# Patient Record
Sex: Male | Born: 1955
Health system: Southern US, Community
[De-identification: ages and names within clinical notes are randomized; demographics above are authoritative.]

## PROBLEM LIST (undated history)

## (undated) DIAGNOSIS — I2129 ST elevation (STEMI) myocardial infarction involving other sites: Secondary | ICD-10-CM

## (undated) DIAGNOSIS — I6529 Occlusion and stenosis of unspecified carotid artery: Secondary | ICD-10-CM

## (undated) DIAGNOSIS — I255 Ischemic cardiomyopathy: Secondary | ICD-10-CM

## (undated) DIAGNOSIS — E785 Hyperlipidemia, unspecified: Secondary | ICD-10-CM

## (undated) DIAGNOSIS — I2510 Atherosclerotic heart disease of native coronary artery without angina pectoris: Secondary | ICD-10-CM

## (undated) DIAGNOSIS — E119 Type 2 diabetes mellitus without complications: Secondary | ICD-10-CM

## (undated) DIAGNOSIS — I10 Essential (primary) hypertension: Secondary | ICD-10-CM

## (undated) DIAGNOSIS — I5042 Chronic combined systolic (congestive) and diastolic (congestive) heart failure: Secondary | ICD-10-CM

## (undated) HISTORY — DX: Ischemic cardiomyopathy: I25.5

## (undated) HISTORY — DX: Chronic combined systolic (congestive) and diastolic (congestive) heart failure: I50.42

## (undated) HISTORY — DX: Hyperlipidemia, unspecified: E78.5

## (undated) HISTORY — DX: Occlusion and stenosis of unspecified carotid artery: I65.29

## (undated) HISTORY — DX: Atherosclerotic heart disease of native coronary artery without angina pectoris: I25.10

---

## 2014-03-23 ENCOUNTER — Encounter (HOSPITAL_COMMUNITY)
Admission: EM | Disposition: A | Discharge status: Home / Self Care | Payer: Self-pay | Source: Ambulatory Visit | Attending: Cardiovascular Disease

## 2014-03-23 ENCOUNTER — Emergency Department: Payer: Self-pay | Admitting: Emergency Medicine

## 2014-03-23 ENCOUNTER — Encounter (HOSPITAL_COMMUNITY): Payer: Self-pay | Admitting: Cardiovascular Disease

## 2014-03-23 ENCOUNTER — Inpatient Hospital Stay (HOSPITAL_COMMUNITY)
Admission: EM | Admit: 2014-03-23 | Discharge: 2014-04-01 | DRG: 233 | Disposition: A | Discharge status: Home / Self Care | Payer: No Typology Code available for payment source | Source: Ambulatory Visit | Attending: Thoracic Surgery (Cardiothoracic Vascular Surgery) | Admitting: Thoracic Surgery (Cardiothoracic Vascular Surgery)

## 2014-03-23 DIAGNOSIS — D62 Acute posthemorrhagic anemia: Secondary | ICD-10-CM | POA: Diagnosis not present

## 2014-03-23 DIAGNOSIS — I5041 Acute combined systolic (congestive) and diastolic (congestive) heart failure: Secondary | ICD-10-CM | POA: Diagnosis present

## 2014-03-23 DIAGNOSIS — R0902 Hypoxemia: Secondary | ICD-10-CM | POA: Diagnosis present

## 2014-03-23 DIAGNOSIS — E119 Type 2 diabetes mellitus without complications: Secondary | ICD-10-CM

## 2014-03-23 DIAGNOSIS — Z794 Long term (current) use of insulin: Secondary | ICD-10-CM

## 2014-03-23 DIAGNOSIS — R29898 Other symptoms and signs involving the musculoskeletal system: Secondary | ICD-10-CM

## 2014-03-23 DIAGNOSIS — I2510 Atherosclerotic heart disease of native coronary artery without angina pectoris: Secondary | ICD-10-CM

## 2014-03-23 DIAGNOSIS — I2129 ST elevation (STEMI) myocardial infarction involving other sites: Secondary | ICD-10-CM | POA: Diagnosis present

## 2014-03-23 DIAGNOSIS — E1165 Type 2 diabetes mellitus with hyperglycemia: Secondary | ICD-10-CM | POA: Diagnosis present

## 2014-03-23 DIAGNOSIS — E871 Hypo-osmolality and hyponatremia: Secondary | ICD-10-CM | POA: Diagnosis present

## 2014-03-23 DIAGNOSIS — R05 Cough: Secondary | ICD-10-CM

## 2014-03-23 DIAGNOSIS — J9811 Atelectasis: Secondary | ICD-10-CM | POA: Diagnosis not present

## 2014-03-23 DIAGNOSIS — R0789 Other chest pain: Secondary | ICD-10-CM | POA: Diagnosis present

## 2014-03-23 DIAGNOSIS — I25110 Atherosclerotic heart disease of native coronary artery with unstable angina pectoris: Secondary | ICD-10-CM

## 2014-03-23 DIAGNOSIS — I237 Postinfarction angina: Secondary | ICD-10-CM | POA: Diagnosis not present

## 2014-03-23 DIAGNOSIS — I25118 Atherosclerotic heart disease of native coronary artery with other forms of angina pectoris: Secondary | ICD-10-CM | POA: Diagnosis present

## 2014-03-23 DIAGNOSIS — R059 Cough, unspecified: Secondary | ICD-10-CM

## 2014-03-23 DIAGNOSIS — Z951 Presence of aortocoronary bypass graft: Secondary | ICD-10-CM

## 2014-03-23 DIAGNOSIS — I309 Acute pericarditis, unspecified: Secondary | ICD-10-CM | POA: Diagnosis present

## 2014-03-23 DIAGNOSIS — I219 Acute myocardial infarction, unspecified: Secondary | ICD-10-CM

## 2014-03-23 DIAGNOSIS — R072 Precordial pain: Secondary | ICD-10-CM

## 2014-03-23 DIAGNOSIS — I252 Old myocardial infarction: Secondary | ICD-10-CM | POA: Diagnosis present

## 2014-03-23 HISTORY — DX: ST elevation (STEMI) myocardial infarction involving other sites: I21.29

## 2014-03-23 HISTORY — DX: Type 2 diabetes mellitus without complications: E11.9

## 2014-03-23 HISTORY — PX: LEFT HEART CATHETERIZATION WITH CORONARY ANGIOGRAM: SHX5451

## 2014-03-23 LAB — TROPONIN I
Troponin I: 6.78 ng/mL (ref <0.031)
Troponin I: 9.05 ng/mL (ref <0.031)
Troponin I: 8.68 ng/mL (ref <0.031)
Troponin-I: 11 ng/mL — ABNORMAL HIGH

## 2014-03-23 LAB — CBC
HCT: 44.1 % (ref 40.0–52.0)
HCT: 38 % — ABNORMAL LOW (ref 39.0–52.0)
HGB: 14.7 g/dL (ref 13.0–18.0)
Hemoglobin: 13.6 g/dL (ref 13.0–17.0)
MCH: 29.1 pg (ref 26.0–34.0)
MCH: 30 pg (ref 26.0–34.0)
MCHC: 33.3 g/dL (ref 32.0–36.0)
MCHC: 35.8 g/dL (ref 30.0–36.0)
MCV: 88 fL (ref 80–100)
MCV: 83.7 fL (ref 78.0–100.0)
Platelet: 207 10*3/uL (ref 150–440)
Platelets: 207 10*3/uL (ref 150–400)
RBC: 5.04 10*6/uL (ref 4.40–5.90)
RBC: 4.54 MIL/uL (ref 4.22–5.81)
RDW: 13.5 % (ref 11.5–14.5)
RDW: 12.8 % (ref 11.5–15.5)
WBC: 12.3 10*3/uL — AB (ref 3.8–10.6)
WBC: 11.7 10*3/uL — AB (ref 4.0–10.5)

## 2014-03-23 LAB — BASIC METABOLIC PANEL
Anion Gap: 11 (ref 7–16)
Anion gap: 13 (ref 5–15)
BUN: 14 mg/dL (ref 7–18)
BUN: 12 mg/dL (ref 6–23)
CALCIUM: 8.3 mg/dL — AB (ref 8.5–10.1)
CHLORIDE: 103 mmol/L (ref 96–112)
CO2: 18 mmol/L — AB (ref 19–32)
CREATININE: 0.89 mg/dL (ref 0.60–1.30)
CREATININE: 0.98 mg/dL (ref 0.50–1.35)
Calcium: 7.7 mg/dL — ABNORMAL LOW (ref 8.4–10.5)
Chloride: 102 mmol/L (ref 98–107)
Co2: 24 mmol/L (ref 21–32)
GFR calc Af Amer: >90 mL/min (ref >90)
GFR, EST NON AFRICAN AMERICAN: 89 mL/min — AB (ref >90)
GLUCOSE: 228 mg/dL — AB (ref 65–99)
Glucose, Bld: 237 mg/dL — ABNORMAL HIGH (ref 70–99)
Osmolality: 281 (ref 275–301)
POTASSIUM: 4 mmol/L (ref 3.5–5.1)
Potassium: 3.4 mmol/L — ABNORMAL LOW (ref 3.5–5.1)
Sodium: 137 mmol/L (ref 136–145)
Sodium: 134 mmol/L — ABNORMAL LOW (ref 135–145)

## 2014-03-23 LAB — DIFFERENTIAL
BASOS ABS: 0 10*3/uL (ref 0.0–0.1)
Basophils Relative: 0 % (ref 0–1)
Eosinophils Absolute: 0 10*3/uL (ref 0.0–0.7)
Eosinophils Relative: 0 % (ref 0–5)
Lymphocytes Relative: 10 % — ABNORMAL LOW (ref 12–46)
Lymphs Abs: 1.2 10*3/uL (ref 0.7–4.0)
Monocytes Absolute: 0.9 10*3/uL (ref 0.1–1.0)
Monocytes Relative: 8 % (ref 3–12)
Neutro Abs: 9.6 10*3/uL — ABNORMAL HIGH (ref 1.7–7.7)
Neutrophils Relative %: 82 % — ABNORMAL HIGH (ref 43–77)

## 2014-03-23 LAB — GLUCOSE, CAPILLARY
Glucose-Capillary: 228 mg/dL — ABNORMAL HIGH (ref 70–99)
Glucose-Capillary: 233 mg/dL — ABNORMAL HIGH (ref 70–99)
Glucose-Capillary: 266 mg/dL — ABNORMAL HIGH (ref 70–99)
Glucose-Capillary: 275 mg/dL — ABNORMAL HIGH (ref 70–99)

## 2014-03-23 LAB — BRAIN NATRIURETIC PEPTIDE: B NATRIURETIC PEPTIDE 5: 556 pg/mL — AB (ref 0.0–100.0)

## 2014-03-23 LAB — PRO B NATRIURETIC PEPTIDE: B-TYPE NATIURETIC PEPTID: 3418 pg/mL — AB (ref 0–125)

## 2014-03-23 LAB — MRSA PCR SCREENING: MRSA by PCR: NEGATIVE

## 2014-03-23 LAB — APTT
ACTIVATED PTT: 31.7 s (ref 23.6–35.9)
aPTT: 101 seconds — ABNORMAL HIGH (ref 24–37)

## 2014-03-23 LAB — TSH: TSH: 0.591 u[IU]/mL (ref 0.350–4.500)

## 2014-03-23 LAB — PROTIME-INR
INR: 1
INR: 1.19 (ref 0.00–1.49)
Prothrombin Time: 13.1 secs
Prothrombin Time: 15.2 seconds (ref 11.6–15.2)

## 2014-03-23 LAB — CK-MB: CK-MB: 15.5 ng/mL — ABNORMAL HIGH (ref 0.5–3.6)

## 2014-03-23 LAB — D-DIMER(ARMC): D-DIMER: 541 ng/mL

## 2014-03-23 LAB — HEPARIN LEVEL (UNFRACTIONATED): Heparin Unfractionated: <0.1 IU/mL — ABNORMAL LOW (ref 0.30–0.70)

## 2014-03-23 SURGERY — LEFT HEART CATHETERIZATION WITH CORONARY ANGIOGRAM
Anesthesia: LOCAL

## 2014-03-23 MED ORDER — SODIUM CHLORIDE 0.9 % IJ SOLN: 3.0000 mL | INTRAMUSCULAR | Status: DC | PRN

## 2014-03-23 MED ORDER — SODIUM CHLORIDE 0.9 % IV SOLN
INTRAVENOUS | Status: AC
Start: 2014-03-23 — End: 2014-03-23
  Administered 2014-03-23: 09:00:00 via INTRAVENOUS

## 2014-03-23 MED ORDER — METOPROLOL TARTRATE 12.5 MG HALF TABLET
12.5000 mg | ORAL_TABLET | Freq: Two times a day (BID) | ORAL | Status: DC
  Administered 2014-03-23 (×3): 12.5 mg via ORAL
  Filled 2014-03-23 (×4): qty 1

## 2014-03-23 MED ORDER — ONDANSETRON HCL 4 MG/2ML IJ SOLN: 4.0000 mg | Freq: Four times a day (QID) | INTRAMUSCULAR | Status: DC | PRN

## 2014-03-23 MED ORDER — INSULIN ASPART 100 UNIT/ML ~~LOC~~ SOLN
0.0000 [IU] | Freq: Three times a day (TID) | SUBCUTANEOUS | Status: DC
Start: 2014-03-23 — End: 2014-03-26
  Administered 2014-03-23 (×2): 5 [IU] via SUBCUTANEOUS
  Administered 2014-03-24: 8 [IU] via SUBCUTANEOUS
  Administered 2014-03-24: 11 [IU] via SUBCUTANEOUS
  Administered 2014-03-24: 15 [IU] via SUBCUTANEOUS
  Administered 2014-03-25: 8 [IU] via SUBCUTANEOUS
  Administered 2014-03-25 (×2): 5 [IU] via SUBCUTANEOUS
  Administered 2014-03-26: 8 [IU] via SUBCUTANEOUS

## 2014-03-23 MED ORDER — ALPRAZOLAM 0.5 MG PO TABS
1.0000 mg | ORAL_TABLET | Freq: Once | ORAL | Status: AC
  Administered 2014-03-24: 1 mg via ORAL
  Filled 2014-03-23: qty 2

## 2014-03-23 MED ORDER — FENTANYL CITRATE 0.05 MG/ML IJ SOLN
INTRAMUSCULAR | Status: AC
  Filled 2014-03-23: qty 2

## 2014-03-23 MED ORDER — VERAPAMIL HCL 2.5 MG/ML IV SOLN
INTRAVENOUS | Status: AC
  Filled 2014-03-23: qty 2

## 2014-03-23 MED ORDER — HEPARIN SODIUM (PORCINE) 1000 UNIT/ML IJ SOLN
INTRAMUSCULAR | Status: AC
  Filled 2014-03-23: qty 1

## 2014-03-23 MED ORDER — OXYCODONE-ACETAMINOPHEN 5-325 MG PO TABS
1.0000 | ORAL_TABLET | ORAL | Status: DC | PRN
  Administered 2014-03-23 – 2014-03-27 (×2): 2 via ORAL
  Filled 2014-03-23: qty 2

## 2014-03-23 MED ORDER — MORPHINE SULFATE 2 MG/ML IJ SOLN
2.0000 mg | INTRAMUSCULAR | Status: DC | PRN
  Administered 2014-03-23 – 2014-03-24 (×2): 2 mg via INTRAVENOUS
  Filled 2014-03-23 (×4): qty 1

## 2014-03-23 MED ORDER — ALPRAZOLAM 0.5 MG PO TABS
1.0000 mg | ORAL_TABLET | Freq: Three times a day (TID) | ORAL | Status: DC | PRN
  Administered 2014-03-23 – 2014-03-26 (×4): 1 mg via ORAL
  Filled 2014-03-23 (×4): qty 2

## 2014-03-23 MED ORDER — NITROGLYCERIN IN D5W 200-5 MCG/ML-% IV SOLN
0.0000 ug/min | INTRAVENOUS | Status: DC
  Administered 2014-03-23: 10 ug/min via INTRAVENOUS
  Administered 2014-03-24: 45 ug/min via INTRAVENOUS
  Administered 2014-03-25: 30 ug/min via INTRAVENOUS
  Filled 2014-03-23 (×4): qty 250

## 2014-03-23 MED ORDER — HEPARIN (PORCINE) IN NACL 100-0.45 UNIT/ML-% IJ SOLN: 1000.0000 [IU]/h | INTRAMUSCULAR | Status: DC

## 2014-03-23 MED ORDER — MIDAZOLAM HCL 2 MG/2ML IJ SOLN
INTRAMUSCULAR | Status: AC
  Filled 2014-03-23: qty 2

## 2014-03-23 MED ORDER — HEPARIN (PORCINE) IN NACL 100-0.45 UNIT/ML-% IJ SOLN
1900.0000 [IU]/h | INTRAMUSCULAR | Status: DC
  Administered 2014-03-23: 1300 [IU]/h via INTRAVENOUS
  Administered 2014-03-24: 1900 [IU]/h via INTRAVENOUS
  Filled 2014-03-23 (×4): qty 250

## 2014-03-23 MED ORDER — ASPIRIN EC 81 MG PO TBEC
81.0000 mg | DELAYED_RELEASE_TABLET | Freq: Every day | ORAL | Status: DC
  Administered 2014-03-24 – 2014-03-27 (×4): 81 mg via ORAL
  Filled 2014-03-23 (×5): qty 1

## 2014-03-23 MED ORDER — HEPARIN BOLUS VIA INFUSION
4000.0000 [IU] | Freq: Once | INTRAVENOUS | Status: DC
  Filled 2014-03-23: qty 4000

## 2014-03-23 MED ORDER — HEPARIN BOLUS VIA INFUSION
4000.0000 [IU] | Freq: Once | INTRAVENOUS | Status: AC
  Administered 2014-03-23: 4000 [IU] via INTRAVENOUS
  Filled 2014-03-23: qty 4000

## 2014-03-23 MED ORDER — SODIUM CHLORIDE 0.9 % IV SOLN: 250.0000 mL | INTRAVENOUS | Status: DC | PRN

## 2014-03-23 MED ORDER — NITROGLYCERIN 1 MG/10 ML FOR IR/CATH LAB
INTRA_ARTERIAL | Status: AC
  Filled 2014-03-23: qty 10

## 2014-03-23 MED ORDER — NITROGLYCERIN 0.4 MG SL SUBL
0.4000 mg | SUBLINGUAL_TABLET | SUBLINGUAL | Status: DC | PRN
  Administered 2014-03-23 (×2): 0.4 mg via SUBLINGUAL
  Filled 2014-03-23: qty 1

## 2014-03-23 MED ORDER — HEPARIN (PORCINE) IN NACL 2-0.9 UNIT/ML-% IJ SOLN
INTRAMUSCULAR | Status: AC
  Filled 2014-03-23: qty 1500

## 2014-03-23 MED ORDER — INSULIN DETEMIR 100 UNIT/ML ~~LOC~~ SOLN
10.0000 [IU] | Freq: Every day | SUBCUTANEOUS | Status: DC
  Administered 2014-03-23 – 2014-03-24 (×2): 10 [IU] via SUBCUTANEOUS
  Filled 2014-03-23 (×3): qty 0.1

## 2014-03-23 MED ORDER — LIDOCAINE HCL (PF) 1 % IJ SOLN
INTRAMUSCULAR | Status: AC
  Filled 2014-03-23: qty 30

## 2014-03-23 MED ORDER — HEPARIN (PORCINE) IN NACL 100-0.45 UNIT/ML-% IJ SOLN
1000.0000 [IU]/h | INTRAMUSCULAR | Status: DC
  Administered 2014-03-23: 1000 [IU]/h via INTRAVENOUS
  Filled 2014-03-23: qty 250

## 2014-03-23 MED ORDER — ACETAMINOPHEN 325 MG PO TABS: 650.0000 mg | ORAL_TABLET | ORAL | Status: DC | PRN

## 2014-03-23 MED ORDER — SODIUM CHLORIDE 0.9 % IJ SOLN
3.0000 mL | Freq: Two times a day (BID) | INTRAMUSCULAR | Status: DC
Start: 2014-03-23 — End: 2014-03-24
  Administered 2014-03-23: 3 mL via INTRAVENOUS

## 2014-03-23 MED ORDER — ATORVASTATIN CALCIUM 80 MG PO TABS
80.0000 mg | ORAL_TABLET | Freq: Every day | ORAL | Status: DC
  Administered 2014-03-23 – 2014-03-31 (×8): 80 mg via ORAL
  Filled 2014-03-23 (×11): qty 1

## 2014-03-23 NOTE — Progress Notes (Signed)
*  PRELIMINARY RESULTS* Echocardiogram 2D Echocardiogram has been performed.  Jeryl Columbia 03/23/2014, 10:34 AM

## 2014-03-23 NOTE — Progress Notes (Signed)
CRITICAL VALUE ALERT  Critical value received:  Trop 6.78  Date of notification:  03/23/14  Time of notification:  1000  Critical value read back:Yes.    Nurse who received alert:  Carlton Adam  MD notified (1st page):  Mcclean, dalton  Time of first page: 1000  MD notified (2nd page):  Time of second page:  Responding MD:  Jearld Pies, dalton  Time MD responded:  1000

## 2014-03-23 NOTE — H&P (Signed)
History and Physical  Patient ID: Schaefer Osburn MRN: [id number], SOB: 1955/07/06 59 y.o. Date of Encounter: 03/23/2014, 8:47 AM  Primary Physician: No primary care provider on file. Primary Cardiologist: new  Chief Complaint: Chest pain  HPI: 59 y.o. male w/ PMHx significant for long-standing diabetes who presented to [facility] on 03/23/2014 with complaints of substernal chest pain. The patient has had intermittent symptoms 1 week. He describes a burning and sharp discomfort in his chest. He did not seek medical attention until this morning when he presented to [facility] emergency department. Initially, he had ST segment depression concerning for inferior ischemia. At that time soon after his presentation around 0 4:30 AM he had mild chest discomfort. His chest pain worsened and a repeat EKG was done at oh 6:40 AM demonstrating 1-2 mm of lateral ST segment elevation. A code STEMI was called. The patient is transferred here directly for cardiac catheterization and possible primary PCI.  Other than diabetes, the patient has no medical problems. He denies any history of stroke or TIA. He's had no previous surgery. He has not been hospitalized.  Past Medical History  Diagnosis Date  . Acute MI, lateral wall, initial episode of care 03/23/2014  . Type 2 diabetes mellitus 03/23/2014     Surgical History: No past surgical history on file.   Home Meds: Prior to Admission medications   Medication Sig Start Date End Date Taking? Authorizing Provider  insulin NPH Human (HUMULIN N,NOVOLIN N) 100 UNIT/ML injection Inject 20 Units into the skin 2 (two) times daily.   Yes Historical Provider, MD  insulin regular (NOVOLIN R,HUMULIN R) 100 units/mL injection Inject 5 Units into the skin 2 (two) times daily.   Yes Historical Provider, MD  Multiple Vitamin (MULTIVITAMIN) tablet Take 1 tablet by mouth daily.   Yes Historical Provider, MD    Allergies: No Known  Allergies  History   Social History  . Marital Status: Single    Spouse Name: N/A    Number of Children: N/A  . Years of Education: N/A   Occupational History  . Not on file.   Social History Main Topics  . Smoking status: Not on file  . Smokeless tobacco: Not on file  . Alcohol Use: Not on file  . Drug Use: Not on file  . Sexual Activity: Not on file   Other Topics Concern  . Not on file   Social History Narrative   The patient is married. He has grown children. He is a lifelong nonsmoker. He works in Production designer, theatre/television/film.     Family History  Problem Relation Age of Onset  . Healthy Father     No history of CAD  . Healthy Mother     No history of CAD   The patient denies any family history of coronary artery disease  Review of Systems: General: negative for fever, night sweats or weight changes. Positive for chills. ENT: negative for rhinorrhea or epistaxis Cardiovascular: see HPI Dermatological: negative for rash Respiratory: negative for wheezing, positive for cough GI: negative for vomiting, diarrhea, bright red blood per rectum, melena, or hematemesis. Positive for nausea. GU: no hematuria, urgency, or frequency Neurologic: negative for visual changes, syncope, headache, or dizziness Heme: no easy bruising or bleeding Endo: negative for excessive thirst, thyroid disorder, or flushing Musculoskeletal: negative for joint pain or swelling, negative for myalgias All other systems reviewed and are otherwise negative except as noted above.  Physical Exam: Blood pressure  143/80, heart rate 85, respiratory rate 16, oxygen saturation 94% General: Well developed, well nourished, alert and oriented, in no acute distress. HEENT: Normocephalic, atraumatic, sclera anicteric Neck: Supple. Carotids 2+ without bruits. JVP normal Lungs: Clear bilaterally to auscultation without wheezes, rales, or rhonchi. Breathing is unlabored. Heart: RRR with normal S1 and S2. No murmurs, rubs, or  gallops appreciated. Abdomen: Soft, non-tender, non-distended with normoactive bowel sounds. No hepatomegaly. No rebound/guarding. No obvious abdominal masses. Back: No CVA tenderness Msk:  Strength and tone appear normal for age. Extremities: No clubbing, cyanosis, or edema.  Distal pedal pulses are 2+ and equal bilaterally. Neuro: CNII-XII intact, moves all extremities spontaneously. Psych:  Responds to questions appropriately with a normal affect. Skin: warm and dry without rash   Labs:  No results found for: WBC, HGB, HCT, MCV, PLT No results for input(s): NA, K, CL, CO2, BUN, CREATININE, CALCIUM, PROT, BILITOT, ALKPHOS, ALT, AST, GLUCOSE in the last 168 hours.  Invalid input(s): LABALBU No results for input(s): CKTOTAL, CKMB, TROPONINI in the last 72 hours. No results found for: CHOL, HDL, LDLCALC, TRIG No results found for: DDIMER  Radiology/Studies:  No results found.   EKG: Normal sinus rhythm with lateral injury pattern and mild diffuse ST depression  CARDIAC STUDIES: pending  ASSESSMENT AND PLAN:  59 year old man with long-standing diabetes presenting with acute coronary syndrome, EKG suggestive of acute lateral infarction. He has had stuttering symptoms for 1 week and now describes pleuritic pain as well as burning substernal pain. Evolving EKG changes raise suspicion for STEMI. Also possible that he is having postinfarction angina or even post MI per arthritis. Emergency cardiac catheterization and possible PCI are clearly indicated to better define his acute process. Emergency informed consent was obtained. The patient is brought directly to the cardiac catheterization lab for cath and possible PCI. Further plans and disposition pending those findings. He has been treated with aspirin and IV heparin. He otherwise appears hemodynamically stable at present. Routine post MI medical therapy will be initiated.  Signed, Tonny Bollman MD 03/23/2014, 8:47 AM

## 2014-03-23 NOTE — Progress Notes (Signed)
Chaplain responded to Code STEMI.  Patient had not yet arrived. Chaplain was on a different call when patient and family member arrived.  Per ED registration, family was taken to Cath lab waiting area. Chaplain checked there, checked with doctor. No family was found.  Please call if needed.    Debby Bud [place], [place] [phone]

## 2014-03-23 NOTE — Progress Notes (Deleted)
CARDIAC REHAB PHASE I   PRE:Rate/Rhythm: 81 SR  BP:Supine: Sitting: 159/60Standing:  SaO2: 96 RA  MODE: Ambulation: 250 ft  POST:Rate/Rhythm: 96 SR  BP:Supine: Sitting: 195/63 R; 87/57 L verified x 2 (auto cuff)Standing:  SaO2: 97 SR  Pt ambulated 250 feet x 1 assist. Offered walker to use pt declined - reminded her of her sister who passed away with CVA. Pt noted to wobble back and forth sideway with walk but remained in control. Pt back to chair. Bp rechecked high post ambulation walk rechecked in left arm - pt stated it is always better in the left arm. bp 87/57 auto cuff verified x 2 check. Primary RN made aware of discrepancy. Pt denies any complaints at this time, call bell in place.[phone] Luana Tatro RN, BSN           

## 2014-03-23 NOTE — Progress Notes (Signed)
ANTICOAGULATION CONSULT NOTE - Follow-Up Consult  Pharmacy Consult for Heparin Indication: chest pain/ACS  No Known Allergies  Patient Measurements: Height: 5\' 8"  (172.7 cm) Weight: 201 lb 1 oz (91.2 kg) IBW/kg (Calculated) : 68.4 Heparin Dosing Weight: 87.2 kg  Vital Signs: Temp: 99.3 F (37.4 C) (02/06 2000) Temp Source: Oral (02/06 2000) BP: 129/63 mmHg (02/06 2200) Pulse Rate: 87 (02/06 2200)  Labs:  Recent Labs  03/23/14 0903 03/23/14 1600 03/23/14 2110  HGB 13.6  --   --   HCT 38.0*  --   --   PLT 207  --   --   APTT 101*  --   --   LABPROT 15.2  --   --   INR 1.19  --   --   HEPARINUNFRC  --   --  <0.10*  CREATININE 0.98  --   --   TROPONINI 6.78* 9.05* 8.68*    Estimated Creatinine Clearance: 90.1 mL/min (by C-G formula based on Cr of 0.98).   Medical History: Past Medical History  Diagnosis Date  . Acute MI, lateral wall, initial episode of care 03/23/2014  . Type 2 diabetes mellitus 03/23/2014    Assessment: 59yo male arrived via EMS as code STEMI, taken immediately to cath lab for possible PCI.  Pt reported intermittent substernal chest pain x1 week.  Pharmacy consulted to dose heparin for ACS/chest pain.  CBC and PLT WNL, no bleeding noted  Initial heparin level subtherapeutic on IV heparin 1000 units/hr.    Goal of Therapy:  Heparin level 0.3-0.7 units/ml Monitor platelets by anticoagulation protocol: Yes   Plan:  Increase heparin to 1300 units/hr. Recheck heparin level with AM labs. Monitor daily HL, CBC, s/sx bleeding  Tad Moore, BCPS  Clinical Pharmacist Pager [phone]  03/23/2014 10:23 PM

## 2014-03-23 NOTE — Progress Notes (Addendum)
ANTICOAGULATION CONSULT NOTE - Initial Consult  Pharmacy Consult for Heparin Indication: chest pain/ACS  No Known Allergies  Patient Measurements: Height: 5\' 8"  (172.7 cm) Weight: 201 lb 1 oz (91.2 kg) IBW/kg (Calculated) : 68.4 Heparin Dosing Weight: 87.2 kg  Vital Signs: Pulse Rate: 83 (02/06 0751)  Labs:  Recent Labs  03/23/14 0903  HGB 13.6  HCT 38.0*  PLT 207    CrCl cannot be calculated (Patient has no serum creatinine result on file.).   Medical History: Past Medical History  Diagnosis Date  . Acute MI, lateral wall, initial episode of care 03/23/2014  . Type 2 diabetes mellitus 03/23/2014    Assessment: 59yo male arrived via EMS as code STEMI, taken immediately to cath lab for possible PCI.  Pt reported intermittent substernal chest pain x1 week.  Pharmacy consulted to dose heparin for ACS/chest pain.  CBC and PLT WNL, no bleeding noted  Goal of Therapy:  Heparin level 0.3-0.7 units/ml Monitor platelets by anticoagulation protocol: Yes   Plan:  Heparin 4000 unit bolus IV - 2hr after TR band removed Heparin gtt @ 1000 units/hr IV Check 6hr heparin level Monitor daily HL, CBC, s/sx bleeding  Waynette Buttery, PharmD Clinical Pharmacy Resident Pager: [phone] 03/23/2014 10:04 AM

## 2014-03-23 NOTE — Consult Note (Signed)
Reason for Consult:3 vessel CAD s/p MI Referring Physician: Dr. Gerarda Fraction Danny [name] is an 59 y.o. male.  HPI: chief complaint is chest pain.  Mr. Danny [name] is a 59 yo man with type II diabetes who presented this morning with a cc/o chest pain. He denies hypertension, hyperlipidemia, is a nonsmoker and has no family history of CAD. He has no prior history of heart trouble. He started having substernal chest pressure and burning pain about a week ago. The pain waxed and waned, but was progressively getting worse with each episode. Finally this morning it was so bad he presented to [facility] ED. He had ST depression concerning for inferior ischemia. While there his pain worsened and a repeat ECG showed lateral ST elevation. He was transferred to the [facility] cath lab as a code STEMI. Dr. Burt Knack did a cath which showed 3 vessel CAD. The diagonal was the likely culprit vessel. There was no indication for angioplasty. He is currently pain free. The LV gram did show anterolateral hypokinesis.   Past Medical History  Diagnosis Date  . Acute MI, lateral wall, initial episode of care 03/23/2014  . Type 2 diabetes mellitus 03/23/2014    No past surgical history on file.  Family History  Problem Relation Age of Onset  . Healthy Father     No history of CAD  . Healthy Mother     No history of CAD    Social History:  reports that he has never smoked. He does not have any smokeless tobacco history on file. His alcohol and drug histories are not on file.  Allergies: No Known Allergies  Medications:  Scheduled: . [START ON 03/24/2014] aspirin EC  81 mg Oral Daily  . atorvastatin  80 mg Oral q1800  . insulin aspart  0-15 Units Subcutaneous TID WC  . insulin detemir  10 Units Subcutaneous QHS  . metoprolol tartrate  12.5 mg Oral BID  . sodium chloride  3 mL Intravenous Q12H    Results for orders placed or performed during the hospital encounter of 03/23/14 (from the past 48 hour(s))  Brain  natriuretic peptide     Status: Abnormal   Collection Time: 03/23/14  9:00 AM  Result Value Ref Range   B Natriuretic Peptide 556.0 (H) 0.0 - 100.0 pg/mL  Troponin I-(serum)     Status: Abnormal   Collection Time: 03/23/14  9:03 AM  Result Value Ref Range   Troponin I 6.78 (HH) <0.031 ng/mL    Comment:        POSSIBLE MYOCARDIAL ISCHEMIA. SERIAL TESTING RECOMMENDED. REPEATED TO VERIFY CRITICAL RESULT CALLED TO, READ BACK BY AND VERIFIED WITH: [facility] RN AT [id number] 03/23/14 BY [place]   CBC     Status: Abnormal   Collection Time: 03/23/14  9:03 AM  Result Value Ref Range   WBC 11.7 (H) 4.0 - 10.5 K/uL   RBC 4.54 4.22 - 5.81 MIL/uL   Hemoglobin 13.6 13.0 - 17.0 g/dL   HCT 38.0 (L) 39.0 - 52.0 %   MCV 83.7 78.0 - 100.0 fL   MCH 30.0 26.0 - 34.0 pg   MCHC 35.8 30.0 - 36.0 g/dL   RDW 12.8 11.5 - 15.5 %   Platelets 207 150 - 400 K/uL  Differential     Status: Abnormal   Collection Time: 03/23/14  9:03 AM  Result Value Ref Range   Neutrophils Relative % 82 (H) 43 - 77 %   Neutro Abs 9.6 (H) 1.7 - 7.7  K/uL   Lymphocytes Relative 10 (L) 12 - 46 %   Lymphs Abs 1.2 0.7 - 4.0 K/uL   Monocytes Relative 8 3 - 12 %   Monocytes Absolute 0.9 0.1 - 1.0 K/uL   Eosinophils Relative 0 0 - 5 %   Eosinophils Absolute 0.0 0.0 - 0.7 K/uL   Basophils Relative 0 0 - 1 %   Basophils Absolute 0.0 0.0 - 0.1 K/uL  Protime-INR     Status: None   Collection Time: 03/23/14  9:03 AM  Result Value Ref Range   Prothrombin Time 15.2 11.6 - 15.2 seconds   INR 1.19 0.00 - 1.49  APTT     Status: Abnormal   Collection Time: 03/23/14  9:03 AM  Result Value Ref Range   aPTT 101 (H) 24 - 37 seconds    Comment:        IF BASELINE aPTT IS ELEVATED, SUGGEST PATIENT RISK ASSESSMENT BE USED TO DETERMINE APPROPRIATE ANTICOAGULANT THERAPY.   Basic metabolic panel     Status: Abnormal   Collection Time: 03/23/14  9:03 AM  Result Value Ref Range   Sodium 134 (L) 135 - 145 mmol/L   Potassium 3.4 (L) 3.5 -  5.1 mmol/L   Chloride 103 96 - 112 mmol/L   CO2 18 (L) 19 - 32 mmol/L   Glucose, Bld 237 (H) 70 - 99 mg/dL   BUN 12 6 - 23 mg/dL   Creatinine, Ser 0.98 0.50 - 1.35 mg/dL   Calcium 7.7 (L) 8.4 - 10.5 mg/dL   GFR calc non Af Amer 89 (L) >90 mL/min   GFR calc Af Amer >90 >90 mL/min    Comment: (NOTE) The eGFR has been calculated using the CKD EPI equation. This calculation has not been validated in all clinical situations. eGFR's persistently <90 mL/min signify possible Chronic Kidney Disease.    Anion gap 13 5 - 15  TSH     Status: None   Collection Time: 03/23/14  9:03 AM  Result Value Ref Range   TSH 0.591 0.350 - 4.500 uIU/mL  MRSA PCR Screening     Status: None   Collection Time: 03/23/14 10:43 AM  Result Value Ref Range   MRSA by PCR NEGATIVE NEGATIVE    Comment:        The GeneXpert MRSA Assay (FDA approved for NASAL specimens only), is one component of a comprehensive MRSA colonization surveillance program. It is not intended to diagnose MRSA infection nor to guide or monitor treatment for MRSA infections.   Glucose, capillary     Status: Abnormal   Collection Time: 03/23/14 11:52 AM  Result Value Ref Range   Glucose-Capillary 228 (H) 70 - 99 mg/dL   Comment 1 Capillary Sample     No results found.  Review of Systems  Constitutional: Positive for chills. Negative for fever.  Respiratory: Positive for cough.   Cardiovascular: Positive for chest pain.  Gastrointestinal: Positive for nausea.  All other systems reviewed and are negative.  Blood pressure 161/72, pulse 91, temperature 99.3 F (37.4 C), temperature source Oral, resp. rate 22, height _0  (1.727 m), weight 201 lb 1 oz (91.2 kg), SpO2 96 %. Physical Exam  Vitals reviewed. Constitutional: He is oriented to person, place, and time. He appears well-developed and well-nourished. No distress.  HENT:  Head: Normocephalic and atraumatic.  Eyes: EOM are normal. Pupils are equal, round, and reactive to  light.  Neck: Neck supple. No thyromegaly present.  No carotid bruits  Cardiovascular: Normal rate, regular rhythm, normal heart sounds and intact distal pulses.   No murmur heard. Normal Allen's test left radial  Respiratory: Effort normal and breath sounds normal. He has no wheezes.  GI: Soft. Bowel sounds are normal. There is no tenderness.  Musculoskeletal: He exhibits no edema.  Lymphadenopathy:    He has no cervical adenopathy.  Neurological: He is alert and oriented to person, place, and time.  Skin: Skin is warm and dry.    CARDIAC CATHETERIZATION Procedural Findings:  Hemodynamics:  AO 136/73  LV 137/26  Coronary angiography:  Coronary dominance: right  Left mainstem: The left mainstem is patent. The vessel is diffusely calcified with minor irregularity but no significant stenoses are noted.  Left anterior descending (LAD): The LAD is diffusely diseased. The proximal vessel at the ostium has 50% hypodense stenosis. There is an eccentric plaque in the mid vessel just after the origin of the first diagonal with 75-80% stenosis. Further down in the mid and distal LAD there is diffuse coronary obstruction without high-grade stenosis. The first diagonal trifurcates into 3 small subbranch is. The vessel is diffusely diseased. The ostium has 90% stenosis. In some angles this appears hypodense and I suspect this is the patient's culprit vessel.  Left circumflex (LCx): The entire circumflex distribution is diffusely diseased. The proximal circumflex has 90% stenosis. The first obtuse marginal is very small in caliber and it is diffusely diseased with 80% stenosis. The second obtuse marginal is medium in caliber and also has diffuse disease with 80% stenosis.  Right coronary artery (RCA): Large, dominant vessel. The proximal, mid, and distal RCA have minor irregularity but no significant stenosis. The PDA branch is diffusely diseased with 80% stenosis in the mid and distal vessel. The PLA  branches are patent.  Left ventriculography: The anterolateral wall is akinetic. The LV apex contracts normally. The basal anterior wall contracts normally. The inferior wall contracts normally. The estimated LVEF is 40-45%. There is no significant mitral regurgitation appreciated.  Estimated Blood Loss: minimal  Final Conclusions:  1. Severe multivessel coronary artery disease with severe stenosis of the proximal circumflex, mid LAD, proximal first diagonal branch, and right PDA  2. Acute lateral wall infarction with anterolateral akinesis, suspected diagonal territory infarct with late clinical presentation  3. Moderate segmental LV systolic dysfunction with estimated LVEF 40-45%   I personally reviewed the cath films in the cath lab with Dr. Burt Knack and was involved in the decision making process.  Assessment/Plan: 59 yo man with diabetes who presents after an out of hospital MI with unstable postinfarct angina. He has 3 vessel CAD and impaired LV function with an EF of 40-45%.  CABG is indicated for survival benefit and relief of symptoms.  I discussed the general nature of the procedure, the need for general anesthesia, and the incisions to be used. We discussed possibly using the left radial artery. I discussed the expected hospital stay, overall recovery and short and long term outcomes. I reviewed the indications, risks, benefits and alternatives. He understands the risks include but are not limited to death, stroke, MI, DVT/PE, bleeding, possible need for transfusion, infections, arrhythmias, and other organ system dysfunction including respiratory, renal, or GI complications. He accepts the risks and agrees to proceed.  Will plan to proceed next week. Currently appears OR availability limited to later in week.   Kamali Sakata C 03/23/2014, 3:46 PM

## 2014-03-23 NOTE — Progress Notes (Signed)
Consult received.  Pt in bed finishing his lunch.  Oriented pt to cardiac rehab phase I role during his hospitalization.  Pt remains on BR with BRP. Will begin ambulation with pt when appropriate.  Reviewed with pt heart attack booklet.  In anticipation of heart surgery pt also given heart surgery booklet. Reviewed with pt standing without use of arms, deep breathing and increased activity as tolerated and when appropriate.  Pt offered to watch pt video on patient education network. Pt declined at present.  Phone number and video given to pt to view when his wife returns to the hospital.  General questions answered.  Will continue to follow and progress activity as able. Alanson Aly, BSN

## 2014-03-23 NOTE — CV Procedure (Signed)
Cardiac Catheterization Procedure Note  Name: Danny [name] MRN: [id number] DOB: [birth year]  Procedure: Left Heart Cath, Selective Coronary Angiography, LV angiography  Indication: Lateral STEMI. 59 year old long-standing insulin-requiring diabetic presents with one-week of stuttering chest pain. Symptoms worsened this morning. He had evolving changes with concerns for acute lateral infarction and was transferred emergently as a code STEMI from [facility] directed to the cardiac catheterization lab.   Procedural Details: The right wrist was prepped, draped, and anesthetized with 1% lidocaine. Using the modified Seldinger technique, a 5/6 French Slender sheath was introduced into the right radial artery. 3 mg of verapamil was administered through the sheath, weight-based unfractionated heparin was administered intravenously. Standard Judkins catheters were used for selective coronary angiography and left ventriculography. Catheter exchanges were performed over an exchange length guidewire. The procedure was difficult because of marked subclavian tortuosity. There were no immediate procedural complications. A TR band was used for radial hemostasis at the completion of the procedure.  The patient was transferred to the post catheterization recovery area for further monitoring.  Procedural Findings: Hemodynamics: AO 136/73 LV 137/26  Coronary angiography: Coronary dominance: right  Left mainstem: The left mainstem is patent. The vessel is diffusely calcified with minor irregularity but no significant stenoses are noted.  Left anterior descending (LAD): The LAD is diffusely diseased. The proximal vessel at the ostium has 50% hypodense stenosis. There is an eccentric plaque in the mid vessel just after the origin of the first diagonal with 75-80% stenosis. Further down in the mid and distal LAD there is diffuse coronary obstruction without high-grade stenosis. The  first diagonal trifurcates into 3 small subbranch is. The vessel is diffusely diseased. The ostium has 90% stenosis. In some angles this appears hypodense and I suspect this is the patient's culprit vessel.  Left circumflex (LCx): The entire circumflex distribution is diffusely diseased. The proximal circumflex has 90% stenosis. The first obtuse marginal is very small in caliber and it is diffusely diseased with 80% stenosis. The second obtuse marginal is medium in caliber and also has diffuse disease with 80% stenosis.  Right coronary artery (RCA): Large, dominant vessel. The proximal, mid, and distal RCA have minor irregularity but no significant stenosis. The PDA branch is diffusely diseased with 80% stenosis in the mid and distal vessel. The PLA branches are patent.  Left ventriculography: The anterolateral wall is akinetic. The LV apex contracts normally. The basal anterior wall contracts normally. The inferior wall contracts normally. The estimated LVEF is 40-45%. There is no significant mitral regurgitation appreciated.  Estimated Blood Loss: minimal  Final Conclusions:   1. Severe multivessel coronary artery disease with severe stenosis of the proximal circumflex, mid LAD, proximal first diagonal branch, and right PDA 2. Acute lateral wall infarction with anterolateral akinesis, suspected diagonal territory infarct with late clinical presentation 3. Moderate segmental LV systolic dysfunction with estimated LVEF 40-45%  Recommendations: The patient has long-standing insulin-requiring diabetes and diffuse three-vessel coronary artery disease. I think he is late into the course of his myocardial infarction with significant troponin elevation, pleuritic chest pain, and a dense wall motion abnormality on ventriculography. At the completion of the procedure, he is chest pain-free. I do not see any targets that are technically amenable to PCI. I think he would be better treated with surgical  revascularization once he recovers from his myocardial infarction. Dr. Dorris Fetch was consulted and he came to the Cath Lab and reviewed the patient's films. He will do a formal consultation to  follow. The patient's targets are not optimal, but he will likely be a candidate for CABG. Will resume IV heparin after his sheath is out. Will check a stat 2-D echocardiogram to rule out paracardial effusion.  Tonny Bollman MD, [facility] 03/23/2014, 8:53 AM

## 2014-03-24 ENCOUNTER — Inpatient Hospital Stay (HOSPITAL_COMMUNITY): Payer: No Typology Code available for payment source

## 2014-03-24 DIAGNOSIS — I2129 ST elevation (STEMI) myocardial infarction involving other sites: Principal | ICD-10-CM

## 2014-03-24 DIAGNOSIS — I5031 Acute diastolic (congestive) heart failure: Secondary | ICD-10-CM

## 2014-03-24 LAB — LIPID PANEL
Cholesterol: 144 mg/dL (ref 0–200)
HDL: 47 mg/dL (ref >39)
LDL Cholesterol: 81 mg/dL (ref 0–99)
Total CHOL/HDL Ratio: 3.1 RATIO
Triglycerides: 81 mg/dL (ref <150)
VLDL: 16 mg/dL (ref 0–40)

## 2014-03-24 LAB — GLUCOSE, CAPILLARY
GLUCOSE-CAPILLARY: 393 mg/dL — AB (ref 70–99)
GLUCOSE-CAPILLARY: 289 mg/dL — AB (ref 70–99)
Glucose-Capillary: 320 mg/dL — ABNORMAL HIGH (ref 70–99)
Glucose-Capillary: 277 mg/dL — ABNORMAL HIGH (ref 70–99)

## 2014-03-24 LAB — HEPARIN LEVEL (UNFRACTIONATED)
Heparin Unfractionated: <0.1 IU/mL — ABNORMAL LOW (ref 0.30–0.70)
Heparin Unfractionated: 0.19 IU/mL — ABNORMAL LOW (ref 0.30–0.70)
Heparin Unfractionated: 0.29 IU/mL — ABNORMAL LOW (ref 0.30–0.70)

## 2014-03-24 LAB — BASIC METABOLIC PANEL
Anion gap: 14 (ref 5–15)
BUN: 16 mg/dL (ref 6–23)
CALCIUM: 8.1 mg/dL — AB (ref 8.4–10.5)
CO2: 20 mmol/L (ref 19–32)
CREATININE: 1.26 mg/dL (ref 0.50–1.35)
Chloride: 99 mmol/L (ref 96–112)
GFR calc non Af Amer: 61 mL/min — ABNORMAL LOW (ref >90)
GFR, EST AFRICAN AMERICAN: 71 mL/min — AB (ref >90)
Glucose, Bld: 314 mg/dL — ABNORMAL HIGH (ref 70–99)
Potassium: 3.7 mmol/L (ref 3.5–5.1)
SODIUM: 133 mmol/L — AB (ref 135–145)

## 2014-03-24 LAB — CBC
HCT: 38.4 % — ABNORMAL LOW (ref 39.0–52.0)
HEMOGLOBIN: 13.4 g/dL (ref 13.0–17.0)
MCH: 29.8 pg (ref 26.0–34.0)
MCHC: 34.9 g/dL (ref 30.0–36.0)
MCV: 85.3 fL (ref 78.0–100.0)
PLATELETS: 219 10*3/uL (ref 150–400)
RBC: 4.5 MIL/uL (ref 4.22–5.81)
RDW: 13 % (ref 11.5–15.5)
WBC: 12.3 10*3/uL — ABNORMAL HIGH (ref 4.0–10.5)

## 2014-03-24 MED ORDER — POTASSIUM CHLORIDE CRYS ER 20 MEQ PO TBCR
40.0000 meq | EXTENDED_RELEASE_TABLET | Freq: Once | ORAL | Status: AC
  Administered 2014-03-24: 40 meq via ORAL
  Filled 2014-03-24: qty 2

## 2014-03-24 MED ORDER — FUROSEMIDE 10 MG/ML IJ SOLN
40.0000 mg | Freq: Two times a day (BID) | INTRAMUSCULAR | Status: DC
  Administered 2014-03-24 – 2014-03-25 (×3): 40 mg via INTRAVENOUS
  Filled 2014-03-24 (×5): qty 4

## 2014-03-24 MED ORDER — HEPARIN (PORCINE) IN NACL 100-0.45 UNIT/ML-% IJ SOLN
2000.0000 [IU]/h | INTRAMUSCULAR | Status: DC
  Filled 2014-03-24 (×3): qty 250

## 2014-03-24 MED ORDER — LISINOPRIL 2.5 MG PO TABS
2.5000 mg | ORAL_TABLET | Freq: Every day | ORAL | Status: DC
  Administered 2014-03-24 – 2014-03-25 (×2): 2.5 mg via ORAL
  Filled 2014-03-24 (×2): qty 1

## 2014-03-24 MED ORDER — INSULIN ASPART 100 UNIT/ML ~~LOC~~ SOLN
4.0000 [IU] | Freq: Three times a day (TID) | SUBCUTANEOUS | Status: DC
  Administered 2014-03-24 – 2014-03-25 (×3): 4 [IU] via SUBCUTANEOUS

## 2014-03-24 MED ORDER — ALUM & MAG HYDROXIDE-SIMETH 200-200-20 MG/5ML PO SUSP
30.0000 mL | ORAL | Status: DC | PRN
  Administered 2014-03-24: 30 mL via ORAL
  Filled 2014-03-24: qty 30

## 2014-03-24 MED ORDER — CARVEDILOL 6.25 MG PO TABS
6.2500 mg | ORAL_TABLET | Freq: Two times a day (BID) | ORAL | Status: DC
  Administered 2014-03-24 – 2014-03-27 (×8): 6.25 mg via ORAL
  Filled 2014-03-24 (×11): qty 1

## 2014-03-24 NOTE — Progress Notes (Signed)
EKG CRITICAL VALUE     12 lead EKG performed.  Critical value noteGeorgia Hodgin, RN notified.   Jabori Henegar L, CCT 03/24/2014 7:42 AM

## 2014-03-24 NOTE — Progress Notes (Signed)
ANTICOAGULATION CONSULT NOTE - Follow-Up Consult  Pharmacy Consult for Heparin Indication: chest pain/ACS  No Known Allergies  Patient Measurements: Height: 5\' 8"  (172.7 cm) Weight: 205 lb 0.4 oz (93 kg) IBW/kg (Calculated) : 68.4 Heparin Dosing Weight: 87.2 kg  Vital Signs: Temp: 100.9 F (38.3 C) (02/07 1940) Temp Source: Oral (02/07 1940) BP: 122/70 mmHg (02/07 2000) Pulse Rate: 97 (02/07 2000)  Labs:  Recent Labs  03/23/14 0903 03/23/14 1600  03/23/14 2110 03/24/14 0314 03/24/14 1233 03/24/14 2010  HGB 13.6  --   --   --  13.4  --   --   HCT 38.0*  --   --   --  38.4*  --   --   PLT 207  --   --   --  219  --   --   APTT 101*  --   --   --   --   --   --   LABPROT 15.2  --   --   --   --   --   --   INR 1.19  --   --   --   --   --   --   HEPARINUNFRC  --   --   < > <0.10* <0.10* 0.19* 0.29*  CREATININE 0.98  --   --   --  1.26  --   --   TROPONINI 6.78* 9.05*  --  8.68*  --   --   --   < > = values in this interval not displayed.  Estimated Creatinine Clearance: 70.7 mL/min (by C-G formula based on Cr of 1.26).   Medical History: Past Medical History  Diagnosis Date  . Acute MI, lateral wall, initial episode of care 03/23/2014  . Type 2 diabetes mellitus 03/23/2014    Assessment: 59yo male arrived via EMS as code STEMI, taken immediately to cath lab for possible PCI.  Pt reported intermittent substernal chest pain x1 week.  Pharmacy consulted to dose heparin for ACS/chest pain.  CBC and PLT WNL, no bleeding noted  Heparin level remains slightly subtherapeutic on IV heparin 1900 units/hr.   Goal of Therapy:  Heparin level 0.3-0.7 units/ml Monitor platelets by anticoagulation protocol: Yes   Plan:  Increase heparin to 2000 units/hr Recheck heparin level with AM labs. Monitor daily HL, CBC, s/sx bleeding  Tad Moore, BCPS  Clinical Pharmacist Pager [phone]  03/24/2014 9:11 PM

## 2014-03-24 NOTE — Progress Notes (Signed)
Patient ID: Danny [name], male   DOB: [birth year], 59 y.o.   MRN: [id number]   SUBJECTIVE: No chest pain but does not feel good this morning.  Dyspneic with walking to bathroom and oxygen saturation is 89% on 4 L [place]. Remains on NTG gtt.   Scheduled Meds: . aspirin EC  81 mg Oral Daily  . atorvastatin  80 mg Oral q1800  . carvedilol  6.25 mg Oral BID WC  . furosemide  40 mg Intravenous BID  . insulin aspart  0-15 Units Subcutaneous TID WC  . insulin detemir  10 Units Subcutaneous QHS  . lisinopril  2.5 mg Oral Daily  . potassium chloride  40 mEq Oral Once   Continuous Infusions: . heparin 1,600 Units/hr (03/24/14 0539)  . nitroGLYCERIN 45 mcg/min (03/24/14 0100)   PRN Meds:.acetaminophen, ALPRAZolam, alum & mag hydroxide-simeth, morphine injection, nitroGLYCERIN, ondansetron (ZOFRAN) IV, oxyCODONE-acetaminophen    Filed Vitals:   03/24/14 0630 03/24/14 0700 03/24/14 0800 03/24/14 0900  BP: 118/63 127/62 128/70 137/83  Pulse: 95 96 89 103  Temp:   99 F (37.2 C)   TempSrc:   Oral   Resp:    18  Height:      Weight:      SpO2: 95% 94% 96% 94%    Intake/Output Summary (Last 24 hours) at 03/24/14 0921 Last data filed at 03/24/14 0900  Gross per 24 hour  Intake   1927 ml  Output   1680 ml  Net    247 ml    LABS: Basic Metabolic Panel:  Recent Labs  71/16/57 0903 03/24/14 0314  NA 134* 133*  K 3.4* 3.7  CL 103 99  CO2 18* 20  GLUCOSE 237* 314*  BUN 12 16  CREATININE 0.98 1.26  CALCIUM 7.7* 8.1*   Liver Function Tests: No results for input(s): AST, ALT, ALKPHOS, BILITOT, PROT, ALBUMIN in the last 72 hours. No results for input(s): LIPASE, AMYLASE in the last 72 hours. CBC:  Recent Labs  03/23/14 0903 03/24/14 0314  WBC 11.7* 12.3*  NEUTROABS 9.6*  --   HGB 13.6 13.4  HCT 38.0* 38.4*  MCV 83.7 85.3  PLT 207 219   Cardiac Enzymes:  Recent Labs  03/23/14 0903 03/23/14 1600 03/23/14 2110  TROPONINI 6.78* 9.05* 8.68*   BNP: Invalid  input(s): POCBNP D-Dimer: No results for input(s): DDIMER in the last 72 hours. Hemoglobin A1C: No results for input(s): HGBA1C in the last 72 hours. Fasting Lipid Panel:  Recent Labs  03/24/14 0314  CHOL 144  HDL 47  LDLCALC 81  TRIG 81  CHOLHDL 3.1   Thyroid Function Tests:  Recent Labs  03/23/14 0903  TSH 0.591   Anemia Panel: No results for input(s): VITAMINB12, FOLATE, FERRITIN, TIBC, IRON, RETICCTPCT in the last 72 hours.  RADIOLOGY: Portable Chest X-ray 1 View  03/24/2014   CLINICAL DATA:  Acute MI, status post left heart catheterization  EXAM: PORTABLE CHEST - 1 VIEW  COMPARISON:  03/23/2014  FINDINGS: Mild bibasilar atelectasis. No focal consolidation. Pulmonary vascular congestion without frank interstitial edema. No pleural effusion or pneumothorax.  The heart is top-normal in size.  IMPRESSION: Mild bibasilar atelectasis.   Electronically Signed   By: Charline Bills M.D.   On: 03/24/2014 07:17    PHYSICAL EXAM General: NAD Neck: JVP 12 cm, no thyromegaly or thyroid nodule.  Lungs: Crackles at bases bilaterally CV: Nondisplaced PMI.  Heart regular S1/S2, no S3/S4, no murmur.  No peripheral edema.   Abdomen:  Soft, nontender, no hepatosplenomegaly, no distention.  Neurologic: Alert and oriented x 3.  Psych: Normal affect. Extremities: No clubbing or cyanosis.   TELEMETRY: Reviewed telemetry pt in NSR 90s-100s  ASSESSMENT AND PLAN: 59 yo with history of diabetes (nonsmoker) presented with lateral STEMI.  1. CAD: Lateral STEMI on ECG but had had stuttering CP for > 1 week, likely subacute at presentation. Cath yesterday showed severe 3VD, large diagonal was likely culprit.  Not good targets for intervention.  Seen by Dr Dorris Fetch, plan for CABG.  Yesterday, had pleuritic chest pain concerning for post-infarct pericarditis, but this has resolved.  - Continue ASA 81, heparin gtt, statin.  - Change metoprolol to Coreg and will add low dose lisinopril 2.5 mg  daily.  2. Acute primarily diastolic CHF: EF 16% by echo, 10-96% by LV-gram.  He is volume overloaded on exam with hypoxia (89% on 4 L [place]).  He denies any past smoking.   - Start Lasix 40 mg IV bid, 1st dose now.  3. Diabetes: Will consult diabetes coordinator.   Marca Ancona 03/24/2014 9:26 AM

## 2014-03-24 NOTE — Progress Notes (Signed)
ANTICOAGULATION CONSULT NOTE - Follow Up Consult  Pharmacy Consult for Heparin  Indication: chest pain/ACS, s/p cath awaiting CABG  No Known Allergies  Patient Measurements: Height: 5\' 8"  (172.7 cm) Weight: 205 lb 0.4 oz (93 kg) IBW/kg (Calculated) : 68.4  Vital Signs: Temp: 99.6 F (37.6 C) (02/07 0400) Temp Source: Oral (02/07 0400) BP: 127/67 mmHg (02/07 0230) Pulse Rate: 93 (02/07 0230)  Labs:  Recent Labs  03/23/14 0903 03/23/14 1600 03/23/14 2110 03/24/14 0314  HGB 13.6  --   --  13.4  HCT 38.0*  --   --  38.4*  PLT 207  --   --  219  APTT 101*  --   --   --   LABPROT 15.2  --   --   --   INR 1.19  --   --   --   HEPARINUNFRC  --   --  <0.10* <0.10*  CREATININE 0.98  --   --   --   TROPONINI 6.78* 9.05* 8.68*  --     Estimated Creatinine Clearance: 90.9 mL/min (by C-G formula based on Cr of 0.98).  Assessment: Heparin level remains sub-therapeutic, no issues per RN, no bolus given s/p cath  Goal of Therapy:  Heparin level 0.3-0.7 units/ml Monitor platelets by anticoagulation protocol: Yes   Plan:  -Increase heparin drip to 1600 units/hr -1300 HL -Daily CBC/HL -Monitor for bleeding  Abran Duke 03/24/2014,5:40 AM

## 2014-03-24 NOTE — Progress Notes (Signed)
1 Day Post-Op Procedure(s) (LRB): LEFT HEART CATHETERIZATION WITH CORONARY ANGIOGRAM (N/A) Subjective: Denies chest pain  Objective: Vital signs in last 24 hours: Temp:  [99 F (37.2 C)-100 F (37.8 C)] 99.4 F (37.4 C) (02/07 1200) Pulse Rate:  [87-103] 95 (02/07 1200) Cardiac Rhythm:  [-] Normal sinus rhythm (02/07 1200) Resp:  [18-26] 18 (02/07 1200) BP: (98-167)/(52-91) 129/74 mmHg (02/07 1200) SpO2:  [88 %-98 %] 95 % (02/07 1200) Weight:  [205 lb 0.4 oz (93 kg)] 205 lb 0.4 oz (93 kg) (02/07 0500)  Hemodynamic parameters for last 24 hours:    Intake/Output from previous day: 02/06 0701 - 02/07 0700 In: 1943 [P.O.:1210; I.V.:733] Out: 1680 [Urine:1680] Intake/Output this shift: Total I/O In: 547.5 [P.O.:400; I.V.:147.5] Out: -   General appearance: alert, cooperative and no distress Neurologic: intact Heart: regular rate and rhythm  Lab Results:  Recent Labs  03/23/14 0903 03/24/14 0314  WBC 11.7* 12.3*  HGB 13.6 13.4  HCT 38.0* 38.4*  PLT 207 219   BMET:  Recent Labs  03/23/14 0903 03/24/14 0314  NA 134* 133*  K 3.4* 3.7  CL 103 99  CO2 18* 20  GLUCOSE 237* 314*  BUN 12 16  CREATININE 0.98 1.26  CALCIUM 7.7* 8.1*    PT/INR:  Recent Labs  03/23/14 0903  LABPROT 15.2  INR 1.19   ABG No results found for: PHART, HCO3, TCO2, ACIDBASEDEF, O2SAT CBG (last 3)   Recent Labs  03/23/14 2323 03/24/14 0807 03/24/14 1154  GLUCAP 275* 320* 393*    Assessment/Plan: S/P Procedure(s) (LRB): LEFT HEART CATHETERIZATION WITH CORONARY ANGIOGRAM (N/A) s/p MI, CAD- for CABG later this week  His blood sugars are poorly controlled- last one was 390 I added meal coverage, but he may need to go on insulin drip if they don't correct soon   LOS: 1 day    Kella Splinter C 03/24/2014

## 2014-03-24 NOTE — Progress Notes (Signed)
ANTICOAGULATION CONSULT NOTE - Follow-Up Consult  Pharmacy Consult for Heparin Indication: chest pain/ACS  No Known Allergies  Patient Measurements: Height: 5\' 8"  (172.7 cm) Weight: 205 lb 0.4 oz (93 kg) IBW/kg (Calculated) : 68.4 Heparin Dosing Weight: 87.2 kg  Vital Signs: Temp: 99.4 F (37.4 C) (02/07 1200) Temp Source: Oral (02/07 1200) BP: 132/67 mmHg (02/07 1300) Pulse Rate: 100 (02/07 1300)  Labs:  Recent Labs  03/23/14 0903 03/23/14 1600 03/23/14 2110 03/24/14 0314 03/24/14 1233  HGB 13.6  --   --  13.4  --   HCT 38.0*  --   --  38.4*  --   PLT 207  --   --  219  --   APTT 101*  --   --   --   --   LABPROT 15.2  --   --   --   --   INR 1.19  --   --   --   --   HEPARINUNFRC  --   --  <0.10* <0.10* 0.19*  CREATININE 0.98  --   --  1.26  --   TROPONINI 6.78* 9.05* 8.68*  --   --     Estimated Creatinine Clearance: 70.7 mL/min (by C-G formula based on Cr of 1.26).   Medical History: Past Medical History  Diagnosis Date  . Acute MI, lateral wall, initial episode of care 03/23/2014  . Type 2 diabetes mellitus 03/23/2014    Assessment: 59yo male arrived via EMS as code STEMI, taken immediately to cath lab for possible PCI.  Pt reported intermittent substernal chest pain x1 week.  Pharmacy consulted to dose heparin for ACS/chest pain.  CBC and PLT WNL, no bleeding noted  Heparin level remains subtherapeutic on IV heparin 1600 units/hr No bolus given s/p cath  Goal of Therapy:  Heparin level 0.3-0.7 units/ml Monitor platelets by anticoagulation protocol: Yes   Plan:  Increase heparin to 1900 units/hr Recheck 6hr heparin level at 2030 Monitor daily HL, CBC, s/sx bleeding  Waynette Buttery, PharmD Clinical Pharmacy Resident Pager: [phone] 03/24/2014 2:30 PM

## 2014-03-24 NOTE — Progress Notes (Signed)
At 2300- patient reported feeling restless. Patient was grabbing at chest and c/o of heartburn and "Im restless in my chest". 2305- one sublingual NTG tab placed under pt's tongue. Dr. Zachery Conch on unit , he was made aware of pt's condition. Orders received to start a NTG infusion. NTG infusion started. 12 lead EKG obtained. No changes see on EKG, Dr. Zachery Conch also viewed 12-lead EKG. Morphine 2 mg IV given for pain. Pt already on 2 LNC. Second sublingual NTG tab placed under pt's tongue at 2310 since the chest pain was unresolved. NTG infusion started  At 10 mcg. After second NTG tab given and infusion started pt reported that his chest felt better.  At 0045 patient was found on side of bed with bed exit alarm going off. Belching noted. Pt reports that chest pain and ingestion has come back , rates 8 out of 10. Increase the infusion of NTG, Mylanta given. Morphine 2 mg IV given for pain. At 0100- pt reports " I feel better, just wish I could sleep". Moderate restless present .Encouragement given to patient regarding current condition.

## 2014-03-25 ENCOUNTER — Other Ambulatory Visit: Payer: Self-pay | Admitting: *Deleted

## 2014-03-25 DIAGNOSIS — I2510 Atherosclerotic heart disease of native coronary artery without angina pectoris: Secondary | ICD-10-CM

## 2014-03-25 LAB — BASIC METABOLIC PANEL
Anion gap: 6 (ref 5–15)
BUN: 23 mg/dL (ref 6–23)
CO2: 23 mmol/L (ref 19–32)
Calcium: 7.7 mg/dL — ABNORMAL LOW (ref 8.4–10.5)
Chloride: 99 mmol/L (ref 96–112)
Creatinine, Ser: 1.31 mg/dL (ref 0.50–1.35)
GFR calc non Af Amer: 58 mL/min — ABNORMAL LOW (ref >90)
GFR, EST AFRICAN AMERICAN: 68 mL/min — AB (ref >90)
Glucose, Bld: 263 mg/dL — ABNORMAL HIGH (ref 70–99)
Potassium: 3.8 mmol/L (ref 3.5–5.1)
Sodium: 128 mmol/L — ABNORMAL LOW (ref 135–145)

## 2014-03-25 LAB — POCT ACTIVATED CLOTTING TIME: ACTIVATED CLOTTING TIME: 140 s

## 2014-03-25 LAB — HEMOGLOBIN A1C
Hgb A1c MFr Bld: 12.8 % — ABNORMAL HIGH (ref 4.8–5.6)
Mean Plasma Glucose: 321 mg/dL

## 2014-03-25 LAB — GLUCOSE, CAPILLARY: Glucose-Capillary: 233 mg/dL — ABNORMAL HIGH (ref 70–99)

## 2014-03-25 LAB — CBC
HCT: 34.2 % — ABNORMAL LOW (ref 39.0–52.0)
Hemoglobin: 11.8 g/dL — ABNORMAL LOW (ref 13.0–17.0)
MCH: 29.5 pg (ref 26.0–34.0)
MCHC: 34.5 g/dL (ref 30.0–36.0)
MCV: 85.5 fL (ref 78.0–100.0)
Platelets: 177 10*3/uL (ref 150–400)
RBC: 4 MIL/uL — ABNORMAL LOW (ref 4.22–5.81)
RDW: 12.7 % (ref 11.5–15.5)
WBC: 9.2 10*3/uL (ref 4.0–10.5)

## 2014-03-25 LAB — HEPARIN LEVEL (UNFRACTIONATED): Heparin Unfractionated: 0.3 IU/mL (ref 0.30–0.70)

## 2014-03-25 MED ORDER — INSULIN DETEMIR 100 UNIT/ML ~~LOC~~ SOLN
12.0000 [IU] | Freq: Every day | SUBCUTANEOUS | Status: DC
Start: 2014-03-25 — End: 2014-03-25
  Filled 2014-03-25: qty 0.12

## 2014-03-25 MED ORDER — INSULIN DETEMIR 100 UNIT/ML ~~LOC~~ SOLN
20.0000 [IU] | Freq: Every day | SUBCUTANEOUS | Status: DC
  Administered 2014-03-25: 20 [IU] via SUBCUTANEOUS
  Filled 2014-03-25: qty 0.2

## 2014-03-25 MED ORDER — LIVING WELL WITH DIABETES BOOK
Freq: Once | Status: AC
  Administered 2014-03-25: 12:00:00
  Filled 2014-03-25: qty 1

## 2014-03-25 MED ORDER — HEPARIN (PORCINE) IN NACL 100-0.45 UNIT/ML-% IJ SOLN
2100.0000 [IU]/h | INTRAMUSCULAR | Status: DC
  Administered 2014-03-25 – 2014-03-27 (×5): 2100 [IU]/h via INTRAVENOUS
  Filled 2014-03-25 (×10): qty 250

## 2014-03-25 MED ORDER — INSULIN ASPART 100 UNIT/ML ~~LOC~~ SOLN
7.0000 [IU] | Freq: Three times a day (TID) | SUBCUTANEOUS | Status: DC
  Administered 2014-03-25 – 2014-03-26 (×5): 7 [IU] via SUBCUTANEOUS

## 2014-03-25 MED ORDER — INSULIN ASPART 100 UNIT/ML ~~LOC~~ SOLN: 5.0000 [IU] | Freq: Three times a day (TID) | SUBCUTANEOUS | Status: DC

## 2014-03-25 MED ORDER — INSULIN DETEMIR 100 UNIT/ML ~~LOC~~ SOLN
14.0000 [IU] | Freq: Every day | SUBCUTANEOUS | Status: DC
  Filled 2014-03-25: qty 0.14

## 2014-03-25 NOTE — Progress Notes (Signed)
CARDIAC REHAB PHASE I   PRE:  Rate/Rhythm: 94 SR  BP:  Supine: 118/65  Sitting:   Standing:    SaO2: 97% 2L  MODE:  Ambulation: 190 ft   POST:  Rate/Rhythm: 103 ST  BP:  Supine:   Sitting: 118/54  Standing:    SaO2: 96%2L 1325-1410 Pt stated he was very weak and wanted to stay in bed. Discussed with pt that to get stronger he needed to get OOB. Pt walked 190 ft on 2L with gait belt use, rolling walker and asst x 2. Pt took very small steps and had some weakness in left leg that would kind of buckle at times. Pt's mother stated that he has had some weakness in hand and chest burning for a month. Would like for PT to evaluate pt too as he is very weak. To recliner after walk with family in room. Put pre op video on for pt and family to view. Pt has call light and instructed not to get up by himself.  He kept saying he was very weak and had not been up.   Luetta Nutting, RN BSN  03/25/2014 2:05 PM

## 2014-03-25 NOTE — Progress Notes (Signed)
Inpatient Diabetes Program Recommendations  AACE/ADA: New Consensus Statement on Inpatient Glycemic Control (2013)  Target Ranges:  Prepandial:   less than 140 mg/dL      Peak postprandial:   less than 180 mg/dL (1-2 hours)      Critically ill patients:  140 - 180 mg/dL   Results for TRIPP, BELTRAM (MRN [id number]) as of 03/25/2014 13:01  Ref. Range 03/24/2014 08:07 03/24/2014 11:54 03/24/2014 17:11 03/24/2014 21:54 03/25/2014 08:04  Glucose-Capillary Latest Range: 70-99 mg/dL 638 (H) 937 (H) 342 (H) 277 (H) 233 (H)    Diabetes history: DM2 Outpatient Diabetes medications: Humalog 25 units BID, Novolin R 10 units BID Current orders for Inpatient glycemic control: Levemir 14 units QHS, Novolog 0-15 units (moderate scale) TID, Novolog 7 units TID with meals for meal coverage   Inpatient Diabetes Program Recommendations Insulin - Basal: Noted patient's basal insulin was increased this am 03/25/14. Patient's blood glucose is still elevated in the 200-300's. Please consider increasing patient's basal insulin to Levemir 20 units QHS.  HgbA1C: 12.8%  on 03/23/14  Consult Note: Spoke with patient about diabetes and home regimen for diabetes control. Patient reports that his PCP is Dr. Charissa Bash from [facility]. Pt mentions that he did see a diabetes specialist at the clinic (Dr. Hyacinth Meeker). He reports that his blood glucose would go hight, they would increase the insulin and he would experience lows and would have to back down on the insulin doses. He was frustrated at how many times the insulin adjustment was made and decided not to go back to the diabetes specialst and instead tried to manage it himself. Insurance had a factor as well.  Inquired about knowledge about A1C and patient reports that he does know what an A1C is. Discussed A1C results (12.8% on 03/23/14) and explained what an A1C is, basic pathophysiology of DM Type 2, basic home care, importance of checking CBGs and maintaining good CBG control to  prevent long-term and short-term complications. Patient reports that he does check his blood glucose regularly and had been seeing numbers in the 200-300's at home (Which is consistent with A1c percentage). Discussed impact of nutrition, exercise, stress, sickness, and medications on diabetes control.  Patient states that he would eat cereal at breakfast, peanut butter crackers for lunch and a sandwich for dinner. Patient's RN discussed with me about how the patient and his wife were eating high carbohydrate and high sodium snacks when he was first admitted. RN educated both the patient and the wife at that time. Wife absent at this visit.  Today we discussed carbohydrates, carbohydrate goals per day and meal, along with portion sizes. Spoke with patient about keeping a record of his blood glucose levels to take to his PCP. I discussed the importance of glucose control on wound healing and outcomes postop.  Living Well with Diabetes book ordered, RD consult ordered, in addition to patient videos for education. Patient verbalized understanding of information discussed and he states that he has no further questions at this time related to diabetes.   Thanks, Christena Deem RN, MSN, [facility] Inpatient Diabetes Coordinator Team Pager [phone]

## 2014-03-25 NOTE — Progress Notes (Addendum)
2 Days Post-Op Procedure(s) (LRB): LEFT HEART CATHETERIZATION WITH CORONARY ANGIOGRAM (N/A) Subjective: Denies CP Visiting with family  Objective: Vital signs in last 24 hours: Temp:  [98.8 F (37.1 C)-100.9 F (38.3 C)] 99.1 F (37.3 C) (02/08 1600) Pulse Rate:  [85-113] 91 (02/08 1708) Cardiac Rhythm:  [-] Normal sinus rhythm (02/08 1600) Resp:  [17-26] 24 (02/08 1700) BP: (93-136)/(43-109) 126/86 mmHg (02/08 1708) SpO2:  [89 %-98 %] 92 % (02/08 1700) Weight:  [206 lb 5.6 oz (93.6 kg)] 206 lb 5.6 oz (93.6 kg) (02/08 0400)  Hemodynamic parameters for last 24 hours:    Intake/Output from previous day: 02/07 0701 - 02/08 0700 In: 2446.2 [P.O.:1715; I.V.:731.2] Out: 1950 [Urine:1950] Intake/Output this shift: Total I/O In: 1417.6 [P.O.:1120; I.V.:297.6] Out: 950 [Urine:950]  General appearance: alert and no distress Neurologic: intact Heart: regular rate and rhythm  Lab Results:  Recent Labs  03/24/14 0314 03/25/14 0320  WBC 12.3* 9.2  HGB 13.4 11.8*  HCT 38.4* 34.2*  PLT 219 177   BMET:  Recent Labs  03/24/14 0314 03/25/14 0320  NA 133* 128*  K 3.7 3.8  CL 99 99  CO2 20 23  GLUCOSE 314* 263*  BUN 16 23  CREATININE 1.26 1.31  CALCIUM 8.1* 7.7*    PT/INR:  Recent Labs  03/23/14 0903  LABPROT 15.2  INR 1.19   ABG No results found for: PHART, HCO3, TCO2, ACIDBASEDEF, O2SAT CBG (last 3)   Recent Labs  03/24/14 1711 03/24/14 2154 03/25/14 0804  GLUCAP 289* 277* 233*    Assessment/Plan: S/P Procedure(s) (LRB): LEFT HEART CATHETERIZATION WITH CORONARY ANGIOGRAM (N/A) FOR CABG ON THURS 2/11  His CBG remain elevated but are improving. Need to be better controlled before he goes to OR Hgb A1c of 12.8 indicates poorly controlled for quite some time  LOS: 2 days    HENDRICKSON,STEVEN C 03/25/2014

## 2014-03-25 NOTE — Progress Notes (Signed)
ANTICOAGULATION CONSULT NOTE - Follow-Up Consult  Pharmacy Consult for Heparin Indication: chest pain/ACS  No Known Allergies  Patient Measurements: Height: 5\' 8"  (172.7 cm) Weight: 206 lb 5.6 oz (93.6 kg) IBW/kg (Calculated) : 68.4 Heparin Dosing Weight: 87.2 kg  Vital Signs: Temp: 98.8 F (37.1 C) (02/08 0400) Temp Source: Oral (02/08 0400) BP: 131/72 mmHg (02/08 0800) Pulse Rate: 87 (02/08 0800)  Labs:  Recent Labs  03/23/14 0903 03/23/14 1600  03/23/14 2110 03/24/14 0314 03/24/14 1233 03/24/14 2010 03/25/14 0320  HGB 13.6  --   --   --  13.4  --   --  11.8*  HCT 38.0*  --   --   --  38.4*  --   --  34.2*  PLT 207  --   --   --  219  --   --  177  APTT 101*  --   --   --   --   --   --   --   LABPROT 15.2  --   --   --   --   --   --   --   INR 1.19  --   --   --   --   --   --   --   HEPARINUNFRC  --   --   < > <0.10* <0.10* 0.19* 0.29* 0.30  CREATININE 0.98  --   --   --  1.26  --   --  1.31  TROPONINI 6.78* 9.05*  --  8.68*  --   --   --   --   < > = values in this interval not displayed.  Estimated Creatinine Clearance: 68.2 mL/min (by C-G formula based on Cr of 1.31).   Medical History: Past Medical History  Diagnosis Date  . Acute MI, lateral wall, initial episode of care 03/23/2014  . Type 2 diabetes mellitus 03/23/2014    Assessment: 59yo male arrived via EMS as code STEMI now s/p cath with severe multivessel coronary artery disease with plans for CABG later this week. Heparin is at 2000units/hr and at the low end of goal (HL= 0.3).  Goal of Therapy:  Heparin level 0.3-0.7 units/ml Monitor platelets by anticoagulation protocol: Yes   Plan:  -Increase heparin to 2100 units/hr -Heparin level and CBC in am  Harland German, Pharm D 03/25/2014 8:26 AM

## 2014-03-25 NOTE — Progress Notes (Signed)
EKG CRITICAL VALUE     12 lead EKG performed.  Critical value noted.  Isabella Bowens, RN notified.   Deitra Mayo, CCT 03/25/2014 6:58 AM

## 2014-03-25 NOTE — Progress Notes (Signed)
Progress Note  Subjective:    No chest pain this AM. Says he feels sleepy and weak. No other complaints.   Objective:   Temp:  [98.8 F (37.1 C)-100.9 F (38.3 C)] 98.8 F (37.1 C) (02/08 0400) Pulse Rate:  [86-113] 87 (02/08 0800) Resp:  [18-30] 19 (02/08 0600) BP: (93-137)/(43-109) 131/72 mmHg (02/08 0800) SpO2:  [89 %-98 %] 95 % (02/08 0600) Weight:  [206 lb 5.6 oz (93.6 kg)] 206 lb 5.6 oz (93.6 kg) (02/08 0400) Last BM Date:  (03/22/14)  Filed Weights   03/23/14 0900 03/24/14 0500 03/25/14 0400  Weight: 201 lb 1 oz (91.2 kg) 205 lb 0.4 oz (93 kg) 206 lb 5.6 oz (93.6 kg)    Intake/Output Summary (Last 24 hours) at 03/25/14 0825 Last data filed at 03/25/14 0600  Gross per 24 hour  Intake 2147.68 ml  Output   1950 ml  Net 197.68 ml    Telemetry: NSR  Physical Exam: General: White male, alert, cooperative, NAD. HEENT: PERRL, EOMI. Moist mucus membranes Neck: Full range of motion without pain, supple, no lymphadenopathy or carotid bruits. JVP 8-10 cm Lungs: Air entry equal bilaterally, mils basilar crackles.  Heart: RRR, 3/6 holosystolic murmur. No S3/S4 or rub heard.  Abdomen: Soft, non-tender, non-distended, BS + Extremities: No cyanosis, clubbing, or edema.  Neurologic: Alert & oriented x3, cranial nerves II-XII intact, strength grossly intact, sensation intact to light touch   Lab Results:  Basic Metabolic Panel:  Recent Labs Lab 03/23/14 0903 03/24/14 0314 03/25/14 0320  NA 134* 133* 128*  K 3.4* 3.7 3.8  CL 103 99 99  CO2 18* 20 23  GLUCOSE 237* 314* 263*  BUN CREATININE 0.98 1.26 1.31  CALCIUM 7.7* 8.1* 7.7*    CBC:  Recent Labs Lab 03/23/14 0903 03/24/14 0314 03/25/14 0320  WBC 11.7* 12.3* 9.2  HGB 13.6 13.4 11.8*  HCT 38.0* 38.4* 34.2*  MCV 83.7 85.3 85.5  PLT 207 219 177    Cardiac Enzymes:  Recent Labs Lab 03/23/14 0903 03/23/14 1600 03/23/14 2110  TROPONINI 6.78* 9.05* 8.68*    BNP: No results for  input(s): PROBNP in the last 8760 hours.  Coagulation:  Recent Labs Lab 03/23/14 0903  INR 1.19    Radiology: Portable Chest X-ray 1 View  03/24/2014   CLINICAL DATA:  Acute MI, status post left heart catheterization  EXAM: PORTABLE CHEST - 1 VIEW  COMPARISON:  03/23/2014  FINDINGS: Mild bibasilar atelectasis. No focal consolidation. Pulmonary vascular congestion without frank interstitial edema. No pleural effusion or pneumothorax.  The heart is top-normal in size.  IMPRESSION: Mild bibasilar atelectasis.   Electronically Signed   By: Charline Bills M.D.   On: 03/24/2014 07:17     ECG: NSR w/ ST elevation, most significant in lateral leads.    Medications:   Scheduled Medications: . aspirin EC  81 mg Oral Daily  . atorvastatin  80 mg Oral q1800  . carvedilol  6.25 mg Oral BID WC  . furosemide  40 mg Intravenous BID  . insulin aspart  0-15 Units Subcutaneous TID WC  . insulin aspart  4 Units Subcutaneous TID WC  . insulin detemir  10 Units Subcutaneous QHS  . lisinopril  2.5 mg Oral Daily     Infusions: . heparin 2,000 Units/hr (03/24/14 2127)  . nitroGLYCERIN 30 mcg/min (03/24/14 2305)     PRN Medications:  acetaminophen, ALPRAZolam, alum & mag hydroxide-simeth, morphine injection, nitroGLYCERIN, ondansetron (ZOFRAN) IV, oxyCODONE-acetaminophen  Assessment and Plan:  59 y/o M w/ PMHx of DM type II, admitted 03/23/14 w/ lateral STEMI.   Lateral STEMI: Recent chest pain for about 1 week. Cardiac catheterization from 03/23/14 showed severe multivessel coronary artery disease with severe stenosis of the proximal circumflex, mid LAD, proximal first diagonal branch, and right PDA. Acute lateral wall infarction with anterolateral akinesis, suspected diagonal territory infarct with late clinical presentation also significant for moderate segmental LV systolic dysfunction with estimated LVEF 40-45%. No targets were identified that were amenable to PCI. Seen by Dr. Dorris Fetch on  03/23/14, plan for CABG later this week.  -Continue Heparin gtt -ASA -Continue Coreg 6.25 mg bid + Lisinopril 2.5 mg daily.  -Lipitor 80 mg qhs -CABG per CVTS  DM type II: Most recent CBG's as follows:   Recent Labs Lab 03/24/14 0807 03/24/14 1154 03/24/14 1711 03/24/14 2154 03/25/14 0804  GLUCAP 320* 393* 289* 277* 233*  -ISS-M. Increase meal time Novolog to 5 units tid -Increase Levemir to 12 units    Lauris Chroman, MD PGY-2 Internal Medicine Pager: [phone]  Patient seen, examined. Available data reviewed. Agree with findings, assessment, and plan as outlined by Lauris Chroman, MD. Exam reveals alert, oriented male in NAD. Heart is RRR with a loud friction rub. Lungs CTA, JVP mildly elevated. No peripheral edema.   He is now comfortable. Suspect post-MI pericarditis based on exam and EKG findings (diffuse ST elevation and PR depression). No specific Rx as he is chest pain-free. Would stop ACE as creatinine increasing and want to avoid further risk of acute kidney injury with CABG. Continue heparin and NTG. Received IV lasix this am. Will stop and repeat BMET tomorrow am. Echo reviewed and shows fairly well-preserved LVEF. Otherwise meds reviewed and he is on appropriate post-MI medical therapy. Consider addition of plavix POST-CABG.  Tonny Bollman, M.D. 03/25/2014 10:18 AM

## 2014-03-25 NOTE — Care Management Note (Signed)
    Page 1 of 1   03/25/2014     10:23:49 AM CARE MANAGEMENT NOTE 03/25/2014  Patient:  Danny [name], Danny [name]   Account Number:  [id number]  Date Initiated:  03/25/2014  Documentation initiated by:  Junius Creamer  Subjective/Objective Assessment:   adm w mi     Action/Plan:   lives w fam   Anticipated DC Date:     Anticipated DC Plan:        DC Planning Services  CM consult  [facility]      Choice offered to / List presented to:             Status of service:   Medicare Important Message given?   (If response is "NO", the following Medicare IM given date fields will be blank) Date Medicare IM given:   Medicare IM given by:   Date Additional Medicare IM given:   Additional Medicare IM given by:    Discharge Disposition:    Per UR Regulation:  Reviewed for med. necessity/level of care/duration of stay  If discussed at Long Length of Stay Meetings, dates discussed:    Comments:  2/8 1022 debbie Farran Amsden rn,bsn left pt inform on  [facility]. no ins listed.

## 2014-03-26 ENCOUNTER — Inpatient Hospital Stay (HOSPITAL_COMMUNITY): Payer: No Typology Code available for payment source

## 2014-03-26 ENCOUNTER — Encounter (HOSPITAL_COMMUNITY): Payer: Self-pay

## 2014-03-26 DIAGNOSIS — Z01810 Encounter for preprocedural cardiovascular examination: Secondary | ICD-10-CM

## 2014-03-26 LAB — PULMONARY FUNCTION TEST
FEF 25-75 Post: 0.42 L/sec
FEF 25-75 Pre: 0.83 L/sec
FEF2575-%CHANGE-POST: -49 %
FEF2575-%PRED-POST: 14 %
FEF2575-%PRED-PRE: 28 %
FEV1-%Change-Post: -11 %
FEV1-%PRED-POST: 29 %
FEV1-%Pred-Pre: 33 %
FEV1-POST: 1.01 L
FEV1-Pre: 1.14 L
FEV1FVC-%CHANGE-POST: 2 %
FEV1FVC-%Pred-Pre: 97 %
FEV6-%CHANGE-POST: -15 %
FEV6-%Pred-Post: 30 %
FEV6-%Pred-Pre: 35 %
FEV6-PRE: 1.54 L
FEV6-Post: 1.29 L
FEV6FVC-%Change-Post: -2 %
FEV6FVC-%Pred-Post: 101 %
FEV6FVC-%Pred-Pre: 104 %
FVC-%Change-Post: -14 %
FVC-%PRED-POST: 29 %
FVC-%Pred-Pre: 34 %
FVC-Post: 1.32 L
FVC-Pre: 1.54 L
POST FEV1/FVC RATIO: 76 %
PRE FEV1/FVC RATIO: 74 %
Post FEV6/FVC ratio: 98 %
Pre FEV6/FVC Ratio: 100 %

## 2014-03-26 LAB — GLUCOSE, CAPILLARY
GLUCOSE-CAPILLARY: 256 mg/dL — AB (ref 70–99)
GLUCOSE-CAPILLARY: 265 mg/dL — AB (ref 70–99)
GLUCOSE-CAPILLARY: 250 mg/dL — AB (ref 70–99)
Glucose-Capillary: 216 mg/dL — ABNORMAL HIGH (ref 70–99)
Glucose-Capillary: 254 mg/dL — ABNORMAL HIGH (ref 70–99)
Glucose-Capillary: 236 mg/dL — ABNORMAL HIGH (ref 70–99)

## 2014-03-26 LAB — HEPARIN LEVEL (UNFRACTIONATED): Heparin Unfractionated: 0.34 IU/mL (ref 0.30–0.70)

## 2014-03-26 LAB — HEMOGLOBIN A1C
Hgb A1c MFr Bld: 12.5 % — ABNORMAL HIGH (ref 4.8–5.6)
Mean Plasma Glucose: 312 mg/dL

## 2014-03-26 MED ORDER — INSULIN ASPART 100 UNIT/ML ~~LOC~~ SOLN
0.0000 [IU] | Freq: Three times a day (TID) | SUBCUTANEOUS | Status: DC
  Administered 2014-03-26: 11 [IU] via SUBCUTANEOUS
  Administered 2014-03-26 – 2014-03-27 (×2): 7 [IU] via SUBCUTANEOUS
  Administered 2014-03-27: 11 [IU] via SUBCUTANEOUS

## 2014-03-26 MED ORDER — INSULIN ASPART 100 UNIT/ML ~~LOC~~ SOLN
0.0000 [IU] | Freq: Every day | SUBCUTANEOUS | Status: DC
Start: 2014-03-26 — End: 2014-03-27
  Administered 2014-03-26: 2 [IU] via SUBCUTANEOUS

## 2014-03-26 MED ORDER — ALBUTEROL SULFATE (2.5 MG/3ML) 0.083% IN NEBU
2.5000 mg | INHALATION_SOLUTION | Freq: Once | RESPIRATORY_TRACT | Status: AC
  Administered 2014-03-26: 2.5 mg via RESPIRATORY_TRACT

## 2014-03-26 MED ORDER — INSULIN DETEMIR 100 UNIT/ML ~~LOC~~ SOLN
24.0000 [IU] | Freq: Every day | SUBCUTANEOUS | Status: DC
  Administered 2014-03-26: 24 [IU] via SUBCUTANEOUS
  Filled 2014-03-26: qty 0.24

## 2014-03-26 NOTE — Progress Notes (Addendum)
*  Preliminary Results*   Pre-op Cardiac Surgery  Carotid Findings:   Findings suggest 1-39% internal carotid artery stenosis bilaterally. Vertebral arteries are patent with antegrade flow.  03/26/2014 1:27 PM Michelle Simonetti, RVT, RDCS, RDMS   Limb dopplers pending Upper Extremity Right Left  Brachial Pressures 139 triphasic 143 triphasic  Radial Waveforms triphasic triphasic  Ulnar Waveforms triphasic triphasic  Palmar Arch (Allen's Test) * **   Findings:  *Right Doppler waveforms obliterate with radial and remain normal with ulnar compressions.  **Left:  Doppler waveforms remain normal with radial and ulnar compressions.    Lower  Extremity Right Left  Dorsalis Pedis    Anterior Tibial 152 triphasic 167 triphasic  Posterior Tibial 157 triphasic 161 triphasic  Ankle/Brachial Indices >1.0 >1.0    Findings:  ABI is within normal limits bilaterally.

## 2014-03-26 NOTE — Plan of Care (Addendum)
Problem: Food- and Nutrition-Related Knowledge Deficit (NB-1.1) Goal: Nutrition education Formal process to instruct or train a patient/client in a skill or to impart knowledge to help patients/clients voluntarily manage or modify food choices and eating behavior to maintain or improve health. Outcome: Completed/Met Date Met:  03/26/14  RD consulted for nutrition education regarding diabetes.   Per pt he was dx with DM in 1995 and attended classes, etc. He has not had any education since that time. He stuck to a bland diet for 2 years but then got tired of it and started to eat like his old habits again. He eats cereal and a sausage biscuit for breakfast, lunch is a sandwich and fruit, and dinner is mac and cheese, pork chop. Pt is willing to attend outpatient classes, referral entered.      Lab Results  Component Value Date    HGBA1C 12.5* 03/25/2014    RD provided "Carbohydrate Counting for People with Diabetes" handout from the Academy of [employer]etics. Discussed different food groups and their effects on blood sugar, emphasizing carbohydrate-containing foods. Provided list of carbohydrates and recommended serving sizes of common foods.  Discussed importance of controlled and consistent carbohydrate intake throughout the day. Provided examples of ways to balance meals/snacks and encouraged intake of high-fiber, whole grain complex carbohydrates. Teach back method used.  Expect fair compliance.  Body mass index is 31.48 kg/(m^2). Pt meets criteria for obesity class I based on current BMI.  Current diet order is Heart Healthy, CHO Modified, patient is consuming approximately 100% of meals at this time. Labs and medications reviewed. No further nutrition interventions warranted at this time. RD contact information provided. If additional nutrition issues arise, please re-consult RD.  [place], [place], [place] Pager [phone] After Hours Pager

## 2014-03-26 NOTE — Progress Notes (Signed)
CARDIAC REHAB PHASE I   PRE:  Rate/Rhythm: 90 SR  BP:  Supine: 139/79  Sitting:   Standing:    SaO2: 96% 2L then RA  MODE:  Ambulation: 225 ft   POST:  Rate/Rhythm: 92 SR  BP:  Supine:   Sitting: 133/80  Standing:    SaO2: 89-90%RA 1215-1243 Pt assisted to bathroom and then walked to 2H19. Pt is weak and needed assistance with cleaning. We walked with gait belt use, rolling walker and asst x 2 as left leg still buckling at times. Pt stated that this was not happening prior to admission and that it is as if he cannot lock knee. Has PT consult. Pt stated that when he had his "spells" prior to admission, he would have SOB, mild CP and his left hand would become cold. Seems more deconditioned than he should be at his age and since he stated he was walking well before admission.   Luetta Nutting, RN BSN  03/26/2014 12:39 PM

## 2014-03-26 NOTE — Progress Notes (Addendum)
Progress Note  Subjective:    No complaints this AM. Still w/ fatigue. No chest pain or SOB.   Objective:   Temp:  [98.1 F (36.7 C)-100 F (37.8 C)] 98.8 F (37.1 C) (02/09 0400) Pulse Rate:  [86-95] 94 (02/09 0403) Resp:  [14-27] 25 (02/09 0400) BP: (112-136)/(57-89) 123/89 mmHg (02/09 0403) SpO2:  [89 %-98 %] 96 % (02/09 0400) Weight:  [207 lb 0.2 oz (93.9 kg)] 207 lb 0.2 oz (93.9 kg) (02/09 0413) Last BM Date:  (03/22/14)  Filed Weights   03/24/14 0500 03/25/14 0400 03/26/14 0413  Weight: 205 lb 0.4 oz (93 kg) 206 lb 5.6 oz (93.6 kg) 207 lb 0.2 oz (93.9 kg)    Intake/Output Summary (Last 24 hours) at 03/26/14 0803 Last data filed at 03/26/14 0645  Gross per 24 hour  Intake 2658.63 ml  Output   3075 ml  Net -416.37 ml    Telemetry: NSR  Physical Exam: General: White male, alert, cooperative, NAD. Resting Comfortably.  HEENT: PERRL, EOMI. Moist mucus membranes Neck: Full range of motion without pain, supple, no lymphadenopathy or carotid bruits. JVP 8-10 cm Lungs: Air entry equal bilaterally, mils basilar crackles.  Heart: RRR, no gallop. Friction rub present on exam.  Abdomen: Soft, non-tender, non-distended, BS + Extremities: No cyanosis, clubbing, or edema.  Neurologic: Alert & oriented x3, cranial nerves II-XII intact, strength grossly intact, sensation intact to light touch   Lab Results:  Basic Metabolic Panel:  Recent Labs Lab 03/23/14 0903 03/24/14 0314 03/25/14 0320  NA 134* 133* 128*  K 3.4* 3.7 3.8  CL 103 99 99  CO2 18* 20 23  GLUCOSE 237* 314* 263*  BUN CREATININE 0.98 1.26 1.31  CALCIUM 7.7* 8.1* 7.7*    CBC:  Recent Labs Lab 03/23/14 0903 03/24/14 0314 03/25/14 0320  WBC 11.7* 12.3* 9.2  HGB 13.6 13.4 11.8*  HCT 38.0* 38.4* 34.2*  MCV 83.7 85.3 85.5  PLT 207 219 177    Cardiac Enzymes:  Recent Labs Lab 03/23/14 0903 03/23/14 1600 03/23/14 2110  TROPONINI 6.78* 9.05* 8.68*     Coagulation:  Recent  Labs Lab 03/23/14 0903  INR 1.19     ECG: NSR w/ ST elevation, most significant in lateral leads.    Medications:   Scheduled Medications: . aspirin EC  81 mg Oral Daily  . atorvastatin  80 mg Oral q1800  . carvedilol  6.25 mg Oral BID WC  . insulin aspart  0-15 Units Subcutaneous TID WC  . insulin aspart  7 Units Subcutaneous TID WC  . insulin detemir  20 Units Subcutaneous QHS    Infusions: . heparin 2,100 Units/hr (03/25/14 2328)  . nitroGLYCERIN 30 mcg/min (03/25/14 2000)    PRN Medications: acetaminophen, ALPRAZolam, alum & mag hydroxide-simeth, morphine injection, nitroGLYCERIN, ondansetron (ZOFRAN) IV, oxyCODONE-acetaminophen   Assessment and Plan:  59 y/o M w/ PMHx of DM type II, admitted 03/23/14 w/ lateral STEMI.   Lateral STEMI: Cardiac catheterization from 03/23/14 showed severe multivessel coronary artery disease with severe stenosis of the proximal circumflex, mid LAD, proximal first diagonal branch, and right PDA. Acute lateral wall infarction with anterolateral akinesis, suspected diagonal territory infarct with late clinical presentation also significant for moderate segmental LV systolic dysfunction with estimated LVEF 40-45%. No targets were identified that were amenable to PCI. Seen by Dr. Dorris Fetch, plan for CABG Thursday.  -Continue Heparin gtt -ASA -Continue Coreg 6.25 mg bid + Lisinopril 2.5 mg daily.  -Lipitor 80  mg qhs -CABG per CVTS  DM type II: Still w/ CBG's in the 230's this AM.  -Increase to ISS-R, add HS coverage. Continue Novolog 7 units tid -Increase Levemir to 24 units  Mild Systolic CHF: ECHO w/ EF of 50%. Stopped IV Lasix yesterday, still w/ 3L urine output. Cr 1.31 this AM, only mildly elevated from 1.26 yesterday.  -Start Lasix 40 mg po daily.  -Coreg as above    Lauris Chroman, MD PGY-2 Internal Medicine Pager: [phone]  Patient seen, examined. Available data reviewed. Agree with findings, assessment, and plan as outlined by  Lauris Chroman, MD. the patient has no further chest pain. Exam reveals clear lung fields. The heart is regular with a typical friction rub present. There is no peripheral edema. His gross neurologic exam appears nonfocal.  Reported leg weakness and gait unsteadiness by cardiac rehabilitation. The patient feels like he has having some balance problems. Considering his multiple cardiac risk factors and recent MI, I think we should do a CT of the brain to rule out an acute stroke or embolic event. Otherwise continue current medical therapies. Basal and medial coverage insulin has been increased again to achieve better glycemic control.  Tonny Bollman, M.D. 03/26/2014 2:00 PM

## 2014-03-26 NOTE — Progress Notes (Signed)
ANTICOAGULATION CONSULT NOTE - Follow-Up Consult  Pharmacy Consult for Heparin Indication: chest pain/ACS  No Known Allergies  Patient Measurements: Height: 5\' 8"  (172.7 cm) Weight: 207 lb 0.2 oz (93.9 kg) IBW/kg (Calculated) : 68.4 Heparin Dosing Weight: 87.2 kg  Vital Signs: Temp: 99 F (37.2 C) (02/09 0800) Temp Source: Oral (02/09 0800) BP: 130/76 mmHg (02/09 0800) Pulse Rate: 86 (02/09 0800)  Labs:  Recent Labs  03/23/14 1600  03/23/14 2110 03/24/14 0314  03/24/14 2010 03/25/14 0320 03/26/14 0325  HGB  --   --   --  13.4  --   --  11.8*  --   HCT  --   --   --  38.4*  --   --  34.2*  --   PLT  --   --   --  219  --   --  177  --   HEPARINUNFRC  --   < > <0.10* <0.10*  < > 0.29* 0.30 0.34  CREATININE  --   --   --  1.26  --   --  1.31  --   TROPONINI 9.05*  --  8.68*  --   --   --   --   --   < > = values in this interval not displayed.  Estimated Creatinine Clearance: 68.3 mL/min (by C-G formula based on Cr of 1.31).   Medical History: Past Medical History  Diagnosis Date  . Acute MI, lateral wall, initial episode of care 03/23/2014  . Type 2 diabetes mellitus 03/23/2014    Assessment: 59yo male arrived via EMS as code STEMI now s/p cath with severe multivessel coronary artery disease with plans for CABG later this week. Heparin is at 2100units/hr and at goal (HL= 0.34).  Goal of Therapy:  Heparin level 0.3-0.7 units/ml Monitor platelets by anticoagulation protocol: Yes   Plan:  -No heparin changes needed -Heparin level and CBC in am  Harland German, Pharm D 03/26/2014 10:55 AM

## 2014-03-26 NOTE — Progress Notes (Signed)
Patient called out stating he needed to have a bowel movement. After entering room seconds later, patient was found to have already had a bowel movement in bed. Attempted to stand patient up at the side of the bed and to the bathroom with one person assist. Patient exhibited a very unsteady gait and had poor safety awareness. Patient also became increasingly short of breath and stated that he felt very weak.  Assisted patient to recliner and activated chair alarm. Reminded patient not to get up by himself. Call light within reach.

## 2014-03-27 ENCOUNTER — Other Ambulatory Visit: Payer: Self-pay | Admitting: *Deleted

## 2014-03-27 ENCOUNTER — Inpatient Hospital Stay (HOSPITAL_COMMUNITY): Payer: No Typology Code available for payment source

## 2014-03-27 DIAGNOSIS — Z01810 Encounter for preprocedural cardiovascular examination: Secondary | ICD-10-CM

## 2014-03-27 DIAGNOSIS — I2510 Atherosclerotic heart disease of native coronary artery without angina pectoris: Secondary | ICD-10-CM

## 2014-03-27 LAB — BLOOD GAS, ARTERIAL
ACID-BASE DEFICIT: 1.8 mmol/L (ref 0.0–2.0)
BICARBONATE: 21.8 meq/L (ref 20.0–24.0)
Drawn by: 33100
O2 Content: 2 L/min
O2 SAT: 95.1 %
Patient temperature: 99.8
TCO2: 22.7 mmol/L (ref 0–100)
pCO2 arterial: 33.4 mmHg — ABNORMAL LOW (ref 35.0–45.0)
pH, Arterial: 7.433 (ref 7.350–7.450)
pO2, Arterial: 72.1 mmHg — ABNORMAL LOW (ref 80.0–100.0)

## 2014-03-27 LAB — URINALYSIS, ROUTINE W REFLEX MICROSCOPIC
Bilirubin Urine: NEGATIVE
Glucose, UA: >1000 mg/dL — AB
KETONES UR: 15 mg/dL — AB
Leukocytes, UA: NEGATIVE
Nitrite: NEGATIVE
PH: 6 (ref 5.0–8.0)
Protein, ur: 30 mg/dL — AB
SPECIFIC GRAVITY, URINE: 1.028 (ref 1.005–1.030)
Urobilinogen, UA: 1 mg/dL (ref 0.0–1.0)

## 2014-03-27 LAB — GLUCOSE, CAPILLARY
GLUCOSE-CAPILLARY: 261 mg/dL — AB (ref 70–99)
GLUCOSE-CAPILLARY: 225 mg/dL — AB (ref 70–99)
GLUCOSE-CAPILLARY: 150 mg/dL — AB (ref 70–99)
Glucose-Capillary: 245 mg/dL — ABNORMAL HIGH (ref 70–99)
Glucose-Capillary: 314 mg/dL — ABNORMAL HIGH (ref 70–99)
Glucose-Capillary: 271 mg/dL — ABNORMAL HIGH (ref 70–99)
Glucose-Capillary: 318 mg/dL — ABNORMAL HIGH (ref 70–99)
Glucose-Capillary: 281 mg/dL — ABNORMAL HIGH (ref 70–99)
Glucose-Capillary: 247 mg/dL — ABNORMAL HIGH (ref 70–99)
Glucose-Capillary: 217 mg/dL — ABNORMAL HIGH (ref 70–99)
Glucose-Capillary: 192 mg/dL — ABNORMAL HIGH (ref 70–99)
Glucose-Capillary: 123 mg/dL — ABNORMAL HIGH (ref 70–99)
Glucose-Capillary: 103 mg/dL — ABNORMAL HIGH (ref 70–99)

## 2014-03-27 LAB — BASIC METABOLIC PANEL
Anion gap: 9 (ref 5–15)
BUN: 17 mg/dL (ref 6–23)
CALCIUM: 7.6 mg/dL — AB (ref 8.4–10.5)
CO2: 21 mmol/L (ref 19–32)
CREATININE: 0.96 mg/dL (ref 0.50–1.35)
Chloride: 96 mmol/L (ref 96–112)
GFR calc Af Amer: >90 mL/min (ref >90)
GFR calc non Af Amer: 90 mL/min — ABNORMAL LOW (ref >90)
Glucose, Bld: 267 mg/dL — ABNORMAL HIGH (ref 70–99)
Potassium: 4.2 mmol/L (ref 3.5–5.1)
Sodium: 126 mmol/L — ABNORMAL LOW (ref 135–145)

## 2014-03-27 LAB — URINE MICROSCOPIC-ADD ON

## 2014-03-27 LAB — TYPE AND SCREEN
ABO/RH(D): B POS
ANTIBODY SCREEN: NEGATIVE

## 2014-03-27 LAB — SURGICAL PCR SCREEN
MRSA, PCR: NEGATIVE
Staphylococcus aureus: NEGATIVE

## 2014-03-27 LAB — ABO/RH: ABO/RH(D): B POS

## 2014-03-27 LAB — SODIUM, URINE, RANDOM: Sodium, Ur: <10 mmol/L

## 2014-03-27 LAB — OSMOLALITY, URINE: Osmolality, Ur: 728 mOsm/kg (ref 390–1090)

## 2014-03-27 LAB — HEPARIN LEVEL (UNFRACTIONATED): Heparin Unfractionated: 0.34 IU/mL (ref 0.30–0.70)

## 2014-03-27 LAB — OSMOLALITY: Osmolality: 278 mOsm/kg (ref 275–300)

## 2014-03-27 MED ORDER — DEXTROSE 5 % IV SOLN
30.0000 ug/min | INTRAVENOUS | Status: AC
  Administered 2014-03-28: 20 ug/min via INTRAVENOUS
  Filled 2014-03-27: qty 2

## 2014-03-27 MED ORDER — POTASSIUM CHLORIDE 2 MEQ/ML IV SOLN
80.0000 meq | INTRAVENOUS | Status: DC
  Filled 2014-03-27: qty 40

## 2014-03-27 MED ORDER — CHLORHEXIDINE GLUCONATE CLOTH 2 % EX PADS
6.0000 | MEDICATED_PAD | Freq: Once | CUTANEOUS | Status: AC
  Administered 2014-03-28: 6 via TOPICAL

## 2014-03-27 MED ORDER — ALBUTEROL SULFATE (2.5 MG/3ML) 0.083% IN NEBU: 2.5000 mg | INHALATION_SOLUTION | RESPIRATORY_TRACT | Status: DC | PRN

## 2014-03-27 MED ORDER — OXYCODONE-ACETAMINOPHEN 5-325 MG PO TABS
ORAL_TABLET | ORAL | Status: AC
  Filled 2014-03-27: qty 2

## 2014-03-27 MED ORDER — SODIUM CHLORIDE 0.9 % IV SOLN
INTRAVENOUS | Status: AC
  Administered 2014-03-28: 70 mL/h via INTRAVENOUS
  Filled 2014-03-27: qty 40

## 2014-03-27 MED ORDER — BISACODYL 5 MG PO TBEC
5.0000 mg | DELAYED_RELEASE_TABLET | Freq: Once | ORAL | Status: AC
  Administered 2014-03-27: 5 mg via ORAL
  Filled 2014-03-27: qty 1

## 2014-03-27 MED ORDER — DIAZEPAM 5 MG PO TABS
5.0000 mg | ORAL_TABLET | Freq: Once | ORAL | Status: AC
  Administered 2014-03-28: 5 mg via ORAL
  Filled 2014-03-27: qty 1

## 2014-03-27 MED ORDER — EPINEPHRINE HCL 1 MG/ML IJ SOLN
0.0000 ug/min | INTRAMUSCULAR | Status: DC
  Filled 2014-03-27: qty 4

## 2014-03-27 MED ORDER — INSULIN ASPART 100 UNIT/ML ~~LOC~~ SOLN
9.0000 [IU] | Freq: Three times a day (TID) | SUBCUTANEOUS | Status: DC
  Administered 2014-03-27 (×2): 9 [IU] via SUBCUTANEOUS

## 2014-03-27 MED ORDER — INSULIN DETEMIR 100 UNIT/ML ~~LOC~~ SOLN
28.0000 [IU] | Freq: Every day | SUBCUTANEOUS | Status: DC
Start: 2014-03-27 — End: 2014-03-27
  Filled 2014-03-27: qty 0.28

## 2014-03-27 MED ORDER — SODIUM CHLORIDE 0.9 % IV SOLN
INTRAVENOUS | Status: DC
  Administered 2014-03-27: 2.6 [IU]/h via INTRAVENOUS
  Filled 2014-03-27: qty 2.5

## 2014-03-27 MED ORDER — HEPARIN (PORCINE) IN NACL 100-0.45 UNIT/ML-% IJ SOLN
INTRAMUSCULAR | Status: AC
  Filled 2014-03-27: qty 250

## 2014-03-27 MED ORDER — PAPAVERINE HCL 30 MG/ML IJ SOLN
INTRAMUSCULAR | Status: AC
  Administered 2014-03-28: 500 mL
  Filled 2014-03-27: qty 2.5

## 2014-03-27 MED ORDER — ALBUTEROL SULFATE HFA 108 (90 BASE) MCG/ACT IN AERS: 1.0000 | INHALATION_SPRAY | RESPIRATORY_TRACT | Status: DC | PRN

## 2014-03-27 MED ORDER — SODIUM CHLORIDE 0.9 % IV SOLN
INTRAVENOUS | Status: DC
  Filled 2014-03-27: qty 30

## 2014-03-27 MED ORDER — DEXTROSE 50 % IV SOLN: 25.0000 mL | INTRAVENOUS | Status: DC | PRN

## 2014-03-27 MED ORDER — SODIUM CHLORIDE 0.9 % IV SOLN
INTRAVENOUS | Status: DC
  Administered 2014-03-27: 15:00:00 via INTRAVENOUS

## 2014-03-27 MED ORDER — MAGNESIUM SULFATE 50 % IJ SOLN
40.0000 meq | INTRAMUSCULAR | Status: DC
  Filled 2014-03-27: qty 10

## 2014-03-27 MED ORDER — VANCOMYCIN HCL 10 G IV SOLR
1250.0000 mg | INTRAVENOUS | Status: AC
  Administered 2014-03-28: 1250 mg via INTRAVENOUS
  Filled 2014-03-27 (×2): qty 1250

## 2014-03-27 MED ORDER — DEXTROSE-NACL 5-0.45 % IV SOLN
INTRAVENOUS | Status: DC
  Administered 2014-03-27: 21:00:00 via INTRAVENOUS

## 2014-03-27 MED ORDER — DEXTROSE 5 % IV SOLN
750.0000 mg | INTRAVENOUS | Status: DC
  Filled 2014-03-27: qty 750

## 2014-03-27 MED ORDER — DEXTROSE 5 % IV SOLN
1.5000 g | INTRAVENOUS | Status: AC
  Administered 2014-03-28: 750 g via INTRAVENOUS
  Administered 2014-03-28: 1.5 g via INTRAVENOUS
  Filled 2014-03-27: qty 1.5

## 2014-03-27 MED ORDER — SODIUM CHLORIDE 0.9 % IV SOLN
INTRAVENOUS | Status: DC
  Filled 2014-03-27: qty 2.5

## 2014-03-27 MED ORDER — CHLORHEXIDINE GLUCONATE CLOTH 2 % EX PADS
6.0000 | MEDICATED_PAD | Freq: Once | CUTANEOUS | Status: AC
  Administered 2014-03-27: 6 via TOPICAL

## 2014-03-27 MED ORDER — NITROGLYCERIN IN D5W 200-5 MCG/ML-% IV SOLN
2.0000 ug/min | INTRAVENOUS | Status: AC
  Administered 2014-03-28: 5 ug/min via INTRAVENOUS
  Filled 2014-03-27: qty 250

## 2014-03-27 MED ORDER — DOPAMINE-DEXTROSE 3.2-5 MG/ML-% IV SOLN
0.0000 ug/kg/min | INTRAVENOUS | Status: DC
  Filled 2014-03-27: qty 250

## 2014-03-27 MED ORDER — METOPROLOL TARTRATE 12.5 MG HALF TABLET
12.5000 mg | ORAL_TABLET | Freq: Once | ORAL | Status: AC
  Administered 2014-03-28: 12.5 mg via ORAL
  Filled 2014-03-27: qty 1

## 2014-03-27 MED ORDER — DEXMEDETOMIDINE HCL IN NACL 400 MCG/100ML IV SOLN
0.1000 ug/kg/h | INTRAVENOUS | Status: AC
  Administered 2014-03-28: 0.2 ug/kg/h via INTRAVENOUS
  Filled 2014-03-27: qty 100

## 2014-03-27 MED ORDER — TEMAZEPAM 15 MG PO CAPS
15.0000 mg | ORAL_CAPSULE | Freq: Once | ORAL | Status: AC | PRN
  Administered 2014-03-27: 15 mg via ORAL
  Filled 2014-03-27: qty 1

## 2014-03-27 MED ORDER — INSULIN DETEMIR 100 UNIT/ML ~~LOC~~ SOLN
28.0000 [IU] | Freq: Two times a day (BID) | SUBCUTANEOUS | Status: DC
  Administered 2014-03-27: 28 [IU] via SUBCUTANEOUS
  Filled 2014-03-27 (×2): qty 0.28

## 2014-03-27 NOTE — Evaluation (Signed)
Physical Therapy Evaluation Patient Details Name: Malick Netz MRN: [id number] DOB: 1955-09-07 Today's Date: 03/27/2014   History of Present Illness  Pt adm with chest pain and found to have STEMI. Pt to have CABG on 03/28/14. PMH - DM  Clinical Impression  Pt presents to PT with deconditioning and decr mobility and is scheduled for CABG tomorrow. Will need to re-eval pt after surgery and will need re-order.    Follow Up Recommendations Other (comment) (to be assessed after surgery)    Equipment Recommendations  Other (comment) (to be assessed after surgery)    Recommendations for Other Services       Precautions / Restrictions Precautions Precautions: Fall Restrictions Weight Bearing Restrictions: No      Mobility  Bed Mobility               General bed mobility comments: Pt in chair.  Transfers Overall transfer level: Needs assistance Equipment used: Rolling walker (2 wheeled) Transfers: Sit to/from Stand Sit to Stand: Min assist         General transfer comment: Assist for balance and support.  Ambulation/Gait Ambulation/Gait assistance: Min guard Ambulation Distance (Feet): 140 Feet Assistive device: Rolling walker (2 wheeled) Gait Pattern/deviations: Step-through pattern;Decreased step length - right;Decreased step length - left;Shuffle;Trunk flexed Gait velocity: decr Gait velocity interpretation: Below normal speed for age/gender General Gait Details: Verbal cues to stand more erect and to incr step length.  Stairs            Wheelchair Mobility    Modified Rankin (Stroke Patients Only)       Balance Overall balance assessment: Needs assistance Sitting-balance support: No upper extremity supported;Feet supported Sitting balance-Leahy Scale: Good     Standing balance support: Bilateral upper extremity supported Standing balance-Leahy Scale: Poor Standing balance comment: support of walker                              Pertinent Vitals/Pain Pain Assessment: No/denies pain    Home Living Family/patient expects to be discharged to:: Private residence Living Arrangements: Spouse/significant other Available Help at Discharge: Family;Available 24 hours/day Type of Home: House Home Access: Stairs to enter Entrance Stairs-Rails: Right Entrance Stairs-Number of Steps: 4 Home Layout: One level Home Equipment: None      Prior Function Level of Independence: Independent         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     Higher education careers adviser   Dominant Hand: Right    Extremity/Trunk Assessment   Upper Extremity Assessment: Overall WFL for tasks assessed           Lower Extremity Assessment: Generalized weakness         Communication   Communication: No difficulties  Cognition Arousal/Alertness: Awake/alert Behavior During Therapy: WFL for tasks assessed/performed Overall Cognitive Status: Within Functional Limits for tasks assessed                      General Comments      Exercises        Assessment/Plan    PT Assessment Patient needs continued PT services  PT Diagnosis Difficulty walking;Generalized weakness   PT Problem List Decreased strength;Decreased activity tolerance;Decreased knowledge of use of DME;Decreased balance;Decreased mobility  PT Treatment Interventions DME instruction;Gait training;Balance training;Stair training;Functional mobility training;Patient/family education;Therapeutic activities;Therapeutic exercise   PT Goals (Current goals can be found in the Care Plan section) Acute Rehab PT Goals Patient Stated Goal: return home  PT Goal Formulation: With patient Time For Goal Achievement: 04/10/14 Potential to Achieve Goals: Good    Frequency Min 3X/week   Barriers to discharge        Co-evaluation               End of Session Equipment Utilized During Treatment: Gait belt Activity Tolerance: Patient limited by fatigue Patient left:  in chair;with call bell/phone within reach Nurse Communication: Mobility status         Time: 7943-2761 PT Time Calculation (min) (ACUTE ONLY): 16 min   Charges:   PT Evaluation $Initial PT Evaluation Tier I: 1 Procedure     PT G Codes:        Jorden Minchey 2014-04-02, 11:48 AM  Skip Mayer PT [phone]

## 2014-03-27 NOTE — Progress Notes (Signed)
ANTICOAGULATION CONSULT NOTE - Follow-Up Consult  Pharmacy Consult for Heparin Indication: chest pain/ACS  No Known Allergies  Patient Measurements: Height: 5\' 8"  (172.7 cm) Weight: 208 lb 5.4 oz (94.5 kg) IBW/kg (Calculated) : 68.4 Heparin Dosing Weight: 87.2 kg  Vital Signs: Temp: 98.4 F (36.9 C) (02/10 0755) Temp Source: Oral (02/10 0755) BP: 125/83 mmHg (02/10 0755) Pulse Rate: 89 (02/10 0406)  Labs:  Recent Labs  03/25/14 0320 03/26/14 0325 03/27/14 0240  HGB 11.8*  --   --   HCT 34.2*  --   --   PLT 177  --   --   HEPARINUNFRC 0.30 0.34 0.34  CREATININE 1.31  --  0.96    Estimated Creatinine Clearance: 93.5 mL/min (by C-G formula based on Cr of 0.96).   Medical History: Past Medical History  Diagnosis Date  . Acute MI, lateral wall, initial episode of care 03/23/2014  . Type 2 diabetes mellitus 03/23/2014    Assessment: 59yo male arrived via EMS as code STEMI now s/p cath with severe multivessel coronary artery disease with plans for CABG on 2/11. Heparin is at 2100units/hr and at goal (HL= 0.34).  Goal of Therapy:  Heparin level 0.3-0.7 units/ml Monitor platelets by anticoagulation protocol: Yes   Plan:  -No heparin changes needed -Heparin level and CBC in am  Harland German, Pharm D 03/27/2014 10:13 AM

## 2014-03-27 NOTE — Progress Notes (Signed)
4 Days Post-Op Procedure(s) (LRB): LEFT HEART CATHETERIZATION WITH CORONARY ANGIOGRAM (N/A) Subjective: No CP or SOB  Objective: Vital signs in last 24 hours: Temp:  [98.4 F (36.9 [name])-100.1 F (37.8 [name])] 99.8 F (37.7 [name]) (02/10 1200) Pulse Rate:  [84-95] 91 (02/10 1200) Cardiac Rhythm:  [-] Normal sinus rhythm (02/10 1200) Resp:  [16-24] 16 (02/10 1200) BP: (125-141)/(57-87) 141/86 mmHg (02/10 1200) SpO2:  [92 %-96 %] 96 % (02/10 1200) Weight:  [208 lb 5.4 oz (94.5 kg)] 208 lb 5.4 oz (94.5 kg) (02/10 0406)  Hemodynamic parameters for last 24 hours:    Intake/Output from previous day: 02/09 0701 - 02/10 0700 In: 1560 [P.O.:840; I.V.:720] Out: 1550 [Urine:1550] Intake/Output this shift: Total I/O In: 220.8 [I.V.:220.8] Out: 500 [Urine:500]  General appearance: alert and cooperative Heart: regular rate and rhythm Lungs: clear to auscultation bilaterally  Lab Results:  Recent Labs  03/25/14 0320  WBC 9.2  HGB 11.8*  HCT 34.2*  PLT 177   BMET:  Recent Labs  03/25/14 0320 03/27/14 0240  NA 128* 126*  K 3.8 4.2  CL 99 96  CO2 23 21  GLUCOSE 263* 267*  BUN 23 17  CREATININE 1.31 0.96  CALCIUM 7.7* 7.6*    PT/INR: No results for input(s): LABPROT, INR in the last 72 hours. ABG    Component Value Date/Time   PHART 7.433 03/27/2014 1220   HCO3 21.8 03/27/2014 1220   TCO2 22.7 03/27/2014 1220   ACIDBASEDEF 1.8 03/27/2014 1220   O2SAT 95.1 03/27/2014 1220   CBG (last 3)   Recent Labs  03/27/14 0757 03/27/14 0931 03/27/14 1211  GLUCAP 261* 314* 271*    Assessment/Plan: S/P Procedure(s) (LRB): LEFT HEART CATHETERIZATION WITH CORONARY ANGIOGRAM (N/A) -  CAD s/p MI- for CABG tomorrow Has rub on exam [name]/w pericarditis His CBG remain poorly controlled despite levemir and meal coverage- Dr. Excell Seltzer has started an insulin drip I reviewed his films again- I do not see a good target for a radial graft, so will use LIMA and SV All questions answered   LOS:  4 days    Danny [name],Danny [name] 03/27/2014

## 2014-03-27 NOTE — Progress Notes (Signed)
Progress Note  Subjective:    Complaining of not sleeping well and feeling tired and weak. Asleep in chair this AM. No chest pain. Has dry cough and end expiratory wheeze on exam.   Objective:   Temp:  [98.5 F (36.9 C)-100.1 F (37.8 C)] 98.5 F (36.9 C) (02/10 0406) Pulse Rate:  [84-95] 89 (02/10 0406) Resp:  [17-22] 18 (02/10 0406) BP: (126-140)/(57-87) 126/74 mmHg (02/10 0406) SpO2:  [92 %-95 %] 94 % (02/10 0406) Weight:  [208 lb 5.4 oz (94.5 kg)] 208 lb 5.4 oz (94.5 kg) (02/10 0406) Last BM Date: 03/26/14  Filed Weights   03/25/14 0400 03/26/14 0413 03/27/14 0406  Weight: 206 lb 5.6 oz (93.6 kg) 207 lb 0.2 oz (93.9 kg) 208 lb 5.4 oz (94.5 kg)    Intake/Output Summary (Last 24 hours) at 03/27/14 0755 Last data filed at 03/27/14 0600  Gross per 24 hour  Intake   1560 ml  Output   1550 ml  Net     10 ml    Telemetry: NSR  Physical Exam: General: White male, alert, cooperative, NAD. Resting Comfortably.  HEENT: PERRL, EOMI. Moist mucus membranes Neck: Full range of motion without pain, supple, no lymphadenopathy or carotid bruits. Lungs: Air entry equal bilaterally, no rales, mild end-end expiratory wheeze.  Heart: RRR, no gallop. Mild friction rub present on exam.  Abdomen: Soft, non-tender, non-distended, BS + Extremities: No cyanosis, clubbing, or edema.  Neurologic: Alert & oriented x3, cranial nerves II-XII intact, strength grossly intact, sensation intact to light touch   Lab Results:  Basic Metabolic Panel:  Recent Labs Lab 03/24/14 0314 03/25/14 0320 03/27/14 0240  NA 133* 128* 126*  K 3.7 3.8 4.2  CL 99 99 96  CO2 20 23 21   GLUCOSE 314* 263* 267*  BUN 16 23 17   CREATININE 1.26 1.31 0.96  CALCIUM 8.1* 7.7* 7.6*    CBC:  Recent Labs Lab 03/23/14 0903 03/24/14 0314 03/25/14 0320  WBC 11.7* 12.3* 9.2  HGB 13.6 13.4 11.8*  HCT 38.0* 38.4* 34.2*  MCV 83.7 85.3 85.5  PLT 207 219 177    Cardiac Enzymes:  Recent Labs Lab  03/23/14 0903 03/23/14 1600 03/23/14 2110  TROPONINI 6.78* 9.05* 8.68*     Coagulation:  Recent Labs Lab 03/23/14 0903  INR 1.19     ECG: NSR w/ ST elevation, most significant in lateral leads.    Medications:   Scheduled Medications: . aspirin EC  81 mg Oral Daily  . atorvastatin  80 mg Oral q1800  . carvedilol  6.25 mg Oral BID WC  . insulin aspart  0-20 Units Subcutaneous TID WC  . insulin aspart  0-5 Units Subcutaneous QHS  . insulin aspart  7 Units Subcutaneous TID WC  . insulin detemir  24 Units Subcutaneous QHS    Infusions: . heparin 2,100 Units/hr (03/27/14 0150)  . nitroGLYCERIN 30 mcg/min (03/26/14 2000)    PRN Medications: acetaminophen, ALPRAZolam, alum & mag hydroxide-simeth, morphine injection, nitroGLYCERIN, ondansetron (ZOFRAN) IV, oxyCODONE-acetaminophen   Assessment and Plan:  59 y/o M w/ PMHx of DM type II, admitted 03/23/14 w/ lateral STEMI.   Lateral STEMI: Cardiac catheterization from 03/23/14 showed severe multivessel coronary artery disease with severe stenosis of the proximal circumflex, mid LAD, proximal first diagonal branch, and right PDA. Seen by Dr. Dorris Fetch, plan for CABG Thursday. Patient w/ friction rub on exam, likely from post-MI pericarditis. No chest pain.  -Continue Heparin gtt -Nitroglycerin gtt -ASA -Continue Coreg 6.25 mg  bid + Lisinopril 2.5 mg daily.  -Lipitor 80 mg qhs -CABG per CVTS  DM type II: Still elevated CBG's, trend as follows:   Recent Labs Lab 03/25/14 1711 03/25/14 2133 03/26/14 1250 03/26/14 1824 03/26/14 2123  GLUCAP 254* 256* 265* 236* 250*  -Continue ISS-R w/ HS coverage. Increase Novolog to 9 units tid -Increase Levemir to 28 units  Hyponatremia: Na continues to decrease, now 126. Unclear etiology, most likely related to Lasix. May also be contributing to weakness.  -Holding Lasix for now.  -Fluid restriction -Urine osmolality, urine sodium, serum osmolality  Generalized weakness: CT head  performed yesterday negative. May be related to hyponatremia vs deconditioning.  -Hyponatremia workup as above -PT eval/cardiac rehab  Mild Systolic CHF: ECHO w/ EF of 50%.  -Hold Lasix for now given worsening hyponatremia -Coreg as above    Lauris Chroman, MD PGY-2 Internal Medicine Pager: [phone]  Patient seen, examined. Available data reviewed. Agree with findings, assessment, and plan as outlined by Lauris Chroman, MD. Pt independently interviewed and examined. He is A&Ox4, NAD, lungs with diffuse rhonchi, heart RRR with a friction rub, no peripheral edema. CT head yesterday without acute abnormality. Glycemic control remains poor. Levemir added this am. Will touch base with Dr Dorris Fetch to see if he wants insulin gtt started prior to going to OR tomorrow. Will wean off IV NTG as he has been CP-free > 48 hours.  Tonny Bollman, M.D. 03/27/2014 10:11 AM

## 2014-03-28 ENCOUNTER — Inpatient Hospital Stay (HOSPITAL_COMMUNITY): Payer: No Typology Code available for payment source

## 2014-03-28 ENCOUNTER — Inpatient Hospital Stay (HOSPITAL_COMMUNITY): Payer: No Typology Code available for payment source | Admitting: Anesthesiology

## 2014-03-28 ENCOUNTER — Encounter (HOSPITAL_COMMUNITY)
Admission: EM | Disposition: A | Discharge status: Home / Self Care | Payer: No Typology Code available for payment source | Source: Ambulatory Visit | Attending: Cardiovascular Disease

## 2014-03-28 DIAGNOSIS — Z951 Presence of aortocoronary bypass graft: Secondary | ICD-10-CM

## 2014-03-28 HISTORY — PX: TEE WITHOUT CARDIOVERSION: SHX5443

## 2014-03-28 HISTORY — PX: CORONARY ARTERY BYPASS GRAFT: SHX141

## 2014-03-28 LAB — POCT I-STAT, CHEM 8
BUN: 15 mg/dL (ref 6–23)
BUN: 15 mg/dL (ref 6–23)
BUN: 24 mg/dL — AB (ref 6–23)
BUN: 17 mg/dL (ref 6–23)
BUN: 16 mg/dL (ref 6–23)
BUN: 17 mg/dL (ref 6–23)
CALCIUM ION: 1.12 mmol/L (ref 1.12–1.23)
CALCIUM ION: 1.1 mmol/L — AB (ref 1.12–1.23)
CHLORIDE: 94 mmol/L — AB (ref 96–112)
CHLORIDE: 100 mmol/L (ref 96–112)
CREATININE: 0.9 mg/dL (ref 0.50–1.35)
Calcium, Ion: 1.02 mmol/L — ABNORMAL LOW (ref 1.12–1.23)
Calcium, Ion: 1.06 mmol/L — ABNORMAL LOW (ref 1.12–1.23)
Calcium, Ion: 1.04 mmol/L — ABNORMAL LOW (ref 1.12–1.23)
Calcium, Ion: 1.04 mmol/L — ABNORMAL LOW (ref 1.12–1.23)
Chloride: 97 mmol/L (ref 96–112)
Chloride: 90 mmol/L — ABNORMAL LOW (ref 96–112)
Chloride: 95 mmol/L — ABNORMAL LOW (ref 96–112)
Chloride: 94 mmol/L — ABNORMAL LOW (ref 96–112)
Creatinine, Ser: 0.8 mg/dL (ref 0.50–1.35)
Creatinine, Ser: 0.7 mg/dL (ref 0.50–1.35)
Creatinine, Ser: 0.8 mg/dL (ref 0.50–1.35)
Creatinine, Ser: 0.9 mg/dL (ref 0.50–1.35)
Creatinine, Ser: 0.8 mg/dL (ref 0.50–1.35)
GLUCOSE: 177 mg/dL — AB (ref 70–99)
Glucose, Bld: 152 mg/dL — ABNORMAL HIGH (ref 70–99)
Glucose, Bld: 161 mg/dL — ABNORMAL HIGH (ref 70–99)
Glucose, Bld: 182 mg/dL — ABNORMAL HIGH (ref 70–99)
Glucose, Bld: 197 mg/dL — ABNORMAL HIGH (ref 70–99)
Glucose, Bld: 116 mg/dL — ABNORMAL HIGH (ref 70–99)
HCT: 29 % — ABNORMAL LOW (ref 39.0–52.0)
HCT: 34 % — ABNORMAL LOW (ref 39.0–52.0)
HCT: 25 % — ABNORMAL LOW (ref 39.0–52.0)
HCT: 26 % — ABNORMAL LOW (ref 39.0–52.0)
HCT: 36 % — ABNORMAL LOW (ref 39.0–52.0)
HEMATOCRIT: 37 % — AB (ref 39.0–52.0)
HEMOGLOBIN: 9.9 g/dL — AB (ref 13.0–17.0)
HEMOGLOBIN: 8.8 g/dL — AB (ref 13.0–17.0)
HEMOGLOBIN: 12.2 g/dL — AB (ref 13.0–17.0)
Hemoglobin: 12.6 g/dL — ABNORMAL LOW (ref 13.0–17.0)
Hemoglobin: 11.6 g/dL — ABNORMAL LOW (ref 13.0–17.0)
Hemoglobin: 8.5 g/dL — ABNORMAL LOW (ref 13.0–17.0)
POTASSIUM: 5.1 mmol/L (ref 3.5–5.1)
POTASSIUM: 4.7 mmol/L (ref 3.5–5.1)
Potassium: 4.1 mmol/L (ref 3.5–5.1)
Potassium: 3.9 mmol/L (ref 3.5–5.1)
Potassium: 5.8 mmol/L — ABNORMAL HIGH (ref 3.5–5.1)
Potassium: 6.2 mmol/L (ref 3.5–5.1)
SODIUM: 128 mmol/L — AB (ref 135–145)
SODIUM: 125 mmol/L — AB (ref 135–145)
SODIUM: 127 mmol/L — AB (ref 135–145)
SODIUM: 127 mmol/L — AB (ref 135–145)
Sodium: 124 mmol/L — ABNORMAL LOW (ref 135–145)
Sodium: 131 mmol/L — ABNORMAL LOW (ref 135–145)
TCO2: 18 mmol/L (ref 0–100)
TCO2: 20 mmol/L (ref 0–100)
TCO2: 22 mmol/L (ref 0–100)
TCO2: 20 mmol/L (ref 0–100)
TCO2: 19 mmol/L (ref 0–100)
TCO2: 18 mmol/L (ref 0–100)

## 2014-03-28 LAB — POCT I-STAT 3, ART BLOOD GAS (G3+)
ACID-BASE DEFICIT: 2 mmol/L (ref 0.0–2.0)
ACID-BASE DEFICIT: 4 mmol/L — AB (ref 0.0–2.0)
Acid-base deficit: 5 mmol/L — ABNORMAL HIGH (ref 0.0–2.0)
Acid-base deficit: 6 mmol/L — ABNORMAL HIGH (ref 0.0–2.0)
Acid-base deficit: 5 mmol/L — ABNORMAL HIGH (ref 0.0–2.0)
Acid-base deficit: 6 mmol/L — ABNORMAL HIGH (ref 0.0–2.0)
BICARBONATE: 23 meq/L (ref 20.0–24.0)
BICARBONATE: 21.9 meq/L (ref 20.0–24.0)
BICARBONATE: 19.6 meq/L — AB (ref 20.0–24.0)
Bicarbonate: 21.6 mEq/L (ref 20.0–24.0)
Bicarbonate: 19 mEq/L — ABNORMAL LOW (ref 20.0–24.0)
Bicarbonate: 19.6 mEq/L — ABNORMAL LOW (ref 20.0–24.0)
O2 SAT: 100 %
O2 Saturation: 86 %
O2 Saturation: 97 %
O2 Saturation: 97 %
O2 Saturation: 96 %
O2 Saturation: 96 %
PCO2 ART: 40.9 mmHg (ref 35.0–45.0)
PCO2 ART: 40 mmHg (ref 35.0–45.0)
PCO2 ART: 35.9 mmHg (ref 35.0–45.0)
PH ART: 7.326 — AB (ref 7.350–7.450)
PH ART: 7.346 — AB (ref 7.350–7.450)
PH ART: 7.298 — AB (ref 7.350–7.450)
PH ART: 7.309 — AB (ref 7.350–7.450)
PO2 ART: 95 mmHg (ref 80.0–100.0)
PO2 ART: 103 mmHg — AB (ref 80.0–100.0)
PO2 ART: 92 mmHg (ref 80.0–100.0)
Patient temperature: 36.1
Patient temperature: 37.9
Patient temperature: 38.4
TCO2: 24 mmol/L (ref 0–100)
TCO2: 23 mmol/L (ref 0–100)
TCO2: 23 mmol/L (ref 0–100)
TCO2: 20 mmol/L (ref 0–100)
TCO2: 21 mmol/L (ref 0–100)
TCO2: 21 mmol/L (ref 0–100)
pCO2 arterial: 41.6 mmHg (ref 35.0–45.0)
pCO2 arterial: 42.1 mmHg (ref 35.0–45.0)
pCO2 arterial: 39.7 mmHg (ref 35.0–45.0)
pH, Arterial: 7.351 (ref 7.350–7.450)
pH, Arterial: 7.335 — ABNORMAL LOW (ref 7.350–7.450)
pO2, Arterial: 262 mmHg — ABNORMAL HIGH (ref 80.0–100.0)
pO2, Arterial: 52 mmHg — ABNORMAL LOW (ref 80.0–100.0)
pO2, Arterial: 116 mmHg — ABNORMAL HIGH (ref 80.0–100.0)

## 2014-03-28 LAB — APTT: aPTT: 37 seconds (ref 24–37)

## 2014-03-28 LAB — GLUCOSE, CAPILLARY
GLUCOSE-CAPILLARY: 119 mg/dL — AB (ref 70–99)
GLUCOSE-CAPILLARY: 131 mg/dL — AB (ref 70–99)
GLUCOSE-CAPILLARY: 154 mg/dL — AB (ref 70–99)
GLUCOSE-CAPILLARY: 135 mg/dL — AB (ref 70–99)
GLUCOSE-CAPILLARY: 101 mg/dL — AB (ref 70–99)
Glucose-Capillary: 103 mg/dL — ABNORMAL HIGH (ref 70–99)
Glucose-Capillary: 109 mg/dL — ABNORMAL HIGH (ref 70–99)
Glucose-Capillary: 157 mg/dL — ABNORMAL HIGH (ref 70–99)
Glucose-Capillary: 164 mg/dL — ABNORMAL HIGH (ref 70–99)
Glucose-Capillary: 115 mg/dL — ABNORMAL HIGH (ref 70–99)
Glucose-Capillary: 152 mg/dL — ABNORMAL HIGH (ref 70–99)
Glucose-Capillary: 168 mg/dL — ABNORMAL HIGH (ref 70–99)
Glucose-Capillary: 107 mg/dL — ABNORMAL HIGH (ref 70–99)
Glucose-Capillary: 116 mg/dL — ABNORMAL HIGH (ref 70–99)

## 2014-03-28 LAB — BASIC METABOLIC PANEL
Anion gap: 5 (ref 5–15)
BUN: 16 mg/dL (ref 6–23)
CO2: 26 mmol/L (ref 19–32)
Calcium: 8 mg/dL — ABNORMAL LOW (ref 8.4–10.5)
Chloride: 96 mmol/L (ref 96–112)
Creatinine, Ser: 0.99 mg/dL (ref 0.50–1.35)
GFR calc Af Amer: >90 mL/min (ref >90)
GFR calc non Af Amer: 88 mL/min — ABNORMAL LOW (ref >90)
GLUCOSE: 129 mg/dL — AB (ref 70–99)
POTASSIUM: 4 mmol/L (ref 3.5–5.1)
Sodium: 127 mmol/L — ABNORMAL LOW (ref 135–145)

## 2014-03-28 LAB — POCT I-STAT 4, (NA,K, GLUC, HGB,HCT)
Glucose, Bld: 199 mg/dL — ABNORMAL HIGH (ref 70–99)
HCT: 36 % — ABNORMAL LOW (ref 39.0–52.0)
Hemoglobin: 12.2 g/dL — ABNORMAL LOW (ref 13.0–17.0)
POTASSIUM: 4.7 mmol/L (ref 3.5–5.1)
Sodium: 124 mmol/L — ABNORMAL LOW (ref 135–145)

## 2014-03-28 LAB — CBC
HCT: 35.4 % — ABNORMAL LOW (ref 39.0–52.0)
HCT: 32.2 % — ABNORMAL LOW (ref 39.0–52.0)
HEMOGLOBIN: 12.5 g/dL — AB (ref 13.0–17.0)
Hemoglobin: 11.3 g/dL — ABNORMAL LOW (ref 13.0–17.0)
MCH: 30 pg (ref 26.0–34.0)
MCH: 29.6 pg (ref 26.0–34.0)
MCHC: 35.3 g/dL (ref 30.0–36.0)
MCHC: 35.1 g/dL (ref 30.0–36.0)
MCV: 84.9 fL (ref 78.0–100.0)
MCV: 84.3 fL (ref 78.0–100.0)
PLATELETS: 216 10*3/uL (ref 150–400)
Platelets: 191 10*3/uL (ref 150–400)
RBC: 4.17 MIL/uL — AB (ref 4.22–5.81)
RBC: 3.82 MIL/uL — ABNORMAL LOW (ref 4.22–5.81)
RDW: 12.7 % (ref 11.5–15.5)
RDW: 12.8 % (ref 11.5–15.5)
WBC: 16.3 10*3/uL — ABNORMAL HIGH (ref 4.0–10.5)
WBC: 10.7 10*3/uL — AB (ref 4.0–10.5)

## 2014-03-28 LAB — CREATININE, SERUM
Creatinine, Ser: 0.88 mg/dL (ref 0.50–1.35)
GFR calc Af Amer: >90 mL/min (ref >90)
GFR calc non Af Amer: >90 mL/min (ref >90)

## 2014-03-28 LAB — PROTIME-INR
INR: 1.42 (ref 0.00–1.49)
PROTHROMBIN TIME: 17.5 s — AB (ref 11.6–15.2)

## 2014-03-28 LAB — PLATELET COUNT: Platelets: 169 10*3/uL (ref 150–400)

## 2014-03-28 LAB — POCT I-STAT GLUCOSE
Glucose, Bld: 176 mg/dL — ABNORMAL HIGH (ref 70–99)
OPERATOR ID: 190282

## 2014-03-28 LAB — HEMOGLOBIN AND HEMATOCRIT, BLOOD
HCT: 22.4 % — ABNORMAL LOW (ref 39.0–52.0)
Hemoglobin: 7.9 g/dL — ABNORMAL LOW (ref 13.0–17.0)

## 2014-03-28 LAB — MAGNESIUM: MAGNESIUM: 3.1 mg/dL — AB (ref 1.5–2.5)

## 2014-03-28 LAB — HEPARIN LEVEL (UNFRACTIONATED)

## 2014-03-28 SURGERY — CORONARY ARTERY BYPASS GRAFTING (CABG)
Anesthesia: General | Site: Chest

## 2014-03-28 MED ORDER — SODIUM CHLORIDE 0.9 % IV SOLN
INTRAVENOUS | Status: DC
  Administered 2014-03-28: 20 mL/h via INTRAVENOUS

## 2014-03-28 MED ORDER — NOREPINEPHRINE BITARTRATE 1 MG/ML IV SOLN
0.0000 ug/min | INTRAVENOUS | Status: DC
  Filled 2014-03-28: qty 4

## 2014-03-28 MED ORDER — ALBUMIN HUMAN 5 % IV SOLN
INTRAVENOUS | Status: DC | PRN
  Administered 2014-03-28: 12:00:00 via INTRAVENOUS

## 2014-03-28 MED ORDER — LACTATED RINGERS IV SOLN: INTRAVENOUS | Status: DC

## 2014-03-28 MED ORDER — ACETAMINOPHEN 160 MG/5ML PO SOLN
1000.0000 mg | Freq: Four times a day (QID) | ORAL | Status: DC
  Filled 2014-03-28: qty 40

## 2014-03-28 MED ORDER — CETYLPYRIDINIUM CHLORIDE 0.05 % MT LIQD
7.0000 mL | Freq: Four times a day (QID) | OROMUCOSAL | Status: DC
  Administered 2014-03-28 – 2014-03-29 (×3): 7 mL via OROMUCOSAL

## 2014-03-28 MED ORDER — ACETAMINOPHEN 500 MG PO TABS
1000.0000 mg | ORAL_TABLET | Freq: Four times a day (QID) | ORAL | Status: DC
  Administered 2014-03-29 – 2014-03-30 (×7): 1000 mg via ORAL
  Filled 2014-03-28 (×11): qty 2

## 2014-03-28 MED ORDER — DEXTROSE 5 % IV SOLN
0.0000 ug/min | INTRAVENOUS | Status: DC
  Administered 2014-03-28: 30 ug/min via INTRAVENOUS
  Filled 2014-03-28: qty 2

## 2014-03-28 MED ORDER — PROTAMINE SULFATE 10 MG/ML IV SOLN
INTRAVENOUS | Status: AC
  Filled 2014-03-28: qty 25

## 2014-03-28 MED ORDER — DEXMEDETOMIDINE HCL IN NACL 200 MCG/50ML IV SOLN
0.0000 ug/kg/h | INTRAVENOUS | Status: DC
  Administered 2014-03-28: 0.7 ug/kg/h via INTRAVENOUS
  Administered 2014-03-28: 0.5 ug/kg/h via INTRAVENOUS
  Filled 2014-03-28 (×3): qty 50

## 2014-03-28 MED ORDER — EPHEDRINE SULFATE 50 MG/ML IJ SOLN
INTRAMUSCULAR | Status: AC
  Filled 2014-03-28: qty 1

## 2014-03-28 MED ORDER — BISACODYL 10 MG RE SUPP: 10.0000 mg | Freq: Every day | RECTAL | Status: DC

## 2014-03-28 MED ORDER — VECURONIUM BROMIDE 10 MG IV SOLR
INTRAVENOUS | Status: AC
  Filled 2014-03-28: qty 10

## 2014-03-28 MED ORDER — ARTIFICIAL TEARS OP OINT
TOPICAL_OINTMENT | OPHTHALMIC | Status: DC | PRN
  Administered 2014-03-28: 1 via OPHTHALMIC

## 2014-03-28 MED ORDER — METOCLOPRAMIDE HCL 5 MG/ML IJ SOLN
10.0000 mg | Freq: Four times a day (QID) | INTRAMUSCULAR | Status: AC
  Administered 2014-03-28 – 2014-03-29 (×4): 10 mg via INTRAVENOUS
  Filled 2014-03-28 (×3): qty 2

## 2014-03-28 MED ORDER — SODIUM BICARBONATE 8.4 % IV SOLN
50.0000 meq | Freq: Once | INTRAVENOUS | Status: AC
  Administered 2014-03-28: 50 meq via INTRAVENOUS
  Filled 2014-03-28: qty 50

## 2014-03-28 MED ORDER — SUCCINYLCHOLINE CHLORIDE 20 MG/ML IJ SOLN
INTRAMUSCULAR | Status: AC
  Filled 2014-03-28: qty 1

## 2014-03-28 MED ORDER — MIDAZOLAM HCL 2 MG/2ML IJ SOLN
INTRAMUSCULAR | Status: AC
  Filled 2014-03-28: qty 2

## 2014-03-28 MED ORDER — ACETAMINOPHEN 650 MG RE SUPP
650.0000 mg | Freq: Once | RECTAL | Status: AC
  Administered 2014-03-28: 650 mg via RECTAL

## 2014-03-28 MED ORDER — MIDAZOLAM HCL 2 MG/2ML IJ SOLN
2.0000 mg | INTRAMUSCULAR | Status: DC | PRN
  Administered 2014-03-28 (×2): 2 mg via INTRAVENOUS
  Filled 2014-03-28 (×2): qty 2

## 2014-03-28 MED ORDER — PROPOFOL 10 MG/ML IV BOLUS
INTRAVENOUS | Status: AC
  Filled 2014-03-28: qty 20

## 2014-03-28 MED ORDER — SODIUM CHLORIDE 0.9 % IJ SOLN
3.0000 mL | Freq: Two times a day (BID) | INTRAMUSCULAR | Status: DC
  Administered 2014-03-29 – 2014-03-30 (×3): 3 mL via INTRAVENOUS

## 2014-03-28 MED ORDER — ALBUMIN HUMAN 5 % IV SOLN
250.0000 mL | INTRAVENOUS | Status: AC | PRN
  Administered 2014-03-28 (×2): 250 mL via INTRAVENOUS

## 2014-03-28 MED ORDER — DEXTROSE 5 % IV SOLN
1.5000 g | Freq: Two times a day (BID) | INTRAVENOUS | Status: AC
  Administered 2014-03-28 – 2014-03-30 (×4): 1.5 g via INTRAVENOUS
  Filled 2014-03-28 (×4): qty 1.5

## 2014-03-28 MED ORDER — PANTOPRAZOLE SODIUM 40 MG PO TBEC
40.0000 mg | DELAYED_RELEASE_TABLET | Freq: Every day | ORAL | Status: DC
  Administered 2014-03-30: 40 mg via ORAL
  Filled 2014-03-28: qty 1

## 2014-03-28 MED ORDER — PROTAMINE SULFATE 10 MG/ML IV SOLN
INTRAVENOUS | Status: AC
  Filled 2014-03-28: qty 10

## 2014-03-28 MED ORDER — ROCURONIUM BROMIDE 50 MG/5ML IV SOLN
INTRAVENOUS | Status: AC
  Filled 2014-03-28: qty 1

## 2014-03-28 MED ORDER — MORPHINE SULFATE 2 MG/ML IJ SOLN
2.0000 mg | INTRAMUSCULAR | Status: DC | PRN
  Administered 2014-03-29 (×2): 4 mg via INTRAVENOUS
  Filled 2014-03-28 (×2): qty 2
  Filled 2014-03-28: qty 1
  Filled 2014-03-28: qty 2

## 2014-03-28 MED ORDER — SODIUM CHLORIDE 0.45 % IV SOLN
INTRAVENOUS | Status: DC
Start: 2014-03-28 — End: 2014-03-30
  Administered 2014-03-28: 10 mL/h via INTRAVENOUS

## 2014-03-28 MED ORDER — MIDAZOLAM HCL 5 MG/5ML IJ SOLN
INTRAMUSCULAR | Status: DC | PRN
  Administered 2014-03-28 (×3): 1 mg via INTRAVENOUS
  Administered 2014-03-28 (×2): 2 mg via INTRAVENOUS
  Administered 2014-03-28: 3 mg via INTRAVENOUS

## 2014-03-28 MED ORDER — ARTIFICIAL TEARS OP OINT
TOPICAL_OINTMENT | OPHTHALMIC | Status: AC
  Filled 2014-03-28: qty 3.5

## 2014-03-28 MED ORDER — ONDANSETRON HCL 4 MG/2ML IJ SOLN: 4.0000 mg | Freq: Four times a day (QID) | INTRAMUSCULAR | Status: DC | PRN

## 2014-03-28 MED ORDER — CHLORHEXIDINE GLUCONATE 0.12 % MT SOLN
15.0000 mL | Freq: Two times a day (BID) | OROMUCOSAL | Status: DC
  Administered 2014-03-28: 15 mL via OROMUCOSAL
  Filled 2014-03-28: qty 15

## 2014-03-28 MED ORDER — ASPIRIN 81 MG PO CHEW: 324.0000 mg | CHEWABLE_TABLET | Freq: Every day | ORAL | Status: DC

## 2014-03-28 MED ORDER — BISACODYL 5 MG PO TBEC
10.0000 mg | DELAYED_RELEASE_TABLET | Freq: Every day | ORAL | Status: DC
  Administered 2014-03-29 – 2014-03-30 (×2): 10 mg via ORAL
  Filled 2014-03-28 (×2): qty 2

## 2014-03-28 MED ORDER — FENTANYL CITRATE 0.05 MG/ML IJ SOLN
INTRAMUSCULAR | Status: AC
  Filled 2014-03-28: qty 5

## 2014-03-28 MED ORDER — SODIUM CHLORIDE 0.9 % IJ SOLN: 3.0000 mL | INTRAMUSCULAR | Status: DC | PRN

## 2014-03-28 MED ORDER — DOCUSATE SODIUM 100 MG PO CAPS
200.0000 mg | ORAL_CAPSULE | Freq: Every day | ORAL | Status: DC
  Administered 2014-03-29 – 2014-03-30 (×2): 200 mg via ORAL
  Filled 2014-03-28 (×2): qty 2

## 2014-03-28 MED ORDER — SODIUM CHLORIDE 0.9 % IJ SOLN
OROMUCOSAL | Status: DC | PRN
  Administered 2014-03-28: 12 mL via TOPICAL

## 2014-03-28 MED ORDER — OXYCODONE HCL 5 MG PO TABS
5.0000 mg | ORAL_TABLET | ORAL | Status: DC | PRN
  Administered 2014-03-29 (×4): 10 mg via ORAL
  Administered 2014-03-29: 5 mg via ORAL
  Administered 2014-03-29: 10 mg via ORAL
  Administered 2014-03-30: 5 mg via ORAL
  Filled 2014-03-28 (×2): qty 2
  Filled 2014-03-28: qty 1
  Filled 2014-03-28 (×2): qty 2
  Filled 2014-03-28: qty 1
  Filled 2014-03-28: qty 2

## 2014-03-28 MED ORDER — HEMOSTATIC AGENTS (NO CHARGE) OPTIME
TOPICAL | Status: DC | PRN
  Administered 2014-03-28: 1 via TOPICAL

## 2014-03-28 MED ORDER — LIDOCAINE HCL (CARDIAC) 20 MG/ML IV SOLN
INTRAVENOUS | Status: AC
  Filled 2014-03-28: qty 5

## 2014-03-28 MED ORDER — LACTATED RINGERS IV SOLN
INTRAVENOUS | Status: DC | PRN
  Administered 2014-03-28: 07:00:00 via INTRAVENOUS

## 2014-03-28 MED ORDER — METOPROLOL TARTRATE 1 MG/ML IV SOLN: 2.5000 mg | INTRAVENOUS | Status: DC | PRN

## 2014-03-28 MED ORDER — METOCLOPRAMIDE HCL 5 MG/ML IJ SOLN
10.0000 mg | Freq: Four times a day (QID) | INTRAMUSCULAR | Status: DC
  Filled 2014-03-28: qty 2

## 2014-03-28 MED ORDER — DOPAMINE-DEXTROSE 3.2-5 MG/ML-% IV SOLN
INTRAVENOUS | Status: DC | PRN
  Administered 2014-03-28: 3 ug/kg/min via INTRAVENOUS

## 2014-03-28 MED ORDER — ALBUTEROL SULFATE (2.5 MG/3ML) 0.083% IN NEBU
2.5000 mg | INHALATION_SOLUTION | RESPIRATORY_TRACT | Status: DC | PRN
  Administered 2014-03-28 – 2014-03-30 (×2): 2.5 mg via RESPIRATORY_TRACT
  Filled 2014-03-28: qty 3

## 2014-03-28 MED ORDER — ROCURONIUM BROMIDE 100 MG/10ML IV SOLN
INTRAVENOUS | Status: DC | PRN
  Administered 2014-03-28: 50 mg via INTRAVENOUS

## 2014-03-28 MED ORDER — ALBUTEROL SULFATE (2.5 MG/3ML) 0.083% IN NEBU
INHALATION_SOLUTION | RESPIRATORY_TRACT | Status: AC
  Administered 2014-03-28: 14:00:00
  Filled 2014-03-28: qty 3

## 2014-03-28 MED ORDER — VANCOMYCIN HCL IN DEXTROSE 1-5 GM/200ML-% IV SOLN
1000.0000 mg | Freq: Once | INTRAVENOUS | Status: AC
  Administered 2014-03-28: 1000 mg via INTRAVENOUS
  Filled 2014-03-28: qty 200

## 2014-03-28 MED ORDER — TRAMADOL HCL 50 MG PO TABS: 50.0000 mg | ORAL_TABLET | ORAL | Status: DC | PRN

## 2014-03-28 MED ORDER — PROTAMINE SULFATE 10 MG/ML IV SOLN
INTRAVENOUS | Status: DC | PRN
  Administered 2014-03-28: 330 mg via INTRAVENOUS

## 2014-03-28 MED ORDER — SODIUM CHLORIDE 0.9 % IV SOLN
INTRAVENOUS | Status: DC
  Administered 2014-03-28: 9.7 [IU]/h via INTRAVENOUS
  Administered 2014-03-29: 1.5 [IU]/h via INTRAVENOUS
  Administered 2014-03-29: 16:00:00 via INTRAVENOUS
  Filled 2014-03-28 (×2): qty 2.5

## 2014-03-28 MED ORDER — HEPARIN SODIUM (PORCINE) 1000 UNIT/ML IJ SOLN
INTRAMUSCULAR | Status: AC
  Filled 2014-03-28: qty 1

## 2014-03-28 MED ORDER — NITROGLYCERIN IN D5W 200-5 MCG/ML-% IV SOLN
0.0000 ug/min | INTRAVENOUS | Status: DC
  Administered 2014-03-28: 5 ug/min via INTRAVENOUS

## 2014-03-28 MED ORDER — METOPROLOL TARTRATE 12.5 MG HALF TABLET
12.5000 mg | ORAL_TABLET | Freq: Two times a day (BID) | ORAL | Status: DC
  Administered 2014-03-29 – 2014-03-30 (×3): 12.5 mg via ORAL
  Filled 2014-03-28 (×5): qty 1

## 2014-03-28 MED ORDER — INSULIN REGULAR BOLUS VIA INFUSION
0.0000 [IU] | Freq: Three times a day (TID) | INTRAVENOUS | Status: DC
  Administered 2014-03-29: 3 [IU] via INTRAVENOUS
  Administered 2014-03-29: 1 [IU] via INTRAVENOUS
  Administered 2014-03-29: 3 [IU] via INTRAVENOUS
  Administered 2014-03-30: 1.7 [IU] via INTRAVENOUS
  Administered 2014-03-30: 3 [IU] via INTRAVENOUS
  Filled 2014-03-28: qty 10

## 2014-03-28 MED ORDER — MAGNESIUM SULFATE 4 GM/100ML IV SOLN
4.0000 g | Freq: Once | INTRAVENOUS | Status: AC
  Administered 2014-03-28: 4 g via INTRAVENOUS
  Filled 2014-03-28: qty 100

## 2014-03-28 MED ORDER — STERILE WATER FOR INJECTION IJ SOLN
INTRAMUSCULAR | Status: AC
  Filled 2014-03-28: qty 10

## 2014-03-28 MED ORDER — 0.9 % SODIUM CHLORIDE (POUR BTL) OPTIME
TOPICAL | Status: DC | PRN
  Administered 2014-03-28: 6000 mL

## 2014-03-28 MED ORDER — ASPIRIN EC 325 MG PO TBEC
325.0000 mg | DELAYED_RELEASE_TABLET | Freq: Every day | ORAL | Status: DC
  Administered 2014-03-29 – 2014-03-30 (×2): 325 mg via ORAL
  Filled 2014-03-28 (×2): qty 1

## 2014-03-28 MED ORDER — DOPAMINE-DEXTROSE 3.2-5 MG/ML-% IV SOLN
0.0000 ug/kg/min | INTRAVENOUS | Status: DC
  Administered 2014-03-28: 5 ug/kg/min via INTRAVENOUS

## 2014-03-28 MED ORDER — FENTANYL CITRATE 0.05 MG/ML IJ SOLN
INTRAMUSCULAR | Status: DC | PRN
  Administered 2014-03-28: 100 ug via INTRAVENOUS
  Administered 2014-03-28: 250 ug via INTRAVENOUS
  Administered 2014-03-28: 100 ug via INTRAVENOUS
  Administered 2014-03-28: 150 ug via INTRAVENOUS
  Administered 2014-03-28: 250 ug via INTRAVENOUS

## 2014-03-28 MED ORDER — POTASSIUM CHLORIDE 10 MEQ/50ML IV SOLN: 10.0000 meq | INTRAVENOUS | Status: AC

## 2014-03-28 MED ORDER — SODIUM CHLORIDE 0.9 % IV SOLN: 250.0000 mL | INTRAVENOUS | Status: DC

## 2014-03-28 MED ORDER — VECURONIUM BROMIDE 10 MG IV SOLR
INTRAVENOUS | Status: DC | PRN
  Administered 2014-03-28 (×3): 5 mg via INTRAVENOUS

## 2014-03-28 MED ORDER — MIDAZOLAM HCL 10 MG/2ML IJ SOLN
INTRAMUSCULAR | Status: AC
  Filled 2014-03-28: qty 2

## 2014-03-28 MED ORDER — ACETAMINOPHEN 160 MG/5ML PO SOLN: 650.0000 mg | Freq: Once | ORAL | Status: AC

## 2014-03-28 MED ORDER — MORPHINE SULFATE 2 MG/ML IJ SOLN
1.0000 mg | INTRAMUSCULAR | Status: AC | PRN
  Administered 2014-03-28 (×3): 2 mg via INTRAVENOUS
  Filled 2014-03-28 (×2): qty 1

## 2014-03-28 MED ORDER — HEPARIN SODIUM (PORCINE) 1000 UNIT/ML IJ SOLN
INTRAMUSCULAR | Status: DC | PRN
  Administered 2014-03-28: 2000 [IU] via INTRAVENOUS
  Administered 2014-03-28: 30000 [IU] via INTRAVENOUS

## 2014-03-28 MED ORDER — FAMOTIDINE IN NACL 20-0.9 MG/50ML-% IV SOLN
20.0000 mg | Freq: Two times a day (BID) | INTRAVENOUS | Status: AC
  Administered 2014-03-28 (×2): 20 mg via INTRAVENOUS
  Filled 2014-03-28: qty 50

## 2014-03-28 MED ORDER — LACTATED RINGERS IV SOLN: 500.0000 mL | Freq: Once | INTRAVENOUS | Status: AC | PRN

## 2014-03-28 MED ORDER — PROPOFOL 10 MG/ML IV BOLUS
INTRAVENOUS | Status: DC | PRN
  Administered 2014-03-28: 50 mg via INTRAVENOUS

## 2014-03-28 MED ORDER — SODIUM CHLORIDE 0.9 % IJ SOLN
INTRAMUSCULAR | Status: AC
  Filled 2014-03-28: qty 20

## 2014-03-28 MED ORDER — METOPROLOL TARTRATE 25 MG/10 ML ORAL SUSPENSION
12.5000 mg | Freq: Two times a day (BID) | ORAL | Status: DC
  Filled 2014-03-28 (×5): qty 5

## 2014-03-28 SURGICAL SUPPLY — 109 items
APPLIER CLIP 9.375 SM OPEN (CLIP)
ATTRACTOMAT 16X20 MAGNETIC DRP (DRAPES) ×5 IMPLANT
BAG DECANTER FOR FLEXI CONT (MISCELLANEOUS) ×5 IMPLANT
BANDAGE ELASTIC 4 VELCRO ST LF (GAUZE/BANDAGES/DRESSINGS) ×5 IMPLANT
BANDAGE ELASTIC 6 VELCRO ST LF (GAUZE/BANDAGES/DRESSINGS) ×5 IMPLANT
BASKET HEART  (ORDER IN 25'S) (MISCELLANEOUS) ×1
BASKET HEART (ORDER IN 25'S) (MISCELLANEOUS) ×1
BASKET HEART (ORDER IN 25S) (MISCELLANEOUS) ×3 IMPLANT
BLADE STERNUM SYSTEM 6 (BLADE) ×5 IMPLANT
BLADE SURG 15 STRL LF DISP TIS (BLADE) IMPLANT
BLADE SURG 15 STRL SS (BLADE)
BNDG GAUZE ELAST 4 BULKY (GAUZE/BANDAGES/DRESSINGS) ×5 IMPLANT
CANISTER SUCTION 2500CC (MISCELLANEOUS) ×5 IMPLANT
CANNULA EZ GLIDE AORTIC 21FR (CANNULA) ×5 IMPLANT
CARDIAC SUCTION (MISCELLANEOUS) ×5 IMPLANT
CATH CPB KIT HENDRICKSON (MISCELLANEOUS) ×5 IMPLANT
CATH ROBINSON RED A/P 18FR (CATHETERS) ×5 IMPLANT
CATH THORACIC 28FR (CATHETERS) ×5 IMPLANT
CATH THORACIC 36FR (CATHETERS) ×5 IMPLANT
CATH THORACIC 36FR RT ANG (CATHETERS) ×5 IMPLANT
CLIP APPLIE 9.375 SM OPEN (CLIP) IMPLANT
CLIP TI MEDIUM 24 (CLIP) IMPLANT
CLIP TI WIDE RED SMALL 24 (CLIP) ×15 IMPLANT
CONN ST 1/4X3/8  BEN (MISCELLANEOUS) ×4
CONN ST 1/4X3/8 BEN (MISCELLANEOUS) ×6 IMPLANT
COVER MAYO STAND STRL (DRAPES) IMPLANT
COVER SURGICAL LIGHT HANDLE (MISCELLANEOUS) ×5 IMPLANT
CRADLE DONUT ADULT HEAD (MISCELLANEOUS) ×5 IMPLANT
DERMABOND ADHESIVE PROPEN (GAUZE/BANDAGES/DRESSINGS) ×2
DERMABOND ADVANCED .7 DNX6 (GAUZE/BANDAGES/DRESSINGS) ×3 IMPLANT
DEVICE PMI PUNCTURE CLOSURE (MISCELLANEOUS) ×5 IMPLANT
DRAPE CARDIOVASCULAR INCISE (DRAPES) ×2
DRAPE EXTREMITY T 121X128X90 (DRAPE) IMPLANT
DRAPE PROXIMA HALF (DRAPES) ×5 IMPLANT
DRAPE SLUSH/WARMER DISC (DRAPES) ×5 IMPLANT
DRAPE SRG 135X102X78XABS (DRAPES) ×3 IMPLANT
DRSG COVADERM 4X14 (GAUZE/BANDAGES/DRESSINGS) ×5 IMPLANT
ELECT REM PT RETURN 9FT ADLT (ELECTROSURGICAL) ×10
ELECTRODE REM PT RTRN 9FT ADLT (ELECTROSURGICAL) ×6 IMPLANT
GAUZE SPONGE 4X4 12PLY STRL (GAUZE/BANDAGES/DRESSINGS) ×10 IMPLANT
GEL ULTRASOUND 20GR AQUASONIC (MISCELLANEOUS) IMPLANT
GLOVE BIO SURGEON STRL SZ 6 (GLOVE) ×15 IMPLANT
GLOVE BIO SURGEON STRL SZ 6.5 (GLOVE) ×8 IMPLANT
GLOVE BIO SURGEON STRL SZ7 (GLOVE) ×15 IMPLANT
GLOVE BIO SURGEON STRL SZ7.5 (GLOVE) ×10 IMPLANT
GLOVE BIO SURGEONS STRL SZ 6.5 (GLOVE) ×2
GLOVE SURG SIGNA 7.5 PF LTX (GLOVE) ×15 IMPLANT
GOWN STRL REUS W/ TWL LRG LVL3 (GOWN DISPOSABLE) ×24 IMPLANT
GOWN STRL REUS W/ TWL XL LVL3 (GOWN DISPOSABLE) ×6 IMPLANT
GOWN STRL REUS W/TWL LRG LVL3 (GOWN DISPOSABLE) ×16
GOWN STRL REUS W/TWL XL LVL3 (GOWN DISPOSABLE) ×4
HARMONIC SHEARS 14CM COAG (MISCELLANEOUS) ×5 IMPLANT
HEMOSTAT POWDER SURGIFOAM 1G (HEMOSTASIS) ×15 IMPLANT
HEMOSTAT SURGICEL 2X14 (HEMOSTASIS) ×5 IMPLANT
INSERT FOGARTY XLG (MISCELLANEOUS) ×5 IMPLANT
KIT BASIN OR (CUSTOM PROCEDURE TRAY) ×5 IMPLANT
KIT ROOM TURNOVER OR (KITS) ×5 IMPLANT
KIT SUCTION CATH 14FR (SUCTIONS) ×10 IMPLANT
KIT VASOVIEW W/TROCAR VH 2000 (KITS) ×5 IMPLANT
LEAD PACING MYOCARDI (MISCELLANEOUS) ×5 IMPLANT
MARKER GRAFT CORONARY BYPASS (MISCELLANEOUS) ×15 IMPLANT
NS IRRIG 1000ML POUR BTL (IV SOLUTION) ×25 IMPLANT
PACK OPEN HEART (CUSTOM PROCEDURE TRAY) ×5 IMPLANT
PAD ARMBOARD 7.5X6 YLW CONV (MISCELLANEOUS) ×10 IMPLANT
PAD ELECT DEFIB RADIOL ZOLL (MISCELLANEOUS) ×5 IMPLANT
PENCIL BUTTON HOLSTER BLD 10FT (ELECTRODE) ×5 IMPLANT
PUNCH AORTIC ROTATE  4.5MM 8IN (MISCELLANEOUS) ×5 IMPLANT
PUNCH AORTIC ROTATE 4.0MM (MISCELLANEOUS) IMPLANT
PUNCH AORTIC ROTATE 4.5MM 8IN (MISCELLANEOUS) IMPLANT
PUNCH AORTIC ROTATE 5MM 8IN (MISCELLANEOUS) IMPLANT
SET CARDIOPLEGIA MPS 5001102 (MISCELLANEOUS) ×5 IMPLANT
SOLUTION ANTI FOG 6CC (MISCELLANEOUS) ×5 IMPLANT
SPONGE GAUZE 4X4 12PLY STER LF (GAUZE/BANDAGES/DRESSINGS) ×10 IMPLANT
SPONGE LAP 18X18 X RAY DECT (DISPOSABLE) ×5 IMPLANT
SUT BONE WAX W31G (SUTURE) ×5 IMPLANT
SUT MNCRL AB 4-0 PS2 18 (SUTURE) ×5 IMPLANT
SUT PROLENE 3 0 SH DA (SUTURE) ×5 IMPLANT
SUT PROLENE 4 0 RB 1 (SUTURE) ×2
SUT PROLENE 4 0 SH DA (SUTURE) IMPLANT
SUT PROLENE 4-0 RB1 .5 CRCL 36 (SUTURE) ×3 IMPLANT
SUT PROLENE 6 0 C 1 30 (SUTURE) ×10 IMPLANT
SUT PROLENE 7 0 BV 1 (SUTURE) ×5 IMPLANT
SUT PROLENE 7 0 BV1 MDA (SUTURE) ×5 IMPLANT
SUT PROLENE 8 0 BV175 6 (SUTURE) IMPLANT
SUT SILK  1 MH (SUTURE) ×6
SUT SILK 1 MH (SUTURE) ×9 IMPLANT
SUT STEEL 6MS V (SUTURE) ×5 IMPLANT
SUT STEEL STERNAL CCS#1 18IN (SUTURE) IMPLANT
SUT STEEL SZ 6 DBL 3X14 BALL (SUTURE) ×5 IMPLANT
SUT VIC AB 1 CTX 36 (SUTURE) ×4
SUT VIC AB 1 CTX36XBRD ANBCTR (SUTURE) ×6 IMPLANT
SUT VIC AB 2-0 CT1 27 (SUTURE) ×2
SUT VIC AB 2-0 CT1 TAPERPNT 27 (SUTURE) ×3 IMPLANT
SUT VIC AB 2-0 CTX 27 (SUTURE) ×10 IMPLANT
SUT VIC AB 3-0 SH 27 (SUTURE)
SUT VIC AB 3-0 SH 27X BRD (SUTURE) IMPLANT
SUT VIC AB 3-0 X1 27 (SUTURE) ×10 IMPLANT
SUT VICRYL 4-0 PS2 18IN ABS (SUTURE) IMPLANT
SUTURE E-PAK OPEN HEART (SUTURE) ×5 IMPLANT
SYR 50ML SLIP (SYRINGE) IMPLANT
SYSTEM SAHARA CHEST DRAIN ATS (WOUND CARE) ×5 IMPLANT
TAPE CLOTH SURG 4X10 WHT LF (GAUZE/BANDAGES/DRESSINGS) ×5 IMPLANT
TOWEL OR 17X24 6PK STRL BLUE (TOWEL DISPOSABLE) ×15 IMPLANT
TOWEL OR 17X26 10 PK STRL BLUE (TOWEL DISPOSABLE) ×10 IMPLANT
TRAY FOLEY IC TEMP SENS 16FR (CATHETERS) ×5 IMPLANT
TUBE FEEDING 8FR 16IN STR KANG (MISCELLANEOUS) ×5 IMPLANT
TUBING INSUFFLATION (TUBING) ×5 IMPLANT
UNDERPAD 30X30 INCONTINENT (UNDERPADS AND DIAPERS) ×5 IMPLANT
WATER STERILE IRR 1000ML POUR (IV SOLUTION) ×10 IMPLANT

## 2014-03-28 NOTE — Progress Notes (Signed)
  Echocardiogram Echocardiogram Transesophageal has been performed.  Danny [name] Danny [name] 03/28/2014, 10:43 AM

## 2014-03-28 NOTE — Transfer of Care (Signed)
Immediate Anesthesia Transfer of Care Note  Patient: Danny [name]  Procedure(s) Performed: Procedure(s): CORONARY ARTERY BYPASS GRAFTING (CABG) (N/A) TRANSESOPHAGEAL ECHOCARDIOGRAM (TEE) (N/A)  Patient Location: ICU  Anesthesia Type:General  Level of Consciousness: sedated  Airway & Oxygen Therapy: Patient remains intubated per anesthesia plan and Patient placed on Ventilator (see vital sign flow sheet for setting)  Post-op Assessment: Report given to RN and Post -op Vital signs reviewed and stable  Post vital signs: Reviewed and stable  Last Vitals:  Filed Vitals:   03/28/14 1310  BP: 114/66  Pulse: 88  Temp:   Resp: 21    Complications: No apparent anesthesia complications

## 2014-03-28 NOTE — Anesthesia Procedure Notes (Signed)
Procedure Name: Intubation Date/Time: 03/28/2014 8:07 AM Performed by: Edmonia Caprio Pre-anesthesia Checklist: Timeout performed, Emergency Drugs available, Suction available and Patient being monitored Patient Re-evaluated:Patient Re-evaluated prior to inductionOxygen Delivery Method: Circle system utilized Preoxygenation: Pre-oxygenation with 100% oxygen Intubation Type: IV induction Ventilation: Mask ventilation without difficulty Laryngoscope Size: Miller and 2 Grade View: Grade I Tube type: Oral Tube size: 8.0 mm Number of attempts: 1 Airway Equipment and Method: Stylet Placement Confirmation: ETT inserted through vocal cords under direct vision,  breath sounds checked- equal and bilateral and positive ETCO2 Secured at: 21 cm Tube secured with: Tape Dental Injury: Teeth and Oropharynx as per pre-operative assessment

## 2014-03-28 NOTE — Interval H&P Note (Signed)
History and Physical Interval Note:  03/28/2014 7:43 AM  Danny [name]  has presented today for surgery, with the diagnosis of CAD  The various methods of treatment have been discussed with the patient and family. After consideration of risks, benefits and other options for treatment, the patient has consented to  Procedure(s): CORONARY ARTERY BYPASS GRAFTING (CABG) (N/A) POSSIBLE RADIAL ARTERY HARVEST (Left) TRANSESOPHAGEAL ECHOCARDIOGRAM (TEE) (N/A) as a surgical intervention .  The patient's history has been reviewed, patient examined, no change in status, stable for surgery.  I have reviewed the patient's chart and labs.  Questions were answered to the patient's satisfaction.     Marbeth Smedley C

## 2014-03-28 NOTE — Anesthesia Preprocedure Evaluation (Addendum)
Anesthesia Evaluation  Patient identified by MRN, date of birth, ID band Patient awake    Reviewed: Allergy & Precautions, NPO status , Patient's Chart, lab work & pertinent test results  Airway Mallampati: II  TM Distance: >3 FB Neck ROM: Full    Dental no notable dental hx. (+) Teeth Intact, Dental Advisory Given   Pulmonary neg pulmonary ROS,  breath sounds clear to auscultation  Pulmonary exam normal       Cardiovascular + CAD and + Past MI Rhythm:Regular Rate:Normal     Neuro/Psych negative neurological ROS  negative psych ROS   GI/Hepatic negative GI ROS, Neg liver ROS,   Endo/Other  diabetes, Type 2, Oral Hypoglycemic Agents, Insulin Dependent  Renal/GU negative Renal ROS     Musculoskeletal negative musculoskeletal ROS (+)   Abdominal   Peds  Hematology negative hematology ROS (+)   Anesthesia Other Findings   Reproductive/Obstetrics negative OB ROS                            Anesthesia Physical Anesthesia Plan  ASA: IV  Anesthesia Plan: General   Post-op Pain Management:    Induction: Intravenous  Airway Management Planned: Oral ETT  Additional Equipment: Arterial line, CVP, PA Cath and TEE  Intra-op Plan:   Post-operative Plan: Post-operative intubation/ventilation  Informed Consent: I have reviewed the patients History and Physical, chart, labs and discussed the procedure including the risks, benefits and alternatives for the proposed anesthesia with the patient or authorized representative who has indicated his/her understanding and acceptance.   Dental advisory given  Plan Discussed with: CRNA  Anesthesia Plan Comments:         Anesthesia Quick Evaluation

## 2014-03-28 NOTE — Anesthesia Postprocedure Evaluation (Signed)
Anesthesia Post Note  Patient: Danny [name]  Procedure(s) Performed: Procedure(s) (LRB): CORONARY ARTERY BYPASS GRAFTING (CABG) (N/A) TRANSESOPHAGEAL ECHOCARDIOGRAM (TEE) (N/A)  Anesthesia type: General  Patient location: SICU  Post pain: Pain level controlled  Post assessment: Post-op Vital signs reviewed  Last Vitals: BP 113/65 mmHg  Pulse 79  Temp(Src) 37.2 C (Core (Comment))  Resp 22  Ht 5\' 8"  (1.727 m)  Wt 210 lb 1.6 oz (95.3 kg)  BMI 31.95 kg/m2  SpO2 98%  Post vital signs: Reviewed  Level of consciousness: sedated/intubated  Complications: No apparent anesthesia complications

## 2014-03-28 NOTE — Progress Notes (Signed)
NIF -28cmH20, and VC 1.2L (>10cc/kg).

## 2014-03-28 NOTE — Progress Notes (Signed)
EKG CRITICAL VALUE     12 lead EKG performed.  Critical value noted.  Zeb Comfort, RN notified.   Wandalee Ferdinand, [place] 03/28/2014 1:46 PM

## 2014-03-28 NOTE — Brief Op Note (Addendum)
03/23/2014 - 03/28/2014  11:26 AM  PATIENT:  Danny [name]  59 y.o. male  PRE-OPERATIVE DIAGNOSIS:   CAD  POST-OPERATIVE DIAGNOSIS:  CAD, severe pericarditis  PROCEDURE:  Procedure(s):  CORONARY ARTERY BYPASS GRAFTING  X 3 -LIMA to LAD -SVG to OM -SVG to DIAG 1  ENDOSCOPIC SAPHENOUS VEIN HARVEST RIGHT THIGH  TRANSESOPHAGEAL ECHOCARDIOGRAM (TEE) (N/A)  SURGEON:  Surgeon(s) and Role:    * Loreli Slot, MD - Primary  PHYSICIAN ASSISTANT: Erin Barrett PA-C  ANESTHESIA:   general  EBL:  Total I/O In: 1000 [I.V.:1000] Out: 740 [Urine:740]  BLOOD ADMINISTERED:  CELLSAVER  DRAINS: Left pleural Chest tube, Mediastinal Chest drain   LOCAL MEDICATIONS USED:  NONE  SPECIMEN:  Source of Specimen:  Epicardial Peel  DISPOSITION OF SPECIMEN:  PATHOLOGY  COUNTS:  YES  PLAN OF CARE: Admit to inpatient   PATIENT DISPOSITION:  ICU - intubated and hemodynamically stable.   Delay start of Pharmacological VTE agent (>24hrs) due to surgical blood loss or risk of bleeding: yes  XC= 75 min CPB= 120 min- off bypass with dopamine at 3  TEE= severe hypokinesis anterolateral wall Severe pericarditis  LAD Good target, OM and diagonal poor targets LIMA and SV good quality

## 2014-03-28 NOTE — Procedures (Signed)
Extubation Procedure Note  Patient Details:   Name: Danny [name] DOB: [birth year] MRN: [id number]   Airway Documentation:     Evaluation  O2 sats: stable throughout Complications: No apparent complications Patient did tolerate procedure well. Bilateral Breath Sounds: Rhonchi Suctioning: Airway Yes   Extubated pt to 5LPM [place]. Pt able to speak. Cuff leak present. No stridor noted. VS stable. No complications noted.   Shonna Chock 03/28/2014, 9:29 PM

## 2014-03-28 NOTE — Progress Notes (Signed)
CT surgery p.m. Rounds  Status post multivessel CABG Hemodynamic stable , low-dose dopamine and phenylephrine Sedated on ventilator Minimal chest tube drainage Postop labs reviewed and are satisfactory

## 2014-03-28 NOTE — H&P (View-Only) (Signed)
Reason for Consult:3 vessel CAD s/p MI Referring Physician: Dr. Cooper  Danny [name] is an 59 y.o. male.  HPI: chief complaint is chest pain.  Danny [name] is a 59 yo man with type II diabetes who presented this morning with a cc/o chest pain. He denies hypertension, hyperlipidemia, is a nonsmoker and has no family history of CAD. He has no prior history of heart trouble. He started having substernal chest pressure and burning pain about a week ago. The pain waxed and waned, but was progressively getting worse with each episode. Finally this morning it was so bad he presented to [facility] ED. He had ST depression concerning for inferior ischemia. While there his pain worsened and a repeat ECG showed lateral ST elevation. He was transferred to the [facility] cath lab as a code STEMI. Dr. Cooper did a cath which showed 3 vessel CAD. The diagonal was the likely culprit vessel. There was no indication for angioplasty. He is currently pain free. The LV gram did show anterolateral hypokinesis.   Past Medical History  Diagnosis Date  . Acute MI, lateral wall, initial episode of care 03/23/2014  . Type 2 diabetes mellitus 03/23/2014    No past surgical history on file.  Family History  Problem Relation Age of Onset  . Healthy Father     No history of CAD  . Healthy Mother     No history of CAD    Social History:  reports that he has never smoked. He does not have any smokeless tobacco history on file. His alcohol and drug histories are not on file.  Allergies: No Known Allergies  Medications:  Scheduled: . [START ON 03/24/2014] aspirin EC  81 mg Oral Daily  . atorvastatin  80 mg Oral q1800  . insulin aspart  0-15 Units Subcutaneous TID WC  . insulin detemir  10 Units Subcutaneous QHS  . metoprolol tartrate  12.5 mg Oral BID  . sodium chloride  3 mL Intravenous Q12H    Results for orders placed or performed during the hospital encounter of 03/23/14 (from the past 48 hour(s))  Brain  natriuretic peptide     Status: Abnormal   Collection Time: 03/23/14  9:00 AM  Result Value Ref Range   B Natriuretic Peptide 556.0 (H) 0.0 - 100.0 pg/mL  Troponin I-(serum)     Status: Abnormal   Collection Time: 03/23/14  9:03 AM  Result Value Ref Range   Troponin I 6.78 (HH) <0.031 ng/mL    Comment:        POSSIBLE MYOCARDIAL ISCHEMIA. SERIAL TESTING RECOMMENDED. REPEATED TO VERIFY CRITICAL RESULT CALLED TO, READ BACK BY AND VERIFIED WITH: Danny HERBERT RN AT 0959 03/23/14 BY WOOLLENK   CBC     Status: Abnormal   Collection Time: 03/23/14  9:03 AM  Result Value Ref Range   WBC 11.7 (H) 4.0 - 10.5 K/uL   RBC 4.54 4.22 - 5.81 MIL/uL   Hemoglobin 13.6 13.0 - 17.0 g/dL   HCT 38.0 (L) 39.0 - 52.0 %   MCV 83.7 78.0 - 100.0 fL   MCH 30.0 26.0 - 34.0 pg   MCHC 35.8 30.0 - 36.0 g/dL   RDW 12.8 11.5 - 15.5 %   Platelets 207 150 - 400 K/uL  Differential     Status: Abnormal   Collection Time: 03/23/14  9:03 AM  Result Value Ref Range   Neutrophils Relative % 82 (H) 43 - 77 %   Neutro Abs 9.6 (H) 1.7 - 7.7   K/uL   Lymphocytes Relative 10 (L) 12 - 46 %   Lymphs Abs 1.2 0.7 - 4.0 K/uL   Monocytes Relative 8 3 - 12 %   Monocytes Absolute 0.9 0.1 - 1.0 K/uL   Eosinophils Relative 0 0 - 5 %   Eosinophils Absolute 0.0 0.0 - 0.7 K/uL   Basophils Relative 0 0 - 1 %   Basophils Absolute 0.0 0.0 - 0.1 K/uL  Protime-INR     Status: None   Collection Time: 03/23/14  9:03 AM  Result Value Ref Range   Prothrombin Time 15.2 11.6 - 15.2 seconds   INR 1.19 0.00 - 1.49  APTT     Status: Abnormal   Collection Time: 03/23/14  9:03 AM  Result Value Ref Range   aPTT 101 (H) 24 - 37 seconds    Comment:        IF BASELINE aPTT IS ELEVATED, SUGGEST PATIENT RISK ASSESSMENT BE USED TO DETERMINE APPROPRIATE ANTICOAGULANT THERAPY.   Basic metabolic panel     Status: Abnormal   Collection Time: 03/23/14  9:03 AM  Result Value Ref Range   Sodium 134 (L) 135 - 145 mmol/L   Potassium 3.4 (L) 3.5 -  5.1 mmol/L   Chloride 103 96 - 112 mmol/L   CO2 18 (L) 19 - 32 mmol/L   Glucose, Bld 237 (H) 70 - 99 mg/dL   BUN 12 6 - 23 mg/dL   Creatinine, Ser 0.98 0.50 - 1.35 mg/dL   Calcium 7.7 (L) 8.4 - 10.5 mg/dL   GFR calc non Af Amer 89 (L) >90 mL/min   GFR calc Af Amer >90 >90 mL/min    Comment: (NOTE) The eGFR has been calculated using the CKD EPI equation. This calculation has not been validated in all clinical situations. eGFR's persistently <90 mL/min signify possible Chronic Kidney Disease.    Anion gap 13 5 - 15  TSH     Status: None   Collection Time: 03/23/14  9:03 AM  Result Value Ref Range   TSH 0.591 0.350 - 4.500 uIU/mL  MRSA PCR Screening     Status: None   Collection Time: 03/23/14 10:43 AM  Result Value Ref Range   MRSA by PCR NEGATIVE NEGATIVE    Comment:        The GeneXpert MRSA Assay (FDA approved for NASAL specimens only), is one component of a comprehensive MRSA colonization surveillance program. It is not intended to diagnose MRSA infection nor to guide or monitor treatment for MRSA infections.   Glucose, capillary     Status: Abnormal   Collection Time: 03/23/14 11:52 AM  Result Value Ref Range   Glucose-Capillary 228 (H) 70 - 99 mg/dL   Comment 1 Capillary Sample     No results found.  Review of Systems  Constitutional: Positive for chills. Negative for fever.  Respiratory: Positive for cough.   Cardiovascular: Positive for chest pain.  Gastrointestinal: Positive for nausea.  All other systems reviewed and are negative.  Blood pressure 161/72, pulse 91, temperature 99.3 F (37.4 C), temperature source Oral, resp. rate 22, height 5' 8" (1.727 m), weight 201 lb 1 oz (91.2 kg), SpO2 96 %. Physical Exam  Vitals reviewed. Constitutional: He is oriented to person, place, and time. He appears well-developed and well-nourished. No distress.  HENT:  Head: Normocephalic and atraumatic.  Eyes: EOM are normal. Pupils are equal, round, and reactive to  light.  Neck: Neck supple. No thyromegaly present.  No carotid bruits    Cardiovascular: Normal rate, regular rhythm, normal heart sounds and intact distal pulses.   No murmur heard. Normal Allen's test left radial  Respiratory: Effort normal and breath sounds normal. He has no wheezes.  GI: Soft. Bowel sounds are normal. There is no tenderness.  Musculoskeletal: He exhibits no edema.  Lymphadenopathy:    He has no cervical adenopathy.  Neurological: He is alert and oriented to person, place, and time.  Skin: Skin is warm and dry.    CARDIAC CATHETERIZATION Procedural Findings:  Hemodynamics:  AO 136/73  LV 137/26  Coronary angiography:  Coronary dominance: right  Left mainstem: The left mainstem is patent. The vessel is diffusely calcified with minor irregularity but no significant stenoses are noted.  Left anterior descending (LAD): The LAD is diffusely diseased. The proximal vessel at the ostium has 50% hypodense stenosis. There is an eccentric plaque in the mid vessel just after the origin of the first diagonal with 75-80% stenosis. Further down in the mid and distal LAD there is diffuse coronary obstruction without high-grade stenosis. The first diagonal trifurcates into 3 small subbranch is. The vessel is diffusely diseased. The ostium has 90% stenosis. In some angles this appears hypodense and I suspect this is the patient's culprit vessel.  Left circumflex (LCx): The entire circumflex distribution is diffusely diseased. The proximal circumflex has 90% stenosis. The first obtuse marginal is very small in caliber and it is diffusely diseased with 80% stenosis. The second obtuse marginal is medium in caliber and also has diffuse disease with 80% stenosis.  Right coronary artery (RCA): Large, dominant vessel. The proximal, mid, and distal RCA have minor irregularity but no significant stenosis. The PDA branch is diffusely diseased with 80% stenosis in the mid and distal vessel. The PLA  branches are patent.  Left ventriculography: The anterolateral wall is akinetic. The LV apex contracts normally. The basal anterior wall contracts normally. The inferior wall contracts normally. The estimated LVEF is 40-45%. There is no significant mitral regurgitation appreciated.  Estimated Blood Loss: minimal  Final Conclusions:  1. Severe multivessel coronary artery disease with severe stenosis of the proximal circumflex, mid LAD, proximal first diagonal branch, and right PDA  2. Acute lateral wall infarction with anterolateral akinesis, suspected diagonal territory infarct with late clinical presentation  3. Moderate segmental LV systolic dysfunction with estimated LVEF 40-45%   I personally reviewed the cath films in the cath lab with Dr. Cooper and was involved in the decision making process.  Assessment/Plan: 59 yo man with diabetes who presents after an out of hospital MI with unstable postinfarct angina. He has 3 vessel CAD and impaired LV function with an EF of 40-45%.  CABG is indicated for survival benefit and relief of symptoms.  I discussed the general nature of the procedure, the need for general anesthesia, and the incisions to be used. We discussed possibly using the left radial artery. I discussed the expected hospital stay, overall recovery and short and long term outcomes. I reviewed the indications, risks, benefits and alternatives. He understands the risks include but are not limited to death, stroke, MI, DVT/PE, bleeding, possible need for transfusion, infections, arrhythmias, and other organ system dysfunction including respiratory, renal, or GI complications. He accepts the risks and agrees to proceed.  Will plan to proceed next week. Currently appears OR availability limited to later in week.   Nataleah Scioneaux C 03/23/2014, 3:46 PM      

## 2014-03-29 ENCOUNTER — Inpatient Hospital Stay (HOSPITAL_COMMUNITY): Payer: No Typology Code available for payment source

## 2014-03-29 ENCOUNTER — Encounter (HOSPITAL_COMMUNITY): Payer: Self-pay | Admitting: Thoracic Surgery (Cardiothoracic Vascular Surgery)

## 2014-03-29 LAB — CBC
HCT: 32.5 % — ABNORMAL LOW (ref 39.0–52.0)
HCT: 31.8 % — ABNORMAL LOW (ref 39.0–52.0)
Hemoglobin: 11.3 g/dL — ABNORMAL LOW (ref 13.0–17.0)
Hemoglobin: 10.9 g/dL — ABNORMAL LOW (ref 13.0–17.0)
MCH: 29.8 pg (ref 26.0–34.0)
MCH: 28.9 pg (ref 26.0–34.0)
MCHC: 34.8 g/dL (ref 30.0–36.0)
MCHC: 34.3 g/dL (ref 30.0–36.0)
MCV: 85.8 fL (ref 78.0–100.0)
MCV: 84.4 fL (ref 78.0–100.0)
PLATELETS: 222 10*3/uL (ref 150–400)
Platelets: 171 10*3/uL (ref 150–400)
RBC: 3.79 MIL/uL — ABNORMAL LOW (ref 4.22–5.81)
RBC: 3.77 MIL/uL — AB (ref 4.22–5.81)
RDW: 12.9 % (ref 11.5–15.5)
RDW: 12.9 % (ref 11.5–15.5)
WBC: 10.3 10*3/uL (ref 4.0–10.5)
WBC: 12.4 10*3/uL — ABNORMAL HIGH (ref 4.0–10.5)

## 2014-03-29 LAB — BASIC METABOLIC PANEL
Anion gap: 7 (ref 5–15)
BUN: 18 mg/dL (ref 6–23)
CO2: 21 mmol/L (ref 19–32)
CREATININE: 0.95 mg/dL (ref 0.50–1.35)
Calcium: 7 mg/dL — ABNORMAL LOW (ref 8.4–10.5)
Chloride: 103 mmol/L (ref 96–112)
GFR calc Af Amer: >90 mL/min (ref >90)
GFR calc non Af Amer: >90 mL/min (ref >90)
Glucose, Bld: 136 mg/dL — ABNORMAL HIGH (ref 70–99)
POTASSIUM: 4.6 mmol/L (ref 3.5–5.1)
Sodium: 131 mmol/L — ABNORMAL LOW (ref 135–145)

## 2014-03-29 LAB — CREATININE, SERUM
CREATININE: 1.05 mg/dL (ref 0.50–1.35)
GFR calc Af Amer: 89 mL/min — ABNORMAL LOW (ref >90)
GFR calc non Af Amer: 76 mL/min — ABNORMAL LOW (ref >90)

## 2014-03-29 LAB — GLUCOSE, CAPILLARY
GLUCOSE-CAPILLARY: 121 mg/dL — AB (ref 70–99)
GLUCOSE-CAPILLARY: 124 mg/dL — AB (ref 70–99)
GLUCOSE-CAPILLARY: 129 mg/dL — AB (ref 70–99)
GLUCOSE-CAPILLARY: 130 mg/dL — AB (ref 70–99)
GLUCOSE-CAPILLARY: 132 mg/dL — AB (ref 70–99)
GLUCOSE-CAPILLARY: 135 mg/dL — AB (ref 70–99)
GLUCOSE-CAPILLARY: 136 mg/dL — AB (ref 70–99)
GLUCOSE-CAPILLARY: 153 mg/dL — AB (ref 70–99)
Glucose-Capillary: 149 mg/dL — ABNORMAL HIGH (ref 70–99)
Glucose-Capillary: 97 mg/dL (ref 70–99)
Glucose-Capillary: 128 mg/dL — ABNORMAL HIGH (ref 70–99)
Glucose-Capillary: 143 mg/dL — ABNORMAL HIGH (ref 70–99)
Glucose-Capillary: 135 mg/dL — ABNORMAL HIGH (ref 70–99)
Glucose-Capillary: 170 mg/dL — ABNORMAL HIGH (ref 70–99)
Glucose-Capillary: 147 mg/dL — ABNORMAL HIGH (ref 70–99)
Glucose-Capillary: 139 mg/dL — ABNORMAL HIGH (ref 70–99)
Glucose-Capillary: 135 mg/dL — ABNORMAL HIGH (ref 70–99)
Glucose-Capillary: 134 mg/dL — ABNORMAL HIGH (ref 70–99)
Glucose-Capillary: 117 mg/dL — ABNORMAL HIGH (ref 70–99)

## 2014-03-29 LAB — POCT I-STAT, CHEM 8
BUN: 24 mg/dL — AB (ref 6–23)
CALCIUM ION: 1.09 mmol/L — AB (ref 1.12–1.23)
CHLORIDE: 98 mmol/L (ref 96–112)
CREATININE: 1.1 mg/dL (ref 0.50–1.35)
GLUCOSE: 146 mg/dL — AB (ref 70–99)
HCT: 34 % — ABNORMAL LOW (ref 39.0–52.0)
Hemoglobin: 11.6 g/dL — ABNORMAL LOW (ref 13.0–17.0)
Potassium: 4.8 mmol/L (ref 3.5–5.1)
Sodium: 128 mmol/L — ABNORMAL LOW (ref 135–145)
TCO2: 17 mmol/L (ref 0–100)

## 2014-03-29 LAB — MAGNESIUM
Magnesium: 3 mg/dL — ABNORMAL HIGH (ref 1.5–2.5)
Magnesium: 2.9 mg/dL — ABNORMAL HIGH (ref 1.5–2.5)

## 2014-03-29 MED ORDER — CETYLPYRIDINIUM CHLORIDE 0.05 % MT LIQD
7.0000 mL | Freq: Two times a day (BID) | OROMUCOSAL | Status: DC
  Administered 2014-03-29 – 2014-04-01 (×5): 7 mL via OROMUCOSAL

## 2014-03-29 MED ORDER — FUROSEMIDE 10 MG/ML IJ SOLN
40.0000 mg | Freq: Once | INTRAMUSCULAR | Status: AC
  Administered 2014-03-29: 40 mg via INTRAVENOUS

## 2014-03-29 MED ORDER — FUROSEMIDE 10 MG/ML IJ SOLN
40.0000 mg | Freq: Once | INTRAMUSCULAR | Status: AC
  Administered 2014-03-29: 40 mg via INTRAVENOUS
  Filled 2014-03-29: qty 4

## 2014-03-29 MED FILL — Sodium Chloride IV Soln 0.9%: INTRAVENOUS | Qty: 3000 | Status: AC

## 2014-03-29 MED FILL — Electrolyte-R (PH 7.4) Solution: INTRAVENOUS | Qty: 1000 | Status: AC

## 2014-03-29 MED FILL — Mannitol IV Soln 20%: INTRAVENOUS | Qty: 500 | Status: AC

## 2014-03-29 MED FILL — Heparin Sodium (Porcine) Inj 1000 Unit/ML: INTRAMUSCULAR | Qty: 30 | Status: AC

## 2014-03-29 MED FILL — Sodium Bicarbonate IV Soln 8.4%: INTRAVENOUS | Qty: 50 | Status: AC

## 2014-03-29 MED FILL — Potassium Chloride Inj 2 mEq/ML: INTRAVENOUS | Qty: 40 | Status: AC

## 2014-03-29 MED FILL — Magnesium Sulfate Inj 50%: INTRAMUSCULAR | Qty: 10 | Status: AC

## 2014-03-29 MED FILL — Lidocaine HCl IV Inj 20 MG/ML: INTRAVENOUS | Qty: 5 | Status: AC

## 2014-03-29 NOTE — Progress Notes (Signed)
Patient ID: Danny [name], male   DOB: 09-11-1955, 59 y.o.   MRN: [id number]  SICU Evening Rounds  Hemodynamically stable  Dopamine off  Remains on insulin drip as planned by Dr. Dorris Fetch. Transition tomorrow.  Urine output ok  CBC    Component Value Date/Time   WBC 12.4* 03/29/2014 1700   RBC 3.77* 03/29/2014 1700   HGB 11.6* 03/29/2014 1719   HCT 34.0* 03/29/2014 1719   PLT 222 03/29/2014 1700   MCV 84.4 03/29/2014 1700   MCH 28.9 03/29/2014 1700   MCHC 34.3 03/29/2014 1700   RDW 12.9 03/29/2014 1700   LYMPHSABS 1.2 03/23/2014 0903   MONOABS 0.9 03/23/2014 0903   EOSABS 0.0 03/23/2014 0903   BASOSABS 0.0 03/23/2014 0903    BMET    Component Value Date/Time   NA 128* 03/29/2014 1719   K 4.8 03/29/2014 1719   CL 98 03/29/2014 1719   CO2 21 03/29/2014 0400   GLUCOSE 146* 03/29/2014 1719   BUN 24* 03/29/2014 1719   CREATININE 1.10 03/29/2014 1719   CALCIUM 7.0* 03/29/2014 0400   GFRNONAA 76* 03/29/2014 1700   GFRAA 89* 03/29/2014 1700

## 2014-03-29 NOTE — Op Note (Signed)
NAMENUH, [place] NO.:  [id number]  MEDICAL RECORD NO.:  [id number]  LOCATION:  2S04C                        FACILITY:  [facility]  PHYSICIAN:  Salvatore Decent. Dorris Fetch, M.D.DATE OF BIRTH:  04/04/1955  DATE OF PROCEDURE:  03/28/2014 DATE OF DISCHARGE:                              OPERATIVE REPORT   PREOPERATIVE DIAGNOSES:  Three-vessel coronary artery disease post myocardial infarction, pericarditis.  POSTOPERATIVE DIAGNOSES:  Three-vessel coronary artery disease post myocardial infarction, severe hemorrhagic pericarditis.  PROCEDURE:  Median sternotomy, extracorporeal circulation Coronary artery bypass grafting x3   Left internal mammary artery to left anterior descending  Saphenous vein graft to first diagonal  Saphenous vein graft to obtuse marginal Endoscopic vein harvest, right thigh.  SURGEON:  Salvatore Decent. Dorris Fetch, M.D.  ASSISTANTLowella Dandy, PA.  ANESTHESIA:  General.  FINDINGS:  Transesophageal echocardiography revealed anterolateral severe hypokinesis to akinesis.  No significant valvular pathology. Left internal mammary and saphenous vein good quality. Severe hemorrhagic pericarditis. Diagonal and OM difficult to localize. Both 1 mm poor-quality targets. LAD 1.5 mm good quality target.  OPERATIVE NOTE:  Mr. Freel is a 59 year old gentleman with diabetes who presented on March 23, 2014, after an out of hospital myocardial infarction.  He had ST elevation and was taken to the cath lab as a code STEMI, and was found to have three-vessel disease.  There was no indication for emergent intervention. Ejection fraction was approximately 40% with anterolateral hypokinesis.  He was referred for coronary artery bypass grafting.  The indications, risks, benefits, and alternatives were discussed in detail with the patient.  He understood and accepted the risks and agreed to proceed.  DESCRIPTION OF PROCEDURE:  Mr. Kallal was brought to the  preoperative holding area on March 28, 2014.  There anesthesia placed a Swan-Ganz catheter and an arterial blood pressure monitoring line.  He was taken to the operating room, anesthetized, and intubated.  Intravenous antibiotics were administered.  A Foley catheter was placed. Transesophageal echocardiography was performed.  It showed severe anterolateral hypokinesis to akinesis.  There was no significant valvular pathology.  The chest, abdomen, and legs were prepped and draped in the usual sterile fashion.  An incision was made in the medial aspect of the right leg at the level of the knee.  The greater saphenous vein was harvested from the right thigh endoscopically.  It was a good quality conduit.  Simultaneously, a median sternotomy was performed and the left internal mammary artery was harvested using standard technique. Approximately 500 mL of pleural effusion was evacuated from the left chest during the mammary takedown.  2000 units of heparin was administered during the vessel harvest, and the remainder of full heparin dose given prior to opening the pericardium.  Sternal retractor was placed.  The right pleural space was opened and a 1 L serous effusion was evacuated from the right chest.  The pericardium was opened.  There was a bloody pericardial effusion.  There was extensive fibrinous debris on the epicardial and pericardial surfaces as well as the aorta and pulmonary artery.  After confirming adequate anticoagulation with ACT measurement, the aorta was cannulated via concentric 2-0 Ethibond pledgeted pursestring sutures.  A dual-stage venous cannula was  placed via a pursestring suture in the right atrial appendage.  Cardiopulmonary bypass was instituted and the patient was cooled to 32 degrees Celsius.  The coronary arteries were inspected and anastomotic sites chosen. When lifting the heart to inspect the lateral wall.  The OM's were not visible.  The fibrinous exudate  on the heart did peel off and this was peeled off the remainder of the heart as well.  The diagonal also could not be located until after heart was clamped and arrested.  The conduits were inspected.  A foam pad was placed in the pericardium to insulate the heart and protect left phrenic nerve.  A temperature probe was placed in myocardial septum and a cardioplegia cannula was placed in the ascending aorta.  The aorta was crossclamped.  The left ventricle was emptied via the aortic vent.  Cardiac arrest then was achieved with a combination of cold antegrade blood cardioplegia and topical iced saline.  1 L cardioplegia was administered.  There was a rapid diastolic arrest.  There was septal cooling to 10 degrees Celsius.  The heart was elevated exposing the lateral wall.  An obtuse marginal vessel was dissected out.  This was likely OM2 on the catheterization films but was not definite as the other vessels could not be identified. An arteriotomy was made and the saphenous vein was sewn end-to-side with a running 7-0 Prolene suture.  The OM was a 1 mm poor quality target. The vein was good quality.  All anastomoses were probed proximally and distally at their completion to ensure patency.  Cardioplegia was administered down the vein graft.  There was good flow given the vessel size and good hemostasis.  Next, a reversed saphenous vein graft was placed end-to-side to the first diagonal branch of the LAD.  This was a trifurcating vessel, heavily diseased proximally. It also was difficult to identify and dissect out. It also was a 1 mm poor quality vessel. An end-to-side anastomosis was performed with the vein with a running 7-0 Prolene suture. The probe passed easily proximally and distally.  Cardioplegia was administered.  There was acceptable flow and good hemostasis.  Additional cardioplegia was administered down the aortic root.  The pericardium was incised.  The distal end of the  left mammary artery was beveled and was anastomosed end-to-side to the distal LAD.  The mammary was a 2 mm good quality conduit.  The LAD was a 1.5 mm good quality target.  An end-to-side anastomosis was performed with a running 8-0 Prolene suture.  At completion of the anastomosis, the bulldog clamp was briefly removed to inspect for hemostasis.  Rapid septal rewarming was noted.  The bulldog clamp was replaced and the mammary pedicle was tacked to the epicardial surface of the heart with 6-0 Prolene sutures.  Additional cardioplegia was administered.  The vein grafts were cut to length.  The proximal vein graft anastomoses then were performed to 4.5 mm punch aortotomies with running 6-0 Prolene sutures.  At the completion of final proximal anastomosis, the patient was placed in Trendelenburg position.  Lidocaine was administered.  The aortic root was de-aired, the bulldog clamp was removed from the left mammary artery and rapid septal rewarming was again noted.  The aortic crossclamp was removed.  Total crossclamp time was 75 minutes.  The patient required a single defibrillation with 10 joules and then was in sinus rhythm thereafter.  While rewarming was completed, all proximal and distal anastomoses were inspected for hemostasis.  Epicardial pacing wires placed  on the right ventricle and right atrium.  When the patient had rewarmed to a core temperature of 37 degrees Celsius, he was started on a dopamine infusion at 3 mcg/kg per minute.  He then weaned from bypass on the first attempt without difficulty.  The total bypass time was 120 minutes.  The initial cardiac index was greater than 2 L/minute/meter squared.  He remained hemodynamically stable throughout the postbypass period.  A test dose of protamine was administered and was well tolerated.  The atrial and aortic cannulae were removed.  The remainder of the protamine was administered without incident.  The chest irrigated  with warm saline.  Hemostasis was achieved.  No attempt was made to close the pericardium.  A left pleural and two mediastinal chest tubes were placed via a separate subcostal incisions.  The sternum was closed with interrupted heavy gauge stainless steel wires.  The pectoralis fascia, subcutaneous tissue, and skin were closed in standard fashion.  All sponge, needle, and instrument counts were correct at the end of the procedure.  The patient was taken from the operating room to the surgical intensive care unit in good condition.     Salvatore Decent Dorris Fetch, M.D.     SCH/MEDQ  D:  03/28/2014  T:  03/29/2014  Job:  [id number]

## 2014-03-29 NOTE — Progress Notes (Signed)
EKG CRITICAL VALUE     12 lead EKG performed.  Critical value noted.  Anthoney Harada  RN notified.   Wandalee Ferdinand, CCT 03/29/2014 7:25 AM

## 2014-03-29 NOTE — Progress Notes (Signed)
1 Day Post-Op Procedure(s) (LRB): CORONARY ARTERY BYPASS GRAFTING (CABG) (N/A) TRANSESOPHAGEAL ECHOCARDIOGRAM (TEE) (N/A) Subjective: "Hurts a little" Denies nausea  Objective: Vital signs in last 24 hours: Temp:  [97 F (36.1 C)-101.3 F (38.5 C)] 99.1 F (37.3 C) (02/12 0700) Pulse Rate:  [79-108] 103 (02/12 0700) Cardiac Rhythm:  [-] Sinus tachycardia (02/12 0700) Resp:  [8-48] 22 (02/12 0700) BP: (99-142)/(62-118) 139/79 mmHg (02/12 0700) SpO2:  [89 %-100 %] 93 % (02/12 0700) Arterial Line BP: (95-150)/(44-77) 146/73 mmHg (02/12 0700) FiO2 (%):  [40 %-100 %] 40 % (02/11 2030) Weight:  [189 lb 6 oz (85.9 kg)] 189 lb 6 oz (85.9 kg) (02/12 0400)  Hemodynamic parameters for last 24 hours: PAP: (25-58)/(13-29) 39/22 mmHg CO:  [3.6 L/min-6.2 L/min] 5.8 L/min CI:  [1.8 L/min/m2-3 L/min/m2] 2.8 L/min/m2  Intake/Output from previous day: 02/11 0701 - 02/12 0700 In: 4982.1 [I.V.:2622.1; Blood:1150; NG/GT:60; IV Piggyback:1150] Out: 4540 [Urine:2245; Emesis/NG output:200; Blood:1575; Chest Tube:520] Intake/Output this shift:    General appearance: alert and no distress Neurologic: nonfocal Heart: regular rate and rhythm and + rub Lungs: diminished breath sounds bibasilar Abdomen: normal findings: soft, non-tender  Lab Results:  Recent Labs  03/28/14 2100 03/28/14 2236 03/29/14 0400  WBC 10.7*  --  10.3  HGB 11.3* 12.2* 11.3*  HCT 32.2* 36.0* 32.5*  PLT 216  --  171   BMET:  Recent Labs  03/28/14 0334  03/28/14 2236 03/29/14 0400  NA 127*  < > 131* 131*  K 4.0  < > 4.7 4.6  CL 96  < > 100 103  CO2 26  --   --  21  GLUCOSE 129*  < > 116* 136*  BUN 16  < > 17 18  CREATININE 0.99  < > 0.80 0.95  CALCIUM 8.0*  --   --  7.0*  < > = values in this interval not displayed.  PT/INR:  Recent Labs  03/28/14 1300  LABPROT 17.5*  INR 1.42   ABG    Component Value Date/Time   PHART 7.309* 03/28/2014 2236   HCO3 19.6* 03/28/2014 2236   TCO2 21 03/28/2014 2236    TCO2 18 03/28/2014 2236   ACIDBASEDEF 6.0* 03/28/2014 2236   O2SAT 96.0 03/28/2014 2236   CBG (last 3)   Recent Labs  03/28/14 1956 03/28/14 2129 03/28/14 2235  GLUCAP 101* 116* 107*    Assessment/Plan: S/P Procedure(s) (LRB): CORONARY ARTERY BYPASS GRAFTING (CABG) (N/A) TRANSESOPHAGEAL ECHOCARDIOGRAM (TEE) (N/A) See progression orders   CV- Good hemodynamics- dc swan  ECG shows ST elevation- ? Lateral STEMI- suspect combination of old infarct and pericarditis  ASA, statin, beta blocker  Had severe pericarditis with hemorrhagic component- no lovenox for now  RESP_ IS for basilar atelectasis  Albuterol PRN  RENAL- lytes and creatinine OK  Diurese  ENDO- CBG controlled with insulin drip,   Advance diet  OOB, ambulate  DVT prophylaxis- continue SCD, no lovenox for now  Will continue with insulin drip for now given difficulty managing    LOS: 6 days    HENDRICKSON,STEVEN C 03/29/2014

## 2014-03-30 ENCOUNTER — Inpatient Hospital Stay (HOSPITAL_COMMUNITY): Payer: No Typology Code available for payment source

## 2014-03-30 LAB — BASIC METABOLIC PANEL
Anion gap: 8 (ref 5–15)
BUN: 27 mg/dL — ABNORMAL HIGH (ref 6–23)
CO2: 23 mmol/L (ref 19–32)
CREATININE: 1.17 mg/dL (ref 0.50–1.35)
Calcium: 7.1 mg/dL — ABNORMAL LOW (ref 8.4–10.5)
Chloride: 97 mmol/L (ref 96–112)
GFR calc Af Amer: 78 mL/min — ABNORMAL LOW (ref >90)
GFR, EST NON AFRICAN AMERICAN: 67 mL/min — AB (ref >90)
Glucose, Bld: 156 mg/dL — ABNORMAL HIGH (ref 70–99)
Potassium: 4 mmol/L (ref 3.5–5.1)
Sodium: 128 mmol/L — ABNORMAL LOW (ref 135–145)

## 2014-03-30 LAB — GLUCOSE, CAPILLARY
GLUCOSE-CAPILLARY: 106 mg/dL — AB (ref 70–99)
GLUCOSE-CAPILLARY: 95 mg/dL (ref 70–99)
GLUCOSE-CAPILLARY: 113 mg/dL — AB (ref 70–99)
GLUCOSE-CAPILLARY: 203 mg/dL — AB (ref 70–99)
GLUCOSE-CAPILLARY: 214 mg/dL — AB (ref 70–99)
GLUCOSE-CAPILLARY: 198 mg/dL — AB (ref 70–99)
GLUCOSE-CAPILLARY: 142 mg/dL — AB (ref 70–99)
Glucose-Capillary: 101 mg/dL — ABNORMAL HIGH (ref 70–99)
Glucose-Capillary: 118 mg/dL — ABNORMAL HIGH (ref 70–99)
Glucose-Capillary: 119 mg/dL — ABNORMAL HIGH (ref 70–99)
Glucose-Capillary: 145 mg/dL — ABNORMAL HIGH (ref 70–99)
Glucose-Capillary: 167 mg/dL — ABNORMAL HIGH (ref 70–99)
Glucose-Capillary: 173 mg/dL — ABNORMAL HIGH (ref 70–99)
Glucose-Capillary: 220 mg/dL — ABNORMAL HIGH (ref 70–99)
Glucose-Capillary: 85 mg/dL (ref 70–99)
Glucose-Capillary: 97 mg/dL (ref 70–99)

## 2014-03-30 LAB — CBC
HCT: 29.1 % — ABNORMAL LOW (ref 39.0–52.0)
Hemoglobin: 9.9 g/dL — ABNORMAL LOW (ref 13.0–17.0)
MCH: 29.4 pg (ref 26.0–34.0)
MCHC: 34 g/dL (ref 30.0–36.0)
MCV: 86.4 fL (ref 78.0–100.0)
PLATELETS: 210 10*3/uL (ref 150–400)
RBC: 3.37 MIL/uL — ABNORMAL LOW (ref 4.22–5.81)
RDW: 13.1 % (ref 11.5–15.5)
WBC: 9.1 10*3/uL (ref 4.0–10.5)

## 2014-03-30 MED ORDER — SODIUM CHLORIDE 0.9 % IJ SOLN
3.0000 mL | Freq: Two times a day (BID) | INTRAMUSCULAR | Status: DC
  Administered 2014-03-30 – 2014-04-01 (×4): 3 mL via INTRAVENOUS

## 2014-03-30 MED ORDER — ONDANSETRON HCL 4 MG/2ML IJ SOLN: 4.0000 mg | Freq: Four times a day (QID) | INTRAMUSCULAR | Status: DC | PRN

## 2014-03-30 MED ORDER — METOPROLOL TARTRATE 12.5 MG HALF TABLET
12.5000 mg | ORAL_TABLET | Freq: Two times a day (BID) | ORAL | Status: DC
  Administered 2014-03-30: 12.5 mg via ORAL
  Filled 2014-03-30 (×3): qty 1

## 2014-03-30 MED ORDER — INSULIN ASPART 100 UNIT/ML ~~LOC~~ SOLN
0.0000 [IU] | SUBCUTANEOUS | Status: DC
  Administered 2014-03-30: 2 [IU] via SUBCUTANEOUS
  Administered 2014-03-30: 8 [IU] via SUBCUTANEOUS
  Administered 2014-03-31: 2 [IU] via SUBCUTANEOUS
  Administered 2014-03-31: 4 [IU] via SUBCUTANEOUS
  Administered 2014-03-31: 2 [IU] via SUBCUTANEOUS
  Administered 2014-04-01: 8 [IU] via SUBCUTANEOUS

## 2014-03-30 MED ORDER — ASPIRIN EC 325 MG PO TBEC
325.0000 mg | DELAYED_RELEASE_TABLET | Freq: Every day | ORAL | Status: DC
  Administered 2014-03-31 – 2014-04-01 (×2): 325 mg via ORAL
  Filled 2014-03-30 (×2): qty 1

## 2014-03-30 MED ORDER — DOCUSATE SODIUM 100 MG PO CAPS
200.0000 mg | ORAL_CAPSULE | Freq: Every day | ORAL | Status: DC
  Administered 2014-03-31 – 2014-04-01 (×2): 200 mg via ORAL
  Filled 2014-03-30 (×2): qty 2

## 2014-03-30 MED ORDER — BISACODYL 5 MG PO TBEC: 10.0000 mg | DELAYED_RELEASE_TABLET | Freq: Every day | ORAL | Status: DC | PRN

## 2014-03-30 MED ORDER — OXYCODONE HCL 5 MG PO TABS
5.0000 mg | ORAL_TABLET | ORAL | Status: DC | PRN
Start: 2014-03-30 — End: 2014-04-01

## 2014-03-30 MED ORDER — ACETAMINOPHEN 325 MG PO TABS: 650.0000 mg | ORAL_TABLET | Freq: Four times a day (QID) | ORAL | Status: DC | PRN

## 2014-03-30 MED ORDER — MOVING RIGHT ALONG BOOK
Freq: Once | Status: AC
  Administered 2014-03-30: 16:00:00
  Filled 2014-03-30: qty 1

## 2014-03-30 MED ORDER — PANTOPRAZOLE SODIUM 40 MG PO TBEC
40.0000 mg | DELAYED_RELEASE_TABLET | Freq: Every day | ORAL | Status: DC
  Administered 2014-03-31 – 2014-04-01 (×2): 40 mg via ORAL
  Filled 2014-03-30 (×2): qty 1

## 2014-03-30 MED ORDER — INSULIN DETEMIR 100 UNIT/ML ~~LOC~~ SOLN
30.0000 [IU] | Freq: Two times a day (BID) | SUBCUTANEOUS | Status: DC
  Administered 2014-03-30 – 2014-04-01 (×4): 30 [IU] via SUBCUTANEOUS
  Filled 2014-03-30 (×6): qty 0.3

## 2014-03-30 MED ORDER — TRAMADOL HCL 50 MG PO TABS: 50.0000 mg | ORAL_TABLET | ORAL | Status: DC | PRN

## 2014-03-30 MED ORDER — SODIUM CHLORIDE 0.9 % IV SOLN: 250.0000 mL | INTRAVENOUS | Status: DC | PRN

## 2014-03-30 MED ORDER — BISACODYL 10 MG RE SUPP: 10.0000 mg | Freq: Every day | RECTAL | Status: DC | PRN

## 2014-03-30 MED ORDER — SODIUM CHLORIDE 0.9 % IJ SOLN: 3.0000 mL | INTRAMUSCULAR | Status: DC | PRN

## 2014-03-30 MED ORDER — ONDANSETRON HCL 4 MG PO TABS: 4.0000 mg | ORAL_TABLET | Freq: Four times a day (QID) | ORAL | Status: DC | PRN

## 2014-03-30 MED ORDER — ALBUTEROL SULFATE (2.5 MG/3ML) 0.083% IN NEBU: 2.5000 mg | INHALATION_SOLUTION | Freq: Four times a day (QID) | RESPIRATORY_TRACT | Status: DC | PRN

## 2014-03-30 NOTE — Progress Notes (Signed)
CRITICAL VALUE ALERT  Critical value received:  Small right sided pneumo on AM CXR, stable per radiology  Date of notification:  03/30/2014  Time of notification:  0752  Critical value read back:Yes.    Nurse who received alert:  Anthoney Harada, RN  Responding MD:  Dr. Laneta Simmers on unit  Time MD responded:  [phone]

## 2014-03-30 NOTE — Progress Notes (Signed)
2 Days Post-Op Procedure(s) (LRB): CORONARY ARTERY BYPASS GRAFTING (CABG) (N/A) TRANSESOPHAGEAL ECHOCARDIOGRAM (TEE) (N/A) Subjective:  No complaints  Objective: Vital signs in last 24 hours: Temp:  [98.2 F (36.8 C)-99.3 F (37.4 C)] 99.3 F (37.4 C) (02/13 1200) Pulse Rate:  [47-102] 93 (02/13 1200) Cardiac Rhythm:  [-] Normal sinus rhythm (02/13 1200) Resp:  [10-27] 20 (02/13 1200) BP: (106-139)/(49-103) 123/62 mmHg (02/13 1200) SpO2:  [90 %-98 %] 97 % (02/13 1200) Arterial Line BP: (112-157)/(51-68) 119/51 mmHg (02/12 2000) Weight:  [85.1 kg (187 lb 9.8 oz)] 85.1 kg (187 lb 9.8 oz) (02/13 0500)  Hemodynamic parameters for last 24 hours:    Intake/Output from previous day: 02/12 0701 - 02/13 0700 In: 946.3 [P.O.:480; I.V.:366.3; IV Piggyback:100] Out: 1285 [Urine:1215; Chest Tube:70] Intake/Output this shift: Total I/O In: 70.6 [I.V.:70.6] Out: 400 [Urine:400]  General appearance: alert and cooperative Heart: regular rate and rhythm, S1, S2 normal, no murmur, click, rub or gallop Lungs: wheezes bilaterally Extremities: extremities normal, atraumatic, no cyanosis or edema Wound: dressings dry  Lab Results:  Recent Labs  03/29/14 1700 03/29/14 1719 03/30/14 0530  WBC 12.4*  --  9.1  HGB 10.9* 11.6* 9.9*  HCT 31.8* 34.0* 29.1*  PLT 222  --  210   BMET:  Recent Labs  03/29/14 0400  03/29/14 1719 03/30/14 0530  NA 131*  --  128* 128*  K 4.6  --  4.8 4.0  CL 103  --  98 97  CO2 21  --   --  23  GLUCOSE 136*  --  146* 156*  BUN 18  --  24* 27*  CREATININE 0.95  < > 1.10 1.17  CALCIUM 7.0*  --   --  7.1*  < > = values in this interval not displayed.  PT/INR:  Recent Labs  03/28/14 1300  LABPROT 17.5*  INR 1.42   ABG    Component Value Date/Time   PHART 7.309* 03/28/2014 2236   HCO3 19.6* 03/28/2014 2236   TCO2 17 03/29/2014 1719   ACIDBASEDEF 6.0* 03/28/2014 2236   O2SAT 96.0 03/28/2014 2236   CBG (last 3)   Recent Labs  03/30/14 0828  03/30/14 0940 03/30/14 1031  GLUCAP 113* 203* 220*    Assessment/Plan: S/P Procedure(s) (LRB): CORONARY ARTERY BYPASS GRAFTING (CABG) (N/A) TRANSESOPHAGEAL ECHOCARDIOGRAM (TEE) (N/A)  He is hemodynamically stable in sinus rhythm Mobilize Diabetes control: preop Hgb A1c was 12 on his insulin regimen. Will start Levemir 30 bid and continue SSI and meal coverage. Plan for transfer to step-down: see transfer orders   LOS: 7 days    Myron Stankovich K 03/30/2014

## 2014-03-30 NOTE — Progress Notes (Signed)
Patient arrived to room 27 from ICU. Patient is A/O, VSS, NSR on the monitor, pain 0/10. POC discussed with the patient, patient verbalized understanding. Patient currently resting in bed. [place], RN

## 2014-03-31 ENCOUNTER — Inpatient Hospital Stay (HOSPITAL_COMMUNITY): Payer: No Typology Code available for payment source

## 2014-03-31 LAB — GLUCOSE, CAPILLARY
GLUCOSE-CAPILLARY: 95 mg/dL (ref 70–99)
GLUCOSE-CAPILLARY: 120 mg/dL — AB (ref 70–99)
Glucose-Capillary: 116 mg/dL — ABNORMAL HIGH (ref 70–99)
Glucose-Capillary: 137 mg/dL — ABNORMAL HIGH (ref 70–99)
Glucose-Capillary: 164 mg/dL — ABNORMAL HIGH (ref 70–99)

## 2014-03-31 MED ORDER — DIPHENHYDRAMINE HCL 25 MG PO CAPS: 25.0000 mg | ORAL_CAPSULE | Freq: Every evening | ORAL | Status: DC | PRN

## 2014-03-31 MED ORDER — LISINOPRIL 2.5 MG PO TABS
2.5000 mg | ORAL_TABLET | Freq: Every day | ORAL | Status: DC
  Administered 2014-03-31: 2.5 mg via ORAL
  Filled 2014-03-31 (×2): qty 1

## 2014-03-31 MED ORDER — METOPROLOL TARTRATE 25 MG PO TABS
25.0000 mg | ORAL_TABLET | Freq: Two times a day (BID) | ORAL | Status: DC
  Administered 2014-03-31 – 2014-04-01 (×3): 25 mg via ORAL
  Filled 2014-03-31 (×4): qty 1

## 2014-03-31 NOTE — Progress Notes (Addendum)
      [address]       [place] [address]             [phone]        3 Days Post-Op Procedure(s) (LRB): CORONARY ARTERY BYPASS GRAFTING (CABG) (N/A) TRANSESOPHAGEAL ECHOCARDIOGRAM (TEE) (N/A)  Subjective: Patient sleeping, but awakened. He states he did not sleep much last night.  Objective: Vital signs in last 24 hours: Temp:  [97.5 F (36.4 C)-99.3 F (37.4 C)] 98.9 F (37.2 C) (02/14 0550) Pulse Rate:  [91-102] 101 (02/14 0550) Cardiac Rhythm:  [-] Normal sinus rhythm (02/13 2000) Resp:  [15-24] 18 (02/14 0550) BP: (116-157)/(61-103) 140/71 mmHg (02/14 0550) SpO2:  [93 %-98 %] 94 % (02/14 0424) Weight:  [220 lb 14.4 oz (100.2 kg)] 220 lb 14.4 oz (100.2 kg) (02/14 0213)  Pre op weight 95 kg Current Weight  03/31/14 220 lb 14.4 oz (100.2 kg)      Intake/Output from previous day: 02/13 0701 - 02/14 0700 In: 220.6 [P.O.:120; I.V.:100.6] Out: 400 [Urine:400]   Physical Exam:  Cardiovascular: RRR Pulmonary: Diminished at bases bilaterally; no rales, wheezes, or rhonchi. Abdomen: Soft, non tender, bowel sounds present. Extremities: Mild left lower extremity edema;SCD on right LE Wounds: Clean and dry.  No erythema or signs of infection. Neurologic: Grossly intact without focal deficits  Lab Results: CBC: Recent Labs  03/29/14 1700 03/29/14 1719 03/30/14 0530  WBC 12.4*  --  9.1  HGB 10.9* 11.6* 9.9*  HCT 31.8* 34.0* 29.1*  PLT 222  --  210   BMET:  Recent Labs  03/29/14 0400  03/29/14 1719 03/30/14 0530  NA 131*  --  128* 128*  K 4.6  --  4.8 4.0  CL 103  --  98 97  CO2 21  --   --  23  GLUCOSE 136*  --  146* 156*  BUN 18  --  24* 27*  CREATININE 0.95  < > 1.10 1.17  CALCIUM 7.0*  --   --  7.1*  < > = values in this interval not displayed.  PT/INR:  Lab Results  Component Value Date   INR 1.42 03/28/2014   INR 1.19 03/23/2014   ABG:  INR: Will add last result for INR, ABG once components are confirmed Will add  last 4 CBG results once components are confirmed  Assessment/Plan:  1. CV - SR in the 90's this am. On Lopressor 12.5 bid. Will increase to 25 bid for better BP and HR control. Start low dose ACE for better BP control. 2.  Pulmonary - On room air. Encourage incentive spirometer. Check CXR 3. Volume Overload - Lasix 40 this am 4.  Acute blood loss anemia - H and H yesterday 9.9 and 29.1  5. DM-CBGs 145/164/137. On Insulin. Pre op HGA1C 12.5. Will need close follow up with medical doctor. 6. Remove EPW in am 7.MS-somnolent but arousable. Has not had narcotics recently. Not hypoglycemic this am. Re checked patient after initial assessment, awake and alert. Patient states he is sleep deprived and was in a "deep sleep" earlier. 8. [place]ry PRN sleep  ZIMMERMAN,DONIELLE MPA-C 03/31/2014,8:58 AM   Chart reviewed, patient examined, agree with above. Glucose is under good control.  Weight is 10 lbs over preop.  Continue diuresis.

## 2014-03-31 NOTE — Discharge Instructions (Signed)
Activity: 1.May walk up steps °               2.No lifting more than ten pounds for four weeks.  °               3.No driving for four weeks. °               4.Stop any activity that causes chest pain, shortness of breath, dizziness, sweating or excessive weakness. °               5.Avoid straining. °               6.Continue with your breathing exercises daily. ° °Diet: Diabetic diet and Low fat, Low salt diet ° °Wound Care: May shower.  Clean wounds with mild soap and water daily. Contact the office at [phone] if any problems arise. ° °Coronary Artery Bypass Grafting, Care After °Refer to this sheet in the next few weeks. These instructions provide you with information on caring for yourself after your procedure. Your health care provider may also give you more specific instructions. Your treatment has been planned according to current medical practices, but problems sometimes occur. Call your health care provider if you have any problems or questions after your procedure. °WHAT TO EXPECT AFTER THE PROCEDURE °Recovery from surgery will be different for everyone. Some people feel well after 3 or 4 weeks, while for others it takes longer. After your procedure, it is typical to have the following: °· Nausea and a lack of appetite.   °· Constipation. °· Weakness and fatigue.   °· Depression or irritability.   °· Pain or discomfort at your incision site. °HOME CARE INSTRUCTIONS °· Take medicines only as directed by your health care provider. Do not stop taking medicines or start any new medicines without first checking with your health care provider. °· Take your pulse as directed by your health care provider. °· Perform deep breathing as directed by your health care provider. If you were given a device called an incentive spirometer, use it to practice deep breathing several times a day. Support your chest with a pillow or your arms when you take deep breaths or cough. °· Keep incision areas clean, dry, and  protected. Remove or change any bandages (dressings) only as directed by your health care provider. You may have skin adhesive strips over the incision areas. Do not take the strips off. They will fall off on their own. °· Check incision areas daily for any swelling, redness, or drainage. °· If incisions were made in your legs, do the following: °¨ Avoid crossing your legs.   °¨ Avoid sitting for long periods of time. Change positions every 30 minutes.   °¨ Elevate your legs when you are sitting. °· Wear compression stockings as directed by your health care provider. These stockings help keep blood clots from forming in your legs. °· Take showers once your health care provider approves. Until then, only take sponge baths. Pat incisions dry. Do not rub incisions with a washcloth or towel. Do not take baths, swim, or use a hot tub until your health care provider approves. °· Eat foods that are high in fiber, such as raw fruits and vegetables, whole grains, beans, and nuts. Meats should be lean cut. Avoid canned, processed, and fried foods. °· Drink enough fluid to keep your urine clear or pale yellow. °· Weigh yourself every day. This helps identify if you are retaining fluid that may make your heart and lungs   work harder. °· Rest and limit activity as directed by your health care provider. You may be instructed to: °¨ Stop any activity at once if you have chest pain, shortness of breath, irregular heartbeats, or dizziness. Get help right away if you have any of these symptoms. °¨ Move around frequently for short periods or take short walks as directed by your health care provider. Increase your activities gradually. You may need physical therapy or cardiac rehabilitation to help strengthen your muscles and build your endurance. °¨ Avoid lifting, pushing, or pulling anything heavier than 10 lb (4.5 kg) for at least 6 weeks after surgery. °· Do not drive until your health care provider approves.  °· Ask your health  care provider when you may return to work. °· Ask your health care provider when you may resume sexual activity. °· Keep all follow-up visits as directed by your health care provider. This is important. °SEEK MEDICAL CARE IF: °· You have swelling, redness, increasing pain, or drainage at the site of an incision. °· You have a fever. °· You have swelling in your ankles or legs. °· You have pain in your legs.   °· You gain 2 or more pounds (0.9 kg) a day. °· You are nauseous or vomit. °· You have diarrhea.  °SEEK IMMEDIATE MEDICAL CARE IF: °· You have chest pain that goes to your jaw or arms. °· You have shortness of breath.   °· You have a fast or irregular heartbeat.   °· You notice a "clicking" in your breastbone (sternum) when you move.   °· You have numbness or weakness in your arms or legs. °· You feel dizzy or light-headed.   °MAKE SURE YOU: °· Understand these instructions. °· Will watch your condition. °· Will get help right away if you are not doing well or get worse. °Document Released: 08/21/2004 Document Revised: 06/18/2013 Document Reviewed: 07/11/2012 °[employer]® Patient Information ©2015 [employer]. This information is not intended to replace advice given to you by your health care provider. Make sure you discuss any questions you have with your health care provider. ° ° ° °

## 2014-03-31 NOTE — Discharge Summary (Signed)
Physician Discharge Summary       [address]       [place] [address]             [phone]    Patient ID: Danny [name] MRN: [id number] DOB/AGE: 04/26/1955 59 y.o.  Admit date: 03/23/2014 Discharge date: 04/01/2014  Admission Diagnoses: 1. S/p acute lateral wall MI 2. Multivessel CAD 3. History of DM  Discharge Diagnoses:  1. S/p acute lateral wall MI 2. Multivessel CAD 3. Pericarditis 4. History of DM 5. ABL anemia  Procedure (s):  Median sternotomy, extracorporeal circulation, coronary artery bypass grafting x3 (left internal mammary artery to left anterior descending, saphenous vein graft to first diagonal, saphenous vein graft to obtuse marginal), endoscopic vein harvest, right thigh by Dr. Dorris Fetch on 03/28/2014.  History of Presenting Illness: Danny [name] is a 59 yo man with type II diabetes who presented this morning with a cc/o chest pain. He denies hypertension, hyperlipidemia, is a nonsmoker and has no family history of CAD. He has no prior history of heart trouble. He started having substernal chest pressure and burning pain about a week ago. The pain waxed and waned, but was progressively getting worse with each episode. Finally this morning it was so bad he presented to [facility] ED. He had ST depression concerning for inferior ischemia. While there his pain worsened and a repeat ECG showed lateral ST elevation. He was transferred to the [facility] cath lab as a code STEMI. Dr. Excell Seltzer did a cath which showed 3 vessel CAD. The diagonal was the likely culprit vessel. There was no indication for angioplasty. He is currently pain free. The LV gram did show anterolateral hypokinesis. Dr. Dorris Fetch was consulted for he consideration of coronary artery bypass grafting surgery. Potential risks, benefits, and complications were discussed and the patient agreed to proceed with surgery. Carotid duplex US showed no significant bilateral internal carotid artery  stenosis. He underwent a CABG x 3 on 03/28/2014.  Brief Hospital Course:  The patient was extubated later the evening of surgery without difficulty. He remained afebrile and hemodynamically stable. Theone Murdoch, a line, chest tubes, and foley were removed early in the post operative course. Lopressor was started and titrated accordingly. He was volume over loaded and diuresed. He was weaned off the insulin drip. Once he/she was tolerating a diet, home insulin was gradually restarted. The patient's HGA1C pre op was 12.5. His glucose remained well controlled. He will need close medical follow up after discharge. He had ABL anemia post op. He did not require a post op transfusion. His last H and H was 9.9 and 29.1The patient was felt surgically stable for transfer from the ICU to PCTU for further convalescence on 03/30/2014. He continues to progress with cardiac rehab. He was ambulating on room air. He has been tolerating a diet and has had a bowel movement. Epicardial pacing wires and chest tube sutures will be removed prior to discharge. Provided the patient remains afebrile, hemodynamically stable, and pending morning round evaluation, he will be surgically stable for discharge on **/**/2016.   Latest Vital Signs: Blood pressure 146/85, pulse 92, temperature 98.6 F (37 C), temperature source Oral, resp. rate 18, height 5\' 8"  (1.727 m), weight 212 lb 9.6 oz (96.435 kg), SpO2 96 %.  Physical Exam: Cardiovascular: RRR Pulmonary: Diminished at bases bilaterally; no rales, wheezes, or rhonchi. Abdomen: Soft, non tender, bowel sounds present. Extremities: Mild left lower extremity edema;SCD on right LE Wounds: Clean and dry. No erythema  or signs of infection.  Discharge Condition:Stable  Recent laboratory studies:  Lab Results  Component Value Date   WBC 9.1 03/30/2014   HGB 9.9* 03/30/2014   HCT 29.1* 03/30/2014   MCV 86.4 03/30/2014   PLT 210 03/30/2014   Lab Results  Component Value Date    NA 128* 03/30/2014   K 4.0 03/30/2014   CL 97 03/30/2014   CO2 23 03/30/2014   CREATININE 1.17 03/30/2014   GLUCOSE 156* 03/30/2014      Diagnostic Studies: Ct Head Wo Contrast  03/26/2014   CLINICAL DATA:  59 year old diabetic male with recent myocardial infarction with left-sided weakness. Right leg weakness. Initial encounter.  EXAM: CT HEAD WITHOUT CONTRAST  TECHNIQUE: Contiguous axial images were obtained from the base of the skull through the vertex without intravenous contrast.  COMPARISON:  None.  FINDINGS: No intracranial hemorrhage.  No CT evidence of large acute infarct. If infarct is of high clinical concern (particularly posterior fossa infarct) MR may be considered.  No hydrocephalus.  No intracranial mass lesion noted on this unenhanced exam.  Mastoid air cells, middle ear cavities and visualized paranasal sinuses are clear.  IMPRESSION: No CT evidence of large acute infarct.  Please see above.   Electronically Signed   By: Lacy Duverney M.D.   On: 03/26/2014 18:19   Dg Chest Port 1 View  03/30/2014   CLINICAL DATA:  Status post CABG  EXAM: PORTABLE CHEST - 1 VIEW  COMPARISON:  03/29/2014  FINDINGS: Interval removal of thoracic drains and Swan-Ganz catheter. A right IJ sheath is in stable position.  Postoperative atelectasis is unchanged. No edema or effusion. There is a tiny right apical pneumothorax was present on the previous study with the benefit of this comparison.  Stable cardiomegaly post CABG.  These results were called by telephone at the time of interpretation on 03/30/2014 at 7:52 am to RN Revonda Standard, who verbally acknowledged these results.  IMPRESSION: 1. Tiny right apical pneumothorax. With the benefit of this study, the pneumothorax is stable from yesterday. 2. Improved lung volumes but postoperative atelectasis persists.   Electronically Signed   By: Marnee Spring M.D.   On: 03/30/2014 07:54   Discharge Instructions    Amb Referral to Nutrition and Diabetic E    Complete  by:  As directed      Amb Referral to Nutrition and Diabetic E    Complete by:  As directed            Discharge Medications:    Medication List    TAKE these medications        aspirin 325 MG EC tablet  Take 1 tablet (325 mg total) by mouth daily.     atorvastatin 80 MG tablet  Commonly known as:  LIPITOR  Take 1 tablet (80 mg total) by mouth daily at 6 PM.     furosemide 40 MG tablet  Commonly known as:  LASIX  Take 1 tablet (40 mg total) by mouth daily. For 1 week     insulin NPH Human 100 UNIT/ML injection  Commonly known as:  HUMULIN N,NOVOLIN N  Inject 20 Units into the skin 2 (two) times daily.     insulin regular 100 units/mL injection  Commonly known as:  NOVOLIN R,HUMULIN R  Inject 5 Units into the skin 2 (two) times daily.     lisinopril 5 MG tablet  Commonly known as:  PRINIVIL,ZESTRIL  Take 1 tablet (5 mg total) by mouth daily.  metoprolol tartrate 25 MG tablet  Commonly known as:  LOPRESSOR  Take 1 tablet (25 mg total) by mouth 2 (two) times daily.     multivitamin tablet  Take 1 tablet by mouth daily.     oxyCODONE 5 MG immediate release tablet  Commonly known as:  Oxy IR/ROXICODONE  Take 1-2 tablets (5-10 mg total) by mouth every 3 (three) hours as needed for severe pain.     potassium chloride SA 20 MEQ tablet  Commonly known as:  K-DUR,KLOR-CON  Take 1 tablet (20 mEq total) by mouth daily. For 7 Days     traMADol 50 MG tablet  Commonly known as:  ULTRAM  Take 1-2 tablets (50-100 mg total) by mouth every 4 (four) hours as needed for moderate pain.       The patient has been discharged on:   1.Beta Blocker:  Yes [x   ]                              No   [   ]                              If No, reason:  2.Ace Inhibitor/ARB: Yes [ x  ]                                     No  [    ]                                     If No, reason:  3.Statin:   Yes [ x  ]                  No  [   ]                  If No, reason:  4.Ecasa:   Yes  [ x  ]                  No   [   ]                  If No, reason:  Follow Up Appointments: Follow-up Information    Follow up with Tonny Bollman, MD.   Specialty:  Cardiology   Why:  Call for a follow up appointment for 2 weeks   Contact information:   [address] [place] [address] [phone]       Follow up with Loreli Slot, MD On 05/14/2014.   Specialty:  Cardiothoracic Surgery   Why:  Appointment is at 12:45 PA/LAT CXR to be taken (at [facility] which is in the same building as Dr. Sunday Corn office) one hour prior to office appointment.   Contact information:   [address] [employer] [address] [place] [address] [phone]       Follow up with Medical Doctor.   Why:  Obtain a primary care physician for further diabetes management and surveillance of HGA1C  12.5      Follow up with Tonny Bollman, MD.   Specialty:  Cardiology   Why:  The office will call you to make an appoinment., If you do not hear  from them, please contact them., You should be seen within 2-3 weeks.   Contact information:   [address] [place] [address] [phone]       Signed: Marrion Coy 04/01/2014, 12:15 PM

## 2014-04-01 LAB — GLUCOSE, CAPILLARY
GLUCOSE-CAPILLARY: 105 mg/dL — AB (ref 70–99)
Glucose-Capillary: 250 mg/dL — ABNORMAL HIGH (ref 70–99)
Glucose-Capillary: 128 mg/dL — ABNORMAL HIGH (ref 70–99)
Glucose-Capillary: 202 mg/dL — ABNORMAL HIGH (ref 70–99)
Glucose-Capillary: 150 mg/dL — ABNORMAL HIGH (ref 70–99)

## 2014-04-01 MED ORDER — INSULIN ASPART 100 UNIT/ML ~~LOC~~ SOLN
4.0000 [IU] | Freq: Three times a day (TID) | SUBCUTANEOUS | Status: DC
  Administered 2014-04-01: 4 [IU] via SUBCUTANEOUS

## 2014-04-01 MED ORDER — INSULIN ASPART 100 UNIT/ML ~~LOC~~ SOLN: 0.0000 [IU] | Freq: Every day | SUBCUTANEOUS | Status: DC

## 2014-04-01 MED ORDER — FUROSEMIDE 40 MG PO TABS
40.0000 mg | ORAL_TABLET | Freq: Every day | ORAL | Status: DC
  Administered 2014-04-01: 40 mg via ORAL
  Filled 2014-04-01 (×2): qty 1

## 2014-04-01 MED ORDER — TRAMADOL HCL 50 MG PO TABS: 50.0000 mg | ORAL_TABLET | ORAL | Status: DC | PRN

## 2014-04-01 MED ORDER — POTASSIUM CHLORIDE CRYS ER 20 MEQ PO TBCR
20.0000 meq | EXTENDED_RELEASE_TABLET | Freq: Every day | ORAL | Status: DC
  Administered 2014-04-01: 20 meq via ORAL
  Filled 2014-04-01: qty 1

## 2014-04-01 MED ORDER — OXYCODONE HCL 5 MG PO TABS: 5.0000 mg | ORAL_TABLET | ORAL | Status: DC | PRN

## 2014-04-01 MED ORDER — LISINOPRIL 5 MG PO TABS: 5.0000 mg | ORAL_TABLET | Freq: Every day | ORAL | Status: DC

## 2014-04-01 MED ORDER — INSULIN ASPART 100 UNIT/ML ~~LOC~~ SOLN
0.0000 [IU] | Freq: Three times a day (TID) | SUBCUTANEOUS | Status: DC
  Administered 2014-04-01: 2 [IU] via SUBCUTANEOUS

## 2014-04-01 MED ORDER — LISINOPRIL 5 MG PO TABS
5.0000 mg | ORAL_TABLET | Freq: Every day | ORAL | Status: DC
  Administered 2014-04-01: 5 mg via ORAL
  Filled 2014-04-01 (×2): qty 1

## 2014-04-01 MED ORDER — ATORVASTATIN CALCIUM 80 MG PO TABS: 80.0000 mg | ORAL_TABLET | Freq: Every day | ORAL | Status: DC

## 2014-04-01 MED ORDER — METOPROLOL TARTRATE 25 MG PO TABS: 25.0000 mg | ORAL_TABLET | Freq: Two times a day (BID) | ORAL | Status: DC

## 2014-04-01 MED ORDER — ASPIRIN 325 MG PO TBEC: 325.0000 mg | DELAYED_RELEASE_TABLET | Freq: Every day | ORAL | Status: DC

## 2014-04-01 MED ORDER — FUROSEMIDE 40 MG PO TABS: 40.0000 mg | ORAL_TABLET | Freq: Every day | ORAL | Status: DC

## 2014-04-01 MED ORDER — POTASSIUM CHLORIDE CRYS ER 20 MEQ PO TBCR: 20.0000 meq | EXTENDED_RELEASE_TABLET | Freq: Every day | ORAL | Status: DC

## 2014-04-01 NOTE — Progress Notes (Signed)
Nursing note Pt EPW pulled per protocol and as ordered bp 147/87, pt 92 heart rate. All ends intact slight resistance met with Rt atrial wires. Will continue to closely monitor patient. Pt reminded to lie supine approximately one hour. Clelia Trabucco, Bettina Gavia RN

## 2014-04-01 NOTE — Progress Notes (Signed)
[phone] Family will not be here until 5 pm so education completed with pt. Discussed sternal precautions, IS, ex ed and adhering to diabetic heart healthy diet. Gave pt diabetic and heart healthy diets. Pt notified that his HGA1C was 12.8. He stated will go back to see Medical Doctor as he knew it had gotten to over 8 but not this high. Pt stated he has been taking his insulin and watching his diet. Pt stated he had been educated on carb counting before. Put on discharge OHS video for him to view. Could answer teach back questions.  Discussed CRP 2 and pt gave permission to refer to [facility] Phase 2. Luetta Nutting RN BSN 04/01/2014 2:31 PM

## 2014-04-01 NOTE — Progress Notes (Addendum)
      [address]       [place] [address]             [phone]      4 Days Post-Op Procedure(s) (LRB): CORONARY ARTERY BYPASS GRAFTING (CABG) (N/A) TRANSESOPHAGEAL ECHOCARDIOGRAM (TEE) (N/A)   Subjective:  Danny [name] has no complaints this morning.  He states he is ready to go home.  + ambulation + BM  Objective: Vital signs in last 24 hours: Temp:  [98.5 F (36.9 C)-99.6 F (37.6 C)] 98.6 F (37 C) (02/15 0618) Pulse Rate:  [100-104] 100 (02/15 0618) Cardiac Rhythm:  [-] Sinus tachycardia (02/14 2000) Resp:  [17-20] 18 (02/15 0618) BP: (136-152)/(74-80) 143/79 mmHg (02/15 0618) SpO2:  [94 %-97 %] 97 % (02/15 0618) Weight:  [212 lb 9.6 oz (96.435 kg)] 212 lb 9.6 oz (96.435 kg) (02/15 0618)  Intake/Output from previous day: 02/14 0701 - 02/15 0700 In: 820 [P.O.:820] Out: 350 [Urine:350]  General appearance: alert, cooperative and no distress Heart: regular rate and rhythm Lungs: clear to auscultation bilaterally Abdomen: soft, non-tender; bowel sounds normal; no masses,  no organomegaly Extremities: edema trace Wound: clean and dry  Lab Results:  Recent Labs  03/29/14 1700 03/29/14 1719 03/30/14 0530  WBC 12.4*  --  9.1  HGB 10.9* 11.6* 9.9*  HCT 31.8* 34.0* 29.1*  PLT 222  --  210   BMET:  Recent Labs  03/29/14 1719 03/30/14 0530  NA 128* 128*  K 4.8 4.0  CL 98 97  CO2  --  23  GLUCOSE 146* 156*  BUN 24* 27*  CREATININE 1.10 1.17  CALCIUM  --  7.1*    PT/INR: No results for input(s): LABPROT, INR in the last 72 hours. ABG    Component Value Date/Time   PHART 7.309* 03/28/2014 2236   HCO3 19.6* 03/28/2014 2236   TCO2 17 03/29/2014 1719   ACIDBASEDEF 6.0* 03/28/2014 2236   O2SAT 96.0 03/28/2014 2236   CBG (last 3)   Recent Labs  03/31/14 2020 04/01/14 0009 04/01/14 0500  GLUCAP 116* 105* 128*    Assessment/Plan: S/P Procedure(s) (LRB): CORONARY ARTERY BYPASS GRAFTING (CABG) (N/A) TRANSESOPHAGEAL  ECHOCARDIOGRAM (TEE) (N/A)  1. CV- Sinus Tach, pressure remains mildly elevated- continue Lopressor, increase Lisinopril to 5 mg daily 2. Pulm- off oxygen, continue IS 3. Renal- patient is hypervolemic weight is up 21 lbs since admission, creatinine has been stable, will start oral Lasix 40 mg QD and potassium 4. DM- sugars are controlled, continue current regimen 5. Dispo- patient stable, will D/C EPW today, start diuresis for hypervolemia, patient wants to go home today, will discuss with staff   LOS: 9 days    Raford Pitcher, ERIN 04/01/2014  Patient seen and examined, agree with above He is chomping a the bit to go home Will let him go home today but has to be 6 hours after pacing wires removed- so 5 PM

## 2014-04-01 NOTE — Progress Notes (Signed)
CARDIAC REHAB PHASE I   PRE:  Rate/Rhythm: 98 SR  BP:  Supine:   Sitting: 160/54  Standing:    SaO2: 95%RA  MODE:  Ambulation: 550 ft   POST:  Rate/Rhythm: 107 ST  BP:  Supine:   Sitting: 150/90  Standing:    SaO2: 97-100%RA hall, 92-95%RA room [phone] Pt stated he wanted to go home. Asked how far he had been walking and he stated 50 ft.  Pt did not remember walking with me prior to surgery and we walked twice. Pt walked 550 ft on RA with rolling walker and asst x1 with fairly steady gait. Pt stopped a few times to rest. RA sats good. Would recommend rolling walker for home use. Notified RN to ask for walker in progression rounds. To chair after walk. RN to notify me if pt for discharge as pt needs family here for ed.   Luetta Nutting, RN BSN  04/01/2014 9:14 AM

## 2014-04-01 NOTE — Progress Notes (Signed)
Pt discharged to home under care of brother.  Pt spoke to brother and pt about AVS.  Pt stated he understood and brother verified that he would relay discharge teaching to wife at home.  Pt c/o no chest pain, no difficulties urinating, vital signs taken before pt left floor.  IV removed.  All belongings went with pt including cell phone, cell phone charger, clothing and glasses.

## 2014-04-09 ENCOUNTER — Emergency Department: Payer: Self-pay | Admitting: Internal Medicine

## 2014-04-15 ENCOUNTER — Inpatient Hospital Stay: Payer: Self-pay | Admitting: Internal Medicine

## 2014-04-16 DIAGNOSIS — I2510 Atherosclerotic heart disease of native coronary artery without angina pectoris: Secondary | ICD-10-CM

## 2014-04-16 DIAGNOSIS — I10 Essential (primary) hypertension: Secondary | ICD-10-CM

## 2014-04-16 DIAGNOSIS — I5023 Acute on chronic systolic (congestive) heart failure: Secondary | ICD-10-CM

## 2014-04-16 DIAGNOSIS — I361 Nonrheumatic tricuspid (valve) insufficiency: Secondary | ICD-10-CM

## 2014-04-17 ENCOUNTER — Telehealth: Payer: Self-pay

## 2014-04-17 NOTE — Telephone Encounter (Signed)
Patient contacted regarding discharge from [facility] on 04/17/14.  Patient understands to follow up with Tereso Newcomer on 04/24/14 at 2:20 at [facility] office. Patient understands discharge instructions? yes Patient understands medications and regiment? yes Patient understands to bring all medications to this visit? yes  Spoke w/ pt's wife.  She reports that pt has not been d/c'd from the hospital yet, but she will call back if he has any questions when he gets home.

## 2014-04-19 ENCOUNTER — Telehealth: Payer: Self-pay | Admitting: Cardiovascular Disease

## 2014-04-19 NOTE — Telephone Encounter (Signed)
New problem   Pt need work note to go back to work on Monday 3.7.16, that stated the restrictions he was told by the doctor. Please call pt concerning this note.

## 2014-04-19 NOTE — Telephone Encounter (Signed)
Informed the pt that per Dr Excell Seltzer after his CABG his return to work will be by Dr Hendrickson's, and 3/7 return to work date is too soon.  Informed the pt that per Dr Excell Seltzer he advises the pt to wait until he see's Scott next week and to assess work activity.  Pt verbalized understanding and agrees with this plan.

## 2014-04-19 NOTE — Telephone Encounter (Signed)
After CABG his rtn to work will be determined by Dr Dorris Fetch. With surgery 2/11 I don't think he'll be able to return to work 3/7. Would wait until he sees [place] and we can see what type of work he does.

## 2014-04-19 NOTE — Telephone Encounter (Signed)
Pt calling to ask Dr Excell Seltzer if he could clear him to return to work part-time with no restrictions for Monday 04/22/14.  Advised the pt given his recent surgical hx of CABG and recent discharge from the hospital, I would wait until he is seen by Tereso Newcomer PA-C next Wednesday 04/24/14 at 2:20 pm.  Advised the pt at that office visit to ask if clearance for work is advisable and safe.  Pt verbalized understanding and agrees with this plan.  Will route this message to Dr Excell Seltzer and nurse for further review.

## 2014-04-24 ENCOUNTER — Ambulatory Visit (INDEPENDENT_AMBULATORY_CARE_PROVIDER_SITE_OTHER): Payer: No Typology Code available for payment source | Admitting: Physician Assistant

## 2014-04-24 ENCOUNTER — Telehealth: Payer: Self-pay | Admitting: Cardiology

## 2014-04-24 ENCOUNTER — Encounter: Payer: Self-pay | Admitting: Physician Assistant

## 2014-04-24 VITALS — BP 110/70 | HR 82 | Ht 68.0 in | Wt 191.0 lb

## 2014-04-24 DIAGNOSIS — Z951 Presence of aortocoronary bypass graft: Secondary | ICD-10-CM | POA: Diagnosis not present

## 2014-04-24 DIAGNOSIS — R0602 Shortness of breath: Secondary | ICD-10-CM | POA: Diagnosis not present

## 2014-04-24 DIAGNOSIS — I255 Ischemic cardiomyopathy: Secondary | ICD-10-CM | POA: Diagnosis not present

## 2014-04-24 DIAGNOSIS — IMO0002 Reserved for concepts with insufficient information to code with codable children: Secondary | ICD-10-CM

## 2014-04-24 DIAGNOSIS — E1165 Type 2 diabetes mellitus with hyperglycemia: Secondary | ICD-10-CM

## 2014-04-24 DIAGNOSIS — I2510 Atherosclerotic heart disease of native coronary artery without angina pectoris: Secondary | ICD-10-CM | POA: Diagnosis not present

## 2014-04-24 DIAGNOSIS — E785 Hyperlipidemia, unspecified: Secondary | ICD-10-CM

## 2014-04-24 DIAGNOSIS — I5022 Chronic systolic (congestive) heart failure: Secondary | ICD-10-CM

## 2014-04-24 LAB — BASIC METABOLIC PANEL
BUN: 23 mg/dL (ref 6–23)
CO2: 33 mEq/L — ABNORMAL HIGH (ref 19–32)
Calcium: 9.2 mg/dL (ref 8.4–10.5)
Chloride: 92 mEq/L — ABNORMAL LOW (ref 96–112)
Creatinine, Ser: 0.97 mg/dL (ref 0.40–1.50)
GFR: 84.44 mL/min (ref >60.00)
Glucose, Bld: 562 mg/dL (ref 70–99)
Potassium: 4 mEq/L (ref 3.5–5.1)
Sodium: 129 mEq/L — ABNORMAL LOW (ref 135–145)

## 2014-04-24 LAB — BRAIN NATRIURETIC PEPTIDE: Pro B Natriuretic peptide (BNP): 1741 pg/mL — ABNORMAL HIGH (ref 0.0–100.0)

## 2014-04-24 MED ORDER — FUROSEMIDE 40 MG PO TABS: 40.0000 mg | ORAL_TABLET | Freq: Every day | ORAL | Status: DC

## 2014-04-24 NOTE — Patient Instructions (Signed)
LAB WORK TODAY BMET, BNP  CARDIAC REHAB AT [facility]  FOLLOW UP WITH DR. Excell Seltzer IN ABOUT 4 WEEKS

## 2014-04-24 NOTE — Telephone Encounter (Signed)
Called about high BS, 500. Unable to leave message.  Corine Shelter PA-C 04/24/2014 5:37 PM

## 2014-04-24 NOTE — Progress Notes (Signed)
Cardiology Office Note   Date:  04/24/2014   ID:  Danny, [name] [birth year], MRN [id number]  PCP:  No PCP Per Patient  Cardiologist:  Dr. Tonny Bollman     Chief Complaint  Patient presents with  . Coronary Artery Disease    s/p CABG  . Congestive Heart Failure  . Hospitalization Follow-up     History of Present Illness: Danny [name] is a 59 y.o. male with a hx of long-standing diabetes mellitus. He was admitted 2/6-2/15 with acute lateral myocardial infarction. He had stuttering symptoms for 1 week prior to coming to the emergency room and evolving ECG changes suspicious for STEMI. Emergent cardiac catheterization demonstrated diffuse 3 vessel CAD and mildly reduced LV function. Diagonal vessel was felt to possibly be the culprit. He was felt to have completed his infarction. There were no good targets for PCI.   Recommendation was to proceed with CABG. Echocardiogram demonstrated EF 50%. He underwent CABG with Dr. Dorris Fetch 03/28/14 (LIMA-LAD, SVG-D1, SVG-OM).  Postoperative course was fairly uneventful.  He was readmitted to [facility] with a/c systolic CHF.  He was DC last Wednesday.  BNP was 7542.  CXR demonstrated CHF.  Echo was done and demonstrated EF 35-40%.  He was diuresed.  Troponin was 0.14 >> 0.15 (? Demand ischemia).  Of note, he was started on Plavix + ASA at DC.  His ACEI was DC'd and he was placed on Entresto.  He also had his beta-blocker changed to Coreg.  I did not have all of his records from [facility] until he was leaving the office.  He returns for FU.  He is feeling much better.  Denies significant dyspnea.  Has a NP cough.  Denies orthopnea, PND, edema.  Chest is sore.  No syncope or dizziness.    Studies/Reports Reviewed Today:  Echo at [facility] 04/16/14 Mod L Eff EF 35-40% Impaired relaxation Diff HK - Ant and AS best preserved Mild LVH Mild LAE Mild TR  Carotid US 03/26/14 1-39% internal carotid artery stenosis bilaterally  Echo 04/02/14 - HK  of inferior and inferolateral walls. EF 50%. Mild LVH.  - Aortic valve: Trileaflet; normal thickness leaflets. There was no regurgitation. - Aortic root: The aortic root was normal in size. - Mitral valve: Structurally normal valve. There was mild regurgitation. - Left atrium: The atrium was at the upper limits of normal in size. - Right ventricle: The cavity size was normal. Wall thickness was normal. Systolic function was normal. - Tricuspid valve: There was mild regurgitation.  Cardiac cath 03/23/14 LM:  Patent.  LAD:  Prox 50%, mid 75-80%.  D1 trifurcates into 3 small subbranches. Ostium has 90% stenosis. I LCx:  prox 90%, OM1 80% stenosis, OM2 80% stenosis. RCA:  PDA 80%  EF:  Anterolateral wall is akinetic.  EF is 40-45%.     Past Medical History  Diagnosis Date  . Acute MI, lateral wall, initial episode of care 03/23/2014  . Type 2 diabetes mellitus 03/23/2014  . CAD (coronary artery disease)     a. s/p Lat MI 2/16 >> s/p CABG  . Ischemic cardiomyopathy     a. Echo at [facility] 3/16:  EF 35-40%, diff HK, mild LVH, mild LAE, mild TR  . Chronic systolic CHF (congestive heart failure)     Past Surgical History  Procedure Laterality Date  . Left heart catheterization with coronary angiogram N/A 03/23/2014    Procedure: LEFT HEART CATHETERIZATION WITH CORONARY ANGIOGRAM;  Surgeon: Micheline Chapman, MD;  Location: [facility] CATH LAB;  Service: Cardiovascular;  Laterality: N/A;  . Coronary artery bypass graft N/A 03/28/2014    Procedure: CORONARY ARTERY BYPASS GRAFTING (CABG);  Surgeon: Loreli Slot, MD;  Location: [facility] OR;  Service: Open Heart Surgery;  Laterality: N/A;  . Tee without cardioversion N/A 03/28/2014    Procedure: TRANSESOPHAGEAL ECHOCARDIOGRAM (TEE);  Surgeon: Loreli Slot, MD;  Location: [facility] OR;  Service: Open Heart Surgery;  Laterality: N/A;     Current Outpatient Prescriptions  Medication Sig Dispense Refill  . aspirin EC 81 MG tablet Take 81 mg by mouth daily.    Marland Kitchen  atorvastatin (LIPITOR) 80 MG tablet Take 1 tablet (80 mg total) by mouth daily at 6 PM. 30 tablet 3  . carvedilol (COREG) 25 MG tablet Take 25 mg by mouth 2 (two) times daily.     . clopidogrel (PLAVIX) 75 MG tablet Take 75 mg by mouth once.     . furosemide (LASIX) 40 MG tablet Take 40 mg by mouth 2 (two) times daily.    . insulin NPH Human (HUMULIN N,NOVOLIN N) 100 UNIT/ML injection Inject 25 Units into the skin 2 (two) times daily.     . insulin regular (NOVOLIN R,HUMULIN R) 100 units/mL injection Inject 10 Units into the skin 2 (two) times daily.     . Multiple Vitamin (MULTIVITAMIN) tablet Take 1 tablet by mouth daily.    . sacubitril-valsartan (ENTRESTO) 24-26 MG Take 1 tablet by mouth 2 (two) times daily.     No current facility-administered medications for this visit.    Allergies:   Review of patient's allergies indicates no known allergies.    Social History:  The patient  reports that he has never smoked. He does not have any smokeless tobacco history on file.   Family History:  The patient's family history includes Diabetes in his brother; Healthy in his father and mother; Heart attack in his father. There is no history of Stroke.    ROS:   Please see the history of present illness.   Review of Systems  Constitution: Negative for decreased appetite and fever.  Respiratory: Positive for cough. Negative for wheezing.        NP  All other systems reviewed and are negative.    PHYSICAL EXAM: VS:  BP 110/70 mmHg  Pulse 82  Ht 5\' 8"  (1.727 m)  Wt 191 lb (86.637 kg)  BMI 29.05 kg/m2    Wt Readings from Last 3 Encounters:  04/01/14 212 lb 9.6 oz (96.435 kg)     GEN: Well nourished, well developed, in no acute distress HEENT: normal Neck: no JVD, no masses Cardiac:  Normal S1/S2, RRR; no murmur,  no rubs or gallops, no edema  Respiratory:  Faint crackles in the bases bilaterally, no wheezing GI: soft, nontender, nondistended, + BS MS: no deformity or atrophy Skin:  warm and dry  Neuro:  CNs II-XII intact, Strength and sensation are intact Psych: Normal affect   EKG:  EKG is ordered today.  It demonstrates:   NSR, HR 82, rightward axis, TWI V5-6   Recent Labs: 03/23/2014: B Natriuretic Peptide 556.0*; TSH 0.591 03/29/2014: Magnesium 2.9* 03/30/2014: BUN 27*; Creatinine 1.17; Hemoglobin 9.9*; Platelets 210; Potassium 4.0; Sodium 128*    Lipid Panel    Component Value Date/Time   CHOL 144 03/24/2014 0314   TRIG 81 03/24/2014 0314   HDL 47 03/24/2014 0314   CHOLHDL 3.1 03/24/2014 0314   VLDL 16 03/24/2014 0314   LDLCALC 81  03/24/2014 0314      ASSESSMENT AND PLAN:  Coronary artery disease S/P CABG x 3:  He is doing better now after recent readmission for a/c HF.  I have encouraged him to start Cardiac Rehab once he has seen Dr. Dorris Fetch.      -  Continue ASA, Plavix, statin, beta-blocker, ARB (Entresto).    -  I will review with Dr. Tonny Bollman whether to stay on Plavix.    -  Refer to Cardiac Rehab at [facility].  Ischemic cardiomyopathy:  Continue Entresto, beta-blocker.  Check BMET today.  Consider Spironolactone.  Will need to plan FU echo 3 mos post CABG.  If EF < 35%, refer to EP for ICD.  Chronic systolic CHF (congestive heart failure):  Volume appears improved since DC from [facility] last week.  Check BMET, BNP today.  If BNP elevated, increase Lasix.   Hyperlipidemia:  Continue statin.   Diabetes mellitus type 2, uncontrolled:  FU with PCP.  Hgb A1c in [facility] recently 11.5.   Current medicines are reviewed at length with the patient today.  The patient does not have concerns regarding medicines.  The following changes have been made:  no change  Labs/ tests ordered today include:  Orders Placed This Encounter  Procedures  . Basic Metabolic Panel (BMET)  . B Nat Peptide  . EKG 12-Lead    Disposition:   FU with Dr. Tonny Bollman 4 weeks.   Signed, Brynda Rim, MHS 04/24/2014 2:07 PM    [facility]  [facility] [address] [place], [place], [place]  [address] Phone: [phone]; Fax: [phone]

## 2014-04-25 ENCOUNTER — Telehealth: Payer: Self-pay | Admitting: *Deleted

## 2014-04-25 DIAGNOSIS — E1165 Type 2 diabetes mellitus with hyperglycemia: Secondary | ICD-10-CM

## 2014-04-25 DIAGNOSIS — R0602 Shortness of breath: Secondary | ICD-10-CM

## 2014-04-25 NOTE — Telephone Encounter (Signed)
Attempted to contact patient regarding Critical High glucose result from yesterday. Danny [name], [place], unable yesterday to reach patient and no voicemail available. Triage nurse calling today and unable to reach patient and no voicemail available today either. No emergency contact listed.

## 2014-04-26 ENCOUNTER — Telehealth: Payer: Self-pay | Admitting: *Deleted

## 2014-04-26 ENCOUNTER — Telehealth: Payer: Self-pay | Admitting: Physician Assistant

## 2014-04-26 ENCOUNTER — Ambulatory Visit: Payer: Self-pay | Admitting: Family Medicine

## 2014-04-26 NOTE — Telephone Encounter (Signed)
s/w pt about his lab results. I stated we had tried to reach him to go over these results; glucose 562 and that per Bing Neighbors. PA yesterday he wanted pt to see PCP today. I gave instructions on increase lasix. Bmet, bnp 3/18. Pt advised I will d/w PA

## 2014-04-26 NOTE — Telephone Encounter (Signed)
pt notified per Bing Neighbors. PA and Dr. Excell Seltzer to make sure to stay on the ASA and Plavix.

## 2014-04-26 NOTE — Telephone Encounter (Signed)
Reviewed with Dr. Tonny Bollman  Have patient to remain on ASA and Plavix Tereso Newcomer, PA-C   04/26/2014 1:21 PM

## 2014-04-26 NOTE — Telephone Encounter (Signed)
advised pt per Bing Neighbors. PA that he needs to go to Urgent Care or PCP today to have blood sugar checked. May need meds for DM adjusted. Pt agreeable to plan of care.

## 2014-05-03 ENCOUNTER — Telehealth: Payer: Self-pay | Admitting: *Deleted

## 2014-05-03 ENCOUNTER — Other Ambulatory Visit (INDEPENDENT_AMBULATORY_CARE_PROVIDER_SITE_OTHER): Payer: No Typology Code available for payment source | Admitting: *Deleted

## 2014-05-03 DIAGNOSIS — R0602 Shortness of breath: Secondary | ICD-10-CM

## 2014-05-03 DIAGNOSIS — E1165 Type 2 diabetes mellitus with hyperglycemia: Secondary | ICD-10-CM

## 2014-05-03 LAB — BASIC METABOLIC PANEL
BUN: 20 mg/dL (ref 6–23)
CO2: 29 meq/L (ref 19–32)
Calcium: 9.1 mg/dL (ref 8.4–10.5)
Chloride: 98 mEq/L (ref 96–112)
Creatinine, Ser: 1.01 mg/dL (ref 0.40–1.50)
GFR: 80.59 mL/min (ref >60.00)
GLUCOSE: 505 mg/dL — AB (ref 70–99)
Potassium: 4.3 mEq/L (ref 3.5–5.1)
SODIUM: 132 meq/L — AB (ref 135–145)

## 2014-05-03 LAB — BRAIN NATRIURETIC PEPTIDE: PRO B NATRI PEPTIDE: 1092 pg/mL — AB (ref 0.0–100.0)

## 2014-05-03 NOTE — Telephone Encounter (Signed)
Critical lab result called in for Glucose drawn today with result of 505. Reviewed with DOD. Patient is currently on insulin but does not have a listed PCP.  For today, Dr. Tenny Craw advises patient to go to Urgent Care or Emergency Department or contact his PCP (if he has established one now) to have his blood sugar checked/treated. Attempted to call patient however his phone is not set up for voicemail and he is not answering the phone and he does not have an alternate number. Will try to reach him again in a few minutes. He has long standing DM and elevated BS levels/A1C levels. Attempted to call patient again at his only number listed. No answer. No Voicemail. Will forward to Triage for Monday, March 21st. Will also routed to [facility], [place].  He is scheduled to see [facility] on May 08, 2014 at 11 am.   He has been advised previously that he needs to establish a PCP to monitor his medical care, including diabetes. Patient has critical high Glucose last week and it was difficult to get in touch with him by phone. Office finally did get to speak with him and advised him of the following:    Tarri Fuller, CMA at 04/26/2014 4:33 PM     Status: Signed       Expand All Collapse All   advised pt per Bing Neighbors. PA that he needs to go to Urgent Care or PCP today to have blood sugar checked. May need meds for DM adjusted. Pt agreeable to plan of care.

## 2014-05-03 NOTE — Addendum Note (Signed)
Addended by: Tonita Phoenix on: 05/03/2014 09:12 AM   Modules accepted: Orders

## 2014-05-06 ENCOUNTER — Ambulatory Visit: Payer: Self-pay | Admitting: Family

## 2014-05-06 NOTE — Telephone Encounter (Signed)
Continuing attempts to contact this patient. Will continue throughout the day.

## 2014-05-06 NOTE — Telephone Encounter (Signed)
Attempted to call patient this morning to check on him since he was not able to be reached on Friday afternoon to inform him of his critical Blood Glucose level. Still unable to reach him due to no answer and no voicemail availability on his phone. He does not have an emergency contact number. Will try again later today.

## 2014-05-06 NOTE — Telephone Encounter (Signed)
Attempted again to contact patient without success. Patient has appointment scheduled on 05/08/14 with [facility], 05/14/14 with Dr. Dorris Fetch and 05/23/14 with Dr. Excell Seltzer. Will continue to try to contact patient.

## 2014-05-07 NOTE — Telephone Encounter (Signed)
Called patient. He answered phone and stated he has been keeping check on his BS. Says he checks it on his home machine about every 4 hours. York Spaniel he is averaging 300-375 range. Adjusting insulin as needed. York Spaniel that he had an appointment scheduled for [facility] for 05/08/14, however he said they had to cancel that appointment and re-schedule him for a week later. Patient denies s/sx of hyperglycemia at this time - no blurred vision, headache, thirst, frequency, confusion. He states that when his sugar does go up "too high" that he usually does notice being "fuzzy headed and not thinking as sharp". Patient states he is very aware he needs to get his insulin better adjusted or changed to something better/stronger. He does plan on keeping his appointments. Discussed that he should proceed to Emergency Department whenever/if he begins to feel s/sx of hyperglycemia, as discussed above, and/or for elevated BS readings. Patient verbalized agreement and understanding with plan of care.

## 2014-05-07 NOTE — Telephone Encounter (Signed)
Agree. Thank you, Tereso Newcomer, PA-C   05/07/2014 11:10 AM

## 2014-05-08 ENCOUNTER — Other Ambulatory Visit: Payer: Self-pay | Admitting: Thoracic Surgery (Cardiothoracic Vascular Surgery)

## 2014-05-08 ENCOUNTER — Ambulatory Visit: Payer: No Typology Code available for payment source | Admitting: *Deleted

## 2014-05-08 DIAGNOSIS — Z951 Presence of aortocoronary bypass graft: Secondary | ICD-10-CM

## 2014-05-10 ENCOUNTER — Other Ambulatory Visit: Payer: Self-pay

## 2014-05-10 MED ORDER — FUROSEMIDE 40 MG PO TABS: 60.0000 mg | ORAL_TABLET | Freq: Two times a day (BID) | ORAL | Status: DC

## 2014-05-13 ENCOUNTER — Ambulatory Visit (INDEPENDENT_AMBULATORY_CARE_PROVIDER_SITE_OTHER): Payer: Self-pay | Admitting: Physician Assistant

## 2014-05-13 ENCOUNTER — Ambulatory Visit
Admission: RE | Admit: 2014-05-13 | Discharge: 2014-05-13 | Disposition: A | Discharge status: Home / Self Care | Payer: No Typology Code available for payment source | Source: Ambulatory Visit | Attending: Thoracic Surgery (Cardiothoracic Vascular Surgery) | Admitting: Thoracic Surgery (Cardiothoracic Vascular Surgery)

## 2014-05-13 VITALS — BP 115/77 | HR 84 | Resp 20 | Ht 68.0 in | Wt 190.0 lb

## 2014-05-13 DIAGNOSIS — Z951 Presence of aortocoronary bypass graft: Secondary | ICD-10-CM

## 2014-05-13 NOTE — Progress Notes (Signed)
  HPI: Patient returns for routine postoperative follow-up having undergone CABG x3 on 04/01/2014. The patient's early postoperative recovery while in the hospital was unremarkable.  Since hospital discharge the patient reports that he is doing very well.  He is walking twice a day everyday without difficulty.  He denies chest pain and shortness of breath.  He is tolerating a diabetic diet, but continues to have problems with hyperglycemia with sugars running 300-400.  He is scheduled to see his Endocrinologist next week.  He is asking if he can return to work on modified duty.  He states he is going stir crazy.    Current Outpatient Prescriptions  Medication Sig Dispense Refill  . aspirin EC 81 MG tablet Take 81 mg by mouth daily.    Marland Kitchen atorvastatin (LIPITOR) 80 MG tablet Take 1 tablet (80 mg total) by mouth daily at 6 PM. 30 tablet 3  . carvedilol (COREG) 25 MG tablet Take 25 mg by mouth 2 (two) times daily.     . clopidogrel (PLAVIX) 75 MG tablet Take 75 mg by mouth once.     . furosemide (LASIX) 40 MG tablet Take 1.5 tablets (60 mg total) by mouth 2 (two) times daily. 90 tablet 3  . insulin NPH Human (HUMULIN N,NOVOLIN N) 100 UNIT/ML injection Inject 25 Units into the skin 2 (two) times daily.     . insulin regular (NOVOLIN R,HUMULIN R) 100 units/mL injection Inject 10 Units into the skin 2 (two) times daily.     . Multiple Vitamin (MULTIVITAMIN) tablet Take 1 tablet by mouth daily.    . sacubitril-valsartan (ENTRESTO) 24-26 MG Take 1 tablet by mouth 2 (two) times daily.     No current facility-administered medications for this visit.    Physical Exam:  BP 115/77 mmHg  Pulse 84  Resp 20  Ht 5\' 8"  (1.727 m)  Wt 190 lb (86.183 kg)  BMI 28.90 kg/m2  SpO2 97%  Gen: no apparent distress Heart: RRR Lungs: CTA Skin: incisions well healed Neuro: grossly intact  Diagnostic Tests:  CXR: no pneumothorax, small residual pleural fluid on left, post surgical changes  A/P:  1. S/P CABG x  3 doing well 2. DM- remains uncontrolled.  Patient again educated on the importance of good diet and sugar control.  He was instructed his blood sugars should be less than 150 and that he may not feel well at first in that range being his sugars are so high.  He is scheduled to follow up with Endocrinologist next week. 3. 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 Dispo- RTC prn, patient instructed he may resume driving short distances.  He was instructed he could resume full normal activity in May 2016 which is 12 weeks from his surgery.  He was instructed to contact our office should problems arise  Lowella Dandy, PA-C [facility] [phone]

## 2014-05-14 ENCOUNTER — Ambulatory Visit: Payer: No Typology Code available for payment source | Admitting: Thoracic Surgery (Cardiothoracic Vascular Surgery)

## 2014-05-23 ENCOUNTER — Encounter: Payer: Self-pay | Admitting: Cardiovascular Disease

## 2014-05-23 ENCOUNTER — Ambulatory Visit (INDEPENDENT_AMBULATORY_CARE_PROVIDER_SITE_OTHER): Payer: No Typology Code available for payment source | Admitting: Cardiovascular Disease

## 2014-05-23 VITALS — BP 140/82 | HR 82 | Ht 68.0 in | Wt 200.0 lb

## 2014-05-23 DIAGNOSIS — I2510 Atherosclerotic heart disease of native coronary artery without angina pectoris: Secondary | ICD-10-CM

## 2014-05-23 DIAGNOSIS — I5022 Chronic systolic (congestive) heart failure: Secondary | ICD-10-CM

## 2014-05-23 MED ORDER — CARVEDILOL 25 MG PO TABS: 25.0000 mg | ORAL_TABLET | Freq: Two times a day (BID) | ORAL | Status: DC

## 2014-05-23 MED ORDER — CLOPIDOGREL BISULFATE 75 MG PO TABS: 75.0000 mg | ORAL_TABLET | Freq: Every day | ORAL | Status: DC

## 2014-05-23 MED ORDER — SACUBITRIL-VALSARTAN 24-26 MG PO TABS: 1.0000 | ORAL_TABLET | Freq: Two times a day (BID) | ORAL | Status: DC

## 2014-05-23 MED ORDER — FUROSEMIDE 40 MG PO TABS: 60.0000 mg | ORAL_TABLET | Freq: Two times a day (BID) | ORAL | Status: DC

## 2014-05-23 MED ORDER — ATORVASTATIN CALCIUM 80 MG PO TABS: 80.0000 mg | ORAL_TABLET | Freq: Every day | ORAL | Status: DC

## 2014-05-23 NOTE — Patient Instructions (Signed)
Your physician recommends that you return for lab work in: 3 MONTHS (BMP and BNP)  Your physician has requested that you have an echocardiogram in 3 MONTHS. Echocardiography is a painless test that uses sound waves to create images of your heart. It provides your doctor with information about the size and shape of your heart and how well your heart's chambers and valves are working. This procedure takes approximately one hour. There are no restrictions for this procedure.  Your physician recommends that you schedule a follow-up appointment in: 3 MONTHS with Tereso Newcomer PA-C  Your physician wants you to follow-up in: 6 MONTHS with Dr Excell Seltzer.  You will receive a reminder letter in the mail two months in advance. If you don't receive a letter, please call our office to schedule the follow-up appointment.

## 2014-05-23 NOTE — Progress Notes (Signed)
Cardiology Office Note   Date:  05/24/2014   ID:  Danny, [name] 08-Mar-1955, MRN [id number]  PCP:  Danny Chafe, MD  Cardiologist:  Tonny Bollman, MD    No chief complaint on file.    History of Present Illness: Danny [name] is a 59 y.o. male who presents for follow-up of coronary artery disease. He initially presented in February 2016 with an acute lateral wall MI. The patient had a late presentation and at cardiac catheterization he was diagnosed with diffuse three-vessel coronary artery disease. He ultimately underwent coronary bypass surgery February 11 with a LIMA to LAD, vein graft to diagonal, and vein graft obtuse marginal. The patient developed postinfarction pericarditis prior to bypass surgery, but had minimal symptoms. At the time of surgery, he was noted to have severe hemorrhagic pericarditis with a bloody pericardial effusion. He was rehospitalized in early March for acute congestive heart failure. He describes symptoms of acute pulmonary edema and noted rapid improvement with BiPAP and diuresis. He now is doing quite well. He denies shortness of breath, chest pain, chest pressure, or leg swelling. He is back to work on a regular basis. He denies lightheadedness or syncope. He denies orthopnea or PND.   Past Medical History  Diagnosis Date  . Acute MI, lateral wall, initial episode of care 03/23/2014  . Type 2 diabetes mellitus 03/23/2014  . CAD (coronary artery disease)     a. s/p Lat MI 2/16 >> s/p CABG  . Ischemic cardiomyopathy     a. Echo at [facility] 3/16:  EF 35-40%, diff HK, mild LVH, mild LAE, mild TR  . Chronic systolic CHF (congestive heart failure)     Past Surgical History  Procedure Laterality Date  . Left heart catheterization with coronary angiogram N/A 03/23/2014    Procedure: LEFT HEART CATHETERIZATION WITH CORONARY ANGIOGRAM;  Surgeon: Micheline Chapman, MD;  Location: [facility] CATH LAB;  Service: Cardiovascular;  Laterality: N/A;  .  Coronary artery bypass graft N/A 03/28/2014    Procedure: CORONARY ARTERY BYPASS GRAFTING (CABG);  Surgeon: Loreli Slot, MD;  Location: [facility] OR;  Service: Open Heart Surgery;  Laterality: N/A;  . Tee without cardioversion N/A 03/28/2014    Procedure: TRANSESOPHAGEAL ECHOCARDIOGRAM (TEE);  Surgeon: Loreli Slot, MD;  Location: [facility] OR;  Service: Open Heart Surgery;  Laterality: N/A;    Current Outpatient Prescriptions  Medication Sig Dispense Refill  . aspirin EC 81 MG tablet Take 81 mg by mouth daily.    Marland Kitchen atorvastatin (LIPITOR) 80 MG tablet Take 1 tablet (80 mg total) by mouth daily at 6 PM. 30 tablet 11  . carvedilol (COREG) 25 MG tablet Take 1 tablet (25 mg total) by mouth 2 (two) times daily. 60 tablet 11  . clopidogrel (PLAVIX) 75 MG tablet Take 1 tablet (75 mg total) by mouth daily. 30 tablet 11  . furosemide (LASIX) 40 MG tablet Take 1.5 tablets (60 mg total) by mouth 2 (two) times daily. 90 tablet 11  . insulin NPH Human (HUMULIN N,NOVOLIN N) 100 UNIT/ML injection Inject 25 Units into the skin 2 (two) times daily.     . insulin regular (NOVOLIN R,HUMULIN R) 100 units/mL injection Inject 10 Units into the skin 2 (two) times daily.     . Multiple Vitamin (MULTIVITAMIN) tablet Take 1 tablet by mouth daily.    . sacubitril-valsartan (ENTRESTO) 24-26 MG Take 1 tablet by mouth 2 (two) times daily. 60 tablet 11   No current facility-administered medications for  this visit.    Allergies:   Review of patient's allergies indicates no known allergies.   Social History:  The patient  reports that he has never smoked. He does not have any smokeless tobacco history on file.   Family History:  The patient's  family history includes Diabetes in his brother; Healthy in his father and mother; Heart attack in his father. There is no history of Stroke.    ROS:  Please see the history of present illness.  All other systems are reviewed and negative.   PHYSICAL EXAM: VS:  BP 140/82 mmHg   Pulse 82  Ht  (1.727 m)  Wt 200 lb (90.719 kg)  BMI 30.42 kg/m2  SpO2 96% , BMI Body mass index is 30.42 kg/(m^2). GEN: Well nourished, well developed, in no acute distress HEENT: normal Neck: no JVD, no masses. No carotid bruits Cardiac: RRR without murmur or gallop                Respiratory:  clear to auscultation bilaterally, normal work of breathing GI: soft, nontender, nondistended, + BS MS: no deformity or atrophy Ext: no pretibial edema, pedal pulses 2+= bilaterally Skin: warm and dry, no rash Neuro:  Strength and sensation are intact Psych: euthymic mood, full affect  EKG:  EKG is not ordered today.  Recent Labs: 03/23/2014: B Natriuretic Peptide 556.0*; TSH 0.591 03/29/2014: Magnesium 2.9* 03/30/2014: Hemoglobin 9.9*; Platelets 210 05/03/2014: BUN 20; Creatinine 1.01; Potassium 4.3; Pro B Natriuretic peptide (BNP) 1092.0*; Sodium 132*   Lipid Panel     Component Value Date/Time   CHOL 144 03/24/2014 0314   TRIG 81 03/24/2014 0314   HDL 47 03/24/2014 0314   CHOLHDL 3.1 03/24/2014 0314   VLDL 16 03/24/2014 0314   LDLCALC 81 03/24/2014 0314      Wt Readings from Last 3 Encounters:  05/23/14 200 lb (90.719 kg)  05/13/14 190 lb (86.183 kg)  04/24/14 191 lb (86.637 kg)     Cardiac Studies Reviewed: 2-D echocardiogram 03/23/2014: Study Conclusions  - Left ventricle: There is severe hypokinesis of the basal and mid inferior and inferolateral walls. Overall LVEF is mildly impaired, estimated at 50%. The cavity size was normal. There was mild concentric hypertrophy. Systolic function was normal. The estimated ejection fraction was 50%. Wall motion was normal; there were no regional wall motion abnormalities. Left ventricular diastolic function parameters were normal. - Aortic valve: Trileaflet; normal thickness leaflets. There was no regurgitation. - Aortic root: The aortic root was normal in size. - Mitral valve: Structurally normal valve. There  was mild regurgitation. - Left atrium: The atrium was at the upper limits of normal in size. - Right ventricle: The cavity size was normal. Wall thickness was normal. Systolic function was normal. - Right atrium: The atrium was normal in size. - Tricuspid valve: There was mild regurgitation. - Pulmonic valve: There was no regurgitation. - Pulmonary arteries: The main pulmonary artery was normal-sized. Systolic pressure was within the normal range. - Inferior vena cava: The vessel was normal in size. - Pericardium, extracardiac: There was no pericardial effusion.  Intraoperative TEE 03/28/2014: Study Conclusions  - Left ventricle: Systolic function was moderately reduced. The estimated ejection fraction was in the range of 35% to 40%. Diffuse hypokinesis. Akinesis of the anterior myocardium. Hypokinesis of the anterolateral myocardium. Hypokinesis of the lateral myocardium. - Aortic valve: No evidence of vegetation. There was trivial regurgitation. - Mitral valve: No evidence of vegetation. - Atrial septum: No defect or patent foramen  ovale was identified. Echo contrast study showed no right-to-left atrial level shunt, following an increase in RA pressure induced by provocative maneuvers. - Tricuspid valve: No evidence of vegetation. - Pulmonic valve: No evidence of vegetation. - Pericardium, extracardiac: A moderate, free-flowing pericardial effusion was identified posterior to the heart and along the right ventricular free wall. The fluid had no internal echoes. There was a large right pleural effusion. There was a large left pleural effusion.  ASSESSMENT AND PLAN: 1.  CAD, native vessel: The patient is stable following coronary bypass surgery without symptoms of angina. He is on appropriate medical therapy with aspirin, clopidogrel, carvedilol, and valsartan.  2. Ischemic cardiomyopathy: Recommend repeat echocardiogram prior to his 3 month  follow-up visit. Continue current medical program area  3. Hemorrhagic pericarditis: His friction rub has resolved. He is status post cardiac surgery with no evidence of recurrence.  4. Hyperlipidemia: The patient is tolerating atorvastatin 80 mg daily. Will arrange follow-up lipids.  5. Essential hypertension: Blood pressure is well controlled on carvedilol and entresto.  6. Type 2 diabetes: Notes that glycemic control is slowly improving with dietary changes. The patient is seeing an endocrinologist tomorrow.   Current medicines are reviewed with the patient today.  The patient does not have concerns regarding medicines.  The following changes have been made:  no change  Labs/ tests ordered today include:   Orders Placed This Encounter  Procedures  . Basic Metabolic Panel (BMET)  . B Nat Peptide  . 2D Echocardiogram with contrast    Disposition:   FU 3 months Tereso Newcomer, [place]  Signed, Tonny Bollman, MD  05/24/2014 3:43 AM    [facility] [address] [place], [place], [place]  [address] Phone: [phone]; Fax: [phone]

## 2014-06-16 NOTE — Discharge Summary (Signed)
PATIENT NAME:  Danny [name], Danny [name] MR#:  [id number] DATE OF BIRTH:  14-Mar-1955  DATE OF ADMISSION:  04/15/2014 DATE OF DISCHARGE:  04/17/2014  DISCHARGE DIAGNOSES:  1.  Acute systolic congestive heart failure, ejection fraction 35% to 40%.  2.  Elevated troponin due to demand ischemia from congestive heart failure.  3.  Hypertension.  4.  Diabetes.  5.  Coronary artery disease.  CONDITION: Stable.   CODE STATUS: Full code.   HOME MEDICATIONS: Please refer to the medication reconciliation list.   DIET: Low-sodium, low-fat, low-cholesterol, ADA diet.   ACTIVITY: As tolerated.   FOLLOWUP CARE: Follow up with PCP and Dr. Mariah Milling, cardiologist, in [place] within 1 to 2 weeks.   REASON FOR ADMISSION: Shortness of breath for 5 days.   HISTORY OF PRESENT ILLNESS: A 59 year old Caucasian male with a history of diabetes, CAD and recent MI, status post CABG 3 weeks ago, who presented to the ED with shortness of breath for 5 days. For detailed history and physical examination, please refer to the admission note dictated by me. The patient was diagnosed with new onset acute CHF and respiratory failure with hypoxia. The patient was placed on BiPAP and started Lasix 40 mg IV b.i.d. and nitroglycerin drip. After the above-mentioned treatment, the patient's symptoms have much improved. The patient is off BiPAP and then off oxygen by nasal cannula. The patient's lung sounds became clear. According to cardiology, the patient's echocardiograph showed an ejection fraction of 35% to 40%. According to the cardiologist's recommendation, the patient was discontinued on lisinopril but will start [facility]  tomorrow.   For diabetes, the patient has been treated with sliding scale and Levemir.   For CAD, the patient has been treated with aspirin, statin and lisinopril, according to cardiology consult, start Plavix.  The patient has no complaints. Vital signs are stable. He is clinically stable and will be  discharged to home today. I discussed the patient's discharge plan with the patient, nurse, case manager and Dr. Herbie Baltimore, cardiologist.   TIME SPENT: About 42 minutes.   ____________________________ Shaune Pollack, MD qc:JT D: 04/17/2014 16:29:55 ET T: 04/18/2014 14:57:38 ET JOB#: [id number]  cc: Shaune Pollack, MD, <Dictator> Shaune Pollack MD ELECTRONICALLY SIGNED 04/18/2014 18:07

## 2014-06-16 NOTE — Consult Note (Signed)
General Aspect Primary Cardiologist: Dr. Burt Knack, MD _________________  59 year old male with history of CAD with recent lateral wall STEMI s/p 3 vessel CABG on 03/28/2014, ICM, and IDDM who presents to [facility] today with increased SOB x 1 day.  ________________  PMH: 1. CAD with recent lateral wall STEMI s/p 3 vessel CABG on 03/28/2014 (LIMA-->LAD, SVG-->OM, SVG diag 1) 2. Ischemic cardiomyopathy 3. IDDM _________________   Present Illness 59 year old male with the above problem list who presented to [facility] today with increaesd SOB x 1 day.  He was recently admitted to 2/6 [facility] for chest pain. Code STEMI was called and patient was transferred to [facility] for emergent cardiac cath. Prior to this date he did not have any previously known cardiac history. Upon his arrival to [facility] he was taken to the cath lab emergently which showed severe multivessel coronary artery disease with severe stenosis of the pLCx, mLAD, proximal first diagonal branch, and right PDA.  Acute lateral wall infarction with anterolateral akinesis, suspected diagonal territory infarct with late clinical presentation. Moderate segmental LV systolic dysfunction with estimated LVEF 40-45%. There were not any targets technically amenable to PCI. It was felt he would be better treated with surgical revascularization once he recovers from his myocardial infarction. Dr. Roxan Hockey was consulted and he came to the Cath Lab and reviewed the patient's films. Echo on 2/6 to r/o pericardial effusion shhowed EF 50%, mild concentric hypertrophy, no regional wall motion abnormalities, there was no pericardial effusion. Cardiothoracic surgery reviewed the case and felt he would be a good candidate for CABG. He underwent 3 veseel CABG on 03/28/14 (LIMA-->LAD, SVG-->OM, SVG diag 1). TEE on 2//1 showed an EF of 35-40%, diffuse hypokinesis, akinesis of the anterior myocardium, hypokinesis of the anterolateral myocardium, hypokinesis of the lateral myocardium. A  moderate, free-flowing pericardial effusion was identified posterior to the heart and along the??right ventricular free wall. The fluid had no internal echoes.??There was a large right pleural effusion. There was a large left??pleural effusion. He remained afebrile and hemodynamically stable.   He was volume overloaded post op and diursed. He continued to progress with cardiac rehab. Per discharge summary he was to take Lasix 40 mg daily x [redacted] week along with KCl 20 mR+Eq for 7 days, meaning he would have stopped taking this on 2/21. He does report in the ED today that he has been continuing to take this bid since his discharge. Hgb A1C post op was 12.5%.   He has been drinking increased amounts of water since his discharge.  Drinking 6-8 glassed daily. Has been sleeping sitting up, sometimes in his recliner. On 2/29 he became quite SOB and went to see his PCP. He was diagnosed with bronchitis and treated with a Z pack. He breathing continued to decline prompting him to call EMS. Upon their arrival he was placed on O2 with improvement in his breathing. He denies any chest pain. He is uncertain of his baseline weight. Labs showed pBNP 7542, troponin 0.09-->0.15-->0.14, negative blood cultures, CXR with new interstitial edema and effusions.   Physical Exam:  GEN well developed, no acute distress   HEENT hearing intact to voice   NECK supple   RESP normal resp effort  crackles   CARD Regular rate and rhythm  No murmur   ABD denies tenderness  soft   LYMPH negative neck   EXTR trace  pitting edema   SKIN normal to palpation   NEURO motor/sensory function intact   PSYCH  alert, A+O to time, place, person, good insight   Review of Systems:  Subjective/Chief Complaint SOB, cough   General: Fatigue   Skin: No Complaints   ENT: No Complaints   Eyes: No Complaints   Neck: No Complaints   Respiratory: Short of breath   Cardiovascular: Dyspnea  Orthopnea  Edema   Gastrointestinal:  No Complaints   Genitourinary: No Complaints   Vascular: No Complaints   Musculoskeletal: No Complaints   Neurologic: No Complaints   Hematologic: No Complaints   Endocrine: No Complaints   Psychiatric: No Complaints   Review of Systems: All other systems were reviewed and found to be negative   Family & Social History:  Family and Social History:  Family History Coronary Artery Disease  Hypertension   Social History negative tobacco, negative ETOH, negative Illicit drugs   Place of Living Home     Diabetes:    CABG x 3:    Denies surgical history.:   Home Medications:  Medication Instructions Status  lisinopril 5 mg oral tablet 1 tab(s) orally once a day Active  atorvastatin 80 mg oral tablet 1 tab(s) orally once a day (at bedtime) Active  Klor-Con 20 mEq oral tablet, extended release 1 tab(s) orally once a day for 7 days Active  furosemide 40 mg oral tablet 1 tab(s) orally once a day Active  NovoLIN R human recombinant 100 units/mL injectable solution 5 unit(s) injectable 2 times a day Active  NovoLIN N human recombinant 100 units/mL subcutaneous suspension 25 unit(s) subcutaneous 2 times a day Active  aspirin 81 mg oral tablet 1 tab(s) orally once a day Active  azithromycin 250 mg oral tablet 1 tab(s) orally once a day Active  Augmentin 875 mg-125 mg oral tablet 1 tab(s) orally every 12 hours Active  ibuprofen 200 mg oral tablet 3 tab(s) orally once a day, As Needed Active  NovoLIN R human recombinant 100 units/mL injectable solution 10 unit(s) injectable 2 times a day Active   Lab Results:  Routine Chem:  01-Mar-16 04:39   Result Comment TROPONIN - RESULTS VERIFIED BY REPEAT TESTING.  - PREV. C/ 04-15-14 2118 BY [email or web address]  Result(s) reported on 16 Apr 2014 at 06:48AM.  Cholesterol, Serum 118  Triglycerides, Serum 68  HDL (INHOUSE) 45  VLDL Cholesterol Calculated 14  LDL Cholesterol Calculated 59 (Result(s) reported on 16 Apr 2014 at 05:53AM.)  Glucose,  Serum  289  BUN 13  Creatinine (comp) 0.94  Sodium, Serum 137  Potassium, Serum 3.9  Chloride, Serum 101  CO2, Serum 26  Calcium (Total), Serum  8.4  Anion Gap 10  Osmolality (calc) 285  eGFR (African American) >60  eGFR (Non-African American) >60 (eGFR values <18m/min/1.73 m2 may be an indication of chronic kidney disease (CKD). Calculated eGFR, using the MRDR Study equation, is useful in  patients with stable renal function. The eGFR calculation will not be reliable in acutely ill patients when serum creatinine is changing rapidly. It is not useful in patients on dialysis. The eGFR calculation may not be applicable to patients at the low and high extremes of body sizes, pregnant women, and vegetarians.)  Magnesium, Serum 2.0 (1.8-2.4 THERAPEUTIC RANGE: 4-7 mg/dL TOXIC: > 10 mg/dL  -----------------------)  Cardiac:  01-Mar-16 01:18   Troponin I  0.15 (0.00-0.05 0.05 ng/mL or less: NEGATIVE  Repeat testing in 3-6 hrs  if clinically indicated. >0.05 ng/mL: POTENTIAL  MYOCARDIAL INJURY. Repeat  testing in 3-6 hrs if  clinically indicated. NOTE: An increase or decrease  of  30% or more on serial  testing suggests a  clinically important change)    04:39   Troponin I  0.14 (0.00-0.05 0.05 ng/mL or less: NEGATIVE  Repeat testing in 3-6 hrs  if clinically indicated. >0.05 ng/mL: POTENTIAL  MYOCARDIAL INJURY. Repeat  testing in 3-6 hrs if  clinically indicated. NOTE: An increase or decrease  of 30% or more on serial  testing suggests a  clinically important change)   EKG:  EKG Interp. by me   Interpretation EKG shows sinus tachycardia, 130, no significant st/t changes   Radiology Results:  XRay:    29-Feb-16 20:38, Chest Portable Single View  Chest Portable Single View   REASON FOR EXAM:    SOB  COMMENTS:       PROCEDURE: DXR - DXR PORTABLE CHEST SINGLE VIEW  - Apr 15 2014  8:38PM     CLINICAL DATA:  Sternotomy wires overlies stable enlarged  cardiac  silhouette. There is new interstitial edema pattern. Small  effusions.    EXAM:  PORTABLE CHEST - 1 VIEW    COMPARISON:  None.    FINDINGS:  Stable cardiac silhouette. There is new peripheral linear  interstitial pattern. No focal consolidation. Small effusions.     IMPRESSION:  New interstitial edema and effusions.Stable cardiomegaly.      Electronically Signed    By: Suzy Bouchard M.D.    On: 04/15/2014 21:01         Verified By: Rennis Golden, M.D.,  Cardiology:    01-Mar-16 12:05, Echo Doppler  Echo Doppler   REASON FOR EXAM:      COMMENTS:       PROCEDURE: [facility] - ECHO DOPPLER COMPLETE(TRANSTHOR)  - Apr 16 2014 12:05PM     RESULT: Echocardiogram Report    Patient Name:   SHADE KALEY Date of Exam: 04/16/2014  Medical Rec #:  [id number]                Custom1:  Date of Birth:  08-Jun-1955             Height:       68.0 in  Patient Age:    31 years              Weight:       220.0 lb  Patient Gender: M                     BSA:          2.13 m??    Indications: CHF  Sonographer:    Sherrie Sport RDCS  Referring Phys: Lenise Arena, E    Summary:   1. Moderate left pleural effusion.   2. Mildly to moderately decreased global left ventricular systolic   function.   3. Left ventricular ejection fraction, by visual estimation, is 35 to   40%.   4. Impaired relaxation pattern of LV diastolic filling.   5. Mild diffuce hypokinesis, anterior and anteroseptal wall best   preserved.   6. Mild left ventricular hypertrophy.   7. Normal right ventricular size and systolic function.   8. Mildly dilated left atrium.   9. Mild tricuspid regurgitation.  2D AND M-MODE MEASUREMENTS (normal ranges within parentheses):  Left Ventricle:          Normal  IVSd (2D):      1.37 cm (0.7-1.1)  LVPWd (2D):     1.53 cm (0.7-1.1) Aorta/LA:  Normal  LVIDd (2D):     4.99 cm (3.4-5.7) Aortic Root (2D): 2.80 cm (2.4-3.7)  LVIDs (2D):     4.30 cm            Left Atrium (2D): 4.60 cm (1.9-4.0)  LV FS (2D):     13.8 %   (>25%)  LV EF (2D):     29.4 %   (>50%)                                    Right Ventricle:                                    RVd (2D):        3.69 cm  LV DIASTOLIC FUNCTION:  MV Peak E: 0.74 m/s Decel Time: 104 msec  MV Peak A: 0.82 m/s  E/A Ratio: 0.90  SPECTRAL DOPPLER ANALYSIS (where applicable):  Mitral Valve:  MV P1/2 Time: 30.16 msec  MV Area, PHT: 7.29 cm??  Aortic Valve: AoV Max Vel: 1.17 m/s AoV Peak PG: 5.5 mmHg AoV Mean PG:  LVOT Vmax: 0.97 m/s LVOT VTI:  LVOT Diameter: 2.00 cm  AoV Area, Vmax: 2.61 cm?? AoV Area, VTI:  AoV Area, Vmn:  Tricuspid Valve and PA/RV Systolic Pressure: TR Max Velocity: 2.08 m/s RA   Pressure: 5 mmHg RVSP/PASP: 22.3 mmHg  Pulmonic Valve:  PV Max Velocity: 1.36 m/s PV Max PG: 7.5 mmHg PV Mean PG:    PHYSICIAN INTERPRETATION:  Left Ventricle: The left ventricular internal cavity size was normal. LV   posterior wall thickness was normal. Mild left ventricular hypertrophy.   Global LV systolic function was mildly to moderately decreased. Left     ventricular ejection fraction, by visual estimation, is 35 to 40%.   Spectral Doppler shows impaired relaxation pattern of LV diastolic   filling.  Right Ventricle: Normal right ventricular size, wall thickness, and   systolic function. The right ventricular size is normal. Global RV   systolic function is normal.  Left Atrium: The left atrium is mildly dilated.  Right Atrium: The right atrium is normal in size.  Pericardium: There is no evidence of pericardial effusion.  Mitral Valve: The mitral valve is normal in structure. Trace mitral valve   regurgitation is seen.  Tricuspid Valve: The tricuspid valve is normal. Mild tricuspid   regurgitation is visualized. The tricuspid regurgitant velocity is 2.08   m/s, and with an assumed right atrial pressure of 5 mmHg, the estimated   right ventricular systolic pressure is normal at 22.3  mmHg.  Aortic Valve: The aortic valve is normal. The aortic valve is   structurally normal, with no evidence of sclerosis or stenosis. No   evidence of aortic valve regurgitation is seen.  Pulmonic Valve: The pulmonic valve is normal. Trace pulmonic valve   regurgitation.  Aorta: The aortic root and ascending aorta are structurally normal, with   no evidence of dilitation.    [id number] Timothy Gollan MD  Electronically signed by [id number] Timothy Gollan MD  Signature Date/Time: 04/16/2014/2:22:20 PM    *** Final ***    IMPRESSION: .    Verified By: TIMOTHY J. GOLLAN, M.D., MD    No Known Allergies:   Vital Signs/Nurse's Notes:  **Vital Signs.:   01-Mar-16 13:22  Vital Signs Type Admission  Temperature Temperature (F) 97.7    Celsius 36.5  Pulse Pulse 115  Respirations Respirations 22  Systolic BP Systolic BP 182  Diastolic BP (mmHg) Diastolic BP (mmHg) 80  Mean BP 97  Pulse Ox % Pulse Ox % 92  Pulse Ox Activity Level  At rest  Oxygen Delivery 2L    Impression 59 year old male with history of CAD with recent lateral wall STEMI s/p 3 vessel CABG on 03/28/2014, ICM, and IDDM who presents to [facility] today with increased SOB x 1 day.  1. Acute on chronic systolic CHF: EF 99% suspect signidicant diet and fluid intake issues. he take lasix 40 BID, free water intake high at home --would change to  IV Lasix to 40 mg BID, TID if no response -KCl (liquid based per patient)  -Change Lopressor to Coreg 25 mg bid consider adding entresto BID  2. CAD s/p CABG as above: aspirin to 81 mg -Start Plavix 75 mg daily at discharge -Lipid checked while at [facility], and at goal, continue Lipitor 80 mg  3. Accelerated HTN: -Improved -Continue medications as above  4. IDDM: -Per IM   Electronic Signatures: Rise Mu (PA-C)  (Signed 01-Mar-16 11:06)  Authored: General Aspect/Present Illness, History and Physical Exam, Review of System, Family & Social History, Past Medical History, Home Medications,  Labs, EKG , Radiology, Allergies, Impression/Plan Ida Rogue (MD)  (Signed 01-Mar-16 15:58)  Authored: General Aspect/Present Illness, History and Physical Exam, Review of System, Home Medications, Labs, EKG , Radiology, Vital Signs/Nurse's Notes, Impression/Plan  Co-Signer: General Aspect/Present Illness, History and Physical Exam, Review of System, Family & Social History, Past Medical History, Home Medications, Labs, EKG , Radiology, Allergies, Impression/Plan   Last Updated: 01-Mar-16 15:58 by Ida Rogue (MD)

## 2014-06-16 NOTE — H&P (Signed)
PATIENT NAME:  Danny [name], Danny [name] MR#:  [id number] DATE OF BIRTH:  1955/11/28  DATE OF ADMISSION:  04/15/2014  PRIMARY CARE PHYSICIAN: Nonlocal.  REFERRING PHYSICIAN: Cecille Amsterdam. Mayford Knife, MD   CHIEF COMPLAINT: Shortness of breath for 5 days.   HISTORY OF PRESENT ILLNESS: A 59 year old Caucasian male with a history of diabetes, CAD, MI status post CABG 3 weeks ago, presenting to the ED with the above chief complaint. The patient is alert, awake, oriented, in no acute distress. The patient developed MI 3 weeks ago, was transferred to [facility], got triple bypass surgery. After surgery, the patient has been taking aspirin and statin. The patient was fine until 5 days ago the patient started to have shortness of breath, cough with sputum. The patient also complains of bilateral lower extremity edema today. The patient denies any fever or chills. No chest pain, palpitation, orthopnea, or nocturnal dyspnea. The patient denies any other symptoms.   PAST MEDICAL HISTORY: Coronary artery disease, MI recently, diabetes.   PAST SURGICAL HISTORY: CABG.   SOCIAL HISTORY: No smoking or drinking or illicit drugs.   FAMILY HISTORY: No hypertension, diabetes, heart attack or stroke. Both parents are healthy.   ALLERGIES: None.  HOME MEDICATIONS: Medication reconciliation list is not done yet; will update later.   PHYSICAL EXAMINATION:  VITAL SIGNS: Temperature 99.2, blood pressure 178/127, pulse 129, oxygen saturation at 89% on room air.  GENERAL: The patient is alert, awake, oriented, in no acute distress on BiPAP.  HEENT: Pupils round, equal, reactive to light and accommodation. Moist oral mucosa. Clear oropharynx.  NECK: Supple. No JVD or carotid bruit. No lymphadenopathy. No thyromegaly,  CARDIOVASCULAR: S1 and S2. Regular rate, rhythm. No murmurs, gallops.  PULMONARY: Bilateral air entry. No wheezing, but has bilateral basilar rales. No use of accessory muscles to breathe. On BiPAP.  ABDOMEN:  Soft. No distention or tenderness. No organomegaly. Bowel sounds are present.  EXTREMITIES: Bilateral lower extremity edema, 1 to 2+. No clubbing or cyanosis. No calf tenderness. Bilateral pedal pulses present.  SKIN: No rash or jaundice.  NEUROLOGIC: A and O x 3. No focal deficit. Power 5/5. Sensation intact.   LABORATORY DATA: Chest x-ray shows new interstitial edema and effusions, stable cardiomegaly. Troponin 0.09. CBC showed WBC 10, hemoglobin 12, platelets 380,000. Glucose 318, BUN 14, creatinine 0.86. Electrolytes normal except sodium 135. BNP 7542. INR 1.0. CK 61, CK-MB 1.6. EKG shows sinus tachycardia at 130 BPM.   IMPRESSIONS:  1.  New-onset acute congestive heart failure.  2.  Coronary artery disease, status post coronary artery bypass graft.  3.  Diabetes.  4.  Sinus tachycardia.  5.  Acute respiratory failure with hypoxia.   PLAN OF TREATMENT:  1.  The patient will be admitted to stepdown unit. We will continue BiPAP, start Lasix IV 40 mg b.i.d. In addition we will continue nitroglycerin drip for hypertension malignancy.  2.  Elevated troponin which is possibly due to demand ischemia. I will continue aspirin and statin, follow up cardiology consult from Dr. Mariah Milling.  3.  For diabetes, I will start sliding scale, continue patient's home medication.   I discussed the patient's condition and plan of treatment with the patient.  CODE STATUS: The patient wants full code.   CRITICAL TIME SPENT: About 56 minutes.   ____________________________ Shaune Pollack, MD qc:ST D: 04/15/2014 22:04:13 ET T: 04/15/2014 22:59:52 ET JOB#: [id number]  cc: Shaune Pollack, MD, <Dictator> Shaune Pollack MD ELECTRONICALLY SIGNED 04/17/2014 17:19

## 2014-06-18 ENCOUNTER — Telehealth: Payer: Self-pay

## 2014-06-18 MED ORDER — LOSARTAN POTASSIUM 50 MG PO TABS: 50.0000 mg | ORAL_TABLET | Freq: Every day | ORAL | Status: DC

## 2014-06-18 NOTE — Telephone Encounter (Signed)
I spoke with the pt and made him aware that Sherryll Burger is not covered by his insurance.  Per Dr Excell Seltzer the pt needs to take Losartan 50mg  daily in the place of Entresto.  The pt will finish his current supply of Entresto and then switch to Losartan. Pt verbalized understanding.  Rx sent to the pharmacy.

## 2014-06-28 DIAGNOSIS — I5022 Chronic systolic (congestive) heart failure: Secondary | ICD-10-CM

## 2014-07-16 ENCOUNTER — Telehealth: Payer: Self-pay

## 2014-07-16 ENCOUNTER — Telehealth: Payer: Self-pay | Admitting: Cardiovascular Disease

## 2014-07-16 NOTE — Telephone Encounter (Signed)
Left message on machine for pt to contact the office.   

## 2014-07-16 NOTE — Telephone Encounter (Signed)
20 minute conversation. I spoke with the pt and he complained of SOB and CP.  The pt said he cannot catch his breath with minimal activity and is SOB when walking up steps. The pt said he is not able to do the things that he used to do prior to surgery and feels like he is being held back by his breathing. The pt also complains of pain in the center of his chest that worsens with movement or touch.  I made the pt aware that this is muscular pain.  I asked the pt is he has felt this way since surgery or if something changed in the past few weeks. The pt called because his symptoms have worsened over the past few weeks. The pt said he was off his furosemide for 2 weeks and saw his PCP on Friday.  Lasix was restarted and CXR showed no fluid.  The pt's symptoms have started to improve since going back on lasix.  The pt's BP has been 125/90 and 138/75, pulse 72, 85. The pt also contacted the office to find out if he could get disability.  I spoke with the pt about the process of applying for disability and made him aware that he has to start the disability process and we will provide medical records.

## 2014-07-16 NOTE — Telephone Encounter (Signed)
New Message  Pt c/o of CP, SoB, fatigue after walking or anything strenuous. Please call back and discuss.     Pt c/o of Chest Pain: STAT if CP now or developed within 24 hours  1. Are you having CP right now? no  2. Are you experiencing any other symptoms (ex. SOB, nausea, vomiting, sweating)? SoB  3. How long have you been experiencing CP? Last couple weeks  4. Is your CP continuous or coming and going? After exertion  5. Have you taken Nitroglycerin? no ?

## 2014-07-16 NOTE — Telephone Encounter (Signed)
Prior auth for Entresto 24-26 sent to [facility] via Cover My Meds. 

## 2014-07-17 NOTE — Telephone Encounter (Signed)
Left message on machine for pt to contact the office.   

## 2014-07-17 NOTE — Telephone Encounter (Signed)
I will forward this message to Dr Excell Seltzer to review and make further recommendations if needed.

## 2014-07-17 NOTE — Telephone Encounter (Signed)
May be best for Flex/APP visit to evaluate his volume status

## 2014-07-24 NOTE — Telephone Encounter (Signed)
New Prob    Pt is requesting to speak to staff regarding disability and how to get the process started. Please call.

## 2014-07-24 NOTE — Telephone Encounter (Signed)
I spoke with the pt and he followed up with his PCP on 07/19/14 and he is feeling better. At this time the pt does not feel like he needs to be seen in our office for symptoms. The pt also asked again about disability.  The pt states that on Monday his employer sent him home from work because due to the heat they were afraid he would have another heart attack.  At this time the pt is in the process of applying for unemployment.  The pt would like to know what he needs to do to start a disability process.  I advised him that he would have to contact the social services department to start this process.

## 2014-07-31 ENCOUNTER — Telehealth: Payer: Self-pay | Admitting: Cardiovascular Disease

## 2014-07-31 NOTE — Telephone Encounter (Signed)
I spoke with the pt and made him aware that if he is requiring clearance for dental extraction then our office requires the dentist office to fax a formal request for clearance to our office.  I provided the pt with our fax number.

## 2014-07-31 NOTE — Telephone Encounter (Signed)
New Prob     Request for surgical clearance:  1. What type of surgery is being performed? Tooth extraction   2. When is this surgery scheduled? Not yet scheduled   3. Are there any medications that need to be held prior to surgery and how long? Pt is not sure   4. Name of physician performing surgery? Dr. Lowry Ram   5. What is your office phone and fax number? [phone]

## 2014-08-26 ENCOUNTER — Other Ambulatory Visit (HOSPITAL_COMMUNITY): Payer: No Typology Code available for payment source

## 2014-08-26 ENCOUNTER — Other Ambulatory Visit: Payer: Self-pay

## 2014-08-26 ENCOUNTER — Other Ambulatory Visit (INDEPENDENT_AMBULATORY_CARE_PROVIDER_SITE_OTHER): Payer: No Typology Code available for payment source | Admitting: *Deleted

## 2014-08-26 ENCOUNTER — Ambulatory Visit (HOSPITAL_COMMUNITY): Payer: No Typology Code available for payment source | Attending: Internal Medicine

## 2014-08-26 DIAGNOSIS — I2510 Atherosclerotic heart disease of native coronary artery without angina pectoris: Secondary | ICD-10-CM | POA: Diagnosis not present

## 2014-08-26 DIAGNOSIS — I071 Rheumatic tricuspid insufficiency: Secondary | ICD-10-CM | POA: Insufficient documentation

## 2014-08-26 DIAGNOSIS — I517 Cardiomegaly: Secondary | ICD-10-CM | POA: Insufficient documentation

## 2014-08-26 DIAGNOSIS — I5022 Chronic systolic (congestive) heart failure: Secondary | ICD-10-CM

## 2014-08-26 LAB — BRAIN NATRIURETIC PEPTIDE: Pro B Natriuretic peptide (BNP): 354 pg/mL — ABNORMAL HIGH (ref 0.0–100.0)

## 2014-08-26 LAB — BASIC METABOLIC PANEL
BUN: 13 mg/dL (ref 6–23)
CO2: 26 mEq/L (ref 19–32)
Calcium: 9.2 mg/dL (ref 8.4–10.5)
Chloride: 101 mEq/L (ref 96–112)
Creatinine, Ser: 1.08 mg/dL (ref 0.40–1.50)
GFR: 74.51 mL/min (ref >60.00)
Glucose, Bld: 241 mg/dL — ABNORMAL HIGH (ref 70–99)
POTASSIUM: 3.6 meq/L (ref 3.5–5.1)
Sodium: 136 mEq/L (ref 135–145)

## 2014-09-04 ENCOUNTER — Encounter: Payer: Self-pay | Admitting: Cardiovascular Disease

## 2014-09-04 NOTE — Telephone Encounter (Signed)
This encounter was created in error - please disregard.

## 2014-09-04 NOTE — Telephone Encounter (Signed)
Follow Up ° ° ° ° ° ° ° °Pt returning Lauren's phone call. °

## 2014-10-15 NOTE — Progress Notes (Signed)
Cardiology Office Note   Date:  10/16/2014   ID:  [name], [name] 1955-10-14, MRN [id number]  PCP:  [name], MD  Cardiologist:  Dr. Tonny Bollman   Electrophysiologist:  n/a  Chief Complaint  Patient presents with  . Coronary Artery Disease  . Cardiomyopathy  . Congestive Heart Failure     History of Present Illness: Danny [name] is a 59 y.o. male with a hx of long-standing diabetes mellitus, CAD status post CABG in 2/16. He was admitted 03/2014 with acute lateral myocardial infarction. He had evolving ECG changes suspicious for STEMI. Emergent LHC demonstrated diffuse 3 vessel CAD and mildly reduced LV function. Diagonal vessel was felt to possibly be the culprit. He was felt to have completed his infarction and there were no good targets for PCI. Echocardiogram demonstrated EF 50%. He underwent CABG with Dr. Dorris Fetch 03/28/14 (LIMA-LAD, SVG-D1, SVG-OM). At the time of surgery, he was noted to have severe hemorrhagic pericarditis or bloody pericardial effusion  Readmitted to [facility] 3/16 with a/c systolic CHF. Echo was done and demonstrated EF 35-40%.   Last seen by Dr. Excell Seltzer 4/16. Follow-up echo was arranged. This was performed 08/26/14 and demonstrated EF 50%, moderate diastolic dysfunction. He returns for follow-up.  He is here today by himself. His insurance would not cover Entresto. He is now taking losartan. Overall, he is doing well. He continues to note decreased stamina and dyspnea with exertion. He is NYHA 2-2b. He describes chest discomfort with activity described as burning. However, he notes that this was occurring shortly after his surgery and has since resolved. He denies any further chest discomfort. He denies syncope, orthopnea, PND or edema. He did stop his medications several months ago and developed fluid overload. His primary care physician resumed his medications with resolution of his symptoms.   Studies/Reports Reviewed  Today:  Echo 08/26/14 Mild LVH, EF 50%, inferior and lateral HK-AK, Gr 2 DD, mod LAE, mild RVE, reduced RV function, trivial TR, PASP 30 mmHg  Echo at [facility] 04/16/14 Mod L Eff, EF 35-40%, Impaired relaxation, Diff HK - Ant and AS best preserved, Mild LVH, Mild LAE, Mild TR  Carotid US 03/26/14 1-39% internal carotid artery stenosis bilaterally  Echo 04/02/14 EF 50%, mild LVH, inferior and inferolateral HK, mild MR, LA upper limits of normal, normal RV function, mild TR  Cardiac cath 03/23/14 LM: Patent.  LAD: Prox 50%, mid 75-80%. D1 trifurcates into 3 small subbranches. Ostium has 90% stenosis. I LCx: prox 90%, OM1 80% stenosis, OM2 80% stenosis. RCA: PDA 80%  EF: Anterolateral wall is akinetic. EF is 40-45%.    Past Medical History  Diagnosis Date  . Acute MI, lateral wall, initial episode of care 03/23/2014  . Type 2 diabetes mellitus 03/23/2014  . CAD (coronary artery disease)     a. s/p Lat MI 2/16 >> s/p CABG  . Ischemic cardiomyopathy     a. Echo at [facility] 3/16:  EF 35-40%, diff HK, mild LVH, mild LAE, mild TR  . Chronic systolic CHF (congestive heart failure)     Past Surgical History  Procedure Laterality Date  . Left heart catheterization with coronary angiogram N/A 03/23/2014    Procedure: LEFT HEART CATHETERIZATION WITH CORONARY ANGIOGRAM;  Surgeon: Micheline Chapman, MD;  Location: [facility] CATH LAB;  Service: Cardiovascular;  Laterality: N/A;  . Coronary artery bypass graft N/A 03/28/2014    Procedure: CORONARY ARTERY BYPASS GRAFTING (CABG);  Surgeon: Loreli Slot, MD;  Location: [facility] OR;  Service: Open Heart Surgery;  Laterality: N/A;  . Tee without cardioversion N/A 03/28/2014    Procedure: TRANSESOPHAGEAL ECHOCARDIOGRAM (TEE);  Surgeon: Loreli Slot, MD;  Location: [facility] OR;  Service: Open Heart Surgery;  Laterality: N/A;     Current Outpatient Prescriptions  Medication Sig Dispense Refill  . aspirin EC 81 MG tablet Take 81 mg by mouth daily.    Marland Kitchen atorvastatin  (LIPITOR) 80 MG tablet Take 1 tablet (80 mg total) by mouth daily at 6 PM. 30 tablet 11  . carvedilol (COREG) 25 MG tablet Take 1 tablet (25 mg total) by mouth 2 (two) times daily. 60 tablet 11  . clopidogrel (PLAVIX) 75 MG tablet Take 1 tablet (75 mg total) by mouth daily. 30 tablet 11  . furosemide (LASIX) 40 MG tablet Take 1.5 tablets (60 mg total) by mouth 2 (two) times daily. 90 tablet 11  . insulin aspart (NOVOLOG) 100 UNIT/ML injection Inject 100 Units into the skin once.     Marland Kitchen LEVEMIR FLEXTOUCH 100 UNIT/ML Pen     . losartan (COZAAR) 50 MG tablet Take 1 tablet (50 mg total) by mouth daily. 90 tablet 3  . Multiple Vitamin (MULTIVITAMIN) tablet Take 1 tablet by mouth daily.    Marland Kitchen triamcinolone cream (KENALOG) 0.5 % Apply 1 application topically once as needed (nose).     Marland Kitchen VIGAMOX 0.5 % ophthalmic solution Place 1 drop into the right eye 2 (two) times daily as needed (eye infection).     Melene Muller ON 10/30/2014] pravastatin (PRAVACHOL) 40 MG tablet Take 1 tablet (40 mg total) by mouth every evening. 30 tablet 6   No current facility-administered medications for this visit.    Allergies:   Review of patient's allergies indicates no known allergies.    Social History:  The patient  reports that he has never smoked. He does not have any smokeless tobacco history on file.   Family History:  The patient's family history includes Diabetes in his brother; Healthy in his father and mother; Heart attack in his father. There is no history of Stroke.    ROS:   Please see the history of present illness.   Review of Systems  Constitution: Negative for fever.  Respiratory: Negative for cough and wheezing.   Musculoskeletal: Positive for myalgias.  Gastrointestinal: Negative for hematochezia and melena.  Genitourinary: Negative for hematuria.  All other systems reviewed and are negative.     PHYSICAL EXAM: VS:  BP 122/60 mmHg  Pulse 64  Ht 5\' 8"  (1.727 m)  Wt 220 lb (99.791 kg)  BMI 33.46  kg/m2    Wt Readings from Last 3 Encounters:  10/16/14 220 lb (99.791 kg)  05/06/14 196 lb (88.905 kg)  05/23/14 200 lb (90.719 kg)     GEN: Well nourished, well developed, in no acute distress HEENT: normal Neck: no JVD,  no masses Cardiac:  Normal S1/S2, RRR; no murmur ,  no rubs or gallops, no edema   Respiratory:  clear to auscultation bilaterally, no wheezing, rhonchi or rales. GI: soft, nontender, nondistended, + BS MS: no deformity or atrophy Skin: warm and dry  Neuro:  CNs II-XII intact, Strength and sensation are intact Psych: Normal affect   EKG:  EKG is ordered today.  It demonstrates:   NSR, HR 64, rightward axis, IVCD, NSSTTW changes, QTc 435, no change from prior tracing.    Recent Labs: 03/23/2014: B Natriuretic Peptide 556.0*; TSH 0.591 03/29/2014: Magnesium 2.9* 03/30/2014: Hemoglobin 9.9*; Platelets 210 08/26/2014: BUN  13; Creatinine, Ser 1.08; Potassium 3.6; Pro B Natriuretic peptide (BNP) 354.0*; Sodium 136    Lipid Panel    Component Value Date/Time   CHOL 144 03/24/2014 0314   TRIG 81 03/24/2014 0314   HDL 47 03/24/2014 0314   CHOLHDL 3.1 03/24/2014 0314   VLDL 16 03/24/2014 0314   LDLCALC 81 03/24/2014 0314      ASSESSMENT AND PLAN:  Coronary artery disease involving native coronary artery of native heart without angina pectoris:  Status post lateral STEMI in 2/16 with subsequent CABG. He is overall stable without anginal symptoms. Continue aspirin, Plavix, statin, beta blocker, ARB. He does describe decreased stamina and dyspnea of exertion. Question if he has a component of deconditioning related to this. He never did complete cardiac rehabilitation. I will refer him back to cardiac rehabilitation.  Ischemic cardiomyopathy:  Recent echocardiogram with improved LV function. Continue beta blocker, ARB. His insurance would not cover Entresto.  Chronic combined systolic and diastolic congestive heart failure:  Volume appears stable. He does not look to  clear volume overloaded on exam but he does describe dyspnea on exertion. His weight is up 20 pounds since last visit. This is likely related to diet. I will check a BMET, BNP today. If his BNP is significantly elevated, I will adjust his Lasix.  Essential hypertension: Controlled.    Hyperlipidemia:  He describes diffuse myalgias. I will stop his Lipitor 2 weeks. Obtain total CPK today. If he has resolution of myalgias, start pravastatin 40 mg daily at bedtime. If he has no change in his symptoms, resume Lipitor.   Diabetes mellitus type 2, uncontrolled:  Follow-up with primary care.    Medication Changes: Current medicines are reviewed at length with the patient today.  Concerns regarding medicines are as outlined above.  The following changes have been made:   Discontinued Medications   INSULIN NPH HUMAN (HUMULIN N,NOVOLIN N) 100 UNIT/ML INJECTION    Inject 25 Units into the skin 2 (two) times daily.    INSULIN REGULAR (NOVOLIN R,HUMULIN R) 100 UNITS/ML INJECTION    Inject 10 Units into the skin 2 (two) times daily.    SACUBITRIL-VALSARTAN (ENTRESTO) 24-26 MG    Take 1 tablet by mouth 2 (two) times daily.   Modified Medications   No medications on file   New Prescriptions   PRAVASTATIN (PRAVACHOL) 40 MG TABLET    Take 1 tablet (40 mg total) by mouth every evening.    Labs/ tests ordered today include:   Orders Placed This Encounter  Procedures  . Basic Metabolic Panel (BMET)  . B Nat Peptide  . CK Total (and CKMB)  . EKG 12-Lead     Disposition:    FU with Dr. Tonny Bollman 3 mos    Signed, Brynda Rim, MHS 10/16/2014 4:57 PM    [facility] [address] [place], [place], [place]  [address] Phone: [phone]; Fax: [phone]

## 2014-10-16 ENCOUNTER — Ambulatory Visit (INDEPENDENT_AMBULATORY_CARE_PROVIDER_SITE_OTHER): Payer: No Typology Code available for payment source | Admitting: Physician Assistant

## 2014-10-16 ENCOUNTER — Encounter: Payer: Self-pay | Admitting: Physician Assistant

## 2014-10-16 VITALS — BP 122/60 | HR 64 | Ht 68.0 in | Wt 220.0 lb

## 2014-10-16 DIAGNOSIS — E785 Hyperlipidemia, unspecified: Secondary | ICD-10-CM

## 2014-10-16 DIAGNOSIS — M791 Myalgia, unspecified site: Secondary | ICD-10-CM

## 2014-10-16 DIAGNOSIS — I5042 Chronic combined systolic (congestive) and diastolic (congestive) heart failure: Secondary | ICD-10-CM

## 2014-10-16 DIAGNOSIS — IMO0002 Reserved for concepts with insufficient information to code with codable children: Secondary | ICD-10-CM

## 2014-10-16 DIAGNOSIS — I10 Essential (primary) hypertension: Secondary | ICD-10-CM

## 2014-10-16 DIAGNOSIS — I2510 Atherosclerotic heart disease of native coronary artery without angina pectoris: Secondary | ICD-10-CM

## 2014-10-16 DIAGNOSIS — E1165 Type 2 diabetes mellitus with hyperglycemia: Secondary | ICD-10-CM

## 2014-10-16 DIAGNOSIS — I255 Ischemic cardiomyopathy: Secondary | ICD-10-CM | POA: Diagnosis not present

## 2014-10-16 HISTORY — DX: Hyperlipidemia, unspecified: E78.5

## 2014-10-16 MED ORDER — PRAVASTATIN SODIUM 40 MG PO TABS: 40.0000 mg | ORAL_TABLET | Freq: Every evening | ORAL | Status: DC

## 2014-10-16 NOTE — Patient Instructions (Addendum)
Medication Instructions:  Your physician has recommended you make the following change in your medication:  1- Hold Lipitor for two weeks. 2- If muscle pain resolved in two weeks, start Pravastatin 40 mg by mouth at bedtime 3- If muscle pain not resolved in 2 weeks, resume Lipitor.    Labwork: Your physician recommends that you have lab work today BMET, BNP, and Total CK  Testing/Procedures: NONE  Follow-Up: Your physician recommends that you schedule a follow-up appointment in: 3 months with Dr. Copper.  Your physician recommends that you schedule an appointment with Cardiac Rehab. They will call you with an appointment.     Any Other Special Instructions Will Be Listed Below (If Applicable).

## 2014-10-17 ENCOUNTER — Telehealth: Payer: Self-pay | Admitting: Physician Assistant

## 2014-10-17 ENCOUNTER — Other Ambulatory Visit: Payer: Self-pay | Admitting: *Deleted

## 2014-10-17 ENCOUNTER — Telehealth: Payer: Self-pay | Admitting: Cardiovascular Disease

## 2014-10-17 ENCOUNTER — Telehealth: Payer: Self-pay | Admitting: *Deleted

## 2014-10-17 ENCOUNTER — Other Ambulatory Visit (INDEPENDENT_AMBULATORY_CARE_PROVIDER_SITE_OTHER): Payer: No Typology Code available for payment source

## 2014-10-17 DIAGNOSIS — M791 Myalgia, unspecified site: Secondary | ICD-10-CM

## 2014-10-17 LAB — BASIC METABOLIC PANEL
BUN: 19 mg/dL (ref 6–23)
CHLORIDE: 104 meq/L (ref 96–112)
CO2: 30 meq/L (ref 19–32)
Calcium: 9.4 mg/dL (ref 8.4–10.5)
Creatinine, Ser: 1.21 mg/dL (ref 0.40–1.50)
GFR: 65.32 mL/min (ref >60.00)
GLUCOSE: 151 mg/dL — AB (ref 70–99)
POTASSIUM: 4 meq/L (ref 3.5–5.1)
SODIUM: 142 meq/L (ref 135–145)

## 2014-10-17 LAB — CK: Total CK: 157 U/L (ref 7–232)

## 2014-10-17 LAB — BRAIN NATRIURETIC PEPTIDE: Pro B Natriuretic peptide (BNP): 187 pg/mL — ABNORMAL HIGH (ref 0.0–100.0)

## 2014-10-17 NOTE — Telephone Encounter (Signed)
New Message  Pt discussed cardiac rehab w/ [facility] at last OV (8/31). Pt stated he would not want to continue w/ Cardiac rehab due to a financial and time restraint. Pt wanted to relay this information to [employer].

## 2014-10-17 NOTE — Telephone Encounter (Signed)
Pt notified of all lab results by phone today with verbal understanding

## 2014-10-17 NOTE — Telephone Encounter (Signed)
New message      [facility] cannot use blue top for CKMB test

## 2014-10-17 NOTE — Telephone Encounter (Signed)
Okay.  I will cancel referral to Cardiac Rehabilitation.

## 2014-10-17 NOTE — Telephone Encounter (Signed)
I looked into this and found order was placed wrong. This was supposed to be just CK and NO CKMB. I s/w Mellody Dance our lab tech who is getting lab order corrected. I also called [facility] and advised order was in error and cancelled .

## 2014-10-23 ENCOUNTER — Telehealth: Payer: Self-pay | Admitting: Cardiovascular Disease

## 2014-10-23 NOTE — Telephone Encounter (Signed)
New Message   Pt has 2 sets of papers to have Dr. Excell Seltzer fill out for his disability  He will fax over

## 2014-10-23 NOTE — Telephone Encounter (Signed)
Paperwork received via fax on patient from [employer] sent interoffice to [facility] for completion.

## 2014-11-12 ENCOUNTER — Telehealth: Payer: Self-pay | Admitting: Cardiovascular Disease

## 2014-11-12 NOTE — Telephone Encounter (Signed)
Patient's mother (dpr on file) calling to request assistance by Dr. Excell Seltzer for patient to be placed on disability so that he can qualify for SSDI.  As of 10/23/2014, Medical Records noted:   Call Documentation      Danny [name] at 10/23/2014 11:04 AM     Status: Signed        Paperwork received via fax on patient from [employer] sent interoffice to [facility] for completion.        Will route to Medical Records to follow up with patient's mother.

## 2014-11-12 NOTE — Telephone Encounter (Signed)
New message      Calling to see if we can help pt get on disability.  He is getting no income and cannot work

## 2015-02-03 ENCOUNTER — Encounter: Payer: Self-pay | Admitting: Cardiovascular Disease

## 2015-02-03 ENCOUNTER — Ambulatory Visit (INDEPENDENT_AMBULATORY_CARE_PROVIDER_SITE_OTHER): Payer: No Typology Code available for payment source | Admitting: Cardiovascular Disease

## 2015-02-03 VITALS — BP 132/84 | HR 69 | Ht 68.0 in | Wt 230.8 lb

## 2015-02-03 DIAGNOSIS — I25119 Atherosclerotic heart disease of native coronary artery with unspecified angina pectoris: Secondary | ICD-10-CM

## 2015-02-03 MED ORDER — ISOSORBIDE MONONITRATE ER 30 MG PO TB24: 30.0000 mg | ORAL_TABLET | Freq: Every day | ORAL | Status: DC

## 2015-02-03 NOTE — Progress Notes (Signed)
Cardiology Office Note Date:  02/05/2015   ID:  Danny [name], Danny [name] 1955/06/09, MRN [id number]  PCP:  [name], MD  Cardiologist:  Tonny Bollman, MD    Chief Complaint  Patient presents with  . Chest Pain    History of Present Illness: Danny [name] is a 59 y.o. male who presents for follow-up of CAD. He initially presented in February 2016 with an acute lateral wall MI. The patient had a late presentation and at cardiac catheterization he was diagnosed with diffuse three-vessel coronary artery disease. He ultimately underwent coronary bypass surgery with a LIMA to LAD, vein graft to diagonal, and vein graft obtuse marginal. The patient developed postinfarction pericarditis prior to bypass surgery, but had minimal symptoms. At the time of surgery, he was noted to have severe hemorrhagic pericarditis with a bloody pericardial effusion. He was rehospitalized for acute congestive heart failure. He describes symptoms of acute pulmonary edema and noted rapid improvement with BiPAP and diuresis.  The patient complains of fatigue and difficulty with doing physical work. He complains of shortness of breath and chest burning with strenuous activity. 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 If he continues with strenuous work, the pain progresses. Symptoms subside within 5 minutes. He also complains of exertional dyspnea. Describes chest pain as a burning sensation in the center of his chest. Other complaints include orthopnea and leg swelling on an intermittent basis. No presyncope or heart palpitations.    Past Medical History  Diagnosis Date  . Acute MI, lateral wall, initial episode of care (HCC) 03/23/2014  . Type 2 diabetes mellitus (HCC) 03/23/2014  . CAD (coronary artery disease)     a. s/p Lat MI 2/16 >> s/p CABG  . Ischemic cardiomyopathy     a. Echo at [facility] 3/16:  EF 35-40%, diff HK, mild LVH, mild LAE, mild TR  . Chronic systolic CHF (congestive heart  failure) [facility])     Past Surgical History  Procedure Laterality Date  . Left heart catheterization with coronary angiogram N/A 03/23/2014    Procedure: LEFT HEART CATHETERIZATION WITH CORONARY ANGIOGRAM;  Surgeon: Micheline Chapman, MD;  Location: [facility] CATH LAB;  Service: Cardiovascular;  Laterality: N/A;  . Coronary artery bypass graft N/A 03/28/2014    Procedure: CORONARY ARTERY BYPASS GRAFTING (CABG);  Surgeon: Loreli Slot, MD;  Location: [facility] OR;  Service: Open Heart Surgery;  Laterality: N/A;  . Tee without cardioversion N/A 03/28/2014    Procedure: TRANSESOPHAGEAL ECHOCARDIOGRAM (TEE);  Surgeon: Loreli Slot, MD;  Location: [facility] OR;  Service: Open Heart Surgery;  Laterality: N/A;    Current Outpatient Prescriptions  Medication Sig Dispense Refill  . amoxicillin-clavulanate (AUGMENTIN) 875-125 MG tablet Take 1 tablet by mouth 2 (two) times daily.     Marland Kitchen aspirin EC 81 MG tablet Take 81 mg by mouth daily.    Marland Kitchen atorvastatin (LIPITOR) 80 MG tablet Take 1 tablet (80 mg total) by mouth daily at 6 PM. 30 tablet 11  . carvedilol (COREG) 25 MG tablet Take 1 tablet (25 mg total) by mouth 2 (two) times daily. 60 tablet 11  . clopidogrel (PLAVIX) 75 MG tablet Take 1 tablet (75 mg total) by mouth daily. 30 tablet 11  . furosemide (LASIX) 40 MG tablet Take 1.5 tablets (60 mg total) by mouth 2 (two) times daily. 90 tablet 11  . Guaifenesin-Codeine 200-10 MG/5ML LIQD Take by mouth.    . insulin aspart (NOVOLOG) 100 UNIT/ML injection Inject 100 Units  into the skin once.     Marland Kitchen LEVEMIR FLEXTOUCH 100 UNIT/ML Pen     . losartan (COZAAR) 50 MG tablet Take 1 tablet (50 mg total) by mouth daily. 90 tablet 3  . Multiple Vitamin (MULTIVITAMIN) tablet Take 1 tablet by mouth daily.    . pravastatin (PRAVACHOL) 40 MG tablet Take 1 tablet (40 mg total) by mouth every evening. 30 tablet 6  . triamcinolone cream (KENALOG) 0.5 % Apply 1 application topically once as needed (nose).     Marland Kitchen VIGAMOX 0.5 % ophthalmic  solution Place 1 drop into the right eye 2 (two) times daily as needed (eye infection).     . isosorbide mononitrate (IMDUR) 30 MG 24 hr tablet Take 1 tablet (30 mg total) by mouth daily. 90 tablet 3   No current facility-administered medications for this visit.    Allergies:   Review of patient's allergies indicates no known allergies.   Social History:  The patient  reports that he has never smoked. He does not have any smokeless tobacco history on file.   Family History:  The patient's  family history includes Diabetes in his brother; Healthy in his father and mother; Heart attack in his father. There is no history of Stroke.    ROS:  Please see the history of present illness.  Otherwise, review of systems is positive for leg swelling, orthopnea, DOE, leg pain, and balance problems.  All other systems are reviewed and negative.    PHYSICAL EXAM: VS:  BP 132/84 mmHg  Pulse 69  Ht  (1.727 m)  Wt 230 lb 12.8 oz (104.69 kg)  BMI 35.10 kg/m2  SpO2 95% , BMI Body mass index is 35.1 kg/(m^2). GEN: Well nourished, well developed, in no acute distress HEENT: normal Neck: no JVD, no masses. No carotid bruits Cardiac: RRR without murmur or gallop                Respiratory:  clear to auscultation bilaterally, normal work of breathing GI: soft, nontender, nondistended, + BS MS: no deformity or atrophy Ext: no pretibial edema, pedal pulses 2+= bilaterally Skin: warm and dry, no rash Neuro:  Strength and sensation are intact Psych: euthymic mood, full affect  EKG:  EKG is not ordered today.  Recent Labs: 03/23/2014: B Natriuretic Peptide 556.0*; TSH 0.591 03/29/2014: Magnesium 2.9* 03/30/2014: Hemoglobin 9.9*; Platelets 210 10/16/2014: BUN 19; Creatinine, Ser 1.21; Potassium 4.0; Pro B Natriuretic peptide (BNP) 187.0*; Sodium 142   Lipid Panel     Component Value Date/Time   CHOL 144 03/24/2014 0314   TRIG 81 03/24/2014 0314   HDL 47 03/24/2014 0314   CHOLHDL 3.1 03/24/2014 0314    VLDL 16 03/24/2014 0314   LDLCALC 81 03/24/2014 0314      Wt Readings from Last 3 Encounters:  02/03/15 230 lb 12.8 oz (104.69 kg)  10/16/14 220 lb (99.791 kg)  05/06/14 196 lb (88.905 kg)     Cardiac Studies Reviewed: 2D Echo 08/26/2014: Study Conclusions  - Left ventricle: The cavity size was normal. Wall thickness was increased in a pattern of mild LVH. Systolic function was mildly to moderately reduced. The estimated ejection fraction was 50%. Distal inferior and lateral hypokinesis to akinesis. Doppler parameters are consistent with pseudonormal left ventricular relaxation (grade 2 diastolic dysfunction). The E/e&' ratio is >15, suggesting elevated LV filling pressure. - Left atrium: Moderately dilated at 46 ml/m2. - Right ventricle: The cavity size was mildly dilated. Systolic function is reduced. Lateral annulus peak  S velocity: 5.07 cm/s. - Tricuspid valve: There was trivial regurgitation. - Pulmonary arteries: PA peak pressure: 30 mm Hg (S).  Impressions:  - Compared to the prior echo in 03/2014, the LVEF is stable around 50%. There is distal inferior and lateral wall hypokinesis to akinesis.  ASSESSMENT AND PLAN: 1.  CAD, native vessel, with angina CCS Class 2: concerned about recurrent exertional angina s/p CABG. Will check an exercise Myoview for further risk stratification. Medical program reviewed and includes beta-blocker and ARB, statin, and DAPT With ASA and plavix. Add Imdur 30 mg.   2. Chronic combined systolic and diastolic heart failure, NYHA 2: continue with current medical program as outlined. Mild LV dysfunction by echo assessment.  3. Hyperlipidemia: treated with high-intensity statin drug  4. HTN, essential: controlled on current Rx.  Current medicines are reviewed with the patient today.  The patient does not have concerns regarding medicines.  Labs/ tests ordered today include:   Orders Placed This Encounter  Procedures    . Myocardial Perfusion Imaging    Disposition:   FU 6 weeks with Tereso Newcomer, PA-C.   Enzo Bi, MD  02/05/2015 1:31 AM    [facility] [address] [place], [place], [place]  [address] Phone: [phone]; Fax: [phone]

## 2015-02-03 NOTE — Patient Instructions (Signed)
Medication Instructions:  Your physician has recommended you make the following change in your medication:  1. START Isosorbide MN 30mg  take one tablet by mouth daily  Labwork: No new orders.   Testing/Procedures: Your physician has requested that you have an exercise stress myoview. For further information please visit [email or web address] Please follow instruction sheet, as given.  Follow-Up: Your physician recommends that you schedule a follow-up appointment in: 6 WEEKS with Tereso Newcomer PA-C    Any Other Special Instructions Will Be Listed Below (If Applicable).     If you need a refill on your cardiac medications before your next appointment, please call your pharmacy.

## 2015-02-11 ENCOUNTER — Telehealth (HOSPITAL_COMMUNITY): Payer: Self-pay | Admitting: *Deleted

## 2015-02-11 NOTE — Telephone Encounter (Signed)
Left message on voicemail per DPR in reference to upcoming appointment scheduled on 02/13/15 at 0945 with detailed instructions given per Myocardial Perfusion Study Information Sheet for the test. LM to arrive 15 minutes early, and that it is imperative to arrive on time for appointment to keep from having the test rescheduled. If you need to cancel or reschedule your appointment, please call the office within 24 hours of your appointment. Failure to do so may result in a cancellation of your appointment, and a $50 no show fee. Phone number given for call back for any questions. Teegan Brandis, Adelene Idler

## 2015-02-13 ENCOUNTER — Ambulatory Visit (HOSPITAL_COMMUNITY): Payer: No Typology Code available for payment source | Attending: Cardiovascular Disease

## 2015-02-13 ENCOUNTER — Other Ambulatory Visit: Payer: Self-pay

## 2015-02-13 ENCOUNTER — Other Ambulatory Visit: Payer: Self-pay | Admitting: Cardiovascular Disease

## 2015-02-13 DIAGNOSIS — R5383 Other fatigue: Secondary | ICD-10-CM | POA: Insufficient documentation

## 2015-02-13 DIAGNOSIS — I208 Other forms of angina pectoris: Secondary | ICD-10-CM

## 2015-02-13 DIAGNOSIS — R0789 Other chest pain: Secondary | ICD-10-CM | POA: Diagnosis not present

## 2015-02-13 DIAGNOSIS — R0609 Other forms of dyspnea: Secondary | ICD-10-CM | POA: Insufficient documentation

## 2015-02-13 DIAGNOSIS — I25119 Atherosclerotic heart disease of native coronary artery with unspecified angina pectoris: Secondary | ICD-10-CM

## 2015-02-13 DIAGNOSIS — R9439 Abnormal result of other cardiovascular function study: Secondary | ICD-10-CM | POA: Diagnosis not present

## 2015-02-13 DIAGNOSIS — I10 Essential (primary) hypertension: Secondary | ICD-10-CM | POA: Insufficient documentation

## 2015-02-13 DIAGNOSIS — E119 Type 2 diabetes mellitus without complications: Secondary | ICD-10-CM | POA: Diagnosis not present

## 2015-02-13 LAB — MYOCARDIAL PERFUSION IMAGING
CHL CUP NUCLEAR SDS: 9
CHL CUP NUCLEAR SSS: 24
LHR: 0.3
LV sys vol: 147 mL
LVDIAVOL: 212 mL
Peak HR: 93 {beats}/min
Rest HR: 67 {beats}/min
SRS: 15
TID: 1.05

## 2015-02-13 MED ORDER — ISOSORBIDE MONONITRATE ER 30 MG PO TB24: 30.0000 mg | ORAL_TABLET | Freq: Every day | ORAL | Status: DC

## 2015-02-13 MED ORDER — TECHNETIUM TC 99M SESTAMIBI GENERIC - CARDIOLITE
32.9000 | Freq: Once | INTRAVENOUS | Status: AC | PRN
  Administered 2015-02-13: 32.9 via INTRAVENOUS

## 2015-02-13 MED ORDER — REGADENOSON 0.4 MG/5ML IV SOLN
0.4000 mg | Freq: Once | INTRAVENOUS | Status: AC
  Administered 2015-02-13: 0.4 mg via INTRAVENOUS

## 2015-02-13 MED ORDER — ISOSORBIDE MONONITRATE ER 60 MG PO TB24: 60.0000 mg | ORAL_TABLET | Freq: Every day | ORAL | Status: DC

## 2015-02-13 MED ORDER — TECHNETIUM TC 99M SESTAMIBI GENERIC - CARDIOLITE
11.0000 | Freq: Once | INTRAVENOUS | Status: AC | PRN
  Administered 2015-02-13: 11 via INTRAVENOUS

## 2015-02-24 ENCOUNTER — Encounter (HOSPITAL_COMMUNITY)
Admission: RE | Disposition: A | Discharge status: Home / Self Care | Payer: Self-pay | Source: Ambulatory Visit | Attending: Cardiovascular Disease

## 2015-02-24 ENCOUNTER — Ambulatory Visit (HOSPITAL_COMMUNITY)
Admission: RE | Admit: 2015-02-24 | Discharge: 2015-02-25 | Disposition: A | Discharge status: Home / Self Care | Payer: BLUE CROSS/BLUE SHIELD | Source: Ambulatory Visit | Attending: Cardiovascular Disease | Admitting: Cardiovascular Disease

## 2015-02-24 ENCOUNTER — Encounter (HOSPITAL_COMMUNITY): Payer: Self-pay | Admitting: *Deleted

## 2015-02-24 DIAGNOSIS — Z23 Encounter for immunization: Secondary | ICD-10-CM | POA: Insufficient documentation

## 2015-02-24 DIAGNOSIS — Z8249 Family history of ischemic heart disease and other diseases of the circulatory system: Secondary | ICD-10-CM | POA: Insufficient documentation

## 2015-02-24 DIAGNOSIS — I25718 Atherosclerosis of autologous vein coronary artery bypass graft(s) with other forms of angina pectoris: Secondary | ICD-10-CM | POA: Insufficient documentation

## 2015-02-24 DIAGNOSIS — I209 Angina pectoris, unspecified: Secondary | ICD-10-CM | POA: Insufficient documentation

## 2015-02-24 DIAGNOSIS — E109 Type 1 diabetes mellitus without complications: Secondary | ICD-10-CM | POA: Diagnosis not present

## 2015-02-24 DIAGNOSIS — Z7982 Long term (current) use of aspirin: Secondary | ICD-10-CM | POA: Diagnosis not present

## 2015-02-24 DIAGNOSIS — I110 Hypertensive heart disease with heart failure: Secondary | ICD-10-CM | POA: Insufficient documentation

## 2015-02-24 DIAGNOSIS — I5042 Chronic combined systolic (congestive) and diastolic (congestive) heart failure: Secondary | ICD-10-CM | POA: Insufficient documentation

## 2015-02-24 DIAGNOSIS — I25118 Atherosclerotic heart disease of native coronary artery with other forms of angina pectoris: Secondary | ICD-10-CM | POA: Diagnosis not present

## 2015-02-24 DIAGNOSIS — Z7902 Long term (current) use of antithrombotics/antiplatelets: Secondary | ICD-10-CM | POA: Diagnosis not present

## 2015-02-24 DIAGNOSIS — Z9861 Coronary angioplasty status: Secondary | ICD-10-CM

## 2015-02-24 DIAGNOSIS — I252 Old myocardial infarction: Secondary | ICD-10-CM | POA: Diagnosis not present

## 2015-02-24 DIAGNOSIS — I25710 Atherosclerosis of autologous vein coronary artery bypass graft(s) with unstable angina pectoris: Secondary | ICD-10-CM | POA: Diagnosis present

## 2015-02-24 DIAGNOSIS — E785 Hyperlipidemia, unspecified: Secondary | ICD-10-CM | POA: Insufficient documentation

## 2015-02-24 DIAGNOSIS — I255 Ischemic cardiomyopathy: Secondary | ICD-10-CM | POA: Insufficient documentation

## 2015-02-24 DIAGNOSIS — I2582 Chronic total occlusion of coronary artery: Secondary | ICD-10-CM | POA: Insufficient documentation

## 2015-02-24 DIAGNOSIS — I208 Other forms of angina pectoris: Secondary | ICD-10-CM | POA: Insufficient documentation

## 2015-02-24 HISTORY — PX: CARDIAC CATHETERIZATION: SHX172

## 2015-02-24 HISTORY — PX: PERCUTANEOUS CORONARY STENT INTERVENTION (PCI-S): SHX6016

## 2015-02-24 LAB — GLUCOSE, CAPILLARY
GLUCOSE-CAPILLARY: 166 mg/dL — AB (ref 65–99)
Glucose-Capillary: 245 mg/dL — ABNORMAL HIGH (ref 65–99)
Glucose-Capillary: 223 mg/dL — ABNORMAL HIGH (ref 65–99)
Glucose-Capillary: 170 mg/dL — ABNORMAL HIGH (ref 65–99)
Glucose-Capillary: 128 mg/dL — ABNORMAL HIGH (ref 65–99)

## 2015-02-24 LAB — BASIC METABOLIC PANEL
ANION GAP: 9 (ref 5–15)
BUN: 10 mg/dL (ref 6–20)
CALCIUM: 9 mg/dL (ref 8.9–10.3)
CHLORIDE: 108 mmol/L (ref 101–111)
CO2: 23 mmol/L (ref 22–32)
Creatinine, Ser: 1 mg/dL (ref 0.61–1.24)
GFR calc non Af Amer: >60 mL/min (ref >60)
GLUCOSE: 260 mg/dL — AB (ref 65–99)
POTASSIUM: 4.7 mmol/L (ref 3.5–5.1)
Sodium: 140 mmol/L (ref 135–145)

## 2015-02-24 LAB — CBC
HEMATOCRIT: 44.1 % (ref 39.0–52.0)
HEMOGLOBIN: 15.5 g/dL (ref 13.0–17.0)
MCH: 30.4 pg (ref 26.0–34.0)
MCHC: 35.1 g/dL (ref 30.0–36.0)
MCV: 86.5 fL (ref 78.0–100.0)
Platelets: 254 10*3/uL (ref 150–400)
RBC: 5.1 MIL/uL (ref 4.22–5.81)
RDW: 12.7 % (ref 11.5–15.5)
WBC: 5.8 10*3/uL (ref 4.0–10.5)

## 2015-02-24 LAB — POCT ACTIVATED CLOTTING TIME: ACTIVATED CLOTTING TIME: 286 s

## 2015-02-24 LAB — PROTIME-INR
INR: 1.01 (ref 0.00–1.49)
PROTHROMBIN TIME: 13.5 s (ref 11.6–15.2)

## 2015-02-24 SURGERY — LEFT HEART CATH AND CORS/GRAFTS ANGIOGRAPHY
Anesthesia: LOCAL

## 2015-02-24 MED ORDER — SODIUM CHLORIDE 0.9 % IJ SOLN: 3.0000 mL | INTRAMUSCULAR | Status: DC | PRN

## 2015-02-24 MED ORDER — INSULIN PUMP
Freq: Three times a day (TID) | SUBCUTANEOUS | Status: DC
  Administered 2015-02-24: 5.7 via SUBCUTANEOUS
  Administered 2015-02-24: 3.2 via SUBCUTANEOUS
  Administered 2015-02-25: 5.2 via SUBCUTANEOUS
  Filled 2015-02-24: qty 1

## 2015-02-24 MED ORDER — INFLUENZA VAC SPLIT QUAD 0.5 ML IM SUSY
0.5000 mL | PREFILLED_SYRINGE | INTRAMUSCULAR | Status: AC
  Administered 2015-02-25: 0.5 mL via INTRAMUSCULAR
  Filled 2015-02-24: qty 0.5

## 2015-02-24 MED ORDER — ASPIRIN 81 MG PO CHEW: 81.0000 mg | CHEWABLE_TABLET | ORAL | Status: DC

## 2015-02-24 MED ORDER — FENTANYL CITRATE (PF) 100 MCG/2ML IJ SOLN
INTRAMUSCULAR | Status: AC
  Filled 2015-02-24: qty 2

## 2015-02-24 MED ORDER — ISOSORBIDE MONONITRATE ER 30 MG PO TB24
30.0000 mg | ORAL_TABLET | Freq: Every day | ORAL | Status: DC
  Administered 2015-02-24 – 2015-02-25 (×2): 30 mg via ORAL
  Filled 2015-02-24 (×2): qty 1

## 2015-02-24 MED ORDER — HEPARIN (PORCINE) IN NACL 2-0.9 UNIT/ML-% IJ SOLN
INTRAMUSCULAR | Status: DC | PRN
  Administered 2015-02-24: 10 mL via INTRA_ARTERIAL

## 2015-02-24 MED ORDER — NITROGLYCERIN 1 MG/10 ML FOR IR/CATH LAB
INTRA_ARTERIAL | Status: AC
  Filled 2015-02-24: qty 10

## 2015-02-24 MED ORDER — SODIUM CHLORIDE 0.9 % IV SOLN
INTRAVENOUS | Status: AC
Start: 2015-02-24 — End: 2015-02-24

## 2015-02-24 MED ORDER — ONDANSETRON HCL 4 MG/2ML IJ SOLN: 4.0000 mg | Freq: Four times a day (QID) | INTRAMUSCULAR | Status: DC | PRN

## 2015-02-24 MED ORDER — HEPARIN SODIUM (PORCINE) 1000 UNIT/ML IJ SOLN
INTRAMUSCULAR | Status: DC | PRN
  Administered 2015-02-24 (×2): 5000 [IU] via INTRAVENOUS

## 2015-02-24 MED ORDER — MIDAZOLAM HCL 2 MG/2ML IJ SOLN
INTRAMUSCULAR | Status: AC
  Filled 2015-02-24: qty 2

## 2015-02-24 MED ORDER — FENTANYL CITRATE (PF) 100 MCG/2ML IJ SOLN
INTRAMUSCULAR | Status: DC | PRN
  Administered 2015-02-24 (×2): 25 ug via INTRAVENOUS

## 2015-02-24 MED ORDER — LIDOCAINE HCL (PF) 1 % IJ SOLN
INTRAMUSCULAR | Status: DC | PRN
  Administered 2015-02-24: 3 mL

## 2015-02-24 MED ORDER — VERAPAMIL HCL 2.5 MG/ML IV SOLN
INTRAVENOUS | Status: AC
  Filled 2015-02-24: qty 2

## 2015-02-24 MED ORDER — LIDOCAINE HCL (PF) 1 % IJ SOLN
INTRAMUSCULAR | Status: AC
  Filled 2015-02-24: qty 30

## 2015-02-24 MED ORDER — ACETAMINOPHEN 325 MG PO TABS
650.0000 mg | ORAL_TABLET | ORAL | Status: DC | PRN
  Administered 2015-02-25: 650 mg via ORAL
  Filled 2015-02-24: qty 2

## 2015-02-24 MED ORDER — CARVEDILOL 12.5 MG PO TABS
25.0000 mg | ORAL_TABLET | Freq: Two times a day (BID) | ORAL | Status: DC
  Administered 2015-02-24 – 2015-02-25 (×2): 25 mg via ORAL
  Filled 2015-02-24 (×2): qty 2

## 2015-02-24 MED ORDER — SODIUM CHLORIDE 0.9 % IV SOLN: 250.0000 mL | INTRAVENOUS | Status: DC | PRN

## 2015-02-24 MED ORDER — SODIUM CHLORIDE 0.9 % IJ SOLN: 3.0000 mL | Freq: Two times a day (BID) | INTRAMUSCULAR | Status: DC

## 2015-02-24 MED ORDER — OXYCODONE-ACETAMINOPHEN 5-325 MG PO TABS: 1.0000 | ORAL_TABLET | ORAL | Status: DC | PRN

## 2015-02-24 MED ORDER — NITROGLYCERIN 1 MG/10 ML FOR IR/CATH LAB
INTRA_ARTERIAL | Status: DC | PRN
  Administered 2015-02-24: 200 ug via INTRACORONARY

## 2015-02-24 MED ORDER — CLOPIDOGREL BISULFATE 75 MG PO TABS
75.0000 mg | ORAL_TABLET | Freq: Every day | ORAL | Status: DC
Start: 2015-02-25 — End: 2015-02-24

## 2015-02-24 MED ORDER — TICAGRELOR 90 MG PO TABS
90.0000 mg | ORAL_TABLET | Freq: Two times a day (BID) | ORAL | Status: DC
  Administered 2015-02-25: 09:00:00 90 mg via ORAL
  Filled 2015-02-24: qty 1

## 2015-02-24 MED ORDER — ANGIOPLASTY BOOK
Freq: Once | Status: DC
  Filled 2015-02-24: qty 1

## 2015-02-24 MED ORDER — ASPIRIN EC 81 MG PO TBEC
81.0000 mg | DELAYED_RELEASE_TABLET | Freq: Every day | ORAL | Status: DC
Start: 2015-02-25 — End: 2015-02-25
  Administered 2015-02-25: 81 mg via ORAL
  Filled 2015-02-24: qty 1

## 2015-02-24 MED ORDER — IOHEXOL 350 MG/ML SOLN
INTRAVENOUS | Status: DC | PRN
  Administered 2015-02-24: 135 mL via INTRA_ARTERIAL

## 2015-02-24 MED ORDER — LOSARTAN POTASSIUM 50 MG PO TABS
50.0000 mg | ORAL_TABLET | Freq: Every day | ORAL | Status: DC
  Administered 2015-02-25: 09:00:00 50 mg via ORAL
  Filled 2015-02-24: qty 1

## 2015-02-24 MED ORDER — FUROSEMIDE 40 MG PO TABS
60.0000 mg | ORAL_TABLET | Freq: Two times a day (BID) | ORAL | Status: DC
  Administered 2015-02-24 – 2015-02-25 (×2): 60 mg via ORAL
  Filled 2015-02-24 (×2): qty 1

## 2015-02-24 MED ORDER — MIDAZOLAM HCL 2 MG/2ML IJ SOLN
INTRAMUSCULAR | Status: DC | PRN
  Administered 2015-02-24: 2 mg via INTRAVENOUS
  Administered 2015-02-24: 1 mg via INTRAVENOUS

## 2015-02-24 MED ORDER — TICAGRELOR 90 MG PO TABS
180.0000 mg | ORAL_TABLET | Freq: Once | ORAL | Status: AC
  Administered 2015-02-24: 180 mg via ORAL
  Filled 2015-02-24: qty 2

## 2015-02-24 MED ORDER — SODIUM CHLORIDE 0.9 % IV SOLN
INTRAVENOUS | Status: DC
  Administered 2015-02-24: 09:00:00 via INTRAVENOUS

## 2015-02-24 MED ORDER — HEPARIN (PORCINE) IN NACL 2-0.9 UNIT/ML-% IJ SOLN
INTRAMUSCULAR | Status: DC | PRN
  Administered 2015-02-24: 13:00:00

## 2015-02-24 MED ORDER — ATORVASTATIN CALCIUM 80 MG PO TABS
80.0000 mg | ORAL_TABLET | Freq: Every day | ORAL | Status: DC
  Administered 2015-02-24: 80 mg via ORAL
  Filled 2015-02-24: qty 1

## 2015-02-24 MED ORDER — HEPARIN (PORCINE) IN NACL 2-0.9 UNIT/ML-% IJ SOLN
INTRAMUSCULAR | Status: AC
  Filled 2015-02-24: qty 500

## 2015-02-24 MED ORDER — HEPARIN SODIUM (PORCINE) 1000 UNIT/ML IJ SOLN
INTRAMUSCULAR | Status: AC
  Filled 2015-02-24: qty 1

## 2015-02-24 SURGICAL SUPPLY — 20 items
BALLN EMERGE MR 2.0X12 (BALLOONS) ×2
BALLN ~~LOC~~ EMERGE MR 2.5X8 (BALLOONS) ×2
BALLOON EMERGE MR 2.0X12 (BALLOONS) ×1 IMPLANT
BALLOON ~~LOC~~ EMERGE MR 2.5X8 (BALLOONS) ×1 IMPLANT
CATH INFINITI 5 FR IM (CATHETERS) ×2 IMPLANT
CATH INFINITI 5FR AL1 (CATHETERS) ×2 IMPLANT
CATH INFINITI 5FR ANG PIGTAIL (CATHETERS) ×2 IMPLANT
CATH INFINITI 5FR JL4 (CATHETERS) ×2 IMPLANT
CATH INFINITI JR4 5F (CATHETERS) ×2 IMPLANT
CATH VISTA GUIDE 6FR JR4 (CATHETERS) ×2 IMPLANT
DEVICE RAD COMP TR BAND LRG (VASCULAR PRODUCTS) ×2 IMPLANT
GLIDESHEATH SLEND SS 6F .021 (SHEATH) ×2 IMPLANT
KIT ENCORE 26 ADVANTAGE (KITS) ×2 IMPLANT
KIT HEART LEFT (KITS) ×2 IMPLANT
PACK CARDIAC CATHETERIZATION (CUSTOM PROCEDURE TRAY) ×2 IMPLANT
STENT PROMUS PREM MR 2.5X12 (Permanent Stent) ×2 IMPLANT
TRANSDUCER W/STOPCOCK (MISCELLANEOUS) ×2 IMPLANT
TUBING CIL FLEX 10 FLL-RA (TUBING) ×2 IMPLANT
WIRE COUGAR XT STRL 190CM (WIRE) ×2 IMPLANT
WIRE SAFE-T 1.5MM-J .035X260CM (WIRE) ×2 IMPLANT

## 2015-02-24 NOTE — Progress Notes (Signed)
Inpatient Diabetes Program Recommendations  AACE/ADA: New Consensus Statement on Inpatient Glycemic Control (2015)  Target Ranges:  Prepandial:   less than 140 mg/dL      Peak postprandial:   less than 180 mg/dL (1-2 hours)      Critically ill patients:  140 - 180 mg/dL    Results for [name], [name] (MRN [id number]) as of 02/24/2015 14:22  Ref. Range 02/24/2015 07:51 02/24/2015 10:16 02/24/2015 13:05  Glucose-Capillary Latest Ref Range: 65-99 mg/dL 245 (H) 223 (H) 170 (H)     Admit with: Cardiac Cath  History: DM Type 1 per MD notes  Home DM Meds: Insulin Pump  Current Insulin Orders: Insulin Pump     -Met with patient this afternoon to review insulin pump settings.  -Patient A&O x3 and able to independently manage insulin pump.  Dr. Burt Knack placed orders allowing patient to use insulin pump in hospital.  Correct Insulin Pump order set placed today.  -Patient does not have extra pump supplies at bedside but wife can bring extra if needed.  Per patient, patient is likely to d/c home in the AM and will not need to change his insulin pump site until he goes home.  Reviewed pump policy with patient and asked him to please allow RN to check his CBGs with hospital meter and also asked him to please let RN know how much insulin he boluses himself with every time he uses pump to bolus insulin.  Pump settings are as follows-  Basal Rates: 12am- 3.2 units/hour 4am- 3.4 units/hour 9am- 3.0 units/hour  Total Basal Insulin= 74.8 units per 24 hour period  Carbohydrate Ratio: 12am- 1 unit for every 7 grams CHO 2pm- 1 unit for every 6 grams CHO  Correction/Sensitivity Factor:  1 unit for every 27 mg/dl above target CBG  Target CBG= 110-126 mg/dl    --Will follow patient during hospitalization--  Wyn Quaker RN, MSN, CDE Diabetes Coordinator Inpatient Glycemic Control Team Team Pager: [phone] (8a-5p)

## 2015-02-24 NOTE — Progress Notes (Signed)
Inpatient Diabetes Program Recommendations  AACE/ADA: New Consensus Statement on Inpatient Glycemic Control (2015)  Target Ranges:  Prepandial:   less than 140 mg/dL      Peak postprandial:   less than 180 mg/dL (1-2 hours)      Critically ill patients:  140 - 180 mg/dL   Call received from short-stay regarding patient with insulin pump and history of type 1 diabetes.  Pt. States he was told to take insulin pump off.  Current blood sugar=223 mg/dL.  Recommended to RN that patient leave insulin pump on until taken to cath lab (he may remove upon arrival to procedure room).  Also may benefit from giving 1/2 of Novolog correction per insulin pump.  Encouraged her to call Dr. Excell Seltzer to make sure this is ok prior to procedure.  Insulin pump should not be off longer than 1 hour.  Therefore it needs to be reapplied as soon as procedure is complete.  Thanks, Beryl Meager, RN, BC-ADM Inpatient Diabetes Coordinator Pager [phone] (8a-5p)

## 2015-02-24 NOTE — Progress Notes (Addendum)
TR BAND REMOVAL  LOCATION:    Left  radial  DEFLATED PER PROTOCOL:    Yes.    TIME BAND OFF / DRESSING APPLIED:    1645    SITE UPON ARRIVAL:    Level 0  SITE AFTER BAND REMOVAL:    Level 0  CIRCULATION SENSATION AND MOVEMENT:    Within Normal Limits   Yes.    COMMENTS:   Post radial cath instructions reviewed with patient, Meds caught up, pt denies complaints.  Call light in reach.  Pt states he feels "great".

## 2015-02-24 NOTE — H&P (View-Only) (Signed)
 Cardiology Office Note Date:  02/05/2015   ID:  Usbaldo Clark Haupert, DOB 05/21/1955, MRN [id number]  PCP:  BARRITT, ALFRED S, MD  Cardiologist:  Azie Mcconahy, MD    Chief Complaint  Patient presents with  . Chest Pain    History of Present Illness: Danny [name] is a 60 y.o. male who presents for follow-up of CAD. He initially presented in February 2016 with an acute lateral wall MI. The patient had a late presentation and at cardiac catheterization he was diagnosed with diffuse three-vessel coronary artery disease. He ultimately underwent coronary bypass surgery with a LIMA to LAD, vein graft to diagonal, and vein graft obtuse marginal. The patient developed postinfarction pericarditis prior to bypass surgery, but had minimal symptoms. At the time of surgery, he was noted to have severe hemorrhagic pericarditis with a bloody pericardial effusion. He was rehospitalized for acute congestive heart failure. He describes symptoms of acute pulmonary edema and noted rapid improvement with BiPAP and diuresis.  The patient complains of fatigue and difficulty with doing physical work. He complains of shortness of breath and chest burning with strenuous activity. 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 If he continues with strenuous work, the pain progresses. Symptoms subside within 5 minutes. He also complains of exertional dyspnea. Describes chest pain as a burning sensation in the center of his chest. Other complaints include orthopnea and leg swelling on an intermittent basis. No presyncope or heart palpitations.    Past Medical History  Diagnosis Date  . Acute MI, lateral wall, initial episode of care (HCC) 03/23/2014  . Type 2 diabetes mellitus (HCC) 03/23/2014  . CAD (coronary artery disease)     a. s/p Lat MI 2/16 >> s/p CABG  . Ischemic cardiomyopathy     a. Echo at [facility] 3/16:  EF 35-40%, diff HK, mild LVH, mild LAE, mild TR  . Chronic systolic CHF (congestive heart  failure) (HCC)     Past Surgical History  Procedure Laterality Date  . Left heart catheterization with coronary angiogram N/A 03/23/2014    Procedure: LEFT HEART CATHETERIZATION WITH CORONARY ANGIOGRAM;  Surgeon: Opie Maclaughlin D Daizy Outen, MD;  Location: [facility] CATH LAB;  Service: Cardiovascular;  Laterality: N/A;  . Coronary artery bypass graft N/A 03/28/2014    Procedure: CORONARY ARTERY BYPASS GRAFTING (CABG);  Surgeon: Steven C Hendrickson, MD;  Location: [facility] OR;  Service: Open Heart Surgery;  Laterality: N/A;  . Tee without cardioversion N/A 03/28/2014    Procedure: TRANSESOPHAGEAL ECHOCARDIOGRAM (TEE);  Surgeon: Steven C Hendrickson, MD;  Location: [facility] OR;  Service: Open Heart Surgery;  Laterality: N/A;    Current Outpatient Prescriptions  Medication Sig Dispense Refill  . amoxicillin-clavulanate (AUGMENTIN) 875-125 MG tablet Take 1 tablet by mouth 2 (two) times daily.     . aspirin EC 81 MG tablet Take 81 mg by mouth daily.    . atorvastatin (LIPITOR) 80 MG tablet Take 1 tablet (80 mg total) by mouth daily at 6 PM. 30 tablet 11  . carvedilol (COREG) 25 MG tablet Take 1 tablet (25 mg total) by mouth 2 (two) times daily. 60 tablet 11  . clopidogrel (PLAVIX) 75 MG tablet Take 1 tablet (75 mg total) by mouth daily. 30 tablet 11  . furosemide (LASIX) 40 MG tablet Take 1.5 tablets (60 mg total) by mouth 2 (two) times daily. 90 tablet 11  . Guaifenesin-Codeine 200-10 MG/5ML LIQD Take by mouth.    . insulin aspart (NOVOLOG) 100 UNIT/ML injection Inject 100 Units   into the skin once.     Marland Kitchen LEVEMIR FLEXTOUCH 100 UNIT/ML Pen     . losartan (COZAAR) 50 MG tablet Take 1 tablet (50 mg total) by mouth daily. 90 tablet 3  . Multiple Vitamin (MULTIVITAMIN) tablet Take 1 tablet by mouth daily.    . pravastatin (PRAVACHOL) 40 MG tablet Take 1 tablet (40 mg total) by mouth every evening. 30 tablet 6  . triamcinolone cream (KENALOG) 0.5 % Apply 1 application topically once as needed (nose).     Marland Kitchen VIGAMOX 0.5 % ophthalmic  solution Place 1 drop into the right eye 2 (two) times daily as needed (eye infection).     . isosorbide mononitrate (IMDUR) 30 MG 24 hr tablet Take 1 tablet (30 mg total) by mouth daily. 90 tablet 3   No current facility-administered medications for this visit.    Allergies:   Review of patient's allergies indicates no known allergies.   Social History:  The patient  reports that he has never smoked. He does not have any smokeless tobacco history on file.   Family History:  The patient's  family history includes Diabetes in his brother; Healthy in his father and mother; Heart attack in his father. There is no history of Stroke.    ROS:  Please see the history of present illness.  Otherwise, review of systems is positive for leg swelling, orthopnea, DOE, leg pain, and balance problems.  All other systems are reviewed and negative.    PHYSICAL EXAM: VS:  BP 132/84 mmHg  Pulse 69  Ht  (1.727 m)  Wt 230 lb 12.8 oz (104.69 kg)  BMI 35.10 kg/m2  SpO2 95% , BMI Body mass index is 35.1 kg/(m^2). GEN: Well nourished, well developed, in no acute distress HEENT: normal Neck: no JVD, no masses. No carotid bruits Cardiac: RRR without murmur or gallop                Respiratory:  clear to auscultation bilaterally, normal work of breathing GI: soft, nontender, nondistended, + BS MS: no deformity or atrophy Ext: no pretibial edema, pedal pulses 2+= bilaterally Skin: warm and dry, no rash Neuro:  Strength and sensation are intact Psych: euthymic mood, full affect  EKG:  EKG is not ordered today.  Recent Labs: 03/23/2014: B Natriuretic Peptide 556.0*; TSH 0.591 03/29/2014: Magnesium 2.9* 03/30/2014: Hemoglobin 9.9*; Platelets 210 10/16/2014: BUN 19; Creatinine, Ser 1.21; Potassium 4.0; Pro B Natriuretic peptide (BNP) 187.0*; Sodium 142   Lipid Panel     Component Value Date/Time   CHOL 144 03/24/2014 0314   TRIG 81 03/24/2014 0314   HDL 47 03/24/2014 0314   CHOLHDL 3.1 03/24/2014 0314    VLDL 16 03/24/2014 0314   LDLCALC 81 03/24/2014 0314      Wt Readings from Last 3 Encounters:  02/03/15 230 lb 12.8 oz (104.69 kg)  10/16/14 220 lb (99.791 kg)  05/06/14 196 lb (88.905 kg)     Cardiac Studies Reviewed: 2D Echo 08/26/2014: Study Conclusions  - Left ventricle: The cavity size was normal. Wall thickness was increased in a pattern of mild LVH. Systolic function was mildly to moderately reduced. The estimated ejection fraction was 50%. Distal inferior and lateral hypokinesis to akinesis. Doppler parameters are consistent with pseudonormal left ventricular relaxation (grade 2 diastolic dysfunction). The E/e&' ratio is >15, suggesting elevated LV filling pressure. - Left atrium: Moderately dilated at 46 ml/m2. - Right ventricle: The cavity size was mildly dilated. Systolic function is reduced. Lateral annulus peak  S velocity: 5.07 cm/s. - Tricuspid valve: There was trivial regurgitation. - Pulmonary arteries: PA peak pressure: 30 mm Hg (S).  Impressions:  - Compared to the prior echo in 03/2014, the LVEF is stable around 50%. There is distal inferior and lateral wall hypokinesis to akinesis.  ASSESSMENT AND PLAN: 1.  CAD, native vessel, with angina CCS Class 2: concerned about recurrent exertional angina s/p CABG. Will check an exercise Myoview for further risk stratification. Medical program reviewed and includes beta-blocker and ARB, statin, and DAPT With ASA and plavix. Add Imdur 30 mg.   2. Chronic combined systolic and diastolic heart failure, NYHA 2: continue with current medical program as outlined. Mild LV dysfunction by echo assessment.  3. Hyperlipidemia: treated with high-intensity statin drug  4. HTN, essential: controlled on current Rx.  Current medicines are reviewed with the patient today.  The patient does not have concerns regarding medicines.  Labs/ tests ordered today include:   Orders Placed This Encounter  Procedures    . Myocardial Perfusion Imaging    Disposition:   FU 6 weeks with Tereso Newcomer, PA-C.   Enzo Bi, MD  02/05/2015 1:31 AM    [facility] [address] [place], [place], [place]  [address] Phone: [phone]; Fax: [phone]

## 2015-02-24 NOTE — Interval H&P Note (Signed)
Cath Lab Visit (complete for each Cath Lab visit)  Clinical Evaluation Leading to the Procedure:   ACS: No.  Non-ACS:    Anginal Classification: CCS III  Anti-ischemic medical therapy: Minimal Therapy (1 class of medications)  Non-Invasive Test Results: Intermediate-risk stress test findings: cardiac mortality 1-3%/year  Prior CABG: Previous CABG      History and Physical Interval Note:  02/24/2015 11:30 AM  Danny [name]  has presented today for surgery, with the diagnosis of CAD, Abnormal Stress Test  The various methods of treatment have been discussed with the patient and family. After consideration of risks, benefits and other options for treatment, the patient has consented to  Procedure(s): Left Heart Cath and Cors/Grafts Angiography (N/A) as a surgical intervention .  The patient's history has been reviewed, patient examined, no change in status, stable for surgery.  I have reviewed the patient's chart and labs.  Questions were answered to the patient's satisfaction.     Tonny Bollman

## 2015-02-24 NOTE — Research (Signed)
TWILIGHT RESEARCH STUDY Informed Consent   Subject Name: Danny [name]  Subject met inclusion and exclusion criteria.  The informed consent form, study requirements and expectations were reviewed with the subject and questions and concerns were addressed prior to the signing of the consent form.  The subject verbalized understanding of the trial requirements.  The subject agreed to participate in the [place]  trial and signed the informed consent.  The informed consent was obtained prior to performance of any protocol-specific procedures for the subject.  A copy of the signed informed consent was given to the subject and a copy was placed in the subject's medical record.  Marlana Salvage 02/24/2015, 03:05 PM

## 2015-02-25 ENCOUNTER — Other Ambulatory Visit: Payer: Self-pay | Admitting: *Deleted

## 2015-02-25 DIAGNOSIS — I110 Hypertensive heart disease with heart failure: Secondary | ICD-10-CM | POA: Diagnosis not present

## 2015-02-25 DIAGNOSIS — I25118 Atherosclerotic heart disease of native coronary artery with other forms of angina pectoris: Secondary | ICD-10-CM | POA: Diagnosis not present

## 2015-02-25 DIAGNOSIS — I2582 Chronic total occlusion of coronary artery: Secondary | ICD-10-CM | POA: Diagnosis not present

## 2015-02-25 DIAGNOSIS — I25718 Atherosclerosis of autologous vein coronary artery bypass graft(s) with other forms of angina pectoris: Secondary | ICD-10-CM | POA: Diagnosis not present

## 2015-02-25 DIAGNOSIS — I5042 Chronic combined systolic (congestive) and diastolic (congestive) heart failure: Secondary | ICD-10-CM | POA: Diagnosis not present

## 2015-02-25 LAB — BASIC METABOLIC PANEL
Anion gap: 9 (ref 5–15)
BUN: 15 mg/dL (ref 6–20)
CHLORIDE: 108 mmol/L (ref 101–111)
CO2: 23 mmol/L (ref 22–32)
CREATININE: 1.09 mg/dL (ref 0.61–1.24)
Calcium: 8.9 mg/dL (ref 8.9–10.3)
GFR calc Af Amer: >60 mL/min (ref >60)
GFR calc non Af Amer: >60 mL/min (ref >60)
Glucose, Bld: 91 mg/dL (ref 65–99)
Potassium: 3.6 mmol/L (ref 3.5–5.1)
SODIUM: 140 mmol/L (ref 135–145)

## 2015-02-25 LAB — GLUCOSE, CAPILLARY: Glucose-Capillary: 159 mg/dL — ABNORMAL HIGH (ref 65–99)

## 2015-02-25 LAB — CBC
HCT: 44.5 % (ref 39.0–52.0)
Hemoglobin: 15.2 g/dL (ref 13.0–17.0)
MCH: 29.9 pg (ref 26.0–34.0)
MCHC: 34.2 g/dL (ref 30.0–36.0)
MCV: 87.6 fL (ref 78.0–100.0)
PLATELETS: 218 10*3/uL (ref 150–400)
RBC: 5.08 MIL/uL (ref 4.22–5.81)
RDW: 12.9 % (ref 11.5–15.5)
WBC: 7.8 10*3/uL (ref 4.0–10.5)

## 2015-02-25 MED ORDER — AMBULATORY NON FORMULARY MEDICATION: 90.0000 mg | Freq: Two times a day (BID) | Status: DC

## 2015-02-25 MED ORDER — POTASSIUM CHLORIDE CRYS ER 20 MEQ PO TBCR
40.0000 meq | EXTENDED_RELEASE_TABLET | Freq: Once | ORAL | Status: AC
  Administered 2015-02-25: 09:00:00 40 meq via ORAL
  Filled 2015-02-25: qty 2

## 2015-02-25 MED ORDER — TICAGRELOR 90 MG PO TABS: ORAL_TABLET | ORAL | Status: DC

## 2015-02-25 MED ORDER — NITROGLYCERIN 0.4 MG SL SUBL: 0.4000 mg | SUBLINGUAL_TABLET | SUBLINGUAL | Status: DC | PRN

## 2015-02-25 MED ORDER — ASPIRIN EC 81 MG PO TBEC: 81.0000 mg | DELAYED_RELEASE_TABLET | Freq: Every day | ORAL | Status: DC

## 2015-02-25 MED ORDER — AMBULATORY NON FORMULARY MEDICATION: 81.0000 mg | Freq: Every day | Status: DC

## 2015-02-25 NOTE — Progress Notes (Signed)
[id number] Walked independently in hall without CP so did not walk with pt. Pt has been making changes since I saw in February. He has gotten an insulin pump and gotten his A1C down and stated MD happy with progress. Pt stated he could not do CRP 2 last time due to insurance and still too expensive, but with new insurance, he might be able to go. I will refer to [place] CRP 2 and encouraged pt to check with insurance. Brochure given. Stressed importance of brilinta with procedure and pt is on twilight study. We discussed his eating habits and I encouraged more walking for ex as he can tolerate. Pt in good spirits and stated feeling good. Luetta Nutting RN BSN 02/25/2015 8:55 AM

## 2015-02-25 NOTE — Progress Notes (Signed)
D/c instructions reviewed with pt. Copy of instructions given to pt. Pt script electronically sent to pt's pharmacy per MD. Pt has sister picking him up at main entrance, did not want to wait in room, pt wants to wait in main lobby for sister to arrive, pt states she will be here shortly, pt has been walking halls independently with steady gait.  Pt d/c'd via wheelchair with belongings, escorted by unit NT.

## 2015-02-25 NOTE — Progress Notes (Signed)
Patient Profile: 60 y/o male with known CAD s/p CABG admitted for elective LHC in the setting of CCS III angina and high-risk Myoview.  Subjective: Feels great. No recurrent CP. No dyspnea. Ambulating w/o difficulty. Cath site is ok.  Objective: Vital signs in last 24 hours: Temp:  [97.8 F (36.6 C)-98 F (36.7 C)] 97.8 F (36.6 C) (01/10 0329) Pulse Rate:  [0-88] 81 (01/10 0329) Resp:  [0-21] 13 (01/10 0329) BP: (118-184)/(58-98) 118/70 mmHg (01/10 0329) SpO2:  [0 %-99 %] 93 % (01/10 0329) Weight:  [225 lb 5 oz (102.2 kg)] 225 lb 5 oz (102.2 kg) (01/10 0329)    Intake/Output from previous day: 01/09 0701 - 01/10 0700 In: 1360 [P.O.:960; I.V.:400] Out: 2300 [Urine:2300] Intake/Output this shift:    Medications Current Facility-Administered Medications  Medication Dose Route Frequency Provider Last Rate Last Dose  . 0.9 %  sodium chloride infusion  250 mL Intravenous PRN Tonny Bollman, MD      . acetaminophen (TYLENOL) tablet 650 mg  650 mg Oral Q4H PRN Tonny Bollman, MD      . angioplasty book   Does not apply Once Tonny Bollman, MD      . aspirin EC tablet 81 mg  81 mg Oral Daily Tonny Bollman, MD      . atorvastatin (LIPITOR) tablet 80 mg  80 mg Oral q1800 Tonny Bollman, MD   80 mg at 02/24/15 1720  . carvedilol (COREG) tablet 25 mg  25 mg Oral BID Tonny Bollman, MD   25 mg at 02/24/15 2222  . furosemide (LASIX) tablet 60 mg  60 mg Oral BID Tonny Bollman, MD   60 mg at 02/24/15 1719  . Influenza vac split quadrivalent PF (FLUARIX) injection 0.5 mL  0.5 mL Intramuscular Tomorrow-1000 Tonny Bollman, MD      . insulin pump   Subcutaneous TID AC, HS, 0200 Tonny Bollman, MD   5.2 each at 02/25/15 0753  . isosorbide mononitrate (IMDUR) 24 hr tablet 30 mg  30 mg Oral Daily Tonny Bollman, MD   30 mg at 02/24/15 1719  . losartan (COZAAR) tablet 50 mg  50 mg Oral Daily Tonny Bollman, MD      . ondansetron [facility]) injection 4 mg  4 mg Intravenous Q6H PRN Tonny Bollman, MD      . oxyCODONE-acetaminophen (PERCOCET/ROXICET) 5-325 MG per tablet 1-2 tablet  1-2 tablet Oral Q4H PRN Tonny Bollman, MD      . sodium chloride 0.9 % injection 3 mL  3 mL Intravenous Q12H Tonny Bollman, MD      . sodium chloride 0.9 % injection 3 mL  3 mL Intravenous PRN Tonny Bollman, MD      . ticagrelor [facility]) tablet 90 mg  90 mg Oral BID Tonny Bollman, MD        PE: General appearance: alert, cooperative and no distress Neck: no carotid bruit and no JVD Lungs: clear to auscultation bilaterally Heart: regular rate and rhythm, S1, S2 normal, no murmur, click, rub or gallop Extremities: no LEE Pulses: 2+ and symmetric Skin: warm and dry Neurologic: Grossly normal  Lab Results:   Recent Labs  02/24/15 0800 02/25/15 0312  WBC 5.8 7.8  HGB 15.5 15.2  HCT 44.1 44.5  PLT 254 218   BMET  Recent Labs  02/24/15 0930 02/25/15 0312  NA 140 140  K 4.7 3.6  CL 108 108  CO2 23 23  GLUCOSE 260* 91  BUN 10 15  CREATININE 1.00  1.09  CALCIUM 9.0 8.9   PT/INR  Recent Labs  02/24/15 0800  LABPROT 13.5  INR 1.01   Cardiac Panel (last 3 results) No results for input(s): CKTOTAL, CKMB, TROPONINI, RELINDX in the last 72 hours.  Studies/Results: Procedures    Coronary Stent Intervention   Left Heart Cath and Cors/Grafts Angiography    Conclusion     Ost Cx lesion, 100% stenosed.  LIMA is normal in caliber, and is anatomically normal.  Widely patent LIMA-LAD  SVG .  Prox Graft lesion, 100% stenosed.  Prox Graft lesion, 100% stenosed.  Ost 1st Diag to 1st Diag lesion, 85% stenosed.  Mid LAD lesion, 100% stenosed.  RPDA lesion, 95% stenosed. Post intervention, there is a 0% residual stenosis. The lesion was previously treated with a stent (unknown type).    Assessment/Plan  Active Problems:   Abnormal nuclear cardiac imaging test   Exertional angina (HCC)  1. CAD: Severe native 3 vessel CAD with total occlusion of the LAD, severe  diffuse stenosis of the first diagonal, total occlusion of the left circumflex, and severe stenosis of the right PDA. S/P aorto-coronary bypass surgery with continued patency of the LIMA - LAD and total occlusion of the SVG-OM and SVG-diagonal. Successful PCI + DES of the right PDA. Denies recurrent CP. No symptoms with ambulation. No dyspnea. VSS. Left radial access site is stable. Renal function is stable. Continue DAPT. He has been enrolled in the TWILIGHT Research Trial (ASA + Brilinta). Continue statin, BB and nitrate.   Dispo: d/c home later today. F/u with Dr. Excell Seltzer or APP in 1-2 weeks.   Brittainy M. Sharol Harness, PA-C 02/25/2015 8:09 AM  I have seen and examined the patient along with Brittainy M. Sharol Harness, PA-C.  I have reviewed the chart, notes and new data.  I agree with PA's note.  Key new complaints: no complaints at L radial access site, no angina (previous symptoms were all exertional) Key examination changes: no wrist hematoma, normal cardiac exam, sternotomy scar Key new findings / data: K 3.6, will give additional supplement today  PLAN: DC home. Enrolled in TWILIGHT trial of antiplatelet therapy (AS+ Brilinta). Discussed need for perfect compliance with these meds  Thurmon Fair, MD, [facility] [phone] 02/25/2015, 8:59 AM

## 2015-02-25 NOTE — Discharge Summary (Signed)
Physician Discharge Summary  Patient ID: Danny [name] MRN: [id number] DOB/AGE: [birth year] 60 y.o.   Primary Cardiologist: Dr. Excell Seltzer  Admit date: 02/24/2015 Discharge date: 02/25/2015  Admission Diagnoses: Exertional Angina/ Abnormal NST  Discharge Diagnoses:  Active Problems:   Abnormal nuclear cardiac imaging test   Exertional angina [facility])   Discharged Condition: stable  Hospital Course: 60 y/o male, followed by Dr. Excell Seltzer, with known CAD s/p CABG admitted for elective LHC in the setting of CCS III angina and high-risk Myoview. The procedure was performed by Dr. Excell Seltzer. He was found to have severe native 3 vessel CAD with total occlusion of the LAD, severe diffuse stenosis of the first diagonal, total occlusion of the left circumflex, and severe stenosis of the right PDA. S/P aorto-coronary bypass surgery with continued patency of the LIMA - LAD and total occlusion of the SVG-OM and SVG-diagonal. He underwent successful PCI + DES of the right PDA.  He tolerated the procedure well and left the cath lab in stable condition. He  denied recurrent CP. No symptoms with ambulation. No dyspnea. VSS. Left radial access site remained stable. Renal function was stable. He was placed on DAPT. He agreed to be enrolled in the TWILIGHT Research Trial (ASA + Brilinta). Medications were provided by the research team. He was continued on a statin, BB and nitrate. He was last seen and examined by Dr. Royann Shivers, who felt that he was stable for discharge home. He will f/u with Dr. Excell Seltzer or an APP in 1-2 weeks.    Consults: None  Significant Diagnostic Studies:  Procedures    Coronary Stent Intervention   Left Heart Cath and Cors/Grafts Angiography    Conclusion     Ost Cx lesion, 100% stenosed.  LIMA is normal in caliber, and is anatomically normal.  Widely patent LIMA-LAD  SVG .  Prox Graft lesion, 100% stenosed.  Prox Graft lesion, 100% stenosed.  Ost 1st Diag to 1st Diag  lesion, 85% stenosed.  Mid LAD lesion, 100% stenosed.  RPDA lesion, 95% stenosed. Post intervention, there is a 0% residual stenosis. The lesion was previously treated with a stent (unknown type).  1. Severe native 3 vessel CAD with total occlusion of the LAD, severe diffuse stenosis of the first diagonal, total occlusion of the left circumflex, and severe stenosis of the right PDA 2. S/P aorto-coronary bypass surgery with continued patency of the LIMA - LAD and total occlusion of the SVG-OM and SVG-diagonal 3. Successful PCI of the right PDA     Treatments: See Hospital Course  Discharge Exam: Blood pressure 129/65, pulse 86, temperature 97.5 F (36.4 C), temperature source Oral, resp. rate 13, height 5\' 8"  (1.727 m), weight 225 lb 5 oz (102.2 kg), SpO2 98 %.   Disposition:       Discharge Instructions    Amb Referral to Cardiac Rehabilitation    Complete by:  As directed   Diagnosis:  PCI     Diet - low sodium heart healthy    Complete by:  As directed      Increase activity slowly    Complete by:  As directed             Medication List    STOP taking these medications        aspirin EC 81 MG tablet     clopidogrel 75 MG tablet  Commonly known as:  PLAVIX     pravastatin 40 MG tablet  Commonly known as:  PRAVACHOL  TAKE these medications        atorvastatin 80 MG tablet  Commonly known as:  LIPITOR  Take 1 tablet (80 mg total) by mouth daily at 6 PM.     carvedilol 25 MG tablet  Commonly known as:  COREG  Take 1 tablet (25 mg total) by mouth 2 (two) times daily.     furosemide 40 MG tablet  Commonly known as:  LASIX  Take 1.5 tablets (60 mg total) by mouth 2 (two) times daily.     isosorbide mononitrate 30 MG 24 hr tablet  Commonly known as:  IMDUR  Take 1 tablet (30 mg total) by mouth daily.     losartan 50 MG tablet  Commonly known as:  COZAAR  Take 1 tablet (50 mg total) by mouth daily.     multivitamin tablet  Take 1 tablet by mouth  daily.     nitroGLYCERIN 0.4 MG SL tablet  Commonly known as:  NITROSTAT  Place 1 tablet (0.4 mg total) under the tongue every 5 (five) minutes as needed for chest pain.     NOVOLOG 100 UNIT/ML injection  Generic drug:  insulin aspart  Inject 100 Units into the skin once.     triamcinolone cream 0.5 %  Commonly known as:  KENALOG  Apply 1 application topically once as needed (nose).       Follow-up Information    Follow up with Danny Bollman, MD.   Specialty:  Cardiology   Why:  our office will call you with a follow-up appointment   Contact information:   [address] [place] [address] [phone]       TIME SPENT ON DISCHARGE, INCLUDING PHYSICIAN TIME: > 30 MINUTES  Signed: Jung Yurchak 02/25/2015, 2:46 PM

## 2015-03-07 ENCOUNTER — Encounter (HOSPITAL_COMMUNITY): Payer: Self-pay | Admitting: Cardiovascular Disease

## 2015-03-16 NOTE — Progress Notes (Signed)
Cardiology Office Note:    Date:  03/17/2015   ID:  Danny [name], DOB Nov 08, 1955, MRN [id number]  PCP:  Danny Chafe, MD  Cardiologist:  Dr. Tonny [name]   Electrophysiologist:  n/a  Chief Complaint  Patient presents with  . Hospitalization Follow-up    CAD >> s/p PCI  . Coronary Artery Disease    History of Present Illness:    Danny [name] is a 60 y.o. male with a hx of long-standing diabetes mellitus, CAD status post CABG in 2/16. He was admitted 03/2014 with acute lateral myocardial infarction. He had evolving ECG changes suspicious for a STEMI. Emergent LHC demonstrated diffuse 3 vessel CAD and mildly reduced LV function. Diagonal vessel was possibly the culprit. He was felt to have completed his infarction and there were no good targets for PCI. Echocardiogram demonstrated EF 50%. He underwent CABG with Dr. Dorris Fetch 03/28/14 (LIMA-LAD, SVG-D1, SVG-OM). At the time of surgery, he was noted to have severe hemorrhagic pericarditis or bloody pericardial effusion.  After DC from [facility], he was admitted to [facility] 3/16 with a/c systolic CHF. Echo demonstrated EF 35-40%. FU echo in 7/16 demonstrated EF 50%, moderate diastolic dysfunction.   He saw Dr. Tonny [name] in 12/16 for fatigue, DOE, chest burning with strenuous activity.  ETT-myoview was done and was high risk. He was set up for [facility] 02/24/15.  This demonstrated severe native 3 v CAD with severe stenosis of the RPDA and 2/3 grafts occluded (L-LAD patent).  He underwent PCI with DES to the native RPDA.  Residual CAD was not amenable to PCI 2/2 diffuse distal vessel disease/diabetic pattern.  Post PCI course was uneventful. He returns for FU.  He is doing well.  He denies any further chest pain. His breathing is improved but he still notes DOE with mod to extreme activities.  He denies syncope.  Denies orthopnea, PND, edema.  Denies cough, wheezing, bleeding.     Past Medical History  Diagnosis Date  .  Acute MI, lateral wall, initial episode of care (HCC) 03/23/2014  . Type 2 diabetes mellitus (HCC) 03/23/2014  . CAD (coronary artery disease)     a. s/p Lat MI 2/16 >> s/p CABG; b. LHC 1/17: mLAD 100, oD1 85%, oLCx 100, OM1 filled by collats from RPLB2, RPDA stent 95% ISR, L-LAD patent, S-OM1 occluded, S-D1 occluded >> PCI: Promus DES to RPDA  - residual CAD not amenable to PCI (dist vessel/diabetic appearance)   . Ischemic cardiomyopathy     a. Echo at [facility] 3/16:  EF 35-40%, diff HK, mild LVH, mild LAE, mild TR;  b. Echo 7/16: mild LVH, EF 50%, inf and lat HK-AK, Gr 2 DD, mod LAE, mild RVE, reduced RVSF, trivial TR, PASP 30 mmHg  . Chronic combined systolic and diastolic CHF (congestive heart failure) (HCC)   . Carotid stenosis     a. Carotid US 2/19 bilat ICA 1-39%  . Hyperlipidemia 10/16/2014    Past Surgical History  Procedure Laterality Date  . Left heart catheterization with coronary angiogram N/A 03/23/2014    Procedure: LEFT HEART CATHETERIZATION WITH CORONARY ANGIOGRAM;  Surgeon: Danny Chapman, MD;  Location: [facility] CATH LAB;  Service: Cardiovascular;  Laterality: N/A;  . Coronary artery bypass graft N/A 03/28/2014    Procedure: CORONARY ARTERY BYPASS GRAFTING (CABG);  Surgeon: Danny Slot, MD;  Location: [facility] OR;  Service: Open Heart Surgery;  Laterality: N/A;  . Tee without cardioversion N/A 03/28/2014    Procedure: TRANSESOPHAGEAL  ECHOCARDIOGRAM (TEE);  Surgeon: Danny Slot, MD;  Location: [facility] OR;  Service: Open Heart Surgery;  Laterality: N/A;  . Percutaneous coronary stent intervention (pci-s)  02/24/2015    PDA  . Cardiac catheterization N/A 02/24/2015    Procedure: Left Heart Cath and Cors/Grafts Angiography;  Surgeon: Danny Bollman, MD;  Location: [facility] INVASIVE CV LAB;  Service: Cardiovascular;  Laterality: N/A;  . Cardiac catheterization N/A 02/24/2015    Procedure: Coronary Stent Intervention;  Surgeon: Danny Bollman, MD;  Location: [facility] INVASIVE CV LAB;  Service:  Cardiovascular;  Laterality: N/A;    Current Medications: Outpatient Prescriptions Prior to Visit  Medication Sig Dispense Refill  . AMBULATORY NON FORMULARY MEDICATION Take 90 mg by mouth 2 (two) times daily. Medication Name: Brilinta 90 mg BID (TWILIGHT Research Study Provided do not fill)    . AMBULATORY NON FORMULARY MEDICATION Take 81 mg by mouth daily. Medication Name: ASA 81 mg daily (TWILIGHT Research Study)    . atorvastatin (LIPITOR) 80 MG tablet Take 1 tablet (80 mg total) by mouth daily at 6 PM. 30 tablet 11  . carvedilol (COREG) 25 MG tablet Take 1 tablet (25 mg total) by mouth 2 (two) times daily. 60 tablet 11  . furosemide (LASIX) 40 MG tablet Take 1.5 tablets (60 mg total) by mouth 2 (two) times daily. 90 tablet 11  . insulin aspart (NOVOLOG) 100 UNIT/ML injection Inject 100 Units into the skin once.     Marland Kitchen losartan (COZAAR) 50 MG tablet Take 1 tablet (50 mg total) by mouth daily. 90 tablet 3  . Multiple Vitamin (MULTIVITAMIN) tablet Take 1 tablet by mouth daily.    . nitroGLYCERIN (NITROSTAT) 0.4 MG SL tablet Place 1 tablet (0.4 mg total) under the tongue every 5 (five) minutes as needed for chest pain. 25 tablet 2  . triamcinolone cream (KENALOG) 0.5 % Apply 1 application topically once as needed (nose).     . isosorbide mononitrate (IMDUR) 30 MG 24 hr tablet Take 1 tablet (30 mg total) by mouth daily. 30 tablet 6   No facility-administered medications prior to visit.     Allergies:   Review of patient's allergies indicates no known allergies.   Social History   Social History  . Marital Status: Single    Spouse Name: N/A  . Number of Children: N/A  . Years of Education: N/A   Social History Main Topics  . Smoking status: Never Smoker   . Smokeless tobacco: Never Used  . Alcohol Use: No  . Drug Use: No  . Sexual Activity: Not Asked   Other Topics Concern  . None   Social History Narrative   The patient is married. He has grown children. He is a lifelong  nonsmoker. He works in Production designer, theatre/television/film.     Family History:  The patient's family history includes Diabetes in his brother; Healthy in his father and mother; Heart attack in his father. There is no history of Stroke.   ROS:   Please see the history of present illness.    Review of Systems  Constitution: Positive for malaise/fatigue and weight gain.  Cardiovascular: Positive for dyspnea on exertion.  All other systems reviewed and are negative.   Physical Exam:    VS:  BP 154/80 mmHg  Pulse 72  Ht  (1.727 m)  Wt 232 lb 12.8 oz (105.597 kg)  BMI 35.41 kg/m2   GEN: Well nourished, well developed, in no acute distress HEENT: normal Neck: no JVD, no masses Cardiac: Normal  S1/S2, RRR; no murmurs, no edema, L wrist without hematoma or mass  Respiratory:  clear to auscultation bilaterally; no wheezing, rhonchi or rales GI: soft, nontender  MS: no deformity or atrophy Skin: warm and dry, no rash Neuro:    no focal deficits  Psych: Alert and oriented x 3, normal affect  Wt Readings from Last 3 Encounters:  03/17/15 232 lb 12.8 oz (105.597 kg)  02/25/15 225 lb 5 oz (102.2 kg)  02/13/15 230 lb (104.327 kg)      Studies/Labs Reviewed:    EKG:  EKG is  ordered today.  The ekg ordered today demonstrates NSR, HR 71, LAD, IVCD, QTc 456 ms, no change from prior tracing.   Recent Labs: 03/23/2014: B Natriuretic Peptide 556.0*; TSH 0.591 03/29/2014: Magnesium 2.9* 10/16/2014: Pro B Natriuretic peptide (BNP) 187.0* 02/25/2015: BUN 15; Creatinine, Ser 1.09; Hemoglobin 15.2; Platelets 218; Potassium 3.6; Sodium 140   Recent Lipid Panel    Component Value Date/Time   CHOL 144 03/24/2014 0314   TRIG 81 03/24/2014 0314   HDL 47 03/24/2014 0314   CHOLHDL 3.1 03/24/2014 0314   VLDL 16 03/24/2014 0314   LDLCALC 81 03/24/2014 0314    Additional studies/ records that were reviewed today include:   [facility] 02/24/15 LAD mid 100%, D1 ostial 85% LCx ostial 100%, Lat OM1 filled by collats from  RPLB2 RCA with RPDA stent with 95% ISR L-LAD patent S-OM1 occluded S-D1 occluded PCI:  2.5 x 12 mm Promus DES to RPDA 1. Severe native 3 vessel CAD with total occlusion of the LAD, severe diffuse stenosis of the first diagonal, total occlusion of the left circumflex, and severe stenosis of the right PDA 2. S/P aorto-coronary bypass surgery with continued patency of the LIMA - LAD and total occlusion of the SVG-OM and SVG-diagonal 3. Successful PCI of the right PDA Continue DAPT long-term. Residual CAD is not amenable to PCI secondary to diffuse distal vessel disease/diabetic pattern.  Myoview 02/13/15 This is a high risk study with a prior MI in the basal and mid inferior, inferolateral (and possibly basal anterior) walls with a large area, severe severity ischemia in the basal and mid anterolateral, anterior, apical lateral, anterior, inferior walls and in the true apex. EF 31%  Echo 08/26/14 Mild LVH, EF 50%, inferior and lateral HK-AK, Gr 2 DD, mod LAE, mild RVE, reduced RV function, trivial TR, PASP 30 mmHg  Echo at [facility] 04/16/14 Mod L Eff, EF 35-40%, Impaired relaxation, Diff HK - Ant and AS best preserved, Mild LVH, Mild LAE, Mild TR  Carotid US 03/26/14 1-39% internal carotid artery stenosis bilaterally  Echo 04/02/14 EF 50%, mild LVH, inferior and inferolateral HK, mild MR, LA upper limits of normal, normal RV function, mild TR  Cardiac cath 03/23/14 LM: Patent.  LAD: Prox 50%, mid 75-80%. D1 trifurcates into 3 small subbranches. Ostium has 90% stenosis. I LCx: prox 90%, OM1 80% stenosis, OM2 80% stenosis. RCA: PDA 80%  EF: Anterolateral wall is akinetic. EF is 40-45%.     ASSESSMENT:    1. Coronary artery disease involving native coronary artery of native heart without angina pectoris   2. Chronic combined systolic and diastolic congestive heart failure (HCC)   3. Ischemic cardiomyopathy   4. Essential hypertension   5. Hyperlipidemia     PLAN:    In order of  problems listed above:  1. CAD - s/p lateral STEMI in 2/16 with subsequent CABG. Recent Myoview done for exertional angina high risk prompting LHC which  demonstrated 2/3 grafts occluded and high grade in-stent restenosis in the RPDA.  RPDA was treated with a DES.  He is doing better with no further chest pain. He remains short of breath with some activities. I suspect his DOE may be related to residual small vessel CAD. This is not amenable to PCI and medical Rx continues. His BP can tolerate titration of his nitrates further.  -  Continue aspirin, Brilinta, statin, beta blocker, ARB.   -  Increase Imdur to 60 mg QD.   2. Ischemic cardiomyopathy -  Recent nuc stress test with EF 31%. He is now s/p PCI to the RPDA.  Will plan on repeat Echo 90 days post PCI. If EF < 35%, refer to EP for ICD.    3. Chronic combined systolic and diastolic congestive heart failure - Volume appears stable. He does not look volume overloaded on exam.  He is NYHA 2-2b.    4. Essential hypertension - BP elevated.  Continue beta-blocker, angiotensin receptor blocker.  Increase dose of nitrates as noted.   Consider Amlodipine if BP remains high to advance antianginal therapy as well.   5. Hyperlipidemia -  Continue statin.    Medication Adjustments/Labs and Tests Ordered: Current medicines are reviewed at length with the patient today.  Concerns regarding medicines are outlined above.  Medication changes, Labs and Tests ordered today are outlined in the Patient Instructions noted below. Patient Instructions  Medication Instructions:  Increase your Isosorbide Mononitrate (Imdur) to 60 mg Once daily. A new prescription was sent to your pharmacy. You can take 2 of the Imdur 30 mg tablets to equal 60 mg until you get your new prescription.  Labwork: None today.  Testing/Procedures: 1. In 3 months (after 05/25/2015) - schedule an Echocardiogram. Your physician has requested that you have an echocardiogram.  Echocardiography is a painless test that uses sound waves to create images of your heart. It provides your doctor with information about the size and shape of your heart and how well your heart's chambers and valves are working. This procedure takes approximately one hour. There are no restrictions for this procedure.   Follow-Up: 1. Tereso Newcomer, PA-C in 6 weeks.  2. Dr. Tonny [name] in 3 months (see Dr. Excell Seltzer about 1 week after the echocardiogram has been done).  Any Other Special Instructions Will Be Listed Below (If Applicable).         Signed, Tereso Newcomer, PA-C  03/17/2015 4:42 PM    [facility] [address] [place], [place], [place]  [address] Phone: [phone]; Fax: [phone]

## 2015-03-17 ENCOUNTER — Ambulatory Visit (INDEPENDENT_AMBULATORY_CARE_PROVIDER_SITE_OTHER): Payer: BLUE CROSS/BLUE SHIELD | Admitting: Physician Assistant

## 2015-03-17 ENCOUNTER — Encounter: Payer: Self-pay | Admitting: Physician Assistant

## 2015-03-17 VITALS — BP 154/80 | HR 72 | Ht 68.0 in | Wt 232.8 lb

## 2015-03-17 DIAGNOSIS — I10 Essential (primary) hypertension: Secondary | ICD-10-CM | POA: Diagnosis not present

## 2015-03-17 DIAGNOSIS — E785 Hyperlipidemia, unspecified: Secondary | ICD-10-CM

## 2015-03-17 DIAGNOSIS — I5042 Chronic combined systolic (congestive) and diastolic (congestive) heart failure: Secondary | ICD-10-CM | POA: Diagnosis not present

## 2015-03-17 DIAGNOSIS — I2510 Atherosclerotic heart disease of native coronary artery without angina pectoris: Secondary | ICD-10-CM | POA: Diagnosis not present

## 2015-03-17 DIAGNOSIS — I255 Ischemic cardiomyopathy: Secondary | ICD-10-CM | POA: Diagnosis not present

## 2015-03-17 MED ORDER — ISOSORBIDE MONONITRATE ER 60 MG PO TB24: 60.0000 mg | ORAL_TABLET | Freq: Every day | ORAL | Status: DC

## 2015-03-17 NOTE — Patient Instructions (Addendum)
Medication Instructions:  Increase your Isosorbide Mononitrate (Imdur) to 60 mg Once daily. A new prescription was sent to your pharmacy. You can take 2 of the Imdur 30 mg tablets to equal 60 mg until you get your new prescription.  Labwork: None today.  Testing/Procedures: 1. In 3 months (after 05/25/2015) - schedule an Echocardiogram. Your physician has requested that you have an echocardiogram. Echocardiography is a painless test that uses sound waves to create images of your heart. It provides your doctor with information about the size and shape of your heart and how well your heart's chambers and valves are working. This procedure takes approximately one hour. There are no restrictions for this procedure.   Follow-Up: 1. Tereso Newcomer, PA-C in 6 weeks.  2. Dr. Tonny Bollman in 3 months (see Dr. Excell Seltzer about 1 week after the echocardiogram has been done).  Any Other Special Instructions Will Be Listed Below (If Applicable).

## 2015-03-26 ENCOUNTER — Encounter: Payer: Self-pay | Admitting: *Deleted

## 2015-03-26 DIAGNOSIS — Z006 Encounter for examination for normal comparison and control in clinical research program: Secondary | ICD-10-CM

## 2015-03-26 NOTE — Progress Notes (Signed)
TWILIGHT Research 1 month telephone follow up completed. Patient denies any adverse or bleeding events. He states he has been compliant with Brilinta. Next research appointment scheduled for 05/15/15 @3pm  He will be randomized to ASA or Placebo. Questions encouraged and answered.

## 2015-04-23 ENCOUNTER — Other Ambulatory Visit: Payer: Self-pay | Admitting: *Deleted

## 2015-04-23 MED ORDER — ATORVASTATIN CALCIUM 80 MG PO TABS: 80.0000 mg | ORAL_TABLET | Freq: Every day | ORAL | Status: DC

## 2015-04-27 NOTE — Progress Notes (Signed)
Cardiology Office Note:    Date:  04/28/2015   ID:  Danny [name], DOB 27-Aug-1955, MRN [id number]  PCP:  [name], MD  Cardiologist:  Dr. Tonny Bollman   Electrophysiologist:  n/a  Chief Complaint  Patient presents with  . Coronary Artery Disease    Follow up    History of Present Illness:     Danny [name] is a 60 y.o. male with a hx of long-standing diabetes mellitus, CAD status post CABG in 2/16. He was admitted 03/2014 with acute lateral myocardial infarction. He had evolving ECG changes suspicious for a STEMI. Emergent LHC demonstrated diffuse 3 vessel CAD and mildly reduced LV function. Diagonal vessel was possibly the culprit. He was felt to have completed his infarction and there were no good targets for PCI. Echocardiogram demonstrated EF 50%. He underwent CABG with Dr. Dorris Fetch 03/28/14 (LIMA-LAD, SVG-D1, SVG-OM). At the time of surgery, he was noted to have severe hemorrhagic pericarditis or bloody pericardial effusion. After DC from [facility], he was admitted to [facility] 3/16 with a/c systolic CHF. Echo demonstrated EF 35-40%. FU echo in 7/16 demonstrated EF 50%, moderate diastolic dysfunction.   He saw Dr. Tonny Bollman in 12/16 for fatigue, DOE, chest burning with strenuous activity. ETT-myoview was done and was high risk. He was set up for [facility] 02/24/15. This demonstrated severe native 3 v CAD with severe stenosis of the RPDA and 2/3 grafts occluded (L-LAD patent). He underwent PCI with DES to the native RPDA. Residual CAD was not amenable to PCI 2/2 diffuse distal vessel disease/diabetic pattern. I saw him in FU and adjusted his nitrates further.  He returns for FU.  Overall, he is doing well. He has some DOE but no significant change.  He denies chest pain, syncope.  Denies orthopnea, PND, edema.      Past Medical History  Diagnosis Date  . Acute MI, lateral wall, initial episode of care (HCC) 03/23/2014  . Type 2 diabetes mellitus (HCC)  03/23/2014  . CAD (coronary artery disease)     a. s/p Lat MI 2/16 >> s/p CABG; b. LHC 1/17: mLAD 100, oD1 85%, oLCx 100, OM1 filled by collats from RPLB2, RPDA stent 95% ISR, L-LAD patent, S-OM1 occluded, S-D1 occluded >> PCI: Promus DES to RPDA  - residual CAD not amenable to PCI (dist vessel/diabetic appearance)   . Ischemic cardiomyopathy     a. Echo at [facility] 3/16:  EF 35-40%, diff HK, mild LVH, mild LAE, mild TR;  b. Echo 7/16: mild LVH, EF 50%, inf and lat HK-AK, Gr 2 DD, mod LAE, mild RVE, reduced RVSF, trivial TR, PASP 30 mmHg  . Chronic combined systolic and diastolic CHF (congestive heart failure) (HCC)   . Carotid stenosis     a. Carotid US 2/19 bilat ICA 1-39%  . Hyperlipidemia 10/16/2014    Past Surgical History  Procedure Laterality Date  . Left heart catheterization with coronary angiogram N/A 03/23/2014    Procedure: LEFT HEART CATHETERIZATION WITH CORONARY ANGIOGRAM;  Surgeon: Micheline Chapman, MD;  Location: [facility] CATH LAB;  Service: Cardiovascular;  Laterality: N/A;  . Coronary artery bypass graft N/A 03/28/2014    Procedure: CORONARY ARTERY BYPASS GRAFTING (CABG);  Surgeon: Loreli Slot, MD;  Location: [facility] OR;  Service: Open Heart Surgery;  Laterality: N/A;  . Tee without cardioversion N/A 03/28/2014    Procedure: TRANSESOPHAGEAL ECHOCARDIOGRAM (TEE);  Surgeon: Loreli Slot, MD;  Location: [facility] OR;  Service: Open Heart Surgery;  Laterality: N/A;  .  Percutaneous coronary stent intervention (pci-s)  02/24/2015    PDA  . Cardiac catheterization N/A 02/24/2015    Procedure: Left Heart Cath and Cors/Grafts Angiography;  Surgeon: Tonny Bollman, MD;  Location: [facility] INVASIVE CV LAB;  Service: Cardiovascular;  Laterality: N/A;  . Cardiac catheterization N/A 02/24/2015    Procedure: Coronary Stent Intervention;  Surgeon: Tonny Bollman, MD;  Location: [facility] INVASIVE CV LAB;  Service: Cardiovascular;  Laterality: N/A;    Current Medications: Outpatient Prescriptions Prior to Visit    Medication Sig Dispense Refill  . AMBULATORY NON FORMULARY MEDICATION Take 90 mg by mouth 2 (two) times daily. Medication Name: Brilinta 90 mg BID (TWILIGHT Research Study Provided do not fill)    . AMBULATORY NON FORMULARY MEDICATION Take 81 mg by mouth daily. Medication Name: ASA 81 mg daily (TWILIGHT Research Study)    . atorvastatin (LIPITOR) 80 MG tablet Take 1 tablet (80 mg total) by mouth daily at 6 PM. 90 tablet 3  . carvedilol (COREG) 25 MG tablet Take 1 tablet (25 mg total) by mouth 2 (two) times daily. 60 tablet 11  . furosemide (LASIX) 40 MG tablet Take 1.5 tablets (60 mg total) by mouth 2 (two) times daily. 90 tablet 11  . insulin aspart (NOVOLOG) 100 UNIT/ML injection Inject 100 Units into the skin once.     . isosorbide mononitrate (IMDUR) 60 MG 24 hr tablet Take 1 tablet (60 mg total) by mouth daily. 90 tablet 3  . losartan (COZAAR) 50 MG tablet Take 1 tablet (50 mg total) by mouth daily. 90 tablet 3  . Multiple Vitamin (MULTIVITAMIN) tablet Take 1 tablet by mouth daily.    . nitroGLYCERIN (NITROSTAT) 0.4 MG SL tablet Place 1 tablet (0.4 mg total) under the tongue every 5 (five) minutes as needed for chest pain. 25 tablet 2  . triamcinolone cream (KENALOG) 0.5 % Apply 1 application topically once as needed (nose).      No facility-administered medications prior to visit.     Allergies:   Review of patient's allergies indicates no known allergies.   Social History   Social History  . Marital Status: Single    Spouse Name: N/A  . Number of Children: N/A  . Years of Education: N/A   Social History Main Topics  . Smoking status: Never Smoker   . Smokeless tobacco: Never Used  . Alcohol Use: No  . Drug Use: No  . Sexual Activity: Not Asked   Other Topics Concern  . None   Social History Narrative   The patient is married. He has grown children. He is a lifelong nonsmoker. He works in Production designer, theatre/television/film.     Family History:  The patient's family history includes Diabetes  in his brother; Healthy in his father and mother; Heart attack in his father. There is no history of Stroke.   ROS:   Please see the history of present illness.    Review of Systems  Cardiovascular: Positive for dyspnea on exertion.  All other systems reviewed and are negative.   Physical Exam:    VS:  BP 128/84 mmHg  Pulse 72  Ht 5\' 8"  (1.727 m)  Wt 235 lb 6.4 oz (106.777 kg)  BMI 35.80 kg/m2   GEN: Well nourished, well developed, in no acute distress HEENT: normal Neck: no JVD, no masses Cardiac: Normal S1/S2, RRR; no murmurs, rubs, or gallops, no edema  Respiratory:  clear to auscultation bilaterally; no wheezing, rhonchi or rales GI: soft, nontender, nondistended MS: no deformity or  atrophy Skin: warm and dry Neuro: No focal deficits  Psych: Alert and oriented x 3, normal affect  Wt Readings from Last 3 Encounters:  04/28/15 235 lb 6.4 oz (106.777 kg)  03/17/15 232 lb 12.8 oz (105.597 kg)  02/25/15 225 lb 5 oz (102.2 kg)      Studies/Labs Reviewed:     EKG:  EKG is not ordered today.  The ekg ordered today demonstrates n/a  Recent Labs: 10/16/2014: Pro B Natriuretic peptide (BNP) 187.0* 02/25/2015: BUN 15; Creatinine, Ser 1.09; Hemoglobin 15.2; Platelets 218; Potassium 3.6; Sodium 140   Recent Lipid Panel    Component Value Date/Time   CHOL 144 03/24/2014 0314   TRIG 81 03/24/2014 0314   HDL 47 03/24/2014 0314   CHOLHDL 3.1 03/24/2014 0314   VLDL 16 03/24/2014 0314   LDLCALC 81 03/24/2014 0314    Additional studies/ records that were reviewed today include:   [facility] 02/24/15 LAD mid 100%, D1 ostial 85% LCx ostial 100%, Lat OM1 filled by collats from RPLB2 RCA with RPDA stent with 95% ISR L-LAD patent S-OM1 occluded S-D1 occluded PCI: 2.5 x 12 mm Promus DES to RPDA 1. Severe native 3 vessel CAD with total occlusion of the LAD, severe diffuse stenosis of the first diagonal, total occlusion of the left circumflex, and severe stenosis of the right PDA 2. S/P  aorto-coronary bypass surgery with continued patency of the LIMA - LAD and total occlusion of the SVG-OM and SVG-diagonal 3. Successful PCI of the right PDA Continue DAPT long-term. Residual CAD is not amenable to PCI secondary to diffuse distal vessel disease/diabetic pattern.  Myoview 02/13/15 This is a high risk study with a prior MI in the basal and mid inferior, inferolateral (and possibly basal anterior) walls with a large area, severe severity ischemia in the basal and mid anterolateral, anterior, apical lateral, anterior, inferior walls and in the true apex. EF 31%  Echo 08/26/14 Mild LVH, EF 50%, inferior and lateral HK-AK, Gr 2 DD, mod LAE, mild RVE, reduced RV function, trivial TR, PASP 30 mmHg  Echo at [facility] 04/16/14 Mod L Eff, EF 35-40%, Impaired relaxation, Diff HK - Ant and AS best preserved, Mild LVH, Mild LAE, Mild TR  Carotid US 03/26/14 1-39% internal carotid artery stenosis bilaterally  Echo 04/02/14 EF 50%, mild LVH, inferior and inferolateral HK, mild MR, LA upper limits of normal, normal RV function, mild TR  Cardiac cath 03/23/14 LM: Patent.  LAD: Prox 50%, mid 75-80%. D1 trifurcates into 3 small subbranches. Ostium has 90% stenosis. I LCx: prox 90%, OM1 80% stenosis, OM2 80% stenosis. RCA: PDA 80%  EF: Anterolateral wall is akinetic. EF is 40-45%.    ASSESSMENT:     1. Coronary artery disease involving native coronary artery of native heart without angina pectoris   2. Ischemic cardiomyopathy   3. Chronic combined systolic and diastolic congestive heart failure (HCC)   4. Essential hypertension   5. Hyperlipidemia     PLAN:     In order of problems listed above:  1. CAD - s/p lateral STEMI in 2/16 with subsequent CABG. Recent Myoview done for exertional angina high risk prompting LHC which demonstrated 2/3 grafts occluded and high grade in-stent restenosis in the RPDA. RPDA was treated with a DES. No angina.  Continue aspirin, Brilinta, statin, beta  blocker, ARB, Imdur 60 mg QD.   2. Ischemic cardiomyopathy - Recent nuc stress test with EF 31%. He is now s/p PCI to the RPDA. Will plan on repeat  Echo 90 days post PCI. If EF < 35%, refer to EP for ICD. Continue beta-blocker, angiotensin receptor blocker.  Add Spironolactone 12.5 mg QD.  BMET 5 days and 12 days after 1st dose.   3. Chronic combined systolic and diastolic congestive heart failure - Volume appears stable.  He is NYHA 2-2b.   4. Essential hypertension - Controlled.   5. Hyperlipidemia - Continue statin.   Medication Adjustments/Labs and Tests Ordered: Current medicines are reviewed at length with the patient today.  Concerns regarding medicines are outlined above.  Medication changes, Labs and Tests ordered today are outlined in the Patient Instructions noted below. Patient Instructions  Medication Instructions:  1. START SPIRONOLACTONE 25 MG TABLET WITH THE DIRECTIONS TO READ: TAKE 1/2 TABLET DAILY  Labwork: 1. BMET 3/20 2. BMET 3/27  Testing/Procedures: NONE  Follow-Up: KEEP YOUR APPT WITH DR. Excell Seltzer IN 06/2015  Any Other Special Instructions Will Be Listed Below (If Applicable).  If you need a refill on your cardiac medications before your next appointment, please call your pharmacy.   Signed, Tereso Newcomer, PA-C  04/28/2015 3:47 PM    [facility] [address] [place], [place], [place]  [address] Phone: [phone]; Fax: [phone]

## 2015-04-28 ENCOUNTER — Encounter: Payer: Self-pay | Admitting: Physician Assistant

## 2015-04-28 ENCOUNTER — Ambulatory Visit (INDEPENDENT_AMBULATORY_CARE_PROVIDER_SITE_OTHER): Payer: BLUE CROSS/BLUE SHIELD | Admitting: Physician Assistant

## 2015-04-28 VITALS — BP 128/84 | HR 72 | Ht 68.0 in | Wt 235.4 lb

## 2015-04-28 DIAGNOSIS — I5042 Chronic combined systolic (congestive) and diastolic (congestive) heart failure: Secondary | ICD-10-CM

## 2015-04-28 DIAGNOSIS — I255 Ischemic cardiomyopathy: Secondary | ICD-10-CM | POA: Diagnosis not present

## 2015-04-28 DIAGNOSIS — I2510 Atherosclerotic heart disease of native coronary artery without angina pectoris: Secondary | ICD-10-CM | POA: Diagnosis not present

## 2015-04-28 DIAGNOSIS — I10 Essential (primary) hypertension: Secondary | ICD-10-CM

## 2015-04-28 DIAGNOSIS — E785 Hyperlipidemia, unspecified: Secondary | ICD-10-CM

## 2015-04-28 MED ORDER — SPIRONOLACTONE 25 MG PO TABS: 12.5000 mg | ORAL_TABLET | Freq: Every day | ORAL | Status: DC

## 2015-04-28 NOTE — Patient Instructions (Addendum)
Medication Instructions:  1. START SPIRONOLACTONE 25 MG TABLET WITH THE DIRECTIONS TO READ: TAKE 1/2 TABLET DAILY  Labwork: 1. BMET 3/20 2. BMET 3/27  Testing/Procedures: NONE  Follow-Up: KEEP YOUR APPT WITH DR. Excell Seltzer IN 06/2015  Any Other Special Instructions Will Be Listed Below (If Applicable).  If you need a refill on your cardiac medications before your next appointment, please call your pharmacy.

## 2015-04-29 ENCOUNTER — Other Ambulatory Visit: Payer: Self-pay

## 2015-04-29 MED ORDER — CARVEDILOL 25 MG PO TABS: 25.0000 mg | ORAL_TABLET | Freq: Two times a day (BID) | ORAL | Status: DC

## 2015-05-05 ENCOUNTER — Other Ambulatory Visit (INDEPENDENT_AMBULATORY_CARE_PROVIDER_SITE_OTHER): Payer: BLUE CROSS/BLUE SHIELD | Admitting: *Deleted

## 2015-05-05 DIAGNOSIS — I2510 Atherosclerotic heart disease of native coronary artery without angina pectoris: Secondary | ICD-10-CM

## 2015-05-05 DIAGNOSIS — I255 Ischemic cardiomyopathy: Secondary | ICD-10-CM

## 2015-05-05 LAB — BASIC METABOLIC PANEL
BUN: 24 mg/dL (ref 7–25)
CHLORIDE: 102 mmol/L (ref 98–110)
CO2: 27 mmol/L (ref 20–31)
Calcium: 9.3 mg/dL (ref 8.6–10.3)
Creat: 1.38 mg/dL — ABNORMAL HIGH (ref 0.70–1.33)
GLUCOSE: 77 mg/dL (ref 65–99)
POTASSIUM: 4 mmol/L (ref 3.5–5.3)
SODIUM: 141 mmol/L (ref 135–146)

## 2015-05-06 ENCOUNTER — Telehealth: Payer: Self-pay | Admitting: *Deleted

## 2015-05-06 NOTE — Telephone Encounter (Signed)
Pt notified of lab results as well s to stop the spironolactone due to creatinine increased with start of spironolactone. Pt agreeable to keeping lab appt scheduled already for next week.

## 2015-05-12 ENCOUNTER — Other Ambulatory Visit (INDEPENDENT_AMBULATORY_CARE_PROVIDER_SITE_OTHER): Payer: BLUE CROSS/BLUE SHIELD | Admitting: *Deleted

## 2015-05-12 ENCOUNTER — Telehealth: Payer: Self-pay | Admitting: *Deleted

## 2015-05-12 ENCOUNTER — Other Ambulatory Visit: Payer: Self-pay | Admitting: Physician Assistant

## 2015-05-12 DIAGNOSIS — I2510 Atherosclerotic heart disease of native coronary artery without angina pectoris: Secondary | ICD-10-CM | POA: Diagnosis not present

## 2015-05-12 DIAGNOSIS — I255 Ischemic cardiomyopathy: Secondary | ICD-10-CM

## 2015-05-12 LAB — BASIC METABOLIC PANEL
BUN: 18 mg/dL (ref 7–25)
CALCIUM: 9.4 mg/dL (ref 8.6–10.3)
CO2: 26 mmol/L (ref 20–31)
Chloride: 106 mmol/L (ref 98–110)
Creat: 1.19 mg/dL (ref 0.70–1.33)
GLUCOSE: 200 mg/dL — AB (ref 65–99)
Potassium: 4.5 mmol/L (ref 3.5–5.3)
SODIUM: 139 mmol/L (ref 135–146)

## 2015-05-12 NOTE — Telephone Encounter (Signed)
Pt notified of lab results by phone with verbal understanding. Pt aware to stop Spironolactone, pt agreeable to plan of care.

## 2015-05-15 ENCOUNTER — Encounter: Payer: Self-pay | Admitting: *Deleted

## 2015-05-15 ENCOUNTER — Other Ambulatory Visit: Payer: Self-pay | Admitting: *Deleted

## 2015-05-15 DIAGNOSIS — Z006 Encounter for examination for normal comparison and control in clinical research program: Secondary | ICD-10-CM

## 2015-05-15 MED ORDER — AMBULATORY NON FORMULARY MEDICATION: 81.0000 mg | Freq: Every day | Status: DC

## 2015-05-15 NOTE — Progress Notes (Signed)
TWILIGHT Research 3 month Randomization visit completed. Patient denies any bleeding or adverse events. States compliant with medication. Today he was RANDOMIZED to ASA 81mg  or PLACEBO. instructed pt NOT to take any open label ASA except what is provided from [employer]. Questions encouraged and answered.

## 2015-06-04 ENCOUNTER — Other Ambulatory Visit: Payer: Self-pay | Admitting: Cardiovascular Disease

## 2015-06-07 ENCOUNTER — Other Ambulatory Visit: Payer: Self-pay | Admitting: Cardiovascular Disease

## 2015-06-18 ENCOUNTER — Ambulatory Visit (HOSPITAL_COMMUNITY): Payer: BLUE CROSS/BLUE SHIELD | Attending: Internal Medicine

## 2015-06-18 ENCOUNTER — Other Ambulatory Visit: Payer: Self-pay

## 2015-06-18 DIAGNOSIS — I2510 Atherosclerotic heart disease of native coronary artery without angina pectoris: Secondary | ICD-10-CM | POA: Insufficient documentation

## 2015-06-18 DIAGNOSIS — I110 Hypertensive heart disease with heart failure: Secondary | ICD-10-CM | POA: Insufficient documentation

## 2015-06-18 DIAGNOSIS — I358 Other nonrheumatic aortic valve disorders: Secondary | ICD-10-CM | POA: Diagnosis not present

## 2015-06-18 DIAGNOSIS — Z8249 Family history of ischemic heart disease and other diseases of the circulatory system: Secondary | ICD-10-CM | POA: Diagnosis not present

## 2015-06-18 DIAGNOSIS — Z951 Presence of aortocoronary bypass graft: Secondary | ICD-10-CM | POA: Insufficient documentation

## 2015-06-18 DIAGNOSIS — I340 Nonrheumatic mitral (valve) insufficiency: Secondary | ICD-10-CM | POA: Insufficient documentation

## 2015-06-18 DIAGNOSIS — I509 Heart failure, unspecified: Secondary | ICD-10-CM | POA: Diagnosis not present

## 2015-06-18 DIAGNOSIS — E785 Hyperlipidemia, unspecified: Secondary | ICD-10-CM | POA: Diagnosis not present

## 2015-06-18 DIAGNOSIS — I255 Ischemic cardiomyopathy: Secondary | ICD-10-CM

## 2015-06-18 DIAGNOSIS — E119 Type 2 diabetes mellitus without complications: Secondary | ICD-10-CM | POA: Diagnosis not present

## 2015-06-18 DIAGNOSIS — I252 Old myocardial infarction: Secondary | ICD-10-CM | POA: Diagnosis not present

## 2015-06-19 ENCOUNTER — Telehealth: Payer: Self-pay | Admitting: *Deleted

## 2015-06-19 ENCOUNTER — Encounter: Payer: Self-pay | Admitting: Physician Assistant

## 2015-06-19 NOTE — Telephone Encounter (Signed)
Pt has been notified of echo results by phone with verbal understanding. Confirmed appt 5/11 with Dr. Excell Seltzer.

## 2015-06-23 ENCOUNTER — Encounter: Payer: Self-pay | Admitting: *Deleted

## 2015-06-23 DIAGNOSIS — Z006 Encounter for examination for normal comparison and control in clinical research program: Secondary | ICD-10-CM

## 2015-06-23 NOTE — Progress Notes (Signed)
TWILIGHT Research study month 4 telephone follow up completed. Patient denies any bleeding or adverse events. States he has been compliant with medication and not missed any doses. Questions encouraged and answered.

## 2015-06-26 ENCOUNTER — Encounter: Payer: Self-pay | Admitting: Cardiovascular Disease

## 2015-06-26 ENCOUNTER — Ambulatory Visit (INDEPENDENT_AMBULATORY_CARE_PROVIDER_SITE_OTHER): Payer: BLUE CROSS/BLUE SHIELD | Admitting: Cardiovascular Disease

## 2015-06-26 VITALS — BP 112/70 | HR 72 | Ht 68.0 in | Wt 239.6 lb

## 2015-06-26 DIAGNOSIS — I5022 Chronic systolic (congestive) heart failure: Secondary | ICD-10-CM | POA: Diagnosis not present

## 2015-06-26 DIAGNOSIS — I25118 Atherosclerotic heart disease of native coronary artery with other forms of angina pectoris: Secondary | ICD-10-CM

## 2015-06-26 NOTE — Progress Notes (Signed)
Cardiology Office Note Date:  06/26/2015   ID:  Khair, Chasteen 1956/01/12, MRN [id number]  PCP:  [name], MD  Cardiologist:  Tonny Bollman, MD    Chief Complaint  Patient presents with  . Coronary Artery Disease    c/o weight gain even after cutting carbs     History of Present Illness: Danny [name] is a 59 y.o. male who presents for Follow-up of CAD status post CABG. The patient has long-standing diabetes. He presented in February 2016 with an acute lateral wall MI complicated by post-MI pericarditis. He was found to have severe multivessel disease and underwent CABG with a LIMA to LAD, vein graft to diagonal, and vein graft OM. His LVEF has been moderately reduced at 35-40%. Follow-up echo showed some improvement in LV function after revascularization with an EF of 50%. The patient developed exertional angina and progressive shortness of breath in December 2016. A nuclear scan was high risk and he was set up for cardiac catheterization in January of this year. He was found to have patency of the LIMA to LAD, but total occlusion of his pain grafts. He underwent PCI of the native right PDA with a drug-eluting stent. Residual CAD otherwise was not amenable to PCI and medical therapy was recommended.  The patient has had no further angina with normal activities since he underwent PCI. His anginal equivalent is burning in his chest. He's had a few episodes of mild chest discomfort with heavy exertion, all of which have been brief and self-limited. He has continued to gain weight. He attributes some of this to insulin therapy for diabetes. He's had progressive shortness of breath and bendopnea associated with weight gain. He denies orthopnea, PND, or leg swelling. He's had no palpitations, lightheadedness, or syncope.    Past Medical History  Diagnosis Date  . Acute MI, lateral wall, initial episode of care (HCC) 03/23/2014  . Type 2 diabetes mellitus (HCC)  03/23/2014  . CAD (coronary artery disease)     a. s/p Lat MI 2/16 >> s/p CABG; b. LHC 1/17: mLAD 100, oD1 85%, oLCx 100, OM1 filled by collats from RPLB2, RPDA stent 95% ISR, L-LAD patent, S-OM1 occluded, S-D1 occluded >> PCI: Promus DES to RPDA  - residual CAD not amenable to PCI (dist vessel/diabetic appearance)   . Ischemic cardiomyopathy     a. Echo at [facility] 3/16:  EF 35-40%, diff HK, mild LVH, mild LAE, mild TR;  b. Echo 7/16: mild LVH, EF 50%, inf and lat HK-AK, Gr 2 DD, mod LAE, mild RVE, reduced RVSF, trivial TR, PASP 30 mmHg // c. Echo 5/17: moderate LVH, EF 45-50%, anteroseptal HK, grade 1 diastolic dysfunction, aortic sclerosis   . Chronic combined systolic and diastolic CHF (congestive heart failure) (HCC)   . Carotid stenosis     a. Carotid US 2/19 bilat ICA 1-39%  . Hyperlipidemia 10/16/2014    Past Surgical History  Procedure Laterality Date  . Left heart catheterization with coronary angiogram N/A 03/23/2014    Procedure: LEFT HEART CATHETERIZATION WITH CORONARY ANGIOGRAM;  Surgeon: Micheline Chapman, MD;  Location: [facility] CATH LAB;  Service: Cardiovascular;  Laterality: N/A;  . Coronary artery bypass graft N/A 03/28/2014    Procedure: CORONARY ARTERY BYPASS GRAFTING (CABG);  Surgeon: Loreli Slot, MD;  Location: [facility] OR;  Service: Open Heart Surgery;  Laterality: N/A;  . Tee without cardioversion N/A 03/28/2014    Procedure: TRANSESOPHAGEAL ECHOCARDIOGRAM (TEE);  Surgeon: Loreli Slot, MD;  Location: [facility] OR;  Service: Open Heart Surgery;  Laterality: N/A;  . Percutaneous coronary stent intervention (pci-s)  02/24/2015    PDA  . Cardiac catheterization N/A 02/24/2015    Procedure: Left Heart Cath and Cors/Grafts Angiography;  Surgeon: Tonny Bollman, MD;  Location: [facility] INVASIVE CV LAB;  Service: Cardiovascular;  Laterality: N/A;  . Cardiac catheterization N/A 02/24/2015    Procedure: Coronary Stent Intervention;  Surgeon: Tonny Bollman, MD;  Location: [facility] INVASIVE CV LAB;   Service: Cardiovascular;  Laterality: N/A;    Current Outpatient Prescriptions  Medication Sig Dispense Refill  . AMBULATORY NON FORMULARY MEDICATION Take 90 mg by mouth 2 (two) times daily. Medication Name: Brilinta 90 mg BID (TWILIGHT Research Study Provided do not fill)    . AMBULATORY NON FORMULARY MEDICATION Take 81 mg by mouth daily. Medication Name: ASA 81 MG Daily or PLACEBO (TWILIGHT Research study provided)    . atorvastatin (LIPITOR) 80 MG tablet Take 1 tablet (80 mg total) by mouth daily at 6 PM. 90 tablet 3  . carvedilol (COREG) 25 MG tablet Take 1 tablet (25 mg total) by mouth 2 (two) times daily. 180 tablet 3  . furosemide (LASIX) 40 MG tablet TAKE ONE & ONE-HALF TABLETS BY MOUTH TWICE DAILY 90 tablet 3  . insulin aspart (NOVOLOG) 100 UNIT/ML injection Inject 100 Units into the skin continuous. Patient has insulin pump    . isosorbide mononitrate (IMDUR) 60 MG 24 hr tablet Take 1 tablet (60 mg total) by mouth daily. 90 tablet 3  . losartan (COZAAR) 50 MG tablet Take 1 tablet (50 mg total) by mouth daily. 90 tablet 3  . Multiple Vitamin (MULTIVITAMIN) tablet Take 1 tablet by mouth daily.    . nitroGLYCERIN (NITROSTAT) 0.4 MG SL tablet Place 1 tablet (0.4 mg total) under the tongue every 5 (five) minutes as needed for chest pain. 25 tablet 2  . triamcinolone cream (KENALOG) 0.5 % Apply 1 application topically daily as needed (nose).      No current facility-administered medications for this visit.    Allergies:   Spironolactone   Social History:  The patient  reports that he has never smoked. He has never used smokeless tobacco. He reports that he does not drink alcohol or use illicit drugs.   Family History:  The patient's  family history includes Diabetes in his brother; Healthy in his father and mother; Heart attack in his father. There is no history of Stroke.    ROS:  Please see the history of present illness.  Otherwise, review of systems is positive for fatigue.  All  other systems are reviewed and negative.    PHYSICAL EXAM: VS:  BP 112/70 mmHg  Pulse 72  Ht 5\' 8"  (1.727 m)  Wt 239 lb 9.6 oz (108.682 kg)  BMI 36.44 kg/m2 , BMI Body mass index is 36.44 kg/(m^2). GEN: Well nourished, well developed, overweight male in no acute distress HEENT: normal Neck: no JVD, no masses. No carotid bruits Cardiac: RRR without murmur or gallop                Respiratory:  clear to auscultation bilaterally, normal work of breathing GI: soft, nontender, nondistended, + BS MS: no deformity or atrophy Ext: no pretibial edema, pedal pulses 2+= bilaterally Skin: warm and dry, no rash Neuro:  Strength and sensation are intact Psych: euthymic mood, full affect  EKG:  EKG is not ordered today.  2D Echo 06/18/2015: Study Conclusions  - Left ventricle: The cavity size  was normal. Wall thickness was  increased in a pattern of moderate LVH. Systolic function was  mildly reduced. The estimated ejection fraction was in the range  of 45% to 50%. Inferoseptal hypokinesis. Doppler parameters are  consistent with abnormal left ventricular relaxation (grade 1  diastolic dysfunction). The E/e&' ratio is between 8-15,  suggesting indeterminate LV filling pressure. - Aortic valve: Trileaflet. Sclerosis without stenosis. There was  no regurgitation. - Mitral valve: Mildly thickened leaflets . There was trivial  regurgitation. - Left atrium: The atrium was normal in size. - Inferior vena cava: The vessel was normal in size. The  respirophasic diameter changes were in the normal range (>= 50%),  consistent with normal central venous pressure.  Impressions:  - Compared to the prior echo in 08/2014, the LVEF is slightly lower  at 45-50%. There is inferoseptal hypokinesis, however, the  lateral wall motion has improved.   Recent Labs: 10/16/2014: Pro B Natriuretic peptide (BNP) 187.0* 02/25/2015: Hemoglobin 15.2; Platelets 218 05/12/2015: BUN 18; Creat 1.19;  Potassium 4.5; Sodium 139   Lipid Panel     Component Value Date/Time   CHOL 144 03/24/2014 0314   TRIG 81 03/24/2014 0314   HDL 47 03/24/2014 0314   CHOLHDL 3.1 03/24/2014 0314   VLDL 16 03/24/2014 0314   LDLCALC 81 03/24/2014 0314      Wt Readings from Last 3 Encounters:  06/26/15 239 lb 9.6 oz (108.682 kg)  04/28/15 235 lb 6.4 oz (106.777 kg)  03/17/15 232 lb 12.8 oz (105.597 kg)    ASSESSMENT AND PLAN: 1. CAD - s/p lateral STEMI in 2/16 with subsequent CABG. CCS I angina. The patient is doing well. He only has angina with very high level physical exertion. Prior to stenting, he would have angina with just walking to the mailbox. He will continue on his current medical program.  2. Ischemic cardiomyopathy - recent echo reviewed and showed stable, mild LV systolic dysfunction with regional wall motion abnormalities c/w his known CAD.  Patient will continue current medications.  3. Chronic combined systolic and diastolic congestive heart failure - Volume appears stable. He is NYHA II. Biggest issue currently is weight gain. He's gained a great deal of weight and feels like he's been following a prudent diet. He is walking his dog and is reasonably active. I talked to him at length about further dietary changes and an increase in his exercise program. He follows up with his endocrinologist in the near future and will discuss whether changes in his diabetes regimen might help.  4. Essential hypertension - Controlled.   5. Hyperlipidemia - Continue statin.  Current medicines are reviewed with the patient today.  The patient does not have concerns regarding medicines.  Labs/ tests ordered today include:  No orders of the defined types were placed in this encounter.   Disposition:   FU 6 months  Signed, Tonny Bollman, MD  06/26/2015 6:26 PM    [facility] [address] [place], [place], [place]  [address] Phone: [phone]; Fax: [phone]

## 2015-06-26 NOTE — Patient Instructions (Signed)

## 2015-09-15 ENCOUNTER — Other Ambulatory Visit: Payer: Self-pay | Admitting: Cardiovascular Disease

## 2015-10-28 IMAGING — CR DG CHEST 1V
1 series · 1 of 1 positions shown · non-contrast
Comparison: 03/31/2014

CLINICAL DATA: Open heart surgery 2 weeks ago, irregular heart beat

EXAM:
CHEST  1 VIEW

[ap]
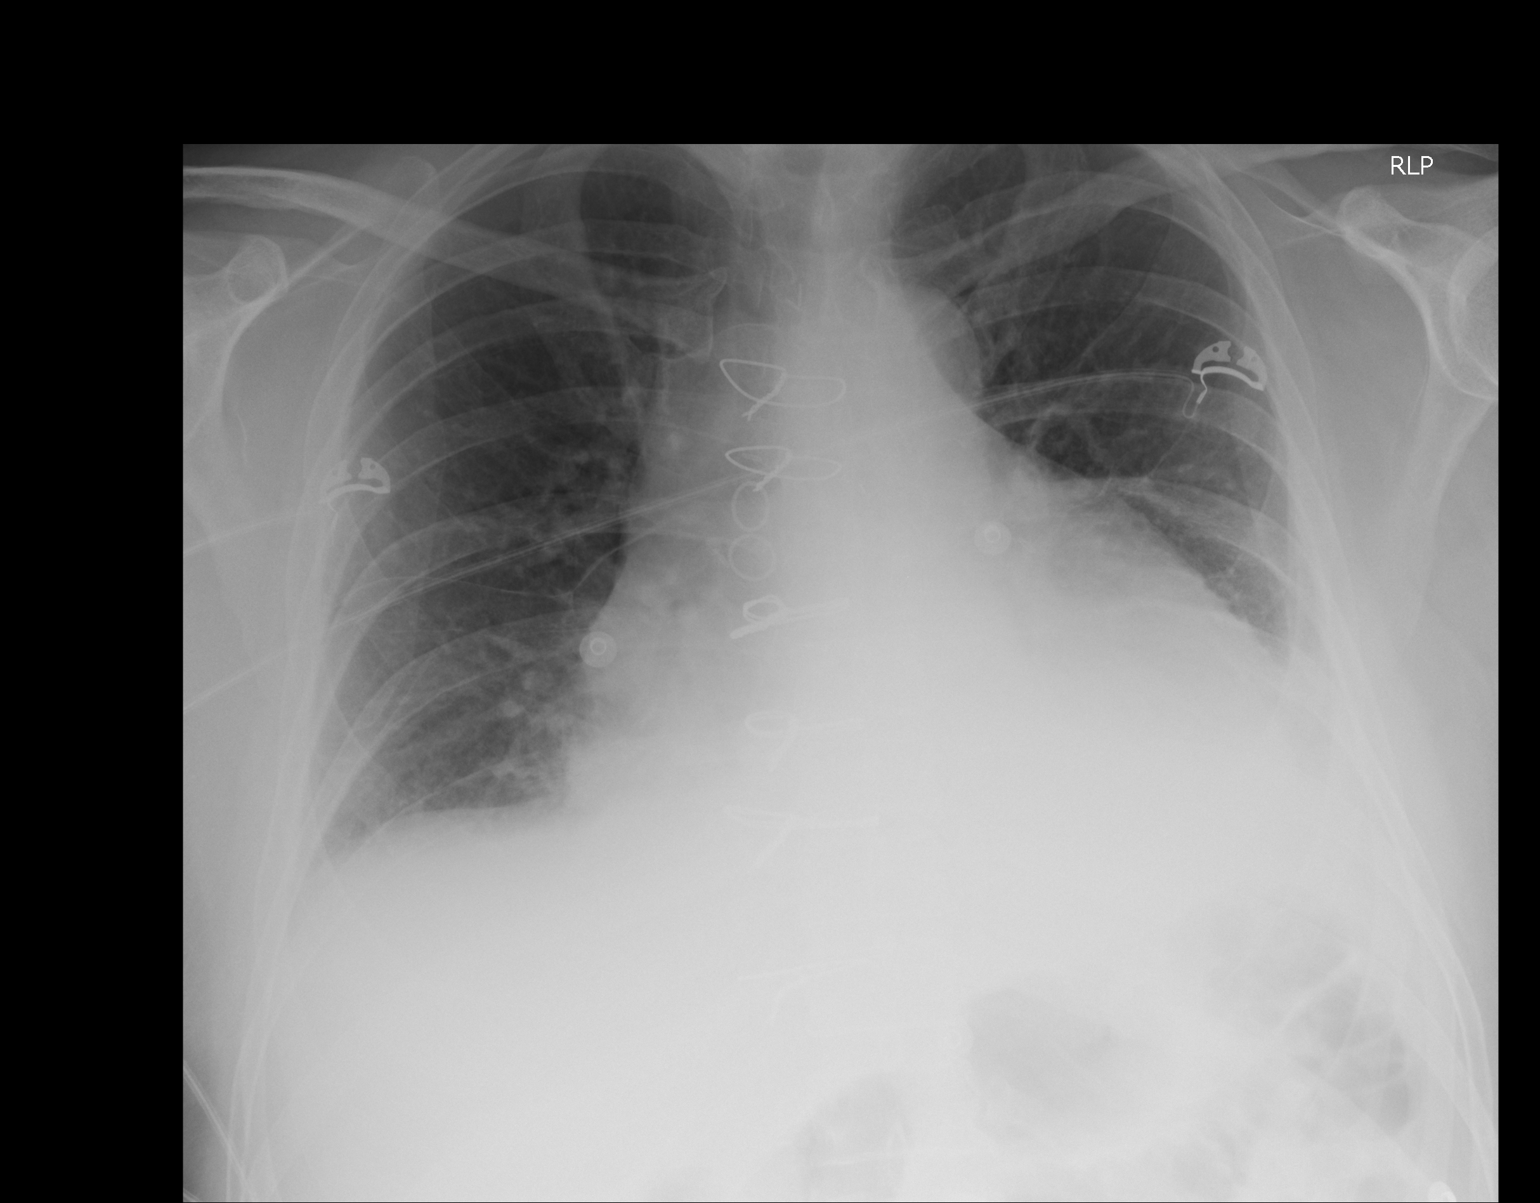

[1 of 1 positions shown; findings below may reference images not displayed]

FINDINGS: Cardiomegaly again noted. Status post CABG. No acute infiltrate or
pulmonary edema. Mild left basilar atelectasis or scarring. Question
tiny left pleural effusion.
IMPRESSION: Cardiomegaly. Status post CABG. Mild left basilar atelectasis or
scarring. No pulmonary edema. Question tiny left pleural effusion.

## 2015-11-03 IMAGING — CR DG CHEST 1V PORT
1 series · 1 of 1 positions shown · non-contrast
Comparison: None.

CLINICAL DATA: Sternotomy wires overlies stable enlarged cardiac
silhouette. There is new interstitial edema pattern. Small
effusions.

EXAM:
PORTABLE CHEST - 1 VIEW

[ap]
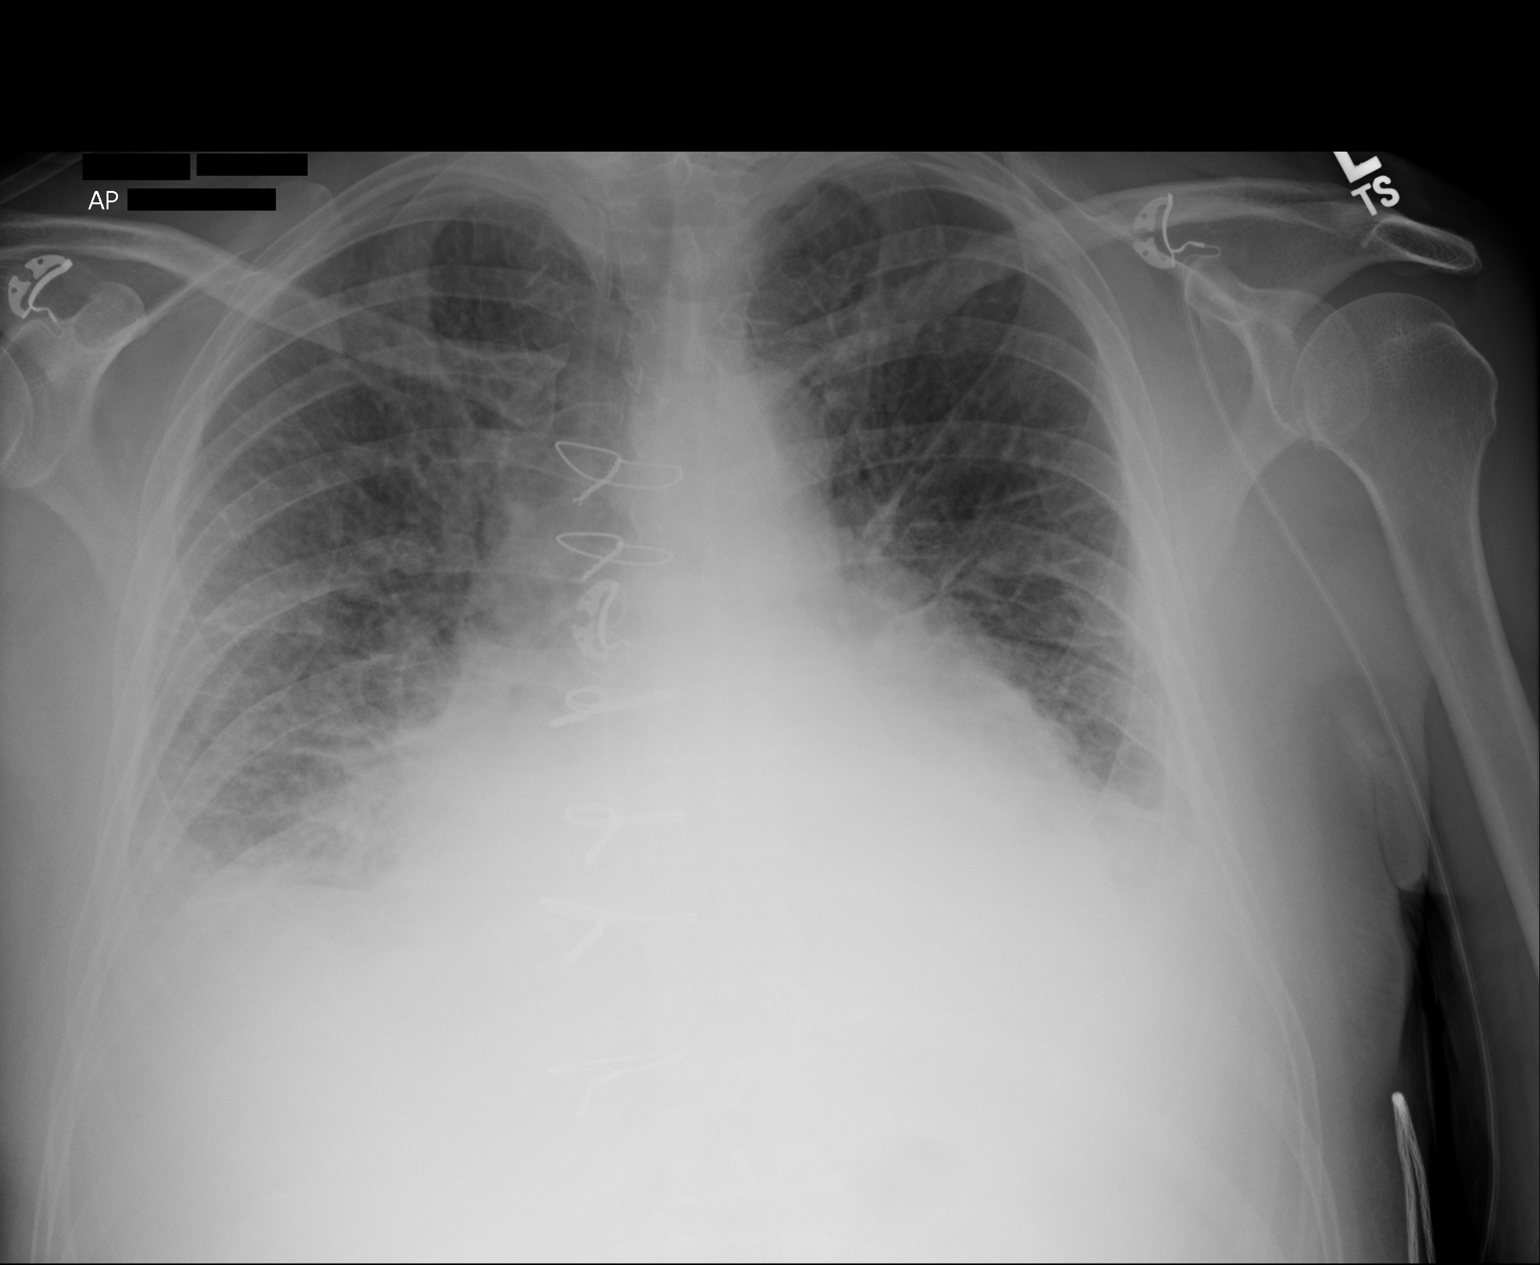

[1 of 1 positions shown; findings below may reference images not displayed]

FINDINGS: Stable cardiac silhouette. There is new peripheral linear
interstitial pattern. No focal consolidation. Small effusions.
IMPRESSION: New interstitial edema and effusions.  Stable cardiomegaly.

## 2015-12-08 ENCOUNTER — Encounter: Payer: Self-pay | Admitting: *Deleted

## 2015-12-08 DIAGNOSIS — Z006 Encounter for examination for normal comparison and control in clinical research program: Secondary | ICD-10-CM

## 2015-12-08 NOTE — Progress Notes (Signed)
TWILIGHT research study month 9 visit completed. Patient denies any bleeding or other adverse events. Patient has been 67.635 compliant with ASA/Placebo. 65.75 compliant with Brilinta after pill count. Next research appointment will be due before 06/18/16. This will be end of therapy appointment and further antiplatelet therapy will be at the discretion of his cardiologist. Questions encouraged and answered.

## 2015-12-31 ENCOUNTER — Telehealth: Payer: Self-pay | Admitting: Cardiovascular Disease

## 2015-12-31 NOTE — Telephone Encounter (Signed)
I spoke with the pt and he complains of weight gain and muscle stiffness.  The pt said his pants size has gone from a 34 to a 42.  The pt did see his endocrinologist and they are running thyroid tests at this time.  The pt is due for ROV with Dr Excell Seltzer this month and I have scheduled the pt to see Dr Excell Seltzer tomorrow.

## 2015-12-31 NOTE — Telephone Encounter (Signed)
New Message  Pt voiced wanting to speak to nurse, no CP nor SOB.  Pt voiced it's very important.  Please f/u

## 2015-12-31 NOTE — Telephone Encounter (Signed)
Left message on machine for pt to contact the office.  The pt is due for follow-up with Dr Excell Seltzer.

## 2015-12-31 NOTE — Telephone Encounter (Signed)
F/u Message  Pt call requesting to speak with RN about scheduling an appt. Pt states he really needs to be seen by Dr. Excell Seltzer. Pt states he has been gaining a lot of weight and feeling very stiff at times. Pt voiced when driving; he goes to turn and look and it feels like something is holding him back. please call back to discuss

## 2016-01-01 ENCOUNTER — Ambulatory Visit (INDEPENDENT_AMBULATORY_CARE_PROVIDER_SITE_OTHER): Payer: BLUE CROSS/BLUE SHIELD | Admitting: Cardiovascular Disease

## 2016-01-01 ENCOUNTER — Encounter: Payer: Self-pay | Admitting: Cardiovascular Disease

## 2016-01-01 VITALS — BP 136/100 | HR 65 | Ht 68.0 in | Wt 252.4 lb

## 2016-01-01 DIAGNOSIS — I5043 Acute on chronic combined systolic (congestive) and diastolic (congestive) heart failure: Secondary | ICD-10-CM | POA: Diagnosis not present

## 2016-01-01 MED ORDER — ATORVASTATIN CALCIUM 40 MG PO TABS: 40.0000 mg | ORAL_TABLET | Freq: Every day | ORAL | 3 refills | Status: DC

## 2016-01-01 MED ORDER — CO-ENZYME Q10 200 MG PO CAPS: 200.0000 mg | ORAL_CAPSULE | Freq: Every day | ORAL | Status: DC

## 2016-01-01 MED ORDER — FUROSEMIDE 80 MG PO TABS: 80.0000 mg | ORAL_TABLET | Freq: Two times a day (BID) | ORAL | 3 refills | Status: DC

## 2016-01-01 NOTE — Progress Notes (Signed)
Cardiology Office Note Date:  01/01/2016   ID:  Danny, [name] 1955/11/06, MRN [id number]  PCP:  [name], MD  Cardiologist:  Tonny Bollman, MD    Chief Complaint  Patient presents with  . Shortness of Breath   History of Present Illness: Danny [name] is a 60 y.o. male who presents for follow-up of CAD status post CABG. The patient has long-standing diabetes. He presented in February 2016 with an acute lateral wall MI complicated by post-MI pericarditis. He was found to have severe multivessel disease and underwent CABG with a LIMA to LAD, vein graft to diagonal, and vein graft OM. His LVEF has been moderately reduced at 35-40%. Follow-up echo showed some improvement in LV function after revascularization with an EF of 50%. The patient developed exertional angina and progressive shortness of breath in December 2016. A nuclear scan was high risk and he was set up for cardiac catheterization in January of this year. He was found to have patency of the LIMA to LAD, but total occlusion of his vein grafts. He underwent PCI of the native right PDA with a drug-eluting stent. Residual CAD otherwise was not amenable to PCI and medical therapy was recommended.  The patient biggest issues that he has gained a lot of weight since his last office visit. He denies chest pain or pressure. He admits to shortness of breath with bending over or walking any significant difference. He's trying to walk around his neighborhood for exercise every day. He's been following a low carbohydrate diet. He's on insulin, recent labs done by his endocrinologist. All data reviewed today.  Past Medical History:  Diagnosis Date  . Acute MI, lateral wall, initial episode of care (HCC) 03/23/2014  . CAD (coronary artery disease)    a. s/p Lat MI 2/16 >> s/p CABG; b. LHC 1/17: mLAD 100, oD1 85%, oLCx 100, OM1 filled by collats from RPLB2, RPDA stent 95% ISR, L-LAD patent, S-OM1 occluded, S-D1 occluded  >> PCI: Promus DES to RPDA  - residual CAD not amenable to PCI (dist vessel/diabetic appearance)   . Carotid stenosis    a. Carotid US 2/19 bilat ICA 1-39%  . Chronic combined systolic and diastolic CHF (congestive heart failure) (HCC)   . Hyperlipidemia 10/16/2014  . Ischemic cardiomyopathy    a. Echo at [facility] 3/16:  EF 35-40%, diff HK, mild LVH, mild LAE, mild TR;  b. Echo 7/16: mild LVH, EF 50%, inf and lat HK-AK, Gr 2 DD, mod LAE, mild RVE, reduced RVSF, trivial TR, PASP 30 mmHg // c. Echo 5/17: moderate LVH, EF 45-50%, anteroseptal HK, grade 1 diastolic dysfunction, aortic sclerosis   . Type 2 diabetes mellitus (HCC) 03/23/2014    Past Surgical History:  Procedure Laterality Date  . CARDIAC CATHETERIZATION N/A 02/24/2015   Procedure: Left Heart Cath and Cors/Grafts Angiography;  Surgeon: Tonny Bollman, MD;  Location: [facility] INVASIVE CV LAB;  Service: Cardiovascular;  Laterality: N/A;  . CARDIAC CATHETERIZATION N/A 02/24/2015   Procedure: Coronary Stent Intervention;  Surgeon: Tonny Bollman, MD;  Location: [facility] INVASIVE CV LAB;  Service: Cardiovascular;  Laterality: N/A;  . CORONARY ARTERY BYPASS GRAFT N/A 03/28/2014   Procedure: CORONARY ARTERY BYPASS GRAFTING (CABG);  Surgeon: Loreli Slot, MD;  Location: [facility] OR;  Service: Open Heart Surgery;  Laterality: N/A;  . LEFT HEART CATHETERIZATION WITH CORONARY ANGIOGRAM N/A 03/23/2014   Procedure: LEFT HEART CATHETERIZATION WITH CORONARY ANGIOGRAM;  Surgeon: Micheline Chapman, MD;  Location: [facility] CATH LAB;  Service: Cardiovascular;  Laterality: N/A;  . PERCUTANEOUS CORONARY STENT INTERVENTION (PCI-S)  02/24/2015   PDA  . TEE WITHOUT CARDIOVERSION N/A 03/28/2014   Procedure: TRANSESOPHAGEAL ECHOCARDIOGRAM (TEE);  Surgeon: Loreli SlotSteven C Hendrickson, MD;  [facility];  Service: Open Heart Surgery;  Laterality: N/A;    Current Outpatient Prescriptions  Medication Sig Dispense Refill  . AMBULATORY NON FORMULARY MEDICATION Take 90 mg by mouth 2 (two) times  daily. Medication Name: Brilinta 90 mg BID (TWILIGHT Research Study Provided do not fill)    . AMBULATORY NON FORMULARY MEDICATION Take 81 mg by mouth daily. Medication Name: ASA 81 MG Daily or PLACEBO (TWILIGHT Research study provided)    . atorvastatin (LIPITOR) 40 MG tablet Take 1 tablet (40 mg total) by mouth daily at 6 PM. 90 tablet 3  . carvedilol (COREG) 25 MG tablet Take 1 tablet (25 mg total) by mouth 2 (two) times daily. 180 tablet 3  . furosemide (LASIX) 80 MG tablet Take 1 tablet (80 mg total) by mouth 2 (two) times daily. 180 tablet 3  . insulin aspart (NOVOLOG) 100 UNIT/ML injection Inject 100 Units into the skin continuous. Patient has insulin pump    . isosorbide mononitrate (IMDUR) 60 MG 24 hr tablet Take 1 tablet (60 mg total) by mouth daily. 90 tablet 3  . losartan (COZAAR) 50 MG tablet Take 1 tablet (50 mg total) by mouth daily. 90 tablet 3  . Multiple Vitamin (MULTIVITAMIN) tablet Take 1 tablet by mouth daily.    . nitroGLYCERIN (NITROSTAT) 0.4 MG SL tablet Place 1 tablet (0.4 mg total) under the tongue every 5 (five) minutes as needed for chest pain. 25 tablet 2  . triamcinolone cream (KENALOG) 0.5 % Apply 1 application topically daily as needed (nose).     Marland Kitchen. Co-Enzyme Q10 200 MG CAPS Take 200 mg by mouth daily.     No current facility-administered medications for this visit.     Allergies:   Spironolactone   Social History:  The patient  reports that he has never smoked. He has never used smokeless tobacco. He reports that he does not drink alcohol or use drugs.   Family History:  The patient's  family history includes Diabetes in his brother; Healthy in his father and mother; Heart attack in his father.    ROS:  Please see the history of present illness.  Otherwise, review of systems is positive for cough, shortness of breath with activity, muscle pain, fatigue, constipation.  All other systems are reviewed and negative.    PHYSICAL EXAM: VS:  BP (!) 136/100 (BP  Location: Left Arm, Patient Position: Sitting, Cuff Size: Large)   Pulse 65   Ht 5\' 8"  (1.727 m)   Wt 252 lb 6.4 oz (114.5 kg)   BMI 38.38 kg/m  , BMI Body mass index is 38.38 kg/m. GEN: Well nourished, well developed, obese gentleman in no acute distress  HEENT: normal  Neck: no JVD, no masses. No carotid bruits Cardiac: RRR without murmur or gallop                Respiratory:  clear to auscultation bilaterally, normal work of breathing GI: soft, nontender, nondistended, + BS, obese MS: no deformity or atrophy  Ext: 1+ bilateral pretibial edema, pedal pulses 2+= bilaterally Skin: warm and dry, no rash Neuro:  Strength and sensation are intact Psych: euthymic mood, full affect  EKG:  EKG is ordered today. The ekg ordered today shows normal sinus rhythm 65 bpm, rightward axis,  nonspecific ST abnormality.  Recent Labs: 02/25/2015: Hemoglobin 15.2; Platelets 218 05/12/2015: BUN 18; Creat 1.19; Potassium 4.5; Sodium 139   Lipid Panel     Component Value Date/Time   CHOL 144 03/24/2014 0314   TRIG 81 03/24/2014 0314   HDL 47 03/24/2014 0314   CHOLHDL 3.1 03/24/2014 0314   VLDL 16 03/24/2014 0314   LDLCALC 81 03/24/2014 0314      Wt Readings from Last 3 Encounters:  01/01/16 252 lb 6.4 oz (114.5 kg)  06/26/15 239 lb 9.6 oz (108.7 kg)  04/28/15 235 lb 6.4 oz (106.8 kg)     Cardiac Studies Reviewed: 2D Echo 06/18/2015: Study Conclusions  - Left ventricle: The cavity size was normal. Wall thickness was  increased in a pattern of moderate LVH. Systolic function was  mildly reduced. The estimated ejection fraction was in the range  of 45% to 50%. Inferoseptal hypokinesis. Doppler parameters are  consistent with abnormal left ventricular relaxation (grade 1  diastolic dysfunction). The E/e&' ratio is between 8-15,  suggesting indeterminate LV filling pressure. - Aortic valve: Trileaflet. Sclerosis without stenosis. There was  no regurgitation. - Mitral valve: Mildly  thickened leaflets . There was trivial  regurgitation. - Left atrium: The atrium was normal in size. - Inferior vena cava: The vessel was normal in size. The  respirophasic diameter changes were in the normal range (>= 50%),  consistent with normal central venous pressure.  Impressions:  - Compared to the prior echo in 08/2014, the LVEF is slightly lower  at 45-50%. There is inferoseptal hypokinesis, however, the  lateral wall motion has improved.  ASSESSMENT AND PLAN: 1.  CAD, native vessel and bypass graft disease, with angina: The patient is not having any symptoms on his current medical regimen.  2. Chronic combined systolic and diastolic heart failure, underlying ischemic cardiomyopathy: He does have some evidence of volume excess. I'm not sure this is all related to obesity and weight gain or if she truly does have some degree of volume overload. He has 1+ pretibial edema bilaterally. Have recommended that he increase furosemide 80 mg twice daily. We discussed the need for significant weight loss, dietary modification, and increased activity. He has nutritional counseling through his endocrinology office.  3. Hypertension with heart failure: Blood pressure uncontrolled. Increase furosemide. Reduce sodium intake. Needs weight loss.  4. Mixed hyperlipidemia: Last lipids reviewed. Seems to be having side effects from his statin drug. Will reduce dose by 50% and start coenzyme Q 10.  5. Obesity: This is his most significant problem at present. He has gained great heel of weight. He's now on increasing doses of insulin and he understands that he's on a cycle of worsening insulin resistance. Stressed the importance of regular exercise and reduced calories.  Current medicines are reviewed with the patient today.  The patient does not have concerns regarding medicines.  Labs/ tests ordered today include:  No orders of the defined types were placed in this encounter.   Disposition:    FU 3 months with APP  Signed, Tonny Bollman, MD  01/01/2016 5:14 PM    [facility] [address] [place], [place], [place]  [address] Phone: [phone]; Fax: [phone]

## 2016-01-01 NOTE — Patient Instructions (Signed)
Medication Instructions:  Your physician has recommended you make the following change in your medication:  1. DECREASE Atorvastatin to 40mg  take one tablet by mouth daily 2. Co Enzyme Q10 200mg  daily 3. INCREASE Furosemide to 80mg  take one tablet by mouth twice a day  Labwork: No new orders.   Testing/Procedures: No new orders.   Follow-Up: Your physician recommends that you schedule a follow-up appointment in: 3 MONTHS with PA/NP   Any Other Special Instructions Will Be Listed Below (If Applicable).     If you need a refill on your cardiac medications before your next appointment, please call your pharmacy.

## 2016-01-12 NOTE — Addendum Note (Signed)
Addended by: Iona Coach on: 01/12/2016 02:04 PM   Modules accepted: Orders

## 2016-04-02 ENCOUNTER — Ambulatory Visit: Payer: BLUE CROSS/BLUE SHIELD | Admitting: Physician Assistant

## 2016-04-05 ENCOUNTER — Encounter: Payer: Self-pay | Admitting: Physician Assistant

## 2016-04-05 ENCOUNTER — Ambulatory Visit (INDEPENDENT_AMBULATORY_CARE_PROVIDER_SITE_OTHER): Payer: BLUE CROSS/BLUE SHIELD | Admitting: Physician Assistant

## 2016-04-05 VITALS — BP 150/88 | HR 78 | Ht 68.0 in | Wt 246.1 lb

## 2016-04-05 DIAGNOSIS — E785 Hyperlipidemia, unspecified: Secondary | ICD-10-CM

## 2016-04-05 DIAGNOSIS — I10 Essential (primary) hypertension: Secondary | ICD-10-CM | POA: Diagnosis not present

## 2016-04-05 DIAGNOSIS — I5042 Chronic combined systolic (congestive) and diastolic (congestive) heart failure: Secondary | ICD-10-CM | POA: Diagnosis not present

## 2016-04-05 DIAGNOSIS — I2510 Atherosclerotic heart disease of native coronary artery without angina pectoris: Secondary | ICD-10-CM

## 2016-04-05 NOTE — Progress Notes (Signed)
Cardiology Office Note:    Date:  04/05/2016   ID:  [name], DOB 1955-03-15, MRN [id number]  PCP:  [name], MD  Cardiologist:  Dr. Tonny Bollman   Electrophysiologist:  n/a  Referring MD: [name], MD   Chief Complaint  Patient presents with  . Follow-up    CAD, CHF    History of Present Illness:    Danny [name] is a 61 y.o. male with a hx of long-standing diabetes mellitus, CAD s/p CABG in 2/16. He was admitted 03/2014 with acute lateral myocardial infarction. He had evolving ECG changes suspicious for a STEMI. Emergent LHC demonstrated diffuse 3 vessel CAD and mildly reduced LV function. Diagonal vessel was possibly the culprit. He was felt to have completed his infarction and there were no good targets for PCI. Echocardiogram demonstrated EF 50%. He underwent CABG with Dr. Dorris Fetch 03/28/14 (LIMA-LAD, SVG-D1, SVG-OM). At the time of surgery, he was noted to have severe hemorrhagic pericarditis or bloody pericardial effusion. After DC from [facility], he was admitted to [facility] 3/16 with a/c systolic CHF. Echo demonstrated EF 35-40%. FU echo in 7/16 demonstrated EF 50%, moderate diastolic dysfunction.   ETT-myoview in 01/2015 was high risk. LHC in 1/17 demonstrated severe native 3 v CAD with severe stenosis of the RPDA and 2/3 grafts occluded (L-LAD patent). He underwent PCI with DES to the native RPDA. Residual CAD was not amenable to PCI 2/2 diffuse distal vessel disease/diabetic pattern.  Last seen by Dr. Excell Seltzer 11/17.  He returns for Cardiology follow up.  He is here alone. He is doing well. He denies chest pain or significant dyspnea. He denies orthopnea, PND or edema. He denies syncope. He has recently been placed on amoxicillin for sinusitis.   Prior CV studies:   The following studies were reviewed today:  Echo 5/17:  moderate LVH, EF 45-50%, anteroseptal HK, grade 1 diastolic dysfunction, aortic sclerosis   LHC 02/24/15 LAD  mid 100%, D1 ostial 85% LCx ostial 100%, Lat OM1 filled by collats from RPLB2 RCA with RPDA stent with 95% ISR L-LAD patent S-OM1 occluded S-D1 occluded PCI: 2.5 x 12 mm Promus DES to RPDA 1. Severe native 3 vessel CAD with total occlusion of the LAD, severe diffuse stenosis of the first diagonal, total occlusion of the left circumflex, and severe stenosis of the right PDA 2. S/P aorto-coronary bypass surgery with continued patency of the LIMA - LAD and total occlusion of the SVG-OM and SVG-diagonal 3. Successful PCI of the right PDA Continue DAPT long-term. Residual CAD is not amenable to PCI secondary to diffuse distal vessel disease/diabetic pattern.  Myoview 02/13/15 This is a high risk study with a prior MI in the basal and mid inferior, inferolateral (and possibly basal anterior) walls with a large area, severe severity ischemia in the basal and mid anterolateral, anterior, apical lateral, anterior, inferior walls and in the true apex. EF 31%  Echo 08/26/14 Mild LVH, EF 50%, inferior and lateral HK-AK, Gr 2 DD, mod LAE, mild RVE, reduced RV function, trivial TR, PASP 30 mmHg  Echo at [facility] 04/16/14 Mod L Eff, EF 35-40%, Impaired relaxation, Diff HK - Ant and AS best preserved, Mild LVH, Mild LAE, Mild TR  Carotid US 03/26/14 1-39% internal carotid artery stenosis bilaterally  Echo 04/02/14 EF 50%, mild LVH, inferior and inferolateral HK, mild MR, LA upper limits of normal, normal RV function, mild TR  Cardiac cath 03/23/14 LM: Patent.  LAD: Prox 50%, mid 75-80%. D1  trifurcates into 3 small subbranches. Ostium has 90% stenosis. I LCx: prox 90%, OM1 80% stenosis, OM2 80% stenosis. RCA: PDA 80%  EF: Anterolateral wall is akinetic. EF is 40-45%.   Past Medical History:  Diagnosis Date  . Acute MI, lateral wall, initial episode of care (HCC) 03/23/2014  . CAD (coronary artery disease)    a. s/p Lat MI 2/16 >> s/p CABG; b. LHC 1/17: mLAD 100, oD1 85%, oLCx 100, OM1 filled by  collats from RPLB2, RPDA stent 95% ISR, L-LAD patent, S-OM1 occluded, S-D1 occluded >> PCI: Promus DES to RPDA  - residual CAD not amenable to PCI (dist vessel/diabetic appearance)   . Carotid stenosis    a. Carotid US 2/19 bilat ICA 1-39%  . Chronic combined systolic and diastolic CHF (congestive heart failure) (HCC)   . Hyperlipidemia 10/16/2014  . Ischemic cardiomyopathy    a. Echo at [facility] 3/16:  EF 35-40%, diff HK, mild LVH, mild LAE, mild TR;  b. Echo 7/16: mild LVH, EF 50%, inf and lat HK-AK, Gr 2 DD, mod LAE, mild RVE, reduced RVSF, trivial TR, PASP 30 mmHg // c. Echo 5/17: moderate LVH, EF 45-50%, anteroseptal HK, grade 1 diastolic dysfunction, aortic sclerosis   . Type 2 diabetes mellitus (HCC) 03/23/2014    Past Surgical History:  Procedure Laterality Date  . CARDIAC CATHETERIZATION N/A 02/24/2015   Procedure: Left Heart Cath and Cors/Grafts Angiography;  Surgeon: Tonny Bollman, MD;  Location: [facility] INVASIVE CV LAB;  Service: Cardiovascular;  Laterality: N/A;  . CARDIAC CATHETERIZATION N/A 02/24/2015   Procedure: Coronary Stent Intervention;  Surgeon: Tonny Bollman, MD;  Location: [facility] INVASIVE CV LAB;  Service: Cardiovascular;  Laterality: N/A;  . CORONARY ARTERY BYPASS GRAFT N/A 03/28/2014   Procedure: CORONARY ARTERY BYPASS GRAFTING (CABG);  Surgeon: Loreli Slot, MD;  Location: [facility] OR;  Service: Open Heart Surgery;  Laterality: N/A;  . LEFT HEART CATHETERIZATION WITH CORONARY ANGIOGRAM N/A 03/23/2014   Procedure: LEFT HEART CATHETERIZATION WITH CORONARY ANGIOGRAM;  Surgeon: Micheline Chapman, MD;  Location: [facility] CATH LAB;  Service: Cardiovascular;  Laterality: N/A;  . PERCUTANEOUS CORONARY STENT INTERVENTION (PCI-S)  02/24/2015   PDA  . TEE WITHOUT CARDIOVERSION N/A 03/28/2014   Procedure: TRANSESOPHAGEAL ECHOCARDIOGRAM (TEE);  Surgeon: Loreli Slot, MD;  Location: [facility] OR;  Service: Open Heart Surgery;  Laterality: N/A;    Current Medications: Current Meds  Medication Sig  .  AMBULATORY NON FORMULARY MEDICATION Take 90 mg by mouth 2 (two) times daily. Medication Name: Brilinta 90 mg BID (TWILIGHT Research Study Provided do not fill)  . AMBULATORY NON FORMULARY MEDICATION Take 81 mg by mouth daily. Medication Name: ASA 81 MG Daily or PLACEBO (TWILIGHT Research study provided)  . atorvastatin (LIPITOR) 40 MG tablet Take 1 tablet (40 mg total) by mouth daily at 6 PM.  . carvedilol (COREG) 25 MG tablet Take 1 tablet (25 mg total) by mouth 2 (two) times daily.  Marland Kitchen Co-Enzyme Q10 200 MG CAPS Take 200 mg by mouth daily.  . empagliflozin (JARDIANCE) 10 MG TABS tablet Take 10 mg by mouth daily.   . furosemide (LASIX) 80 MG tablet Take 1 tablet (80 mg total) by mouth 2 (two) times daily.  . insulin aspart (NOVOLOG) 100 UNIT/ML injection Inject 100 Units into the skin continuous. Patient has insulin pump  . isosorbide mononitrate (IMDUR) 60 MG 24 hr tablet Take 1 tablet (60 mg total) by mouth daily.  Marland Kitchen losartan (COZAAR) 50 MG tablet Take 1 tablet (50 mg total)  by mouth daily.  . Multiple Vitamin (MULTIVITAMIN) tablet Take 1 tablet by mouth daily.  . nitroGLYCERIN (NITROSTAT) 0.4 MG SL tablet Place 1 tablet (0.4 mg total) under the tongue every 5 (five) minutes as needed for chest pain.  Marland Kitchen triamcinolone cream (KENALOG) 0.5 % Apply 1 application topically daily as needed (nose).      Allergies:   Spironolactone   Social History   Social History  . Marital status: Single    Spouse name: N/A  . Number of children: N/A  . Years of education: N/A   Social History Main Topics  . Smoking status: Never Smoker  . Smokeless tobacco: Never Used  . Alcohol use No  . Drug use: No  . Sexual activity: Not Asked   Other Topics Concern  . None   Social History Narrative   The patient is married. He has grown children. He is a lifelong nonsmoker. He works in Production designer, theatre/television/film.     Family History  Problem Relation Age of Onset  . Healthy Mother     No history of CAD  . Healthy Father      No history of CAD  . Heart attack Father   . Diabetes Brother   . Stroke Neg Hx      ROS:   Please see the history of present illness.    ROS All other systems reviewed and are negative.   EKGs/Labs/Other Test Reviewed:    EKG:  EKG is not ordered today.  The ekg ordered today demonstrates n/a  Recent Labs: 05/12/2015: BUN 18; Creat 1.19; Potassium 4.5; Sodium 139   Recent Lipid Panel    Component Value Date/Time   CHOL 144 03/24/2014 0314   TRIG 81 03/24/2014 0314   HDL 47 03/24/2014 0314   CHOLHDL 3.1 03/24/2014 0314   VLDL 16 03/24/2014 0314   LDLCALC 81 03/24/2014 0314     Physical Exam:    VS:  BP (!) 150/88   Pulse 78   Ht 5\' 8"  (1.727 m)   Wt 246 lb 1.9 oz (111.6 kg)   BMI 37.42 kg/m     Wt Readings from Last 3 Encounters:  04/05/16 246 lb 1.9 oz (111.6 kg)  01/01/16 252 lb 6.4 oz (114.5 kg)  06/26/15 239 lb 9.6 oz (108.7 kg)     Physical Exam  Constitutional: He is oriented to person, place, and time. He appears well-developed and well-nourished. No distress.  HENT:  Head: Normocephalic and atraumatic.  Eyes: No scleral icterus.  Neck: Normal range of motion. No JVD present.  Cardiovascular: Normal rate, regular rhythm, S1 normal and S2 normal.   No murmur heard. Pulmonary/Chest: Effort normal and breath sounds normal. He has no wheezes. He has no rhonchi. He has no rales.  Abdominal: Soft. There is no tenderness.  Musculoskeletal: He exhibits no edema.  Neurological: He is alert and oriented to person, place, and time.  Skin: Skin is warm and dry.  Psychiatric: He has a normal mood and affect.    ASSESSMENT:    1. Coronary artery disease involving native coronary artery of native heart without angina pectoris   2. Chronic combined systolic and diastolic congestive heart failure (HCC)   3. Essential hypertension   4. Hyperlipidemia, unspecified hyperlipidemia type    PLAN:    In order of problems listed above:  1. CAD - s/p lateral  STEMI in 2/16 with subsequent CABG.  LHC in 1/17 demonstrated 2/3 grafts occluded and high grade in-stent restenosis in  the RPDA.  RPDA was treated with a DES.  Continue ASA, Brilinta.  He is 1 year out from his PCI.  It would be reasonable to continue him on long term dual antiplatelet Rx (Brilinta 60 mg). He is enrolled in [place].  I will not make any changes at this time as he continues to FU with Research.  Continue beta blocker, ARB, statin, ASA, Brilinta 90 twice a day.   2. Chronic combined systolic and diastolic CHF - 2/2 Ischemic cardiomyopathy.  EF 45-50 with Grade 1 diastolic dysfunction by echo in 5/17.  Volume appears stable.   Continue beta blocker, ARB.  Continue current dose of furosemide.  Arrange FU CMET.  3. Essential hypertension - BP elevated today.  He is being treated for sinusitis.  Reviewed chart.  Previous BPs have been optimal.  Continue to monitor.  4. Hyperlipidemia - Continue statin.  Arrange FU CMET, Lipids.    Medication Adjustments/Labs and Tests Ordered: Current medicines are reviewed at length with the patient today.  Concerns regarding medicines are outlined above.  Medication changes, Labs and Tests ordered today are outlined in the Patient Instructions noted below. Patient Instructions  Medication Instructions:  No changes.  Labwork: In 2 weeks, fasting Lipids, CMET  Testing/Procedures: None   Follow-Up: Dr. Tonny Bollman or Tereso Newcomer, PA-C in 6 months.   Any Other Special Instructions Will Be Listed Below (If Applicable).  If you need a refill on your cardiac medications before your next appointment, please call your pharmacy.    Return in about 6 months (around 10/03/2016) for Routine Follow Up, w/ Dr. Excell Seltzer or Tereso Newcomer, PA-C.   Signed, Tereso Newcomer, PA-C  04/05/2016 5:40 PM    [facility] [address] [place], [place], [place]  [address] Phone: [phone]; Fax: [phone]

## 2016-04-05 NOTE — Patient Instructions (Signed)
Medication Instructions:  No changes.  Labwork: In 2 weeks, fasting Lipids, CMET  Testing/Procedures: None   Follow-Up: Dr. Tonny Bollman or Tereso Newcomer, PA-C in 6 months.   Any Other Special Instructions Will Be Listed Below (If Applicable).  If you need a refill on your cardiac medications before your next appointment, please call your pharmacy.

## 2016-04-19 ENCOUNTER — Other Ambulatory Visit: Payer: BLUE CROSS/BLUE SHIELD | Admitting: *Deleted

## 2016-04-19 DIAGNOSIS — E785 Hyperlipidemia, unspecified: Secondary | ICD-10-CM

## 2016-04-19 DIAGNOSIS — I5042 Chronic combined systolic (congestive) and diastolic (congestive) heart failure: Secondary | ICD-10-CM

## 2016-04-19 DIAGNOSIS — I2510 Atherosclerotic heart disease of native coronary artery without angina pectoris: Secondary | ICD-10-CM

## 2016-04-19 LAB — COMPREHENSIVE METABOLIC PANEL
ALK PHOS: 80 IU/L (ref 39–117)
ALT: 34 IU/L (ref 0–44)
AST: 24 IU/L (ref 0–40)
Albumin/Globulin Ratio: 1.9 (ref 1.2–2.2)
Albumin: 4.1 g/dL (ref 3.6–4.8)
BUN/Creatinine Ratio: 14 (ref 10–24)
BUN: 18 mg/dL (ref 8–27)
Bilirubin Total: 0.5 mg/dL (ref 0.0–1.2)
CHLORIDE: 101 mmol/L (ref 96–106)
CO2: 20 mmol/L (ref 18–29)
Calcium: 9.1 mg/dL (ref 8.6–10.2)
Creatinine, Ser: 1.3 mg/dL — ABNORMAL HIGH (ref 0.76–1.27)
GFR calc Af Amer: 69 mL/min/{1.73_m2} (ref >59)
GFR calc non Af Amer: 59 mL/min/{1.73_m2} — ABNORMAL LOW (ref >59)
GLUCOSE: 232 mg/dL — AB (ref 65–99)
Globulin, Total: 2.2 g/dL (ref 1.5–4.5)
Potassium: 4.2 mmol/L (ref 3.5–5.2)
Sodium: 138 mmol/L (ref 134–144)
TOTAL PROTEIN: 6.3 g/dL (ref 6.0–8.5)

## 2016-04-19 LAB — LIPID PANEL
CHOLESTEROL TOTAL: 209 mg/dL — AB (ref 100–199)
Chol/HDL Ratio: 5.2 ratio units — ABNORMAL HIGH (ref 0.0–5.0)
HDL: 40 mg/dL (ref >39)
LDL Calculated: 131 mg/dL — ABNORMAL HIGH (ref 0–99)
TRIGLYCERIDES: 189 mg/dL — AB (ref 0–149)
VLDL CHOLESTEROL CAL: 38 mg/dL (ref 5–40)

## 2016-04-20 ENCOUNTER — Telehealth: Payer: Self-pay | Admitting: *Deleted

## 2016-04-20 DIAGNOSIS — E785 Hyperlipidemia, unspecified: Secondary | ICD-10-CM

## 2016-04-20 DIAGNOSIS — I5042 Chronic combined systolic (congestive) and diastolic (congestive) heart failure: Secondary | ICD-10-CM

## 2016-04-20 MED ORDER — ATORVASTATIN CALCIUM 80 MG PO TABS: 80.0000 mg | ORAL_TABLET | Freq: Every day | ORAL | 3 refills | Status: DC

## 2016-04-20 MED ORDER — FUROSEMIDE 40 MG PO TABS: 40.0000 mg | ORAL_TABLET | ORAL | 3 refills | Status: DC

## 2016-04-20 NOTE — Telephone Encounter (Signed)
Pt notified of lab results and is agreeable to medications changes. Decrease Lasix to 80 mg in the AM and 40 mg in the PM, increase Atorvastatin 80 mg daily. BMET 04/28/16, FLP/LFT to be done on 06/02/16. Pt verbalized understanding to plan of care and all instructions by phone today. Pt agreeable to f/u with PCP about diabetes.

## 2016-04-28 ENCOUNTER — Telehealth: Payer: Self-pay | Admitting: *Deleted

## 2016-04-28 ENCOUNTER — Other Ambulatory Visit: Payer: BLUE CROSS/BLUE SHIELD | Admitting: *Deleted

## 2016-04-28 DIAGNOSIS — I5042 Chronic combined systolic (congestive) and diastolic (congestive) heart failure: Secondary | ICD-10-CM

## 2016-04-28 LAB — BASIC METABOLIC PANEL
BUN / CREAT RATIO: 16 (ref 10–24)
BUN: 21 mg/dL (ref 8–27)
CO2: 23 mmol/L (ref 18–29)
CREATININE: 1.29 mg/dL — AB (ref 0.76–1.27)
Calcium: 9.1 mg/dL (ref 8.6–10.2)
Chloride: 97 mmol/L (ref 96–106)
GFR calc Af Amer: 69 mL/min/{1.73_m2} (ref >59)
GFR, EST NON AFRICAN AMERICAN: 60 mL/min/{1.73_m2} (ref >59)
GLUCOSE: 170 mg/dL — AB (ref 65–99)
POTASSIUM: 3.7 mmol/L (ref 3.5–5.2)
SODIUM: 141 mmol/L (ref 134–144)

## 2016-04-28 NOTE — Telephone Encounter (Signed)
Pt notified of lab results by phone with verbal understanding to plan of care.  

## 2016-05-09 ENCOUNTER — Other Ambulatory Visit: Payer: Self-pay | Admitting: Cardiovascular Disease

## 2016-06-01 ENCOUNTER — Telehealth: Payer: Self-pay | Admitting: Physician Assistant

## 2016-06-01 ENCOUNTER — Other Ambulatory Visit: Payer: Self-pay

## 2016-06-01 MED ORDER — TICAGRELOR 60 MG PO TABS: 60.0000 mg | ORAL_TABLET | Freq: Two times a day (BID) | ORAL | 11 refills | Status: DC

## 2016-06-01 MED ORDER — ASPIRIN EC 81 MG PO TBEC: 81.0000 mg | DELAYED_RELEASE_TABLET | Freq: Every day | ORAL | 0 refills | Status: DC

## 2016-06-01 MED ORDER — CLOPIDOGREL BISULFATE 75 MG PO TABS: 75.0000 mg | ORAL_TABLET | Freq: Every day | ORAL | 11 refills | Status: DC

## 2016-06-01 NOTE — Telephone Encounter (Signed)
Patient at end of Twilight study. Recommend he remain on ASA and Brilinta. Please have him start Brilinta 60 mg Twice daily (wait to start until he is discharged from Research tomorrow). Also, continue on ASA 81 mg QD.  [facility] [place] [facility] Bril[facility] 60 mg Twice daily  Tereso Newcomer, PA-C    06/01/2016 5:13 PM

## 2016-06-01 NOTE — Telephone Encounter (Signed)
-----   Message from Orrin Brigham, RN sent at 06/01/2016  8:56 AM EDT ----- Regarding: FW: End of treatment TWILIGHT research study Hi,   Mr Terlizzi has his last appointment for research tomorrow @ 8:30 He will no longer receive Brilinta, ASA/Placebo.   I see in last office visit note the need to continued DAPT and the possibility of Brilinta 60 mg BID post TWILIGHT study. Can someone confirm plan and or send needed script for tomorrows start?  Thanks Tammy  ----- Message ----- From: Orrin Brigham, RN Sent: 05/19/2016   9:06 AM To: Iona Coach, RN, Tonny Bollman, MD Subject: End of treatment TWILIGHT research study       Dr. Excell Seltzer,   Mr. Dutta has his last TWILIGHT research appointment on 06/02/16. At this appointment he will no longer be provided Brilinta or ASA/Placebo. Further antiplatelet therapy is at your discretion (including ASA) and a script will need to be sent to his pharmacy.  Thanks, [employer]

## 2016-06-02 ENCOUNTER — Other Ambulatory Visit: Payer: BLUE CROSS/BLUE SHIELD

## 2016-06-02 ENCOUNTER — Encounter: Payer: Self-pay | Admitting: *Deleted

## 2016-06-02 DIAGNOSIS — Z006 Encounter for examination for normal comparison and control in clinical research program: Secondary | ICD-10-CM

## 2016-06-02 DIAGNOSIS — E785 Hyperlipidemia, unspecified: Secondary | ICD-10-CM

## 2016-06-02 LAB — LIPID PANEL
Chol/HDL Ratio: 3.7 ratio (ref 0.0–5.0)
Cholesterol, Total: 167 mg/dL (ref 100–199)
HDL: 45 mg/dL (ref >39)
LDL Calculated: 86 mg/dL (ref 0–99)
Triglycerides: 178 mg/dL — ABNORMAL HIGH (ref 0–149)
VLDL Cholesterol Cal: 36 mg/dL (ref 5–40)

## 2016-06-02 LAB — HEPATIC FUNCTION PANEL
ALT: 30 IU/L (ref 0–44)
AST: 21 IU/L (ref 0–40)
Albumin: 4.4 g/dL (ref 3.6–4.8)
Alkaline Phosphatase: 96 IU/L (ref 39–117)
BILIRUBIN, DIRECT: 0.14 mg/dL (ref 0.00–0.40)
Bilirubin Total: 0.6 mg/dL (ref 0.0–1.2)
TOTAL PROTEIN: 7.2 g/dL (ref 6.0–8.5)

## 2016-06-02 NOTE — Telephone Encounter (Signed)
I s/w pt today and went over the recommendations per Scott Weaver, PA. Pt aware now that Twilight Study is over he will need to stay on ASA 81 mg daily and Brilinta 60 mg twice daily. Pt verbalized understanding to plan of care. [facility] [place] [place] Pt said he went and picked up his cholesterol medication this morning, he said that was all he received. I call the pharmacy today to verify if received Brilinta. Pharmacist informed Brilinta was on back order and just came in. I advised Pharmacist that I instructed pt he could pick up Rx later today and start first dose later this evening.   

## 2016-06-02 NOTE — Progress Notes (Signed)
TWILIGHT Research study month 15 (End of treatment) visit completed. He denies and bleeding events or other adverse events. He has been 84% compliant with ASA/Placebo and 55 % compliant with Brilinta (after pill count) He states he has not missed any pills. I explained to patient that he will start his Plavix 75 mg daily and ASA 81 mg daily tomorrow. He said that he will go pick up new script, [facility]. He has 1 last follow up phone call for the study due before 17/JUL/2018. Questions encouraged and answered. I thanked him for participating in the study.

## 2016-06-02 NOTE — Telephone Encounter (Signed)
I s/w pt today and went over the recommendations per Tereso Newcomer, PA. Pt aware now that Twilight Study is over he will need to stay on ASA 81 mg daily and Brilinta 60 mg twice daily. Pt verbalized understanding to plan of care. [facility] [place] [place] Pt said he went and picked up his cholesterol medication this morning, he said that was all he received. I call the pharmacy today to verify if received Brilinta. Pharmacist informed Marden Noble was on back order and just came in. I advised Pharmacist that I instructed pt he could pick up Rx later today and start first dose later this evening.

## 2016-06-03 ENCOUNTER — Telehealth: Payer: Self-pay

## 2016-06-03 DIAGNOSIS — E785 Hyperlipidemia, unspecified: Secondary | ICD-10-CM

## 2016-06-03 MED ORDER — ROSUVASTATIN CALCIUM 40 MG PO TABS: 40.0000 mg | ORAL_TABLET | Freq: Every day | ORAL | 3 refills | Status: DC

## 2016-06-03 NOTE — Telephone Encounter (Signed)
-----   Message from Beatrice Lecher, [place] sent at 06/02/2016  5:15 PM EDT ----- Please call the patient Liver enzymes are normal. Lipid numbers are good, but the goal LDL is < 70. Confirm that he has been taking Lipitor 80 mg If yes, DC Lipitor and start Crestor 40 mg QD and check Lipids and LFTs in 3 mos. If no, resume Lipitor 80 mg every day and check labs in 3 mos Tereso Newcomer, PA-C    06/02/2016 5:14 PM

## 2016-06-03 NOTE — Telephone Encounter (Signed)
Patient made aware of results. Patient states that he has been taking the lipitor 80 mg daily. Patient advised to stop taking the lipitor and start taking crestor 40 mg daily. Prescription sent to patient preferred pharmacy. Fasting lipids and lfts ordered for 09/02/16. Patient verbalizes understanding.

## 2016-06-06 ENCOUNTER — Other Ambulatory Visit: Payer: Self-pay | Admitting: Cardiovascular Disease

## 2016-08-17 ENCOUNTER — Encounter: Payer: Self-pay | Admitting: *Deleted

## 2016-08-17 DIAGNOSIS — Z006 Encounter for examination for normal comparison and control in clinical research program: Secondary | ICD-10-CM

## 2016-08-17 NOTE — Progress Notes (Signed)
TWILIGHT Research study month 18 telephone follow up completed. Patient continues on Plavix and ASA with no problems. I thanked him for his participation.

## 2016-09-02 ENCOUNTER — Other Ambulatory Visit: Payer: BLUE CROSS/BLUE SHIELD | Admitting: *Deleted

## 2016-09-02 DIAGNOSIS — E785 Hyperlipidemia, unspecified: Secondary | ICD-10-CM

## 2016-09-02 LAB — LIPID PANEL
CHOLESTEROL TOTAL: 123 mg/dL (ref 100–199)
Chol/HDL Ratio: 2.7 ratio (ref 0.0–5.0)
HDL: 45 mg/dL (ref >39)
LDL Calculated: 57 mg/dL (ref 0–99)
TRIGLYCERIDES: 104 mg/dL (ref 0–149)
VLDL Cholesterol Cal: 21 mg/dL (ref 5–40)

## 2016-09-02 LAB — HEPATIC FUNCTION PANEL
ALK PHOS: 84 IU/L (ref 39–117)
ALT: 51 IU/L — AB (ref 0–44)
AST: 34 IU/L (ref 0–40)
Albumin: 4.7 g/dL (ref 3.6–4.8)
Bilirubin Total: 0.6 mg/dL (ref 0.0–1.2)
Bilirubin, Direct: 0.2 mg/dL (ref 0.00–0.40)
TOTAL PROTEIN: 7.2 g/dL (ref 6.0–8.5)

## 2016-09-03 ENCOUNTER — Telehealth: Payer: Self-pay | Admitting: *Deleted

## 2016-09-03 NOTE — Telephone Encounter (Signed)
Pt has been notified of lab results by phone with verbal understanding. Pt thanked me for my call today. Pt aware to continue on current Tx plan. Pt asked for a copy of his med list be sent to him . I verified pt address.

## 2016-09-03 NOTE — Telephone Encounter (Signed)
-----   Message from Beatrice Lecher, [place] sent at 09/03/2016  9:07 AM EDT ----- Please call the patient Lipids are at goal. Liver enzymes are ok Continue with current treatment plan. Tereso Newcomer, PA-C   09/03/2016 9:06 AM

## 2016-09-27 ENCOUNTER — Telehealth: Payer: Self-pay | Admitting: Cardiovascular Disease

## 2016-09-27 NOTE — Telephone Encounter (Signed)
New message      Pt is going to be flying , he needs a note for him to carry his medication on the airplane.

## 2016-09-27 NOTE — Telephone Encounter (Addendum)
Informed pt that he does not need a note/letter  to carry his personal rx's on the plane. Advised to carry any paperwork he had showing he was a research patient (just in case) Pt wasn't sure so he called to confirm.  He appreciates the call.

## 2016-10-28 ENCOUNTER — Other Ambulatory Visit: Payer: Self-pay | Admitting: Cardiovascular Disease

## 2016-12-01 ENCOUNTER — Other Ambulatory Visit: Payer: Self-pay | Admitting: Cardiovascular Disease

## 2016-12-01 NOTE — Telephone Encounter (Signed)
Medication Detail    Disp Refills Start End   carvedilol (COREG) 25 MG tablet 180 tablet 3 05/10/2016    Sig: TAKE 1 TABLET BY MOUTH TWICE A DAY   Sent to pharmacy as: carvedilol (COREG) 25 MG tablet   E-Prescribing Status: Receipt confirmed by pharmacy (05/10/2016 12:14 PM EDT)   Pharmacy   [id number] [place], [place] - [address] [place] [address] AT [address]

## 2016-12-02 ENCOUNTER — Other Ambulatory Visit: Payer: Self-pay | Admitting: Physician Assistant

## 2016-12-02 MED ORDER — CARVEDILOL 25 MG PO TABS: 25.0000 mg | ORAL_TABLET | Freq: Two times a day (BID) | ORAL | 0 refills | Status: DC

## 2016-12-13 ENCOUNTER — Telehealth: Payer: Self-pay | Admitting: Cardiovascular Disease

## 2016-12-13 NOTE — Telephone Encounter (Signed)
New message     1. What dental office are you calling from? Dr. Rosendo Gros  2. What is your office phone and fax number? [phone] and fax # [phone]  3. What type of procedure is the patient having performed? 3 extractions, IV sedation  4. What date is procedure scheduled or is the patient there now? 11/14  5. What is your question (ex. Antibiotics prior to procedure, holding medication-we need to know how long dentist wants pt to hold med)? Does patient need to hold Plavix and Brilinta

## 2016-12-14 ENCOUNTER — Telehealth: Payer: Self-pay | Admitting: *Deleted

## 2016-12-14 NOTE — Telephone Encounter (Signed)
LMOVM OF PT TO CALL BACK FOR CARDIAC CLEARANCE APPT  TO BE SCHEDULED   CONTACTED DENTAL OFFICE 1 [phone]  ABOUT 11-14 PROCEDURE HAS TO HAVE A  CLEARANCE FIRST.

## 2016-12-14 NOTE — Telephone Encounter (Signed)
    Chart reviewed as part of pre-operative protocol coverage. Because of Curlee Rozzi Salman's past medical history and time since last visit, he/she will require a follow-up visit in order to better assess preoperative cardiovascular risk.   Last seen by Dr. Excell Seltzer 01/01/16.  Pre-op covering staff: - Please schedule appointment and call patient to inform them. - Please contact requesting surgeon's office via preferred method (i.e, phone, fax) to inform them of need for appointment prior to surgery.  Onelia Cadmus, PA  12/14/2016, 3:01 PM

## 2016-12-14 NOTE — Telephone Encounter (Signed)
LMOVM TO CALL BACK TO MAKE APPT. FOR CARDIAC  CLEARANCE FOR   AND DENTAL OFFICE CONTATCED ABOUT NEEDED CARDIOLOGY APPT BEFORE PROCEDURE.

## 2016-12-17 ENCOUNTER — Encounter: Payer: Self-pay | Admitting: Physician Assistant

## 2016-12-17 ENCOUNTER — Ambulatory Visit (INDEPENDENT_AMBULATORY_CARE_PROVIDER_SITE_OTHER): Payer: BLUE CROSS/BLUE SHIELD | Admitting: Physician Assistant

## 2016-12-17 VITALS — BP 122/64 | HR 64 | Ht 68.0 in | Wt 234.4 lb

## 2016-12-17 DIAGNOSIS — Z01810 Encounter for preprocedural cardiovascular examination: Secondary | ICD-10-CM

## 2016-12-17 DIAGNOSIS — E785 Hyperlipidemia, unspecified: Secondary | ICD-10-CM | POA: Diagnosis not present

## 2016-12-17 DIAGNOSIS — I5042 Chronic combined systolic (congestive) and diastolic (congestive) heart failure: Secondary | ICD-10-CM

## 2016-12-17 DIAGNOSIS — I2510 Atherosclerotic heart disease of native coronary artery without angina pectoris: Secondary | ICD-10-CM | POA: Diagnosis not present

## 2016-12-17 DIAGNOSIS — K5900 Constipation, unspecified: Secondary | ICD-10-CM

## 2016-12-17 DIAGNOSIS — I10 Essential (primary) hypertension: Secondary | ICD-10-CM

## 2016-12-17 LAB — BASIC METABOLIC PANEL
BUN / CREAT RATIO: 16 (ref 10–24)
BUN: 18 mg/dL (ref 8–27)
CALCIUM: 9.8 mg/dL (ref 8.6–10.2)
CHLORIDE: 100 mmol/L (ref 96–106)
CO2: 22 mmol/L (ref 20–29)
CREATININE: 1.1 mg/dL (ref 0.76–1.27)
GFR calc Af Amer: 84 mL/min/{1.73_m2} (ref >59)
GFR calc non Af Amer: 73 mL/min/{1.73_m2} (ref >59)
GLUCOSE: 324 mg/dL — AB (ref 65–99)
Potassium: 4.2 mmol/L (ref 3.5–5.2)
Sodium: 138 mmol/L (ref 134–144)

## 2016-12-17 LAB — CBC
HEMATOCRIT: 49.2 % (ref 37.5–51.0)
Hemoglobin: 16.9 g/dL (ref 13.0–17.7)
MCH: 30.2 pg (ref 26.6–33.0)
MCHC: 34.3 g/dL (ref 31.5–35.7)
MCV: 88 fL (ref 79–97)
Platelets: 233 10*3/uL (ref 150–379)
RBC: 5.6 x10E6/uL (ref 4.14–5.80)
RDW: 13.6 % (ref 12.3–15.4)
WBC: 7.7 10*3/uL (ref 3.4–10.8)

## 2016-12-17 MED ORDER — FUROSEMIDE 40 MG PO TABS: 40.0000 mg | ORAL_TABLET | Freq: Every day | ORAL | 11 refills | Status: DC

## 2016-12-17 NOTE — Progress Notes (Signed)
Cardiology Office Note:    Date:  12/17/2016   ID:  Danny [name] Aug 08, 1955, MRN [id number]  PCP:  [name], MD  Cardiologist:  Dr. Tonny Bollman    Referring MD: [name], MD   Chief Complaint  Patient presents with  . Coronary Artery Disease    Follow-up  . Congestive Heart Failure    Follow-up    History of Present Illness:    Danny [name] is a 61 y.o. male with a hx of long-standing diabetes mellitus, CAD s/p CABG in 2/16. He was admitted 03/2014 with acute lateral myocardial infarction. He had evolving ECG changes suspicious for a STEMI. Emergent LHC demonstrated diffuse 3 vessel CAD and mildly reduced LV function. Diagonal vessel was possibly the culprit. He was felt to have completed his infarction and there were no good targets for PCI. Echocardiogram demonstrated EF 50%. He underwent CABG with Dr. Dorris Fetch 03/28/14 (LIMA-LAD, SVG-D1, SVG-OM). At the time of surgery, he was noted to have severe hemorrhagic pericarditis or bloody pericardial effusion. After DC from [facility], he was admitted to [facility] 3/16 with a/c systolic CHF. Echo demonstrated EF 35-40%. FU echo in 7/16 demonstrated EF 50%, moderate diastolic dysfunction.   ETT-myoview in 01/2015 was high risk. LHC in 1/17 demonstrated severe native 3 v CAD with severe stenosis of the RPDA and 2/3 grafts occluded (L-LAD patent). He underwent PCI with DES to the native RPDA. Residual CAD was not amenable to PCI 2/2 diffuse distal vessel disease/diabetic pattern.  Danny [name] was last seen in 03/2016.  He returns for follow-up.  He is here alone.  Since last seen, he denies significant chest discomfort, shortness of breath, syncope, orthopnea, PND or edema.  He does note significant constipation.  He went to the emergency room at [facility] recently.  It sounds like he had to be disimpacted.  He does note dark stools but nothing that sounds like melena.  He needs a dental procedure  soon.  The dentist has requested that he come off of his antiplatelet therapy.  He has decreased carbohydrate intake and has lost weight and has been able to decrease his dose of insulin.  Prior CV studies:   The following studies were reviewed today:  Echo 5/17:  moderate LVH, EF 45-50%, anteroseptal HK, grade 1 diastolic dysfunction, aortic sclerosis   LHC 02/24/15 LAD mid 100%, D1 ostial 85% LCx ostial 100%, Lat OM1 filled by collats from RPLB2 RCA with RPDA stent with 95% ISR L-LAD patent S-OM1 occluded S-D1 occluded PCI: 2.5 x 12 mm Promus DES to RPDA 1. Severe native 3 vessel CAD with total occlusion of the LAD, severe diffuse stenosis of the first diagonal, total occlusion of the left circumflex, and severe stenosis of the right PDA 2. S/P aorto-coronary bypass surgery with continued patency of the LIMA - LAD and total occlusion of the SVG-OM and SVG-diagonal 3. Successful PCI of the right PDA Continue DAPT long-term. Residual CAD is not amenable to PCI secondary to diffuse distal vessel disease/diabetic pattern.  Myoview 02/13/15 This is a high risk study with a prior MI in the basal and mid inferior, inferolateral (and possibly basal anterior) walls with a large area, severe severity ischemia in the basal and mid anterolateral, anterior, apical lateral, anterior, inferior walls and in the true apex. EF 31%  Echo 08/26/14 Mild LVH, EF 50%, inferior and lateral HK-AK, Gr 2 DD, mod LAE, mild RVE, reduced RV function, trivial TR, PASP 30 mmHg  Echo  at [facility] 04/16/14 Mod L Eff, EF 35-40%, Impaired relaxation, Diff HK - Ant and AS best preserved, Mild LVH, Mild LAE, Mild TR  Carotid US 03/26/14 1-39% internal carotid artery stenosis bilaterally  Echo 04/02/14 EF 50%, mild LVH, inferior and inferolateral HK, mild MR, LA upper limits of normal, normal RV function, mild TR  Cardiac cath 03/23/14 LM: Patent.  LAD: Prox 50%, mid 75-80%. D1 trifurcates into 3 small subbranches.  Ostium has 90% stenosis. I LCx: prox 90%, OM1 80% stenosis, OM2 80% stenosis. RCA: PDA 80%  EF: Anterolateral wall is akinetic. EF is 40-45%.   Past Medical History:  Diagnosis Date  . Acute MI, lateral wall, initial episode of care (HCC) 03/23/2014  . CAD (coronary artery disease)    a. s/p Lat MI 2/16 >> s/p CABG; b. LHC 1/17: mLAD 100, oD1 85%, oLCx 100, OM1 filled by collats from RPLB2, RPDA stent 95% ISR, L-LAD patent, S-OM1 occluded, S-D1 occluded >> PCI: Promus DES to RPDA  - residual CAD not amenable to PCI (dist vessel/diabetic appearance)   . Carotid stenosis    a. Carotid US 2/19 bilat ICA 1-39%  . Chronic combined systolic and diastolic CHF (congestive heart failure) (HCC)   . Hyperlipidemia 10/16/2014  . Ischemic cardiomyopathy    a. Echo at [facility] 3/16:  EF 35-40%, diff HK, mild LVH, mild LAE, mild TR;  b. Echo 7/16: mild LVH, EF 50%, inf and lat HK-AK, Gr 2 DD, mod LAE, mild RVE, reduced RVSF, trivial TR, PASP 30 mmHg // c. Echo 5/17: moderate LVH, EF 45-50%, anteroseptal HK, grade 1 diastolic dysfunction, aortic sclerosis   . Type 2 diabetes mellitus (HCC) 03/23/2014    Past Surgical History:  Procedure Laterality Date  . CARDIAC CATHETERIZATION N/A 02/24/2015   Procedure: Left Heart Cath and Cors/Grafts Angiography;  Surgeon: Tonny Bollman, MD;  Location: [facility] INVASIVE CV LAB;  Service: Cardiovascular;  Laterality: N/A;  . CARDIAC CATHETERIZATION N/A 02/24/2015   Procedure: Coronary Stent Intervention;  Surgeon: Tonny Bollman, MD;  Location[facility] INVASIVE CV LAB;  Service: Cardiovascular;  Laterality: N/A;  . CORONARY ARTERY BYPASS GRAFT N/A 03/28/2014   Procedure: CORONARY ARTERY BYPASS GRAFTING (CABG);  Surgeon: Loreli Slot, MD;  Location: [facility] OR;  Service: Open Heart Surgery;  Laterality: N/A;  . LEFT HEART CATHETERIZATION WITH CORONARY ANGIOGRAM N/A 03/23/2014   Procedure: LEFT HEART CATHETERIZATION WITH CORONARY ANGIOGRAM;  Surgeon: Micheline Chapman, MD;  Location: [facility]  CATH LAB;  Service: Cardiovascular;  Laterality: N/A;  . PERCUTANEOUS CORONARY STENT INTERVENTION (PCI-S)  02/24/2015   PDA  . TEE WITHOUT CARDIOVERSION N/A 03/28/2014   Procedure: TRANSESOPHAGEAL ECHOCARDIOGRAM (TEE);  Surgeon: Loreli Slot, MD;  Location: [facility] OR;  Service: Open Heart Surgery;  Laterality: N/A;    Current Medications: Current Meds  Medication Sig  . aspirin EC 81 MG tablet Take 1 tablet (81 mg total) by mouth daily.  . carvedilol (COREG) 25 MG tablet Take 1 tablet (25 mg total) by mouth 2 (two) times daily.  . clopidogrel (PLAVIX) 75 MG tablet Take 1 tablet (75 mg total) by mouth daily.  Marland Kitchen Co-Enzyme Q10 200 MG CAPS Take 200 mg by mouth daily.  . empagliflozin (JARDIANCE) 10 MG TABS tablet Take 10 mg by mouth daily.   . insulin aspart (NOVOLOG) 100 UNIT/ML injection Inject 100 Units into the skin continuous. Patient has insulin pump  . isosorbide mononitrate (IMDUR) 60 MG 24 hr tablet Take 1 tablet (60 mg total) by mouth daily.  Marland Kitchen  losartan (COZAAR) 50 MG tablet Take 1 tablet (50 mg total) by mouth daily.  . Multiple Vitamin (MULTIVITAMIN) tablet Take 1 tablet by mouth daily.  . nitroGLYCERIN (NITROSTAT) 0.4 MG SL tablet Place 1 tablet (0.4 mg total) under the tongue every 5 (five) minutes as needed for chest pain.  Marland Kitchen triamcinolone cream (KENALOG) 0.5 % Apply 1 application topically daily as needed (nose).   . [DISCONTINUED] furosemide (LASIX) 40 MG tablet Take 2 tablets (80 mg) by mouth in the AM and 1 tablet (40 mg) by mouth in the PM.  . [DISCONTINUED] ticagrelor (BRILINTA) 60 MG TABS tablet Take 1 tablet (60 mg total) by mouth 2 (two) times daily.     Allergies:   Spironolactone   Social History  Substance Use Topics  . Smoking status: Never Smoker  . Smokeless tobacco: Never Used  . Alcohol use No     Family Hx: The patient's family history includes Diabetes in his brother; Healthy in his father and mother; Heart attack in his father. There is no history  of Stroke.  ROS:   Please see the history of present illness.    Review of Systems  Gastrointestinal: Positive for constipation.   All other systems reviewed and are negative.   EKGs/Labs/Other Test Reviewed:    EKG:  EKG is  ordered today.  The ekg ordered today demonstrates normal sinus rhythm, heart rate 63, leftward axis, first-degree AV block, PR interval 212 ms, QTC 450 ms, no change from prior tracing  Recent Labs: 04/28/2016: BUN 21; Creatinine, Ser 1.29; Potassium 3.7; Sodium 141 09/02/2016: ALT 51   Recent Lipid Panel Lab Results  Component Value Date/Time   CHOL 123 09/02/2016 08:46 AM   TRIG 104 09/02/2016 08:46 AM   HDL 45 09/02/2016 08:46 AM   CHOLHDL 2.7 09/02/2016 08:46 AM   CHOLHDL 3.1 03/24/2014 03:14 AM   LDLCALC 57 09/02/2016 08:46 AM    Physical Exam:    VS:  BP 122/64   Pulse 64   Ht 5\' 8"  (1.727 m)   Wt 234 lb 6.4 oz (106.3 kg)   SpO2 92%   BMI 35.64 kg/m     Wt Readings from Last 3 Encounters:  12/17/16 234 lb 6.4 oz (106.3 kg)  04/05/16 246 lb 1.9 oz (111.6 kg)  01/01/16 252 lb 6.4 oz (114.5 kg)     Physical Exam  Constitutional: He is oriented to person, place, and time. He appears well-developed and well-nourished. No distress.  HENT:  Head: Normocephalic and atraumatic.  Neck: No JVD present.  Cardiovascular: Normal rate and regular rhythm.   No murmur heard. Pulmonary/Chest: Effort normal. He has no rales.  Abdominal: Soft. There is no tenderness.  Musculoskeletal: He exhibits no edema.  Neurological: He is alert and oriented to person, place, and time.  Skin: Skin is warm and dry.    ASSESSMENT:    1. Coronary artery disease involving native coronary artery of native heart without angina pectoris   2. Chronic combined systolic and diastolic congestive heart failure (HCC)   3. Essential hypertension   4. Hyperlipidemia, unspecified hyperlipidemia type   5. Constipation, unspecified constipation type   6. Preoperative  cardiovascular examination    PLAN:    In order of problems listed above:  1.  Coronary artery disease involving native coronary artery of native heart without angina pectoris History of lateral ST elevation myocardial infarction in February 2016 and subsequent CABG.  In January 2017, he had high-grade in-stent restenosis in the  RPDA which was treated with a drug-eluting stent.  He was enrolled in the TWILIGHT trial and came off study drug earlier this year.  We transitioned him to Brilinta 60 mg twice daily around that time.  However, he somehow received a prescription for Plavix.  He has been taking aspirin, Plavix and Brilinta.  He does not describe any symptoms that sound consistent with bleeding.  However, he has been constipated.  [facility] was seen at [facility] a couple of weeks ago and did have blood work.  I will obtain a CBC and BMET today and requested recent labs [facility].  -DC Brilinta  -Continue Plavix, aspirin, nitrates, ARB, beta-blocker, rosuvastatin  -BMET, CBC today  2.  Chronic combined systolic and diastolic congestive heart failure (HCC)  EF 45-50 with mild diastolic dysfunction by last echocardiogram in May 2017.  His volume is stable.  He describes New York Heart Association class II symptoms.  I am not aware of any of his medications causing constipation.  However, he has changed his diet recently and lost weight.  Question if reducing his Lasix would help.  Therefore, I have asked him to decrease his Lasix to 40 mg daily.  3.  Essential hypertension  The patient's blood pressure is controlled on his current regimen.  Continue current therapy.   4.  Hyperlipidemia, unspecified hyperlipidemia type LDL optimal on most recent lab work.  Continue current Rx.    5.  Constipation, unspecified constipation type  Etiology not clear.  I have asked him to follow-up with primary care.  We will reduce his Lasix as outlined above.  6.  Preoperative cardiovascular  examination As far as his dental procedure goes, he does not require any further cardiovascular testing and may proceed at acceptable risk.  If needed, he may hold his Plavix for 7 days prior to the procedure and resume it postoperatively when felt to be safe from a bleeding standpoint.  However, he should NOT stop taking aspirin.   Dispo:  Return in about 6 months (around 06/16/2017) for Routine Follow Up, w/ Dr. Excell Seltzerooper.   Medication Adjustments/Labs and Tests Ordered: Current medicines are reviewed at length with the patient today.  Concerns regarding medicines are outlined above.  Tests Ordered: Orders Placed This Encounter  Procedures  . Basic metabolic panel  . CBC  . EKG 12-Lead   Medication Changes: Meds ordered this encounter  Medications  . furosemide (LASIX) 40 MG tablet    Sig: Take 1 tablet (40 mg total) by mouth daily.    Dispense:  30 tablet    Refill:  11    Order Specific Question:   Supervising Provider    Answer:   Verne CarrowMCALHANY, CHRISTOPHER D [[id number]]    Signed, Tereso NewcomerScott Weaver, PA-C  12/17/2016 2:07 PM    [facility] [address], [place], [place]  [address] Phone: [phone]) [phone]; Fax: [phone]) [phone]

## 2016-12-17 NOTE — Patient Instructions (Addendum)
Medication Instructions:  1. STOP taking Brilinta.  2. If the dentist needs you to hold the Plavix for your procedure, you can hold it for 7 days, and resume it after the procedure as soon as the dentist says it is ok.  3. DO NOT stop your Aspirin.   4. Decrease Lasix (Furosemide) to 40 mg Once daily   Labwork: Today - BMET, CBC   Testing/Procedures: None   Follow-Up: Dr. Tonny Bollman in 6 months   Any Other Special Instructions Will Be Listed Below (If Applicable).  If you need a refill on your cardiac medications before your next appointment, please call your pharmacy.

## 2017-01-13 ENCOUNTER — Other Ambulatory Visit: Payer: Self-pay | Admitting: Physician Assistant

## 2017-01-13 MED ORDER — ROSUVASTATIN CALCIUM 40 MG PO TABS: 40.0000 mg | ORAL_TABLET | Freq: Every day | ORAL | 3 refills | Status: DC

## 2017-01-25 ENCOUNTER — Other Ambulatory Visit: Payer: Self-pay | Admitting: Cardiovascular Disease

## 2017-02-28 MED ORDER — ROSUVASTATIN CALCIUM 40 MG PO TABS: 40.0000 mg | ORAL_TABLET | Freq: Every day | ORAL | 3 refills | Status: DC

## 2017-02-28 MED ORDER — CLOPIDOGREL BISULFATE 75 MG PO TABS: 75.0000 mg | ORAL_TABLET | Freq: Every day | ORAL | 3 refills | Status: DC

## 2017-02-28 NOTE — Addendum Note (Signed)
Addended by: Demetrios Loll on: 02/28/2017 09:05 AM   Modules accepted: Orders

## 2017-02-28 NOTE — Addendum Note (Signed)
Addended by: Demetrios Loll on: 02/28/2017 09:04 AM   Modules accepted: Orders

## 2017-03-01 ENCOUNTER — Other Ambulatory Visit: Payer: Self-pay | Admitting: *Deleted

## 2017-03-01 MED ORDER — CARVEDILOL 25 MG PO TABS: 25.0000 mg | ORAL_TABLET | Freq: Two times a day (BID) | ORAL | 2 refills | Status: DC

## 2017-03-19 ENCOUNTER — Other Ambulatory Visit: Payer: Self-pay | Admitting: Cardiovascular Disease

## 2017-03-21 DIAGNOSIS — Z7902 Long term (current) use of antithrombotics/antiplatelets: Secondary | ICD-10-CM | POA: Diagnosis not present

## 2017-03-21 DIAGNOSIS — I2510 Atherosclerotic heart disease of native coronary artery without angina pectoris: Secondary | ICD-10-CM | POA: Diagnosis not present

## 2017-03-21 DIAGNOSIS — E785 Hyperlipidemia, unspecified: Secondary | ICD-10-CM | POA: Diagnosis not present

## 2017-03-21 DIAGNOSIS — I252 Old myocardial infarction: Secondary | ICD-10-CM | POA: Diagnosis not present

## 2017-03-21 DIAGNOSIS — I509 Heart failure, unspecified: Secondary | ICD-10-CM | POA: Diagnosis not present

## 2017-03-21 DIAGNOSIS — Z951 Presence of aortocoronary bypass graft: Secondary | ICD-10-CM | POA: Diagnosis not present

## 2017-03-21 DIAGNOSIS — I110 Hypertensive heart disease with heart failure: Secondary | ICD-10-CM | POA: Diagnosis not present

## 2017-03-21 DIAGNOSIS — E119 Type 2 diabetes mellitus without complications: Secondary | ICD-10-CM | POA: Diagnosis not present

## 2017-03-21 DIAGNOSIS — K5641 Fecal impaction: Secondary | ICD-10-CM | POA: Diagnosis not present

## 2017-03-25 DIAGNOSIS — K5900 Constipation, unspecified: Secondary | ICD-10-CM | POA: Diagnosis not present

## 2017-03-28 DIAGNOSIS — K5909 Other constipation: Secondary | ICD-10-CM | POA: Diagnosis not present

## 2017-03-28 DIAGNOSIS — Z1211 Encounter for screening for malignant neoplasm of colon: Secondary | ICD-10-CM | POA: Diagnosis not present

## 2017-03-29 DIAGNOSIS — Z6838 Body mass index (BMI) 38.0-38.9, adult: Secondary | ICD-10-CM | POA: Diagnosis not present

## 2017-03-29 DIAGNOSIS — E785 Hyperlipidemia, unspecified: Secondary | ICD-10-CM | POA: Diagnosis not present

## 2017-03-29 DIAGNOSIS — Z951 Presence of aortocoronary bypass graft: Secondary | ICD-10-CM | POA: Diagnosis not present

## 2017-03-29 DIAGNOSIS — E1059 Type 1 diabetes mellitus with other circulatory complications: Secondary | ICD-10-CM | POA: Diagnosis not present

## 2017-03-29 DIAGNOSIS — E1065 Type 1 diabetes mellitus with hyperglycemia: Secondary | ICD-10-CM | POA: Diagnosis not present

## 2017-03-29 DIAGNOSIS — Z4681 Encounter for fitting and adjustment of insulin pump: Secondary | ICD-10-CM | POA: Diagnosis not present

## 2017-03-29 DIAGNOSIS — H3500 Unspecified background retinopathy: Secondary | ICD-10-CM | POA: Diagnosis not present

## 2017-03-30 ENCOUNTER — Telehealth: Payer: Self-pay

## 2017-03-30 DIAGNOSIS — E113512 Type 2 diabetes mellitus with proliferative diabetic retinopathy with macular edema, left eye: Secondary | ICD-10-CM | POA: Diagnosis not present

## 2017-03-30 NOTE — Telephone Encounter (Signed)
   [facility] Pre-operative Risk Assessment    Request for surgical clearance:  1. What type of surgery is being performed? Colonoscopy  2. When is this surgery scheduled? 05-19-2017    3. Are there any medications that need to be held prior to surgery and how long? Plavix - please advise    4. Practice name and name of physician performing surgery? [facility] - Dr. Gustavo Lah   5. What is your office phone and fax number? Phone: [phone] Fax: [phone]   6. Anesthesia type (None, local, MAC, general) ? Propofol for sedation   Velna Ochs 03/30/2017, 5:15 PM  _________________________________________________________________   (provider comments below)

## 2017-03-31 NOTE — Telephone Encounter (Signed)
   Primary Cardiologist: Tonny Bollman, MD  Chart reviewed as part of pre-operative protocol coverage.   Dr. Excell Seltzer, is it ok to hold Plavix for colonoscopy?  The patent was cleared by Tereso Newcomer 11/16/2016  to hold  his Plavix" for 7 days prior to dental procedure and resume it postoperatively when felt to be safe from a bleeding standpoint.  However, he should NOT stop taking aspirin".    [place], [place] 03/31/2017, 2:21 PM

## 2017-04-01 NOTE — Telephone Encounter (Signed)
   Primary Cardiologist: Tonny Bollman, MD  Chart reviewed as part of pre-operative protocol coverage.  [name] is okay to hold the Plavix for 7 days prior to the procedure.  Resume it postoperatively when safe from a bleeding standpoint.  However, he should not stop taking aspirin.  I will route this recommendation to the requesting party via Epic fax function and remove from pre-op pool.  Please call with questions.  Theodore Demark, PA-C 04/01/2017, 2:44 PM

## 2017-04-01 NOTE — Telephone Encounter (Signed)
This is ok. thx 

## 2017-04-26 DIAGNOSIS — E1065 Type 1 diabetes mellitus with hyperglycemia: Secondary | ICD-10-CM | POA: Diagnosis not present

## 2017-05-03 DIAGNOSIS — E109 Type 1 diabetes mellitus without complications: Secondary | ICD-10-CM | POA: Diagnosis not present

## 2017-05-03 DIAGNOSIS — E1059 Type 1 diabetes mellitus with other circulatory complications: Secondary | ICD-10-CM | POA: Diagnosis not present

## 2017-05-03 DIAGNOSIS — Z6838 Body mass index (BMI) 38.0-38.9, adult: Secondary | ICD-10-CM | POA: Diagnosis not present

## 2017-05-03 DIAGNOSIS — Z951 Presence of aortocoronary bypass graft: Secondary | ICD-10-CM | POA: Diagnosis not present

## 2017-05-03 DIAGNOSIS — Z4801 Encounter for change or removal of surgical wound dressing: Secondary | ICD-10-CM | POA: Diagnosis not present

## 2017-05-03 DIAGNOSIS — E1065 Type 1 diabetes mellitus with hyperglycemia: Secondary | ICD-10-CM | POA: Diagnosis not present

## 2017-05-03 DIAGNOSIS — S31105A Unspecified open wound of abdominal wall, periumbilic region without penetration into peritoneal cavity, initial encounter: Secondary | ICD-10-CM | POA: Diagnosis not present

## 2017-05-03 DIAGNOSIS — E785 Hyperlipidemia, unspecified: Secondary | ICD-10-CM | POA: Diagnosis not present

## 2017-05-03 DIAGNOSIS — Z4681 Encounter for fitting and adjustment of insulin pump: Secondary | ICD-10-CM | POA: Diagnosis not present

## 2017-05-04 DIAGNOSIS — H00011 Hordeolum externum right upper eyelid: Secondary | ICD-10-CM | POA: Diagnosis not present

## 2017-05-04 DIAGNOSIS — H1013 Acute atopic conjunctivitis, bilateral: Secondary | ICD-10-CM | POA: Diagnosis not present

## 2017-05-06 DIAGNOSIS — E109 Type 1 diabetes mellitus without complications: Secondary | ICD-10-CM | POA: Diagnosis not present

## 2017-05-06 DIAGNOSIS — E1059 Type 1 diabetes mellitus with other circulatory complications: Secondary | ICD-10-CM | POA: Diagnosis not present

## 2017-05-06 DIAGNOSIS — S31105A Unspecified open wound of abdominal wall, periumbilic region without penetration into peritoneal cavity, initial encounter: Secondary | ICD-10-CM | POA: Diagnosis not present

## 2017-05-06 DIAGNOSIS — E1065 Type 1 diabetes mellitus with hyperglycemia: Secondary | ICD-10-CM | POA: Diagnosis not present

## 2017-05-19 ENCOUNTER — Encounter: Admission: RE | Payer: Self-pay | Source: Ambulatory Visit

## 2017-05-19 ENCOUNTER — Ambulatory Visit: Admission: RE | Admit: 2017-05-19 | Payer: Medicare HMO | Source: Ambulatory Visit | Admitting: Gastroenterology

## 2017-05-19 SURGERY — COLONOSCOPY WITH PROPOFOL
Anesthesia: General

## 2017-05-23 DIAGNOSIS — H1032 Unspecified acute conjunctivitis, left eye: Secondary | ICD-10-CM | POA: Diagnosis not present

## 2017-06-08 ENCOUNTER — Encounter: Payer: Self-pay | Admitting: Cardiovascular Disease

## 2017-06-16 DIAGNOSIS — J209 Acute bronchitis, unspecified: Secondary | ICD-10-CM | POA: Diagnosis not present

## 2017-06-19 DIAGNOSIS — R05 Cough: Secondary | ICD-10-CM | POA: Diagnosis not present

## 2017-06-19 DIAGNOSIS — J189 Pneumonia, unspecified organism: Secondary | ICD-10-CM | POA: Diagnosis not present

## 2017-06-22 ENCOUNTER — Encounter: Payer: Self-pay | Admitting: Cardiovascular Disease

## 2017-06-22 ENCOUNTER — Ambulatory Visit: Payer: Medicare HMO | Admitting: Cardiovascular Disease

## 2017-06-22 VITALS — BP 114/78 | HR 73 | Ht 68.0 in | Wt 242.0 lb

## 2017-06-22 DIAGNOSIS — I2510 Atherosclerotic heart disease of native coronary artery without angina pectoris: Secondary | ICD-10-CM | POA: Diagnosis not present

## 2017-06-22 DIAGNOSIS — I5042 Chronic combined systolic (congestive) and diastolic (congestive) heart failure: Secondary | ICD-10-CM | POA: Diagnosis not present

## 2017-06-22 NOTE — Progress Notes (Signed)
Cardiology Office Note Date:  06/22/2017   ID:  Champion, Corales [birth year], MRN [id number]  PCP:  [name], MD  Cardiologist:  Tonny Bollman, MD    Chief Complaint  Patient presents with  . Follow-up    CAD     History of Present Illness: Danny [name] is a 62 y.o. male who presents for follow-up of coronary artery disease.  The patient underwent CABG in 2016.  Comorbidities include long-standing diabetes.  He initially presented with an acute lateral MI and was noted to have poor targets for PCI.  He was grafted with a LIMA to LAD, saphenous vein to diagonal, and saphenous vein to obtuse marginal.  He developed hemorrhagic pericarditis after his MI but had no further complications following cardiac surgery.  He developed recurrent angina after bypass surgery and was found to have only patency of his LIMA to LAD graft.  He underwent PCI of the right PDA and his other coronary artery disease was managed medically.  He is here alone today. He's walking around the block at least twice/day but otherwise not exercising regularly. No chest pain, chest pressure, orthopnea, or PND. He does complain of shortness of breath with exertion.  Also complains of generalized fatigue.  He is working on his diet - drinking 64 oz water, low carbohydrate, but hasn't had much success with weight loss yet.   Past Medical History:  Diagnosis Date  . Acute MI, lateral wall, initial episode of care (HCC) 03/23/2014  . CAD (coronary artery disease)    a. s/p Lat MI 2/16 >> s/p CABG; b. LHC 1/17: mLAD 100, oD1 85%, oLCx 100, OM1 filled by collats from RPLB2, RPDA stent 95% ISR, L-LAD patent, S-OM1 occluded, S-D1 occluded >> PCI: Promus DES to RPDA  - residual CAD not amenable to PCI (dist vessel/diabetic appearance)   . Carotid stenosis    a. Carotid US 2/19 bilat ICA 1-39%  . Chronic combined systolic and diastolic CHF (congestive heart failure) (HCC)   . Hyperlipidemia 10/16/2014  .  Ischemic cardiomyopathy    a. Echo at [facility] 3/16:  EF 35-40%, diff HK, mild LVH, mild LAE, mild TR;  b. Echo 7/16: mild LVH, EF 50%, inf and lat HK-AK, Gr 2 DD, mod LAE, mild RVE, reduced RVSF, trivial TR, PASP 30 mmHg // c. Echo 5/17: moderate LVH, EF 45-50%, anteroseptal HK, grade 1 diastolic dysfunction, aortic sclerosis   . Type 2 diabetes mellitus (HCC) 03/23/2014    Past Surgical History:  Procedure Laterality Date  . CARDIAC CATHETERIZATION N/A 02/24/2015   Procedure: Left Heart Cath and Cors/Grafts Angiography;  Surgeon: Tonny Bollman, MD;  Location: [facility] INVASIVE CV LAB;  Service: Cardiovascular;  Laterality: N/A;  . CARDIAC CATHETERIZATION N/A 02/24/2015   Procedure: Coronary Stent Intervention;  Surgeon: Tonny Bollman, MD;  Location: [facility] INVASIVE CV LAB;  Service: Cardiovascular;  Laterality: N/A;  . CORONARY ARTERY BYPASS GRAFT N/A 03/28/2014   Procedure: CORONARY ARTERY BYPASS GRAFTING (CABG);  Surgeon: Loreli Slot, MD;  Location: [facility] OR;  Service: Open Heart Surgery;  Laterality: N/A;  . LEFT HEART CATHETERIZATION WITH CORONARY ANGIOGRAM N/A 03/23/2014   Procedure: LEFT HEART CATHETERIZATION WITH CORONARY ANGIOGRAM;  Surgeon: Micheline Chapman, MD;  Location: [facility] CATH LAB;  Service: Cardiovascular;  Laterality: N/A;  . PERCUTANEOUS CORONARY STENT INTERVENTION (PCI-S)  02/24/2015   PDA  . TEE WITHOUT CARDIOVERSION N/A 03/28/2014   Procedure: TRANSESOPHAGEAL ECHOCARDIOGRAM (TEE);  Surgeon: Loreli Slot, MD;  Location: [facility]  OR;  Service: Open Heart Surgery;  Laterality: N/A;    Current Outpatient Medications  Medication Sig Dispense Refill  . aspirin EC 81 MG tablet Take 1 tablet (81 mg total) by mouth daily. 1 tablet 0  . atorvastatin (LIPITOR) 80 MG tablet Take 1 tablet by mouth daily.    . canagliflozin (INVOKANA) 100 MG TABS tablet Take 1 tablet by mouth daily.    . carvedilol (COREG) 25 MG tablet Take 1 tablet (25 mg total) by mouth 2 (two) times daily. 180 tablet 2  .  clopidogrel (PLAVIX) 75 MG tablet Take 1 tablet (75 mg total) by mouth daily. 90 tablet 3  . Co-Enzyme Q10 200 MG CAPS Take 200 mg by mouth daily.    Marland Kitchen docusate sodium (COLACE) 100 MG capsule Take 1 capsule by mouth 2 (two) times daily.    . empagliflozin (JARDIANCE) 10 MG TABS tablet Take 10 mg by mouth daily.     Marland Kitchen erythromycin ophthalmic ointment Place 1 application into the left eye as needed.    . furosemide (LASIX) 40 MG tablet Take 1 tablet (40 mg total) by mouth daily. 90 tablet 2  . glucagon (GLUCAGON EMERGENCY) 1 MG injection Inject 1 mg into the skin daily.    . insulin aspart (NOVOLOG) 100 UNIT/ML injection Inject 100 Units into the skin continuous. Patient has insulin pump    . isosorbide mononitrate (IMDUR) 60 MG 24 hr tablet Take 1 tablet (60 mg total) by mouth daily. 90 tablet 3  . levofloxacin (LEVAQUIN) 750 MG tablet Take 1 tablet by mouth daily.  0  . lisinopril-hydrochlorothiazide (PRINZIDE,ZESTORETIC) 20-25 MG tablet Take 1 tablet by mouth daily.    Marland Kitchen losartan (COZAAR) 50 MG tablet Take 1 tablet (50 mg total) by mouth daily. 90 tablet 3  . moxifloxacin (VIGAMOX) 0.5 % ophthalmic solution Place 1 drop into both eyes 4 (four) times daily.    . Multiple Vitamin (MULTIVITAMIN) tablet Take 1 tablet by mouth daily.    . nitroGLYCERIN (NITROSTAT) 0.4 MG SL tablet Place 1 tablet (0.4 mg total) under the tongue every 5 (five) minutes as needed for chest pain. 25 tablet 2  . rosuvastatin (CRESTOR) 40 MG tablet Take 1 tablet (40 mg total) by mouth daily. 90 tablet 3  . sacubitril-valsartan (ENTRESTO) 24-26 MG Take 1 tablet by mouth 2 (two) times daily.    . ticagrelor (BRILINTA) 60 MG TABS tablet Take 1 tablet by mouth 2 (two) times daily.    Marland Kitchen triamcinolone cream (KENALOG) 0.5 % Apply 1 application topically daily as needed (nose).     . benzonatate (TESSALON) 100 MG capsule Take 1 capsule by mouth daily.  0   No current facility-administered medications for this visit.      Allergies:   Spironolactone   Social History:  The patient  reports that he has never smoked. He has never used smokeless tobacco. He reports that he does not drink alcohol or use drugs.   Family History:  The patient's family history includes Diabetes in his brother; Healthy in his father and mother; Heart attack in his father.   ROS:  Please see the history of present illness.  Otherwise, review of systems is positive for weight gain, constipation.  All other systems are reviewed and negative.   PHYSICAL EXAM: VS:  BP 114/78   Pulse 73   Ht 5\' 8"  (1.727 m)   Wt 242 lb (109.8 kg)   SpO2 93%   BMI 36.80 kg/m  , BMI  Body mass index is 36.8 kg/m. GEN: Well nourished, well developed, obese male in no acute distress  HEENT: normal  Neck: no JVD, no masses. No carotid bruits Cardiac: RRR without murmur or gallop                Respiratory:  clear to auscultation bilaterally, normal work of breathing GI: soft, nontender, nondistended, + BS MS: no deformity or atrophy  Ext: no pretibial edema, pedal pulses 2+= bilaterally Skin: warm and dry, no rash Neuro:  Strength and sensation are intact Psych: euthymic mood, full affect  EKG:  EKG is not ordered today.  Recent Labs: 09/02/2016: ALT 51 12/17/2016: BUN 18; Creatinine, Ser 1.10; Hemoglobin 16.9; Platelets 233; Potassium 4.2; Sodium 138   Lipid Panel     Component Value Date/Time   CHOL 123 09/02/2016 0846   TRIG 104 09/02/2016 0846   HDL 45 09/02/2016 0846   CHOLHDL 2.7 09/02/2016 0846   CHOLHDL 3.1 03/24/2014 0314   VLDL 16 03/24/2014 0314   LDLCALC 57 09/02/2016 0846      Wt Readings from Last 3 Encounters:  06/22/17 242 lb (109.8 kg)  12/17/16 234 lb 6.4 oz (106.3 kg)  04/05/16 246 lb 1.9 oz (111.6 kg)     ASSESSMENT AND PLAN: 1.  Coronary artery disease, native vessel: The patient appears to be stable without anginal symptoms.  His medical program is reviewed and will be continued without change.  He is  tolerating a beta-blocker, ARB, statin drug, and dual antiplatelet therapy.    2.  Chronic combined systolic and diastolic heart failure.  Last echo study demonstrated an LVEF of 45 to 50%. Volume appears stable.  We discussed weight loss at length today.  3.  Hypertension: Blood pressure controlled  4.  Hyperlipidemia: Last lipids reviewed and at goal.  5.  Type 2 diabetes: Poorly controlled.  Working on dietary changes and lifestyle modification.  We discussed specific measures and increasing his exercise.  He is treated with insulin and followed by his primary physician.  Current medicines are reviewed with the patient today.  The patient does not have concerns regarding medicines.  Labs/ tests ordered today include:  No orders of the defined types were placed in this encounter.   Disposition:   FU 6 months with Danny Newcomer, PA-C.   Signed, Tonny Bollman, MD  06/22/2017 12:58 PM    [facility] [address] [place], [place], [place]  [address] Phone: [phone]; Fax: [phone]

## 2017-06-22 NOTE — Patient Instructions (Signed)
Medication Instructions:  Your provider recommends that you continue on your current medications as directed. Please refer to the Current Medication list given to you today.    Labwork: None  Testing/Procedures: None   Follow-Up: Your provider wants you to follow-up in: 6 months with Scott Weaver, PA. You will receive a reminder letter in the mail two months in advance. If you don't receive a letter, please call our office to schedule the follow-up appointment.    Your provider wants you to follow-up in: 1 year with Dr. Cooper. You will receive a reminder letter in the mail two months in advance. If you don't receive a letter, please call our office to schedule the follow-up appointment.    Any Other Special Instructions Will Be Listed Below (If Applicable).     If you need a refill on your cardiac medications before your next appointment, please call your pharmacy.   

## 2017-06-30 ENCOUNTER — Telehealth: Payer: Self-pay | Admitting: Cardiovascular Disease

## 2017-06-30 DIAGNOSIS — R05 Cough: Secondary | ICD-10-CM | POA: Diagnosis not present

## 2017-06-30 DIAGNOSIS — R739 Hyperglycemia, unspecified: Secondary | ICD-10-CM | POA: Diagnosis not present

## 2017-06-30 DIAGNOSIS — I5042 Chronic combined systolic (congestive) and diastolic (congestive) heart failure: Secondary | ICD-10-CM

## 2017-06-30 DIAGNOSIS — I517 Cardiomegaly: Secondary | ICD-10-CM | POA: Diagnosis not present

## 2017-06-30 NOTE — Telephone Encounter (Signed)
Patient called per another physician to let Dr. Excell Seltzer know the patient's heart showed enlarged on chest xray and he thinks he may need an echo. The patient has been seen several times recently at Urgent Care for persistent cough and congestion. He has taken antibiotics and had several chest xrays. The most recent chest xray showed enlarged heart. He said the other doctor told him he thinks heart problems may be causing his congestion since abx are not getting rid of it.  Will request records from [facility].  To Dr. Excell Seltzer for echo recommendations.

## 2017-06-30 NOTE — Telephone Encounter (Signed)
New message    Patient requesting order for echo after seeing a physician in [facility]. Patient is concerned an enlarged heart.   Pt c/o swelling: STAT is pt has developed SOB within 24 hours  1) How much weight have you gained and in what time span? N/A  2) If swelling, where is the swelling located? ANKLES  3) Are you currently taking a fluid pill? YES  4) Are you currently SOB? SOB on exertion  5) Do you have a log of your daily weights (if so, list)? 241  6) Have you gained 3 pounds in a day or 5 pounds in a week? N/A  7) Have you traveled recently? NO

## 2017-07-06 NOTE — Telephone Encounter (Signed)
Agree with checking echo. thanks

## 2017-07-06 NOTE — Telephone Encounter (Signed)
ECHO ordered and scheduled for tomorrow. Patient agrees with treatment plan.

## 2017-07-07 ENCOUNTER — Other Ambulatory Visit: Payer: Self-pay

## 2017-07-07 ENCOUNTER — Ambulatory Visit (HOSPITAL_COMMUNITY): Payer: Medicare HMO | Attending: Cardiology

## 2017-07-07 DIAGNOSIS — I110 Hypertensive heart disease with heart failure: Secondary | ICD-10-CM | POA: Insufficient documentation

## 2017-07-07 DIAGNOSIS — I5042 Chronic combined systolic (congestive) and diastolic (congestive) heart failure: Secondary | ICD-10-CM | POA: Diagnosis not present

## 2017-07-07 DIAGNOSIS — E119 Type 2 diabetes mellitus without complications: Secondary | ICD-10-CM | POA: Diagnosis not present

## 2017-08-21 ENCOUNTER — Other Ambulatory Visit: Payer: Self-pay | Admitting: Physician Assistant

## 2017-08-23 NOTE — Telephone Encounter (Signed)
Pt's pharmacy is requesting a refill on Rosuvastatin. Pt also has atorvastatin on med list as well. Does pt supposed to be taking both medications? Please address

## 2017-08-24 DIAGNOSIS — E1069 Type 1 diabetes mellitus with other specified complication: Secondary | ICD-10-CM | POA: Diagnosis not present

## 2017-08-24 DIAGNOSIS — I10 Essential (primary) hypertension: Secondary | ICD-10-CM | POA: Diagnosis not present

## 2017-08-24 DIAGNOSIS — E119 Type 2 diabetes mellitus without complications: Secondary | ICD-10-CM | POA: Diagnosis not present

## 2017-08-24 DIAGNOSIS — R809 Proteinuria, unspecified: Secondary | ICD-10-CM | POA: Diagnosis not present

## 2017-08-24 DIAGNOSIS — E785 Hyperlipidemia, unspecified: Secondary | ICD-10-CM | POA: Diagnosis not present

## 2017-08-24 DIAGNOSIS — E1029 Type 1 diabetes mellitus with other diabetic kidney complication: Secondary | ICD-10-CM | POA: Diagnosis not present

## 2017-08-24 DIAGNOSIS — E1159 Type 2 diabetes mellitus with other circulatory complications: Secondary | ICD-10-CM | POA: Diagnosis not present

## 2017-08-24 DIAGNOSIS — Z794 Long term (current) use of insulin: Secondary | ICD-10-CM | POA: Diagnosis not present

## 2017-08-24 NOTE — Telephone Encounter (Signed)
Med list updated and refill called in.

## 2017-08-24 NOTE — Telephone Encounter (Signed)
Called pt and pt stated that he is taking Crestor 40 mg tablet. If you could please correct this in the pt's chart and sent the correct medication, pt needs a refill. Thank you

## 2017-09-28 DIAGNOSIS — E113591 Type 2 diabetes mellitus with proliferative diabetic retinopathy without macular edema, right eye: Secondary | ICD-10-CM | POA: Diagnosis not present

## 2017-09-29 DIAGNOSIS — E113591 Type 2 diabetes mellitus with proliferative diabetic retinopathy without macular edema, right eye: Secondary | ICD-10-CM | POA: Diagnosis not present

## 2017-10-16 ENCOUNTER — Other Ambulatory Visit: Payer: Self-pay | Admitting: Cardiovascular Disease

## 2017-10-27 DIAGNOSIS — E113591 Type 2 diabetes mellitus with proliferative diabetic retinopathy without macular edema, right eye: Secondary | ICD-10-CM | POA: Diagnosis not present

## 2017-11-22 DIAGNOSIS — E113591 Type 2 diabetes mellitus with proliferative diabetic retinopathy without macular edema, right eye: Secondary | ICD-10-CM | POA: Diagnosis not present

## 2017-11-24 DIAGNOSIS — E785 Hyperlipidemia, unspecified: Secondary | ICD-10-CM | POA: Diagnosis not present

## 2017-11-24 DIAGNOSIS — E1069 Type 1 diabetes mellitus with other specified complication: Secondary | ICD-10-CM | POA: Diagnosis not present

## 2017-11-24 DIAGNOSIS — E1159 Type 2 diabetes mellitus with other circulatory complications: Secondary | ICD-10-CM | POA: Diagnosis not present

## 2017-11-24 DIAGNOSIS — E1029 Type 1 diabetes mellitus with other diabetic kidney complication: Secondary | ICD-10-CM | POA: Diagnosis not present

## 2017-11-24 DIAGNOSIS — I10 Essential (primary) hypertension: Secondary | ICD-10-CM | POA: Diagnosis not present

## 2017-11-24 DIAGNOSIS — R809 Proteinuria, unspecified: Secondary | ICD-10-CM | POA: Diagnosis not present

## 2017-11-26 ENCOUNTER — Other Ambulatory Visit: Payer: Self-pay | Admitting: Cardiovascular Disease

## 2017-12-08 DIAGNOSIS — E113591 Type 2 diabetes mellitus with proliferative diabetic retinopathy without macular edema, right eye: Secondary | ICD-10-CM | POA: Diagnosis not present

## 2017-12-11 ENCOUNTER — Other Ambulatory Visit: Payer: Self-pay | Admitting: Physician Assistant

## 2017-12-22 DIAGNOSIS — E113512 Type 2 diabetes mellitus with proliferative diabetic retinopathy with macular edema, left eye: Secondary | ICD-10-CM | POA: Diagnosis not present

## 2018-02-06 DIAGNOSIS — L282 Other prurigo: Secondary | ICD-10-CM | POA: Diagnosis not present

## 2018-04-17 ENCOUNTER — Other Ambulatory Visit (HOSPITAL_COMMUNITY): Payer: Self-pay

## 2018-04-18 ENCOUNTER — Other Ambulatory Visit (HOSPITAL_COMMUNITY): Payer: Self-pay

## 2018-06-16 ENCOUNTER — Telehealth: Payer: Self-pay | Admitting: Physician Assistant

## 2018-06-16 NOTE — Telephone Encounter (Signed)
[email or web address] visit on 06/23/2018     Phone Call to obtain consent   -  06/16/2018        Virtual Visit Pre-Appointment Phone Call  "(Name), I am calling you today to discuss your upcoming appointment. We are currently trying to limit exposure to the virus that causes COVID-19 by seeing patients at home rather than in the office."  1. "What is the BEST phone number to call the day of the visit?" - include this in appointment notes  2. Do you have or have access to (through a family member/friend) a smartphone with video capability that we can use for your visit?" a. If yes - list this number in appt notes as cell (if different from BEST phone #) and list the appointment type as a VIDEO visit in appointment notes b. If no - list the appointment type as a PHONE visit in appointment notes  3. Confirm consent - "In the setting of the current Covid19 crisis, you are scheduled for a (phone or video) visit with your provider on (date) at (time).  Just as we do with many in-office visits, in order for you to participate in this visit, we must obtain consent.  If you'd like, I can send this to your mychart (if signed up) or email for you to review.  Otherwise, I can obtain your verbal consent now.  All virtual visits are billed to your insurance company just like a normal visit would be.  By agreeing to a virtual visit, we'd like you to understand that the technology does not allow for your provider to perform an examination, and thus may limit your provider's ability to fully assess your condition. If your provider identifies any concerns that need to be evaluated in person, we will make arrangements to do so.  Finally, though the technology is pretty good, we cannot assure that it will always work on either your or our end, and in the setting of a video visit, we may have to convert it to a phone-only visit.  In either situation, we cannot ensure that we have a secure connection.  Are you  willing to proceed?" STAFF: Did the patient verbally acknowledge consent to telehealth visit? Document YES/NO here: YES  4. Advise patient to be prepared - "Two hours prior to your appointment, go ahead and check your blood pressure, pulse, oxygen saturation, and your weight (if you have the equipment to check those) and write them all down. When your visit starts, your provider will ask you for this information. If you have an Apple Watch or Kardia device, please plan to have heart rate information ready on the day of your appointment. Please have a pen and paper handy nearby the day of the visit as well."  5. Give patient instructions for MyChart download to smartphone OR [email or web address] as below if video visit (depending on what platform provider is using)  6. Inform patient they will receive a phone call 15 minutes prior to their appointment time (may be from unknown caller ID) so they should be prepared to answer    TELEPHONE CALL NOTE  Danny [name] has been deemed a candidate for a follow-up tele-health visit to limit community exposure during the Covid-19 pandemic. I spoke with the patient via phone to ensure availability of phone/video source, confirm preferred email & phone number, and discuss instructions and expectations.  I reminded Danny [name] to be prepared with any vital sign and/or heart rhythm information  that could potentially be obtained via home monitoring, at the time of his visit. I reminded Danny [name] to expect a phone call prior to his visit.  Birdie Riddle 06/16/2018 4:33 PM   INSTRUCTIONS FOR DOWNLOADING THE MYCHART APP TO SMARTPHONE  - The patient must first make sure to have activated MyChart and know their login information - If Apple, go to [employer] and type in MyChart in the search bar and download the app. If Android, ask patient to go to [employer] and type in [place] in the search bar and download the app. The app is free  but as with any other app downloads, their phone may require them to verify saved payment information or Apple/Android password.  - The patient will need to then log into the app with their MyChart username and password, and select [employer] as their healthcare provider to link the account. When it is time for your visit, go to the MyChart app, find appointments, and click Begin Video Visit. Be sure to Select Allow for your device to access the Microphone and Camera for your visit. You will then be connected, and your provider will be with you shortly.  **If they have any issues connecting, or need assistance please contact MyChart service desk [phone])**  **If using a computer, in order to ensure the best quality for their visit they will need to use either of the following Internet Browsers: [employer] or Google Chrome**  IF USING DOXIMITY or DOXY.ME - The patient will receive a link just prior to their visit by text.     FULL LENGTH CONSENT FOR TELE-HEALTH VISIT   I hereby voluntarily request, consent and authorize [facility] and its employed or contracted physicians, physician assistants, nurse practitioners or other licensed health care professionals (the Practitioner), to provide me with telemedicine health care services (the Services") as deemed necessary by the treating Practitioner. I acknowledge and consent to receive the Services by the Practitioner via telemedicine. I understand that the telemedicine visit will involve communicating with the Practitioner through live audiovisual communication technology and the disclosure of certain medical information by electronic transmission. I acknowledge that I have been given the opportunity to request an in-person assessment or other available alternative prior to the telemedicine visit and am voluntarily participating in the telemedicine visit.  I understand that I have the right to withhold or withdraw my consent  to the use of telemedicine in the course of my care at any time, without affecting my right to future care or treatment, and that the Practitioner or I may terminate the telemedicine visit at any time. I understand that I have the right to inspect all information obtained and/or recorded in the course of the telemedicine visit and may receive copies of available information for a reasonable fee.  I understand that some of the potential risks of receiving the Services via telemedicine include:   Delay or interruption in medical evaluation due to technological equipment failure or disruption;  Information transmitted may not be sufficient (e.g. poor resolution of images) to allow for appropriate medical decision making by the Practitioner; and/or   In rare instances, security protocols could fail, causing a breach of personal health information.  Furthermore, I acknowledge that it is my responsibility to provide information about my medical history, conditions and care that is complete and accurate to the best of my ability. I acknowledge that Practitioner's advice, recommendations, and/or decision may be based on factors not within  their control, such as incomplete or inaccurate data provided by me or distortions of diagnostic images or specimens that may result from electronic transmissions. I understand that the practice of medicine is not an exact science and that Practitioner makes no warranties or guarantees regarding treatment outcomes. I acknowledge that I will receive a copy of this consent concurrently upon execution via email to the email address I last provided but may also request a printed copy by calling the office of [facility].    I understand that my insurance will be billed for this visit.   I have read or had this consent read to me.  I understand the contents of this consent, which adequately explains the benefits and risks of the Services being provided via telemedicine.   I  have been provided ample opportunity to ask questions regarding this consent and the Services and have had my questions answered to my satisfaction.  I give my informed consent for the services to be provided through the use of telemedicine in my medical care  By participating in this telemedicine visit I agree to the above.

## 2018-06-22 NOTE — Progress Notes (Signed)
Virtual Visit via Telephone Note   This visit type was conducted due to national recommendations for restrictions regarding the COVID-19 Pandemic (e.g. social distancing) in an effort to limit this patient's exposure and mitigate transmission in our community.  Due to his co-morbid illnesses, this patient is at least at moderate risk for complications without adequate follow up.  This format is felt to be most appropriate for this patient at this time.  The patient did not have access to video technology/had technical difficulties with video requiring transitioning to audio format only (telephone).  All issues noted in this document were discussed and addressed.  No physical exam could be performed with this format.  Please refer to the patient's chart for his consent to telehealth for [facility].   Date:  06/23/2018   ID:  Danny [name] 08-28-1955, MRN [id number]  Patient Location: Home Provider Location: Home  PCP:  Danny Hartigan, MD  Cardiologist:  Sherren Mocha, MD   Electrophysiologist:  None   Evaluation Performed:  Follow-Up Visit  Chief Complaint:  FU on CAD, CHF  History of Present Illness:    Danny [name] is a 63 y.o. male with coronary artery disease s/p acute lateral MI in 2016 followed by CABG (complicated by post op hemorrhagic pericarditis vs bloody pericardial effusion), diabetes, combined systolic and diastolic CHF.  Cardiac Catheterization in 02/2015 demonstrated severe native 3 v CAD with severe stenosis of the RPDA and 2/3 grafts occluded (L-LAD patent). He underwent PCI with DES to the native RPDA. Residual CAD was not amenable to PCI 2/2 diffuse distal vessel disease/diabetic pattern. he was last seen by Dr. Burt Knack in 06/2017.  A FU echo in 06/2017 demonstrated EF 50-55.  Today, he notes he is doing well.  He has not had chest discomfort.  He has dyspnea with exertion but this is stable without significant change.  He describes NYHA 2  symptoms.  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  He has not had orthopnea, paroxysmal internal dyspnea, lower extremity swelling or syncope.  The patient does not have symptoms concerning for COVID-19 infection (fever, chills, cough, or new shortness of breath).    Past Medical History:  Diagnosis Date   Acute MI, lateral wall, initial episode of care ([name]) 03/23/2014   CAD (coronary artery disease)    a. s/p Lat MI 2/16 >> s/p CABG; b. LHC 1/17: mLAD 100, oD1 85%, oLCx 100, OM1 filled by collats from RPLB2, RPDA stent 95% ISR, L-LAD patent, S-OM1 occluded, S-D1 occluded >> PCI: Promus DES to RPDA  - residual CAD not amenable to PCI (dist vessel/diabetic appearance)    Carotid stenosis    a. Carotid US 2/19 bilat ICA 1-39%   Chronic combined systolic and diastolic CHF (congestive heart failure) ([place])    Hyperlipidemia 10/16/2014   Ischemic cardiomyopathy    a. Echo at [facility] 3/16:  EF 35-40%, diff HK, mild LVH, mild LAE, mild TR;  b. Echo 7/16: mild LVH, EF 50%, inf and lat HK-AK, Gr 2 DD, mod LAE, mild RVE, reduced RVSF, trivial TR, PASP 30 mmHg // c. Echo 5/17: moderate LVH, EF 45-50%, anteroseptal HK, grade 1 diastolic dysfunction, aortic sclerosis    Type 2 diabetes mellitus ([place]) 03/23/2014   Past Surgical History:  Procedure Laterality Date   CARDIAC CATHETERIZATION N/A 02/24/2015   Procedure: Left Heart Cath and Cors/Grafts Angiography;  Surgeon: Sherren Mocha, MD;  Location: [place] CV [facility];  Service: Cardiovascular;  Laterality: N/A;  CARDIAC CATHETERIZATION N/A 02/24/2015   Procedure: Coronary Stent Intervention;  Surgeon: Sherren Mocha, MD;  Location: [place] [facility];  Service: Cardiovascular;  Laterality: N/A;   CORONARY ARTERY BYPASS GRAFT N/A 03/28/2014   Procedure: CORONARY ARTERY BYPASS GRAFTING (CABG);  Surgeon: Melrose Nakayama, MD;  Location: [place];  Service: Open Heart Surgery;  Laterality: N/A;   LEFT HEART CATHETERIZATION WITH CORONARY ANGIOGRAM  N/A 03/23/2014   Procedure: LEFT HEART CATHETERIZATION WITH CORONARY ANGIOGRAM;  Surgeon: Blane Ohara, MD;  Location: [facility] CATH LAB;  Service: Cardiovascular;  Laterality: N/A;   PERCUTANEOUS CORONARY STENT INTERVENTION (PCI-S)  02/24/2015   PDA   TEE WITHOUT CARDIOVERSION N/A 03/28/2014   Procedure: TRANSESOPHAGEAL ECHOCARDIOGRAM (TEE);  Surgeon: Melrose Nakayama, MD;  Location: [facility];  Service: Open Heart Surgery;  Laterality: N/A;     Current Meds  Medication Sig   carvedilol (COREG) 25 MG tablet Take 1 tablet (25 mg total) by mouth 2 (two) times daily.   clopidogrel (PLAVIX) 75 MG tablet Take 1 tablet (75 mg total) by mouth daily.   Co-Enzyme Q10 200 MG CAPS Take 200 mg by mouth daily.   Continuous Blood Gluc Receiver (FREESTYLE LIBRE 14 DAY READER) DEVI Use 1 kit as directed E11.65   docusate sodium (COLACE) 100 MG capsule Take 1 capsule by mouth 2 (two) times daily.   empagliflozin (JARDIANCE) 10 MG TABS tablet Take 10 mg by mouth daily.    erythromycin ophthalmic ointment Place 1 application into the left eye as needed (dry eye).    furosemide (LASIX) 40 MG tablet TAKE 2 TABLETS BY MOUTH EVERY DAY IN THE MORNING AND TAKE 1 TABLET IN THE EVENING   insulin aspart (NOVOLOG) 100 UNIT/ML injection Inject 100 Units into the skin continuous. Patient has insulin pump   Multiple Vitamin (MULTIVITAMIN) tablet Take 1 tablet by mouth daily.   nitroGLYCERIN (NITROSTAT) 0.4 MG SL tablet Place 1 tablet (0.4 mg total) under the tongue every 5 (five) minutes as needed for chest pain.   rosuvastatin (CRESTOR) 40 MG tablet Take 40 mg by mouth daily.   triamcinolone cream (KENALOG) 0.5 % Apply 1 application topically daily as needed (nose).    [DISCONTINUED] moxifloxacin (VIGAMOX) 0.5 % ophthalmic solution Place 1 drop into both eyes 4 (four) times daily.     Allergies:   Spironolactone   Social History   Tobacco Use   Smoking status: Never Smoker   Smokeless tobacco: Never  Used  Substance Use Topics   Alcohol use: No    Alcohol/week: 0.0 standard drinks   Drug use: No     Family Hx: The patient's family history includes Diabetes in his brother; Healthy in his father and mother; Heart attack in his father. There is no history of Stroke.  ROS:   Please see the history of present illness.    All other systems reviewed and are negative.   Prior CV studies:   The following studies were reviewed today:  Echo 07/07/17 Mild LVH, EF 50-55, Gr 1 DD, MAC, inf-lat HK  Echo 5/17:  moderate LVH, EF 45-50%, anteroseptal HK, grade 1 diastolic dysfunction, aortic sclerosis    LHC 02/24/15 LAD mid 100%, D1 ostial 85% LCx ostial 100%, Lat OM1 filled by collats from RPLB2 RCA with RPDA stent with 95% ISR L-LAD patent S-OM1 occluded S-D1 occluded PCI:  2.5 x 12 mm Promus DES to RPDA 1. Severe native 3 vessel CAD with total occlusion of the LAD, severe diffuse stenosis of the  first diagonal, total occlusion of the left circumflex, and severe stenosis of the right PDA 2. S/P aorto-coronary bypass surgery with continued patency of the LIMA - LAD and total occlusion of the SVG-OM and SVG-diagonal 3. Successful PCI of the right PDA Continue DAPT long-term. Residual CAD is not amenable to PCI secondary to diffuse distal vessel disease/diabetic pattern.   Myoview 02/13/15 This is a high risk study with a prior MI in the basal and mid inferior, inferolateral (and possibly basal anterior) walls with a large area, severe severity ischemia in the basal and mid anterolateral, anterior, apical lateral, anterior, inferior walls and in the true apex. EF 31%   Echo 08/26/14 Mild LVH, EF 50%, inferior and lateral HK-AK, Gr 2 DD, mod LAE, mild RVE, reduced RV function, trivial TR, PASP 30 mmHg   Echo at [facility] 04/16/14 Mod L Eff, EF 35-40%, Impaired relaxation, Diff HK - Ant and AS best preserved, Mild LVH, Mild LAE, Mild TR   Carotid US 03/26/14 1-39% internal carotid artery stenosis  bilaterally   Echo 04/02/14 EF 50%, mild LVH, inferior and inferolateral HK, mild MR, LA upper limits of normal, normal RV function, mild TR   Cardiac cath 03/23/14 LM:  Patent.  LAD:  Prox 50%, mid 75-80%.  D1 trifurcates into 3 small subbranches. Ostium has 90% stenosis. I LCx:  prox 90%, OM1 80% stenosis, OM2 80% stenosis. RCA:  PDA 80%  EF:  Anterolateral wall is akinetic.  EF is 40-45%.   Labs/Other Tests and Data Reviewed:    EKG:  No ECG reviewed.  Recent Labs: No results found for requested labs within last 8760 hours.          Recent Lipid Panel Lab Results  Component Value Date/Time   CHOL 123 09/02/2016 08:46 AM   TRIG 104 09/02/2016 08:46 AM   HDL 45 09/02/2016 08:46 AM   CHOLHDL 2.7 09/02/2016 08:46 AM   CHOLHDL 3.1 03/24/2014 03:14 AM   LDLCALC 57 09/02/2016 08:46 AM      Wt Readings from Last 3 Encounters:  06/23/18 235 lb (106.6 kg)  06/22/17 242 lb (109.8 kg)  12/17/16 234 lb 6.4 oz (106.3 kg)     Objective:    Vital Signs:  Ht _0  (1.727 m)    Wt 235 lb (106.6 kg)    BMI 35.73 kg/m    VITAL SIGNS:  reviewed GEN:  no acute distress RESPIRATORY:  no labored breathing NEURO:  alert and oriented PSYCH:  he seems to be in good spirits  ASSESSMENT & PLAN:    Chronic combined systolic and diastolic congestive heart failure ([place]) Echo in May 2019 demonstrated EF 50-55 with mild diastolic dysfunction.  He was previously on ARB therapy.  However, he is no longer taking this for unclear reasons.  He does remain on beta-blocker therapy.  He is NYHA 2.  Volume status seems to be stable.  He does not have a blood pressure cuff.  I will have one provided to him through our [facility].  I have asked him to monitor his blood pressure over several weeks and send me those readings.  If his blood pressure will tolerate, I will consider adding low-dose losartan.  Coronary artery disease involving native coronary artery of native heart without  angina pectoris History of lateral ST elevation myocardial infarction in February 2016 and subsequent CABG.  In January 2017, he had high-grade in-stent restenosis in the RPDA which was treated with a drug-eluting stent.  He denies anginal symptoms.  He is no longer on aspirin for unclear reasons.  As his PCI was more than 3 years ago, it should be okay for him to remain on single agent antiplatelet therapy with Clopidogrel.  Continue statin, beta-blocker.  Essential hypertension He is unable to monitor his blood pressure.  I will have a blood pressure cuff provided for him.  Recent blood pressures with his primary care physician and endocrinologist have been optimal.  Continue current regimen.  Try to resume low-dose ARB for congestive heart failure.  Hyperlipidemia, unspecified hyperlipidemia type Continue high-dose statin.  Educated About Covid-19 Virus Infection The signs and symptoms of COVID-19 were discussed with the patient and how to seek care for testing (follow up with PCP or arrange E-visit).  The importance of social distancing was discussed today.  Time:   Today, I have spent 18 minutes with the patient with telehealth technology discussing the above problems.     Medication Adjustments/Labs and Tests Ordered: Current medicines are reviewed at length with the patient today.  Concerns regarding medicines are outlined above.   Tests Ordered: No orders of the defined types were placed in this encounter.   Medication Changes: No orders of the defined types were placed in this encounter.   Disposition:  Follow up in 6 month(s)  Signed, Richardson Dopp, PA-C  06/23/2018 2:11 PM     [facility]

## 2018-06-23 ENCOUNTER — Telehealth: Payer: Self-pay | Admitting: Licensed Clinical Social Worker

## 2018-06-23 ENCOUNTER — Encounter: Payer: Self-pay | Admitting: Physician Assistant

## 2018-06-23 ENCOUNTER — Telehealth (INDEPENDENT_AMBULATORY_CARE_PROVIDER_SITE_OTHER): Payer: Medicare Other | Admitting: Physician Assistant

## 2018-06-23 ENCOUNTER — Other Ambulatory Visit: Payer: Self-pay

## 2018-06-23 VITALS — Ht 68.0 in | Wt 235.0 lb

## 2018-06-23 DIAGNOSIS — I5042 Chronic combined systolic (congestive) and diastolic (congestive) heart failure: Secondary | ICD-10-CM | POA: Diagnosis not present

## 2018-06-23 DIAGNOSIS — Z7189 Other specified counseling: Secondary | ICD-10-CM

## 2018-06-23 DIAGNOSIS — I10 Essential (primary) hypertension: Secondary | ICD-10-CM

## 2018-06-23 DIAGNOSIS — I2510 Atherosclerotic heart disease of native coronary artery without angina pectoris: Secondary | ICD-10-CM

## 2018-06-23 DIAGNOSIS — E785 Hyperlipidemia, unspecified: Secondary | ICD-10-CM

## 2018-06-23 NOTE — Telephone Encounter (Signed)
CSW referred to assist patient with obtaining a BP cuff. CSW contacted patient to inform cuff will be delivered to home next week. Patient grateful for support and assistance. CSW available as needed. Jackie Tylor Courtwright, LCSW, CCSW-MCS [phone]  

## 2018-06-23 NOTE — Patient Instructions (Signed)
Medication Instructions:  No changes  If you need a refill on your cardiac medications before your next appointment, please call your pharmacy.   Lab work: None  If you have labs (blood work) drawn today and your tests are completely normal, you will receive your results only by: Marland Kitchen MyChart Message (if you have MyChart) OR . A paper copy in the mail If you have any lab test that is abnormal or we need to change your treatment, we will call you to review the results.  Testing/Procedures: None   Follow-Up: At [facility], you and your health needs are our priority.  As part of our continuing mission to provide you with exceptional heart care, we have created designated Provider Care Teams.  These Care Teams include your primary Cardiologist (physician) and Advanced Practice Providers (APPs -  Physician Assistants and Nurse Practitioners) who all work together to provide you with the care you need, when you need it. You will need a follow up appointment in:  6 months.  Please call our office 2 months in advance to schedule this appointment.  You may see Danny Bollman, MD or Danny Newcomer, PA-C   Any Other Special Instructions Will Be Listed Below (If Applicable).  I will have a blood pressure machine sent to your house.  Once you get it, check your blood pressure several times a week.  After several weeks, send me readings through MyChart.  I will review those and determine if we can adjust your medications further.

## 2018-06-29 ENCOUNTER — Other Ambulatory Visit: Payer: Self-pay | Admitting: Physician Assistant

## 2018-07-12 NOTE — Telephone Encounter (Signed)
Please contact patient to clarify BP readings. It looks like he wrote the average is 124/78.  It is not clear if that is what he meant or if these 3 readings were the only ones he has collected. Tereso Newcomer, PA-C    07/12/2018 7:58 AM

## 2018-07-20 ENCOUNTER — Telehealth: Payer: Self-pay | Admitting: Cardiovascular Disease
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
## 2018-07-20 NOTE — Telephone Encounter (Signed)
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 Patient reports BP 155/80 No other symptoms   Patient wants to know " if should be working at all with his heart condition"

## 2018-08-27 ENCOUNTER — Other Ambulatory Visit: Payer: Self-pay | Admitting: Cardiovascular Disease

## 2018-09-09 ENCOUNTER — Other Ambulatory Visit: Payer: Self-pay | Admitting: Physician Assistant

## 2018-10-17 ENCOUNTER — Other Ambulatory Visit: Payer: Self-pay | Admitting: Cardiovascular Disease

## 2019-02-28 ENCOUNTER — Other Ambulatory Visit: Payer: Self-pay

## 2019-02-28 ENCOUNTER — Ambulatory Visit: Payer: Medicare Other | Admitting: Physician Assistant

## 2019-02-28 ENCOUNTER — Other Ambulatory Visit: Payer: Self-pay | Admitting: Cardiovascular Disease

## 2019-02-28 ENCOUNTER — Encounter: Payer: Self-pay | Admitting: Physician Assistant

## 2019-02-28 VITALS — BP 132/82 | HR 69 | Ht 68.0 in | Wt 266.8 lb

## 2019-02-28 DIAGNOSIS — I5042 Chronic combined systolic (congestive) and diastolic (congestive) heart failure: Secondary | ICD-10-CM | POA: Diagnosis not present

## 2019-02-28 DIAGNOSIS — E785 Hyperlipidemia, unspecified: Secondary | ICD-10-CM | POA: Diagnosis not present

## 2019-02-28 DIAGNOSIS — I2510 Atherosclerotic heart disease of native coronary artery without angina pectoris: Secondary | ICD-10-CM | POA: Diagnosis not present

## 2019-02-28 DIAGNOSIS — I10 Essential (primary) hypertension: Secondary | ICD-10-CM | POA: Diagnosis not present

## 2019-02-28 MED ORDER — LOSARTAN POTASSIUM 25 MG PO TABS: 25.0000 mg | ORAL_TABLET | Freq: Every day | ORAL | 3 refills | Status: DC

## 2019-02-28 NOTE — Patient Instructions (Addendum)
Medication Instructions:  Your physician has recommended you make the following change in your medication:   1. Begin Losartan 25mg  tablet, once daily.   Labwork: Your physician recommends that you return for lab work in: Feb 3 for BMP. You may come by the office anytime between 8am and 4:30pm  Testing/Procedures: None ordered.  Follow-Up:  Your physician recommends that you schedule a follow-up appointment in:   6 months with Feb 5, PA or Dr. Tereso Newcomer   Any Other Special Instructions Will Be Listed Below (If Applicable).     If you need a refill on your cardiac medications before your next appointment, please call your pharmacy.

## 2019-02-28 NOTE — Progress Notes (Signed)
Cardiology Office Note:    Date:  02/28/2019   ID:  Danny, [name] 02/14/1956, MRN [id number]  PCP:  Danny Hartigan, MD  Cardiologist:  Danny Mocha, MD  Electrophysiologist:  None   Referring MD: Danny Hartigan, MD   Chief Complaint  Patient presents with  . Follow-up    CAD, CHF    History of Present Illness:    Danny [name] is a 64 y.o. male with:   Coronary artery disease  S/p Lat MI in 2016 >> CABG  Cath 02/2015: L-LAD ok, S-OM and S-Dx occluded; Danny [name] stent 95 ISR >> s/p DES to Danny [name]  Diff diabetic vessel dz  Combined systolic and diastolic CHF  EF 18-29  Improved >> Echocardiogram 06/2017: EF 55  Ischemic CM  Diabetes mellitus   Hypertension   Hyperlipidemia   Carotid artery dz  [place] 2/19 - Bilat ICA 1-39  Mr. Danny [name] returns for follow up on coronary artery disease, congestive heart failure.  He is here alone.  Today [birth year] his birthday.  He has not had any chest discomfort, tightness, heaviness, pressure.  He has not had significant shortness of breath.  He walks on a daily basis.  He has not had orthopnea, paroxysmal nocturnal dyspnea, leg swelling or syncope.  Prior CV studies:   The following studies were reviewed today:   Echo 07/07/17 Mild LVH, EF 50-55, Gr 1 DD, MAC, inf-lat HK  Echo 5/17:  moderate LVH, EF 45-50%, anteroseptal HK, grade 1 diastolic dysfunction, aortic sclerosis   Danny [name]/9/17 LAD mid 100%, D1 ostial 85% LCx ostial 100%, Lat OM1 filled by collats from Danny [name] RCA with Danny [name] stent with 95% ISR L-LAD patent S-OM1 occluded S-D1 occluded PCI:2.5 x 12 mm Danny [name] DES to Danny [name] 1. Severe native 3 vessel CAD with total occlusion of the LAD, severe diffuse stenosis of the first diagonal, total occlusion of the left circumflex, and severe stenosis of the right PDA 2. S/P aorto-coronary bypass surgery with continued patency of the LIMA - LAD and total occlusion of the SVG-OM and SVG-diagonal 3. Successful PCI  of the right PDA Continue DAPT long-term. Residual CAD is not amenable to PCI secondary to diffuse distal vessel disease/diabetic pattern.  Myoview 02/13/15 This is a high risk study with a prior MI in the basal and mid inferior, inferolateral (and possibly basal anterior) walls with a large area, severe severity ischemia in the basal and mid anterolateral, anterior, apical lateral, anterior, inferior walls and in the true apex. EF 31%  Echo 08/26/14 Mild LVH, EF 50%, inferior and lateral HK-AK, Gr 2 DD, mod LAE, mild RVE, reduced RV function, trivial TR, PASP 30 mmHg  Echo at Danny [name] 04/16/14 Mod L Eff, EF 35-40%, Impaired relaxation, Diff HK - Ant and AS best preserved, Mild LVH, Mild LAE, Mild TR  Carotid US 03/26/14 1-39% internal carotid artery stenosis bilaterally  Echo 04/02/14 EF 50%, mild LVH, inferior and inferolateral HK, mild MR, LA upper limits of normal, normal RV function, mild TR  Cardiac cath 03/23/14 HB:ZJIRCV.  ELF:YBOF 50%, mid 75-80%.D1 trifurcates into 3 small subbranches. Ostium has 90% stenosis. I BPZ:WCHE 90%, OM1 80% stenosis, OM2 80% stenosis. RCA:PDA 80%  EF:Anterolateral wall is akinetic.EF is 40-45%.    Past Medical History:  Diagnosis Date  . Acute MI, lateral wall, initial episode of care (Danny [name]) 03/23/2014  . CAD (coronary artery disease)    a. s/p Lat MI 2/16 >> s/p CABG; b. LHC 1/17: mLAD 100, oD1 85%, oLCx 100,  OM1 filled by collats from Danny [name], Danny [name] stent 95% ISR, L-LAD patent, S-OM1 occluded, S-D1 occluded >> PCI: Danny [name] DES to Danny [name]  - residual CAD not amenable to PCI (dist vessel/diabetic appearance)   . Carotid stenosis    a. Carotid US 2/19 bilat ICA 1-39%  . Chronic combined systolic and diastolic CHF (congestive heart failure) (Danny [name])   . Hyperlipidemia 10/16/2014  . Ischemic cardiomyopathy    a. Echo at Danny [name] 3/16:  EF 35-40%, diff HK, mild LVH, mild LAE, mild TR;  b. Echo 7/16: mild LVH, EF 50%, inf and lat HK-AK, Gr 2 DD, mod LAE, mild  RVE, reduced RVSF, trivial TR, PASP 30 mmHg // c. Echo 5/17: moderate LVH, EF 45-50%, anteroseptal HK, grade 1 diastolic dysfunction, aortic sclerosis   . Type 2 diabetes mellitus (Danny [name]) 03/23/2014   Surgical Hx: The patient  has a past surgical history that includes left heart catheterization with coronary angiogram (N/A, 03/23/2014); Coronary artery bypass graft (N/A, 03/28/2014); TEE without cardioversion (N/A, 03/28/2014); Percutaneous coronary stent intervention (pci-s) (02/24/2015); Cardiac catheterization (N/A, 02/24/2015); and Cardiac catheterization (N/A, 02/24/2015).   Current Medications: Current Meds  Medication Sig  . carvedilol (COREG) 25 MG tablet TAKE 1 TABLET BY MOUTH TWICE DAILY  . Cholecalciferol 50 MCG (2000 UT) CAPS Take 3 capsules daily for 3 months, then reduce to 1 capsule daily thereafter for Vitamin D Deficiency.  . clopidogrel (PLAVIX) 75 MG tablet TAKE 1 TABLET BY MOUTH EVERY DAY  . Co-Enzyme Q10 200 MG CAPS Take 200 mg by mouth daily.  . Continuous Blood Gluc Receiver (FREESTYLE LIBRE 14 DAY READER) DEVI Use 1 kit as directed E11.65  . cyanocobalamin 1000 MCG tablet Take 1 tablet by mouth daily.  Marland Kitchen docusate sodium (COLACE) 100 MG capsule Take 1 capsule by mouth 2 (two) times daily.  . empagliflozin (JARDIANCE) 10 MG TABS tablet Take 10 mg by mouth daily.   Marland Kitchen erythromycin ophthalmic ointment Place 1 application into the left eye as needed (dry eye).   . insulin aspart (NOVOLOG) 100 UNIT/ML injection Inject 100 Units into the skin continuous. Patient has insulin pump  . Multiple Vitamin (MULTIVITAMIN) tablet Take 1 tablet by mouth daily.  . nitroGLYCERIN (NITROSTAT) 0.4 MG SL tablet Place 1 tablet (0.4 mg total) under the tongue every 5 (five) minutes as needed for chest pain.  . rosuvastatin (CRESTOR) 40 MG tablet TAKE 1 TABLET BY MOUTH EVERY DAY  . triamcinolone cream (KENALOG) 0.5 % Apply 1 application topically daily as needed (nose).   . [DISCONTINUED] furosemide (LASIX)  40 MG tablet TAKE 2 TABLETS BY MOUTH EVERY DAY IN THE MORNING AND TAKE 1 TABLET IN THE EVENING     Allergies:   Spironolactone   Social History   Tobacco Use  . Smoking status: Never Smoker  . Smokeless tobacco: Never Used  Substance Use Topics  . Alcohol use: No    Alcohol/week: 0.0 standard drinks  . Drug use: No     Family Hx: The patient's family history includes Diabetes in his brother; Healthy in his father and mother; Heart attack in his father. There is no history of Stroke.  ROS:   Please see the history of present illness.    ROS All other systems reviewed and are negative.   EKGs/Labs/Other Test Reviewed:    EKG:  EKG is  ordered today.  The ekg ordered today demonstrates normal sinus rhythm, heart rate 65, normal axis, inferior Q waves, T wave inversions 2, 3, aVF, V3-V6, QTC 443  Recent Labs: No results found for requested labs within last 8760 hours.   Recent Lipid Panel Lab Results  Component Value Date/Time   CHOL 123 09/02/2016 08:46 AM   TRIG 104 09/02/2016 08:46 AM   HDL 45 09/02/2016 08:46 AM   CHOLHDL 2.7 09/02/2016 08:46 AM   CHOLHDL 3.1 03/24/2014 03:14 AM   LDLCALC 57 09/02/2016 08:46 AM       Physical Exam:    VS:  BP 132/82   Pulse 69   Ht _0  (1.727 m)   Wt 266 lb 12.8 oz (121 kg)   SpO2 95%   BMI 40.57 kg/m     Wt Readings from Last 3 Encounters:  02/28/19 266 lb 12.8 oz (121 kg)  06/23/18 235 lb (106.6 kg)  06/22/17 242 lb (109.8 kg)     Physical Exam  Constitutional: He is oriented to person, place, and time. He appears well-developed and well-nourished. No distress.  HENT:  Head: Normocephalic and atraumatic.  Eyes: No scleral icterus.  Neck: No JVD present. No thyromegaly present.  Cardiovascular: Normal rate, regular rhythm and normal heart sounds.  No murmur heard. Pulmonary/Chest: Effort normal and breath sounds normal. He has no rales.  Abdominal: Soft. There is no hepatomegaly.  Musculoskeletal:         General: No edema.  Lymphadenopathy:    He has no cervical adenopathy.  Neurological: He is alert and oriented to person, place, and time.  Skin: Skin is warm and dry.  Psychiatric: He has a normal mood and affect.    ASSESSMENT & PLAN:    1. Chronic combined systolic and diastolic congestive heart failure ([place]) EF has improved to 50-55% by echocardiogram May 2019.  NYHA II.  Volume status stable.  Continue carvedilol, furosemide.  Resume ARB with losartan 25 mg daily.  Obtain follow-up BMET in 3-4 weeks.  2. Coronary artery disease involving native coronary artery of native heart without angina pectoris History of lateral STEMI in 2/16 followed by CABG.  Cardiac catheterization in 2017 demonstrated an occluded vein graft to the OM and vein graft to the first diagonal.  LIMA-LAD was patent.  He had 95% in-stent restenosis in the Danny [name] which was stented with a DES.  Currently, his ECG demonstrates inferolateral T wave inversions which are fairly new.  However, he is not having any symptoms to suggest angina and, therefore does not require further testing or intervention (ISCHEMIA Trial).  I advised the patient that he should contact us if he should start to develop symptoms.  Otherwise, continue medical therapy with carvedilol, clopidogrel, rosuvastatin.  3. Essential hypertension Fair control.  Resume ARB therapy as noted above.  4. Hyperlipidemia, unspecified hyperlipidemia type LDL in May 2020 was 48.  This did increase to 71 in November 2020.  Continue to monitor this.  If his LDL continues to increase, consider adding ezetimibe.   Dispo:  Return in about 6 months (around 08/28/2019) for Routine Follow Up, w/ Dr. Burt Knack, or Richardson Dopp, PA-C, (virtual or in-person).   Medication Adjustments/Labs and Tests Ordered: Current medicines are reviewed at length with the patient today.  Concerns regarding medicines are outlined above.  Tests Ordered: Orders Placed This Encounter  Procedures    . Basic Metabolic Panel (BMET)  . EKG 12-Lead   Medication Changes: Meds ordered this encounter  Medications  . losartan (COZAAR) 25 MG tablet    Sig: Take 1 tablet (25 mg total) by mouth daily.    Dispense:  90 tablet  Refill:  3    Signed, Richardson Dopp, PA-C  02/28/2019 3:01 PM    [facility] [address], [place], [place]  [address] Phone: [phone]; Fax: [phone]

## 2019-03-02 ENCOUNTER — Ambulatory Visit: Payer: Medicare Other | Admitting: Physician Assistant

## 2019-03-21 ENCOUNTER — Other Ambulatory Visit: Payer: Medicare Other | Admitting: *Deleted

## 2019-03-21 ENCOUNTER — Other Ambulatory Visit: Payer: Self-pay

## 2019-03-21 DIAGNOSIS — I5042 Chronic combined systolic (congestive) and diastolic (congestive) heart failure: Secondary | ICD-10-CM

## 2019-03-21 DIAGNOSIS — I10 Essential (primary) hypertension: Secondary | ICD-10-CM

## 2019-03-21 LAB — BASIC METABOLIC PANEL
BUN/Creatinine Ratio: 10 (ref 10–24)
BUN: 12 mg/dL (ref 8–27)
CO2: 23 mmol/L (ref 20–29)
Calcium: 9.3 mg/dL (ref 8.6–10.2)
Chloride: 102 mmol/L (ref 96–106)
Creatinine, Ser: 1.2 mg/dL (ref 0.76–1.27)
GFR calc Af Amer: 74 mL/min/{1.73_m2} (ref >59)
GFR calc non Af Amer: 64 mL/min/{1.73_m2} (ref >59)
Glucose: 179 mg/dL — ABNORMAL HIGH (ref 65–99)
Potassium: 4 mmol/L (ref 3.5–5.2)
Sodium: 141 mmol/L (ref 134–144)

## 2019-04-04 NOTE — Telephone Encounter (Signed)
Called the patient to discuss MyChart message. He is frustrated - he has brought up his weight gain to several providers and doesn't feel heard. He is following the keto diet and his A1C has improved from >11 to 8.6. He has been constipated. He cannot go to the bathroom at all if he does not take stool softeners and Miralax.  He states he is up to 269# and his waist has increased from 34in to 44in "over time." He doesn't think he is swollen. His legs are "fat." There is no indentation when he pushes on his skin. He states he is thirsty all the time, despite drinking >64oz of water daily. He also complains he gets cold every day at 1600. He states his BP runs 130s/80s and HR 70s. He is taking his medications as directed.   He did research and is convinced his weight gain and thirst is caused by his carvedilol. He would like to change the medication to something else. He understands he will be called with further recommendations.

## 2019-04-05 MED ORDER — METOPROLOL SUCCINATE ER 100 MG PO TB24: 100.0000 mg | ORAL_TABLET | Freq: Every day | ORAL | 0 refills | Status: DC

## 2019-04-05 NOTE — Telephone Encounter (Signed)
Make sure patient is not having shortness of breath, orthopnea, paroxysmal nocturnal dyspnea.  If so, do not change meds and set up an appt.   Carvedilol is very important for his CHF.  His EF has improved on current Rx.  It is somewhat possible that the Carvedilol has contributed to weight gain.  Insulin is also sometimes a culprit.  May want to check with PCP.    We can try to change is beta-blocker to see if this makes a difference.  He is on max dose of Carvedilol. We should try to ultimately get him to goal dose of Metoprolol Succinate (which is 150 mg once daily).    PLAN:  1. DC Carvedilol 2. Start Metoprolol Succinate 100 mg once daily  3. RN visit or HTN clinic visit (whichever is easier to arrange) 2 weeks after switching to get a BP and ECG. 4. If HR > 65 and BP > 110/60, Increase Metoprolol succinate to 150 mg once daily. Tereso Newcomer, PA-C    04/05/2019 10:53 AM

## 2019-04-05 NOTE — Telephone Encounter (Signed)
Confirmed with the patient he does not have SOB (unless he really exerts himself- he attributes this to the weight gain), orthopnea, or nocturnal dyspnea.   Instructed the patient to STOP COREG.  Instructed him to START TOPROL 100 mg daily.  Scheduled him for NV for BP and EKG 3/8 at 1100. He understands medications may be changed again at that time. He was grateful for assistance.

## 2019-04-23 ENCOUNTER — Ambulatory Visit (INDEPENDENT_AMBULATORY_CARE_PROVIDER_SITE_OTHER): Payer: Medicare Other

## 2019-04-23 ENCOUNTER — Other Ambulatory Visit: Payer: Self-pay

## 2019-04-23 ENCOUNTER — Telehealth: Payer: Self-pay

## 2019-04-23 VITALS — BP 158/90 | HR 68 | Ht 68.0 in | Wt 268.0 lb

## 2019-04-23 DIAGNOSIS — I2510 Atherosclerotic heart disease of native coronary artery without angina pectoris: Secondary | ICD-10-CM

## 2019-04-23 NOTE — Patient Instructions (Signed)
Medication Instructions:  NO CHANGES  *If you need a refill on your cardiac medications before your next appointment, please call your pharmacy*   Lab Work: NONE ORDERED If you have labs (blood work) drawn today and your tests are completely normal, you will receive your results only by: Marland Kitchen MyChart Message (if you have MyChart) OR . A paper copy in the mail If you have any lab test that is abnormal or we need to change your treatment, we will call you to review the results.   Testing/Procedures: NONE ORDERED   Follow-Up: At [facility], you and your health needs are our priority.  As part of our continuing mission to provide you with exceptional heart care, we have created designated Provider Care Teams.  These Care Teams include your primary Cardiologist (physician) and Advanced Practice Providers (APPs -  Physician Assistants and Nurse Practitioners) who all work together to provide you with the care you need, when you need it.  We recommend signing up for the patient portal called "MyChart".  Sign up information is provided on this After Visit Summary.  MyChart is used to connect with patients for Virtual Visits (Telemedicine).  Patients are able to view lab/test results, encounter notes, upcoming appointments, etc.  Non-urgent messages can be sent to your provider as well.   To learn more about what you can do with MyChart, go to [email or web address].    Your next appointment:   WE WILL SEND OUT A REMINDER LETTER FOR YOU TO SEE [place], [facility] SOMETIME IN July 2021  The format for your next appointment:   In Person  Provider:   Tereso Newcomer, PA-C   Other Instructions PER DR. ROSS MONITOR BLOOD PRESSURE FOR THE NEXT 3-4 WEEKS, SEND READINGS INTO MY CHART FOR [place], PAC

## 2019-04-23 NOTE — Progress Notes (Signed)
1.) Reason for visit: EKG/BP  2.) Name of MD requesting visit: Tereso Newcomer, PA  3.) H&P: See Chart  4.) ROS related to problem: Medication Change (stopped Coreg, started Toprol 100 mg Daily)  5.) Assessment and plan per MD: Continue to monitor BP for 3-4 weeks, send in results

## 2019-04-23 NOTE — Telephone Encounter (Signed)
Patient came in for BP/EKG per Tereso Newcomer, PA after medication changes on 04/05/19 (Stopped Coreg and started Toprol 100 mg Daily).    Today 3/8 BP 158/90 HR 68.  Reviewed with DOD, Dr Tenny Craw.  No medication changes, having patient check BP for next 3-4 weeks and send in results.  Patient verbalized understanding.

## 2019-04-25 ENCOUNTER — Telehealth: Payer: Self-pay

## 2019-04-25 DIAGNOSIS — I10 Essential (primary) hypertension: Secondary | ICD-10-CM

## 2019-04-25 MED ORDER — LOSARTAN POTASSIUM 50 MG PO TABS: 50.0000 mg | ORAL_TABLET | Freq: Every day | ORAL | 3 refills | Status: DC

## 2019-04-25 NOTE — Telephone Encounter (Signed)
I called and left patient a message to call back. 

## 2019-04-25 NOTE — Telephone Encounter (Signed)
Increase Losartan to 50 mg once daily. BMET 2 weeks. Tereso Newcomer, PA-C    04/25/2019 9:58 AM

## 2019-04-25 NOTE — Telephone Encounter (Signed)
Repeat labs scheduled.

## 2019-04-27 ENCOUNTER — Other Ambulatory Visit: Payer: Self-pay | Admitting: Physician Assistant

## 2019-04-27 ENCOUNTER — Telehealth: Payer: Self-pay

## 2019-04-27 MED ORDER — NITROGLYCERIN 0.4 MG SL SUBL: 0.4000 mg | SUBLINGUAL_TABLET | SUBLINGUAL | 2 refills | Status: DC | PRN

## 2019-04-27 NOTE — Telephone Encounter (Signed)
The patient reports SOB on exertion for a few weeks. About 2 weeks ago, he noticed some burning in his chest with the SOB when he was weed whacking. He rested and the symptoms subsided.  He tries to walk twice daily at 1000 and 1600. Last Monday he noticed when he went uphill, his symptoms started to increase in frequency on his walks. He reports he has not taken any NTG. He states when he forgets to take his Prilosec, he does not get the burning at all.  Confirmed with the patient he has NTG that is not expired. Reiterated to him to keep it on him at all times. Reviewed NTG instructions. Scheduled the patient for OV with Dr. Excell Seltzer Monday. ED precautions reviewed.  He was grateful for assistance.

## 2019-04-27 NOTE — Telephone Encounter (Signed)
The patient reports increasing SOB on exertion for the last several weeks. Monday, he experiences chest burning along with the SOB. He notices the symptoms when he takes his daily walks (especially going uphill) or when he is weed whacking. His symptoms subside with rest after a couple of minutes. At rest, he feels fine.  He has noticed the burning only occurs when he does not take his Prilosec.  Confirmed with him he has an active (and not expired) Rx for NTG. Reiterated to him the importance of keeping it on him at all times. NTG instructions reviewed. The patient is currently asymptomatic.  Scheduled him Monday for evaluation with Dr. Excell Seltzer. ED precautions reviewed.

## 2019-04-30 ENCOUNTER — Other Ambulatory Visit: Payer: Self-pay

## 2019-04-30 ENCOUNTER — Ambulatory Visit: Payer: Medicare Other | Admitting: Cardiovascular Disease

## 2019-04-30 ENCOUNTER — Encounter: Payer: Self-pay | Admitting: Cardiovascular Disease

## 2019-04-30 VITALS — BP 152/98 | HR 78 | Ht 68.0 in | Wt 263.8 lb

## 2019-04-30 DIAGNOSIS — I5042 Chronic combined systolic (congestive) and diastolic (congestive) heart failure: Secondary | ICD-10-CM

## 2019-04-30 DIAGNOSIS — I25118 Atherosclerotic heart disease of native coronary artery with other forms of angina pectoris: Secondary | ICD-10-CM | POA: Diagnosis not present

## 2019-04-30 MED ORDER — ISOSORBIDE MONONITRATE ER 30 MG PO TB24: 30.0000 mg | ORAL_TABLET | Freq: Every day | ORAL | 3 refills | Status: DC

## 2019-04-30 MED ORDER — PANTOPRAZOLE SODIUM 40 MG PO TBEC: 40.0000 mg | DELAYED_RELEASE_TABLET | Freq: Every day | ORAL | 3 refills | Status: DC

## 2019-04-30 MED ORDER — ASPIRIN EC 81 MG PO TBEC: 81.0000 mg | DELAYED_RELEASE_TABLET | Freq: Every day | ORAL | 3 refills | Status: DC

## 2019-04-30 NOTE — H&P (View-Only) (Signed)
Cardiology Office Note:    Date:  05/02/2019   ID:  Danny [name] October 04, 1955, MRN [id number]  PCP:  Toni Arthurs, NP  Cardiologist:  Sherren Mocha, MD  Electrophysiologist:  None   Referring MD: Toni Arthurs, NP   Chief Complaint  Patient presents with  . Chest Pain    History of Present Illness:    Danny [name] is a 64 y.o. male with a hx of   Past Medical History:  Diagnosis Date  . Acute MI, lateral wall, initial episode of care ([place]) 03/23/2014  . CAD (coronary artery disease)    a. s/p Lat MI 2/16 >> s/p CABG; b. LHC 1/17: mLAD 100, oD1 85%, oLCx 100, OM1 filled by collats from RPLB2, RPDA stent 95% ISR, L-LAD patent, S-OM1 occluded, S-D1 occluded >> PCI: Promus DES to RPDA  - residual CAD not amenable to PCI (dist vessel/diabetic appearance)   . Carotid stenosis    a. Carotid US 2/19 bilat ICA 1-39%  . Chronic combined systolic and diastolic CHF (congestive heart failure) ([place])   . Hyperlipidemia 10/16/2014  . Ischemic cardiomyopathy    a. Echo at [facility] 3/16:  EF 35-40%, diff HK, mild LVH, mild LAE, mild TR;  b. Echo 7/16: mild LVH, EF 50%, inf and lat HK-AK, Gr 2 DD, mod LAE, mild RVE, reduced RVSF, trivial TR, PASP 30 mmHg // c. Echo 5/17: moderate LVH, EF 45-50%, anteroseptal HK, grade 1 diastolic dysfunction, aortic sclerosis   . Type 2 diabetes mellitus ([place]) 03/23/2014    Past Surgical History:  Procedure Laterality Date  . CARDIAC CATHETERIZATION N/A 02/24/2015   Procedure: Left Heart Cath and Cors/Grafts Angiography;  Surgeon: Sherren Mocha, MD;  Location: [facility];  Service: Cardiovascular;  Laterality: N/A;  . CARDIAC CATHETERIZATION N/A 02/24/2015   Procedure: Coronary Stent Intervention;  Surgeon: Sherren Mocha, MD;  Location: [facility];  Service: Cardiovascular;  Laterality: N/A;  . CORONARY ARTERY BYPASS GRAFT N/A 03/28/2014   Procedure: CORONARY ARTERY BYPASS GRAFTING (CABG);  Surgeon: Melrose Nakayama, MD;   Location: [facility];  Service: Open Heart Surgery;  Laterality: N/A;  . LEFT HEART CATHETERIZATION WITH CORONARY ANGIOGRAM N/A 03/23/2014   Procedure: LEFT HEART CATHETERIZATION WITH CORONARY ANGIOGRAM;  Surgeon: Blane Ohara, MD;  Location: [facility] CATH LAB;  Service: Cardiovascular;  Laterality: N/A;  . PERCUTANEOUS CORONARY STENT INTERVENTION (PCI-S)  02/24/2015   PDA  . TEE WITHOUT CARDIOVERSION N/A 03/28/2014   Procedure: TRANSESOPHAGEAL ECHOCARDIOGRAM (TEE);  Surgeon: Melrose Nakayama, MD;  Location: [facility];  Service: Open Heart Surgery;  Laterality: N/A;    Current Medications: Current Meds  Medication Sig  . Cholecalciferol 50 MCG (2000 UT) CAPS Take 1 capsule by mouth daily.  . clopidogrel (PLAVIX) 75 MG tablet TAKE 1 TABLET BY MOUTH EVERY DAY  . Co-Enzyme Q10 200 MG CAPS Take 200 mg by mouth daily.  . Continuous Blood Gluc Receiver (FREESTYLE LIBRE 14 DAY READER) DEVI Use 1 kit as directed E11.65  . cyanocobalamin 1000 MCG tablet Take 1 tablet by mouth daily.  Marland Kitchen docusate sodium (COLACE) 100 MG capsule Take 1 capsule by mouth 2 (two) times daily.  . empagliflozin (JARDIANCE) 10 MG TABS tablet Take 10 mg by mouth daily.   Marland Kitchen erythromycin ophthalmic ointment Place 1 application into the left eye as needed (dry eye).   . furosemide (LASIX) 40 MG tablet TAKE 2 TABLETS BY MOUTH EVERY DAY IN THE MORNING AND TAKE 1 TABLET IN THE  EVENING  . HUMALOG 100 UNIT/ML injection SMARTSIG:0-150 Unit(s) SUB-Q Daily  . losartan (COZAAR) 50 MG tablet Take 1 tablet (50 mg total) by mouth daily.  . metoprolol succinate (TOPROL-XL) 100 MG 24 hr tablet TAKE 1 TABLET (100 MG TOTAL) BY MOUTH DAILY. TAKE WITH OR IMMEDIATELY FOLLOWING A MEAL.  . nitroGLYCERIN (NITROSTAT) 0.4 MG SL tablet Place 1 tablet (0.4 mg total) under the tongue every 5 (five) minutes as needed for chest pain.  . rosuvastatin (CRESTOR) 40 MG tablet TAKE 1 TABLET BY MOUTH EVERY DAY  . triamcinolone cream (KENALOG) 0.5 % Apply 1 application  topically daily as needed (nose).      Allergies:   Spironolactone   Social History   Socioeconomic History  . Marital status: Single    Spouse name: Not on file  . Number of children: Not on file  . Years of education: Not on file  . Highest education level: Not on file  Occupational History  . Not on file  Tobacco Use  . Smoking status: Never Smoker  . Smokeless tobacco: Never Used  Substance and Sexual Activity  . Alcohol use: No    Alcohol/week: 0.0 standard drinks  . Drug use: No  . Sexual activity: Not on file  Other Topics Concern  . Not on file  Social History Narrative   The patient is married. He has grown children. He is a lifelong nonsmoker. He works in Theatre manager.   Social Determinants of Health   Financial Resource Strain:   . Difficulty of Paying Living Expenses:   Food Insecurity:   . Worried About Charity fundraiser in the Last Year:   . Arboriculturist in the Last Year:   Transportation Needs:   . Film/video editor (Medical):   Marland Kitchen Lack of Transportation (Non-Medical):   Physical Activity:   . Days of Exercise per Week:   . Minutes of Exercise per Session:   Stress:   . Feeling of Stress :   Social Connections:   . Frequency of Communication with Friends and Family:   . Frequency of Social Gatherings with Friends and Family:   . Attends Religious Services:   . Active Member of Clubs or Organizations:   . Attends Archivist Meetings:   Marland Kitchen Marital Status:      Family History: The patient's family history includes Diabetes in his brother; Healthy in his father and mother; Heart attack in his father. There is no history of Stroke.  ROS:   Please see the history of present illness.    All other systems reviewed and are negative.  EKGs/Labs/Other Studies Reviewed:    The following studies were reviewed today: Cardiac Cath 02/24/2015: Conclusion   Ost Cx lesion, 100% stenosed.  LIMA is normal in caliber, and is anatomically  normal.  Widely patent LIMA-LAD  SVG .  Prox Graft lesion, 100% stenosed.  Prox Graft lesion, 100% stenosed.  Ost 1st Diag to 1st Diag lesion, 85% stenosed.  Mid LAD lesion, 100% stenosed.  RPDA lesion, 95% stenosed. Post intervention, there is a 0% residual stenosis. The lesion was previously treated with a stent (unknown type).   1. Severe native 3 vessel CAD with total occlusion of the LAD, severe diffuse stenosis of the first diagonal, total occlusion of the left circumflex, and severe stenosis of the right PDA 2. S/P aorto-coronary bypass surgery with continued patency of the LIMA - LAD and total occlusion of the SVG-OM and SVG-diagonal 3. Successful PCI  of the right PDA  Continue DAPT long-term. Residual CAD is not amenable to PCI secondary to diffuse distal vessel disease/diabetic pattern.   Diagnostic Dominance: Right Left Anterior Descending  Mid LAD lesion 100% stenosed  .  First Diagonal Branch  Ost 1st Diag to 1st Diag lesion 85% stenosed  Diffuse small vessel disease in a diabetic pattern  Left Circumflex  Ost Cx lesion 100% stenosed  .  Lateral First Obtuse Marginal Branch  Collaterals  Lat 1st Mrg filled by collaterals from 2nd RPL.    Right Coronary Artery  Right Posterior Descending Artery  RPDA lesion 95% stenosed  The lesion was previously treated with a stent (unknown type).  LIMA LIMA Graft To Mid LAD  LIMA is normal in caliber, and is anatomically normal. Widely patent LIMA-LAD  Graft To 1st Mrg  Prox Graft lesion 100% stenosed  100% at aortic anastomosis  Single Graft Graft To 1st Diag  SVG  Prox Graft lesion 100% stenosed  Aortic anastomosis - 100%  Intervention  RPDA lesion  Angioplasty  Pre-stent angioplasty was performed. A drug-eluting stent was placed. A post-stent angioplasty was not performed. Maximum pressure: 18 atm. The pre-interventional distal flow is normal (TIMI 3). The post-interventional distal flow is normal (TIMI 3). The  intervention was successful. No complications occured at this lesion. IC nitroglycerin was given. A JR4 guide catheter was used. A cougar wire is advanced cross the lesion. Heparin was used for anticoagulation. The patient has been treated chronically with clopidogrel and aspirin. The lesion is predilated with a 2.0 mm balloon, stented with a 2.5 x 12 mm Promus DES, and the stent is then postdilated with a 2.5 mm noncompliant balloon to 18 atm.  There is a 0% residual stenosis post intervention.  Coronary Diagrams  Diagnostic Dominance: Right  Intervention     EKG:  EKG is ordered today.  The ekg ordered today demonstrates NSR 70 bpm, ST-T wave abnormality consider inferior ischemia  Recent Labs: 04/30/2019: BUN 11; Creatinine, Ser 1.13; Hemoglobin 17.7; Platelets 249; Potassium 4.3; Sodium 141  Recent Lipid Panel    Component Value Date/Time   CHOL 123 09/02/2016 0846   TRIG 104 09/02/2016 0846   HDL 45 09/02/2016 0846   CHOLHDL 2.7 09/02/2016 0846   CHOLHDL 3.1 03/24/2014 0314   VLDL 16 03/24/2014 0314   LDLCALC 57 09/02/2016 0846    Physical Exam:    VS:  BP (!) 152/98   Pulse 78   Ht 5' 8"  (1.727 m)   Wt 263 lb 12.8 oz (119.7 kg)   SpO2 94%   BMI 40.11 kg/m     Wt Readings from Last 3 Encounters:  04/30/19 263 lb 12.8 oz (119.7 kg)  04/23/19 268 lb (121.6 kg)  02/28/19 266 lb 12.8 oz (121 kg)     GEN:  Well nourished, well developed in no acute distress HEENT: Normal NECK: No JVD; No carotid bruits LYMPHATICS: No lymphadenopathy CARDIAC: RRR, no murmurs, rubs, gallops RESPIRATORY:  Clear to auscultation without rales, wheezing or rhonchi  ABDOMEN: Soft, non-tender, non-distended MUSCULOSKELETAL:  No edema; No deformity  SKIN: Warm and dry NEUROLOGIC:  Alert and oriented x 3 PSYCHIATRIC:  Normal affect   ASSESSMENT:    1. Coronary artery disease involving native heart with other form of angina pectoris, unspecified vessel or lesion type ([place])   2. Chronic  combined systolic and diastolic congestive heart failure (H[facility])    PLAN:    In order of problems listed above:  1.  The patient has developed progressive symptoms of lifestyle limiting angina and shortness of breath with activity.  He has known coronary artery disease status post CABG and has undergone PCI.  The patient is at significant risk of progressive coronary disease in the setting of obesity and diabetes.  I have recommended cardiac catheterization for definitive evaluation.  His last heart catheterization is reviewed and demonstrated early graft failure of his saphenous vein graft conduits.  At that time he underwent stenting of the right PDA.  I will add isosorbide to his medical regimen.  He will otherwise continue his current therapy which includes clopidogrel, metoprolol succinate, and a high intensity statin drug. I have reviewed the risks, indications, and alternatives to cardiac catheterization, possible angioplasty, and stenting with the patient. Risks include but are not limited to bleeding, infection, vascular injury, stroke, myocardial infection, arrhythmia, kidney injury, radiation-related injury in the case of prolonged fluoroscopy use, emergency cardiac surgery, and death. The patient understands the risks of serious complication is 1-2 in 9211 with diagnostic cardiac cath and 1-2% or less with angioplasty/stenting.  2. Limited by NYHA functional class IIIb symptoms.  Some of this is likely multifactorial in the setting of his coronary disease, weight gain, and deconditioning.  Will continue with his medical program as outlined.  We will assess hemodynamics at the time of his heart catheterization.   Medication Adjustments/Labs and Tests Ordered: Current medicines are reviewed at length with the patient today.  Concerns regarding medicines are outlined above.  Orders Placed This Encounter  Procedures  . Basic metabolic panel  . CBC with Differential/Platelet  . EKG 12-Lead    Meds ordered this encounter  Medications  . aspirin EC 81 MG tablet    Sig: Take 1 tablet (81 mg total) by mouth daily.    Dispense:  90 tablet    Refill:  3  . isosorbide mononitrate (IMDUR) 30 MG 24 hr tablet    Sig: Take 1 tablet (30 mg total) by mouth daily.    Dispense:  90 tablet    Refill:  3  . pantoprazole (PROTONIX) 40 MG tablet    Sig: Take 1 tablet (40 mg total) by mouth daily.    Dispense:  90 tablet    Refill:  3    Patient Instructions  Medication Instructions:  1) START ASPIRIN 81 mg daily 2) STOP PRILOSEC 3) START PANTOPRAZOLE 40 mg daily 4) START IMDUR 30 mg daily  Labwork: TODAY: BMET, CBC  Testing/Procedures: Your physician has requested that you have a cardiac catheterization. Cardiac catheterization is used to diagnose and/or treat various heart conditions. Doctors may recommend this procedure for a number of different reasons. The most common reason is to evaluate chest pain. Chest pain can be a symptom of coronary artery disease (CAD), and cardiac catheterization can show whether plaque is narrowing or blocking your heart's arteries. This procedure is also used to evaluate the valves, as well as measure the blood flow and oxygen levels in different parts of your heart. For further information please visit [email or web address]. Please follow instruction sheet, as given.  Follow-Up: You have an appointment scheduled 3/31 at 11:15AM with Richardson Dopp, PA.   COVID SCREENING INFORMATION (3/20): You are scheduled for your COVID screening on: 3/20 at 10:25AM [facility] ([facility]) [address] Stay in the RIGHT lane and proceed under the brick awning and tell them you are there for pre-procedure testing Do NOT bring any pets with you to the  testing site   CATHETERIZATION INSTRUCTIONS (3/22): You are scheduled for a Cardiac Catheterization on Monday, March 22 with Dr. Sherren Mocha.  1. Please arrive at the [facility]  (Main Entrance A) at [facility]: [address] [place], [place] [address] at 10:00 AM (This time is two hours before your procedure to ensure your preparation). Free valet parking service is available. You are allowed ONE visitor in the waiting room during your procedure. Both you and your guest must wear masks. Special note: Every effort is made to have your procedure done on time. Please understand that emergencies sometimes delay scheduled procedures.  2. Diet: Do not eat solid foods after midnight.  You may have clear liquids until 5am upon the day of the procedure.  3. Labs: TODAY.  4. Medication instructions in preparation for your procedure:  1) DO NOT TAKE JARDIANCE the morning of your cath   2) DO NOT TAKE INSULIN the morning of your cath  3) DO NOT TAKE LASIX the morning of your cath  4) MAKE SURE TO TAKE ASPIRIN AND PLAVIX the morning of your cath  5) You may take your other meds as directed with sips of water  5. Plan for one night stay--bring personal belongings. 6. Bring a current list of your medications and current insurance cards. 7. You MUST have a responsible person to drive you home. 8. Someone MUST be with you the first 24 hours after you arrive home or your discharge will be delayed. 9. Please wear clothes that are easy to get on and off and wear slip-on shoes.  Thank you for allowing [place] to care for you!   -- [facility] services     Signed, Sherren Mocha, MD  05/02/2019 1:50 PM    [place]

## 2019-04-30 NOTE — Progress Notes (Signed)
Cardiology Office Note:    Date:  05/02/2019   ID:  Danny [name] November 20, 1955, MRN [id number]  PCP:  Toni Arthurs, NP  Cardiologist:  Sherren Mocha, MD  Electrophysiologist:  None   Referring MD: Toni Arthurs, NP   Chief Complaint  Patient presents with  . Chest Pain    History of Present Illness:    Danny [name] is a 64 y.o. male with a hx of   Past Medical History:  Diagnosis Date  . Acute MI, lateral wall, initial episode of care ([place]) 03/23/2014  . CAD (coronary artery disease)    a. s/p Lat MI 2/16 >> s/p CABG; b. LHC 1/17: mLAD 100, oD1 85%, oLCx 100, OM1 filled by collats from RPLB2, RPDA stent 95% ISR, L-LAD patent, S-OM1 occluded, S-D1 occluded >> PCI: Promus DES to RPDA  - residual CAD not amenable to PCI (dist vessel/diabetic appearance)   . Carotid stenosis    a. Carotid US 2/19 bilat ICA 1-39%  . Chronic combined systolic and diastolic CHF (congestive heart failure) ([place])   . Hyperlipidemia 10/16/2014  . Ischemic cardiomyopathy    a. Echo at [facility] 3/16:  EF 35-40%, diff HK, mild LVH, mild LAE, mild TR;  b. Echo 7/16: mild LVH, EF 50%, inf and lat HK-AK, Gr 2 DD, mod LAE, mild RVE, reduced RVSF, trivial TR, PASP 30 mmHg // c. Echo 5/17: moderate LVH, EF 45-50%, anteroseptal HK, grade 1 diastolic dysfunction, aortic sclerosis   . Type 2 diabetes mellitus ([place]) 03/23/2014    Past Surgical History:  Procedure Laterality Date  . CARDIAC CATHETERIZATION N/A 02/24/2015   Procedure: Left Heart Cath and Cors/Grafts Angiography;  Surgeon: Sherren Mocha, MD;  Location: [place] CV LAB;  Service: Cardiovascular;  Laterality: N/A;  . CARDIAC CATHETERIZATION N/A 02/24/2015   Procedure: Coronary Stent Intervention;  Surgeon: Sherren Mocha, MD;  Location: [place] [facility];  Service: Cardiovascular;  Laterality: N/A;  . CORONARY ARTERY BYPASS GRAFT N/A 03/28/2014   Procedure: CORONARY ARTERY BYPASS GRAFTING (CABG);  Surgeon: Melrose Nakayama, MD;   Location: [place];  Service: Open Heart Surgery;  Laterality: N/A;  . LEFT HEART CATHETERIZATION WITH CORONARY ANGIOGRAM N/A 03/23/2014   Procedure: LEFT HEART CATHETERIZATION WITH CORONARY ANGIOGRAM;  Surgeon: Blane Ohara, MD;  Location: [facility] CATH LAB;  Service: Cardiovascular;  Laterality: N/A;  . PERCUTANEOUS CORONARY STENT INTERVENTION (PCI-S)  02/24/2015   PDA  . TEE WITHOUT CARDIOVERSION N/A 03/28/2014   Procedure: TRANSESOPHAGEAL ECHOCARDIOGRAM (TEE);  Surgeon: Melrose Nakayama, MD;  Location: [place];  Service: Open Heart Surgery;  Laterality: N/A;    Current Medications: Current Meds  Medication Sig  . Cholecalciferol 50 MCG (2000 UT) CAPS Take 1 capsule by mouth daily.  . clopidogrel (PLAVIX) 75 MG tablet TAKE 1 TABLET BY MOUTH EVERY DAY  . Co-Enzyme Q10 200 MG CAPS Take 200 mg by mouth daily.  . Continuous Blood Gluc Receiver (FREESTYLE LIBRE 14 DAY READER) DEVI Use 1 kit as directed E11.65  . cyanocobalamin 1000 MCG tablet Take 1 tablet by mouth daily.  Marland Kitchen docusate sodium (COLACE) 100 MG capsule Take 1 capsule by mouth 2 (two) times daily.  . empagliflozin (JARDIANCE) 10 MG TABS tablet Take 10 mg by mouth daily.   Marland Kitchen erythromycin ophthalmic ointment Place 1 application into the left eye as needed (dry eye).   . furosemide (LASIX) 40 MG tablet TAKE 2 TABLETS BY MOUTH EVERY DAY IN THE MORNING AND TAKE 1 TABLET IN THE  EVENING  . HUMALOG 100 UNIT/ML injection SMARTSIG:0-150 Unit(s) SUB-Q Daily  . losartan (COZAAR) 50 MG tablet Take 1 tablet (50 mg total) by mouth daily.  . metoprolol succinate (TOPROL-XL) 100 MG 24 hr tablet TAKE 1 TABLET (100 MG TOTAL) BY MOUTH DAILY. TAKE WITH OR IMMEDIATELY FOLLOWING A MEAL.  . nitroGLYCERIN (NITROSTAT) 0.4 MG SL tablet Place 1 tablet (0.4 mg total) under the tongue every 5 (five) minutes as needed for chest pain.  . rosuvastatin (CRESTOR) 40 MG tablet TAKE 1 TABLET BY MOUTH EVERY DAY  . triamcinolone cream (KENALOG) 0.5 % Apply 1 application  topically daily as needed (nose).      Allergies:   Spironolactone   Social History   Socioeconomic History  . Marital status: Single    Spouse name: Not on file  . Number of children: Not on file  . Years of education: Not on file  . Highest education level: Not on file  Occupational History  . Not on file  Tobacco Use  . Smoking status: Never Smoker  . Smokeless tobacco: Never Used  Substance and Sexual Activity  . Alcohol use: No    Alcohol/week: 0.0 standard drinks  . Drug use: No  . Sexual activity: Not on file  Other Topics Concern  . Not on file  Social History Narrative   The patient is married. He has grown children. He is a lifelong nonsmoker. He works in Theatre manager.   Social Determinants of Health   Financial Resource Strain:   . Difficulty of Paying Living Expenses:   Food Insecurity:   . Worried About Charity fundraiser in the Last Year:   . Arboriculturist in the Last Year:   Transportation Needs:   . Film/video editor (Medical):   Marland Kitchen Lack of Transportation (Non-Medical):   Physical Activity:   . Days of Exercise per Week:   . Minutes of Exercise per Session:   Stress:   . Feeling of Stress :   Social Connections:   . Frequency of Communication with Friends and Family:   . Frequency of Social Gatherings with Friends and Family:   . Attends Religious Services:   . Active Member of Clubs or Organizations:   . Attends Archivist Meetings:   Marland Kitchen Marital Status:      Family History: The patient's family history includes Diabetes in his brother; Healthy in his father and mother; Heart attack in his father. There is no history of Stroke.  ROS:   Please see the history of present illness.    All other systems reviewed and are negative.  EKGs/Labs/Other Studies Reviewed:    The following studies were reviewed today: Cardiac Cath 02/24/2015: Conclusion   Ost Cx lesion, 100% stenosed.  LIMA is normal in caliber, and is anatomically  normal.  Widely patent LIMA-LAD  SVG .  Prox Graft lesion, 100% stenosed.  Prox Graft lesion, 100% stenosed.  Ost 1st Diag to 1st Diag lesion, 85% stenosed.  Mid LAD lesion, 100% stenosed.  RPDA lesion, 95% stenosed. Post intervention, there is a 0% residual stenosis. The lesion was previously treated with a stent (unknown type).   1. Severe native 3 vessel CAD with total occlusion of the LAD, severe diffuse stenosis of the first diagonal, total occlusion of the left circumflex, and severe stenosis of the right PDA 2. S/P aorto-coronary bypass surgery with continued patency of the LIMA - LAD and total occlusion of the SVG-OM and SVG-diagonal 3. Successful PCI  of the right PDA  Continue DAPT long-term. Residual CAD is not amenable to PCI secondary to diffuse distal vessel disease/diabetic pattern.   Diagnostic Dominance: Right Left Anterior Descending  Mid LAD lesion 100% stenosed  .  First Diagonal Branch  Ost 1st Diag to 1st Diag lesion 85% stenosed  Diffuse small vessel disease in a diabetic pattern  Left Circumflex  Ost Cx lesion 100% stenosed  .  Lateral First Obtuse Marginal Branch  Collaterals  Lat 1st Mrg filled by collaterals from 2nd RPL.    Right Coronary Artery  Right Posterior Descending Artery  RPDA lesion 95% stenosed  The lesion was previously treated with a stent (unknown type).  LIMA LIMA Graft To Mid LAD  LIMA is normal in caliber, and is anatomically normal. Widely patent LIMA-LAD  Graft To 1st Mrg  Prox Graft lesion 100% stenosed  100% at aortic anastomosis  Single Graft Graft To 1st Diag  SVG  Prox Graft lesion 100% stenosed  Aortic anastomosis - 100%  Intervention  RPDA lesion  Angioplasty  Pre-stent angioplasty was performed. A drug-eluting stent was placed. A post-stent angioplasty was not performed. Maximum pressure: 18 atm. The pre-interventional distal flow is normal (TIMI 3). The post-interventional distal flow is normal (TIMI 3). The  intervention was successful. No complications occured at this lesion. IC nitroglycerin was given. A JR4 guide catheter was used. A cougar wire is advanced cross the lesion. Heparin was used for anticoagulation. The patient has been treated chronically with clopidogrel and aspirin. The lesion is predilated with a 2.0 mm balloon, stented with a 2.5 x 12 mm Promus DES, and the stent is then postdilated with a 2.5 mm noncompliant balloon to 18 atm.  There is a 0% residual stenosis post intervention.  Coronary Diagrams  Diagnostic Dominance: Right  Intervention     EKG:  EKG is ordered today.  The ekg ordered today demonstrates NSR 70 bpm, ST-T wave abnormality consider inferior ischemia  Recent Labs: 04/30/2019: BUN 11; Creatinine, Ser 1.13; Hemoglobin 17.7; Platelets 249; Potassium 4.3; Sodium 141  Recent Lipid Panel    Component Value Date/Time   CHOL 123 09/02/2016 0846   TRIG 104 09/02/2016 0846   HDL 45 09/02/2016 0846   CHOLHDL 2.7 09/02/2016 0846   CHOLHDL 3.1 03/24/2014 0314   VLDL 16 03/24/2014 0314   LDLCALC 57 09/02/2016 0846    Physical Exam:    VS:  BP (!) 152/98   Pulse 78   Ht 5' 8"  (1.727 m)   Wt 263 lb 12.8 oz (119.7 kg)   SpO2 94%   BMI 40.11 kg/m     Wt Readings from Last 3 Encounters:  04/30/19 263 lb 12.8 oz (119.7 kg)  04/23/19 268 lb (121.6 kg)  02/28/19 266 lb 12.8 oz (121 kg)     GEN:  Well nourished, well developed in no acute distress HEENT: Normal NECK: No JVD; No carotid bruits LYMPHATICS: No lymphadenopathy CARDIAC: RRR, no murmurs, rubs, gallops RESPIRATORY:  Clear to auscultation without rales, wheezing or rhonchi  ABDOMEN: Soft, non-tender, non-distended MUSCULOSKELETAL:  No edema; No deformity  SKIN: Warm and dry NEUROLOGIC:  Alert and oriented x 3 PSYCHIATRIC:  Normal affect   ASSESSMENT:    1. Coronary artery disease involving native heart with other form of angina pectoris, unspecified vessel or lesion type ([place])   2. Chronic  combined systolic and diastolic congestive heart failure ([facility])    PLAN:    In order of problems listed above:  1.  The patient has developed progressive symptoms of lifestyle limiting angina and shortness of breath with activity.  He has known coronary artery disease status post CABG and has undergone PCI.  The patient is at significant risk of progressive coronary disease in the setting of obesity and diabetes.  I have recommended cardiac catheterization for definitive evaluation.  His last heart catheterization is reviewed and demonstrated early graft failure of his saphenous vein graft conduits.  At that time he underwent stenting of the right PDA.  I will add isosorbide to his medical regimen.  He will otherwise continue his current therapy which includes clopidogrel, metoprolol succinate, and a high intensity statin drug. I have reviewed the risks, indications, and alternatives to cardiac catheterization, possible angioplasty, and stenting with the patient. Risks include but are not limited to bleeding, infection, vascular injury, stroke, myocardial infection, arrhythmia, kidney injury, radiation-related injury in the case of prolonged fluoroscopy use, emergency cardiac surgery, and death. The patient understands the risks of serious complication is 1-2 in 1000 with diagnostic cardiac cath and 1-2% or less with angioplasty/stenting.  2. Limited by NYHA functional class IIIb symptoms.  Some of this is likely multifactorial in the setting of his coronary disease, weight gain, and deconditioning.  Will continue with his medical program as outlined.  We will assess hemodynamics at the time of his heart catheterization.   Medication Adjustments/Labs and Tests Ordered: Current medicines are reviewed at length with the patient today.  Concerns regarding medicines are outlined above.  Orders Placed This Encounter  Procedures  . Basic metabolic panel  . CBC with Differential/Platelet  . EKG 12-Lead    Meds ordered this encounter  Medications  . aspirin EC 81 MG tablet    Sig: Take 1 tablet (81 mg total) by mouth daily.    Dispense:  90 tablet    Refill:  3  . isosorbide mononitrate (IMDUR) 30 MG 24 hr tablet    Sig: Take 1 tablet (30 mg total) by mouth daily.    Dispense:  90 tablet    Refill:  3  . pantoprazole (PROTONIX) 40 MG tablet    Sig: Take 1 tablet (40 mg total) by mouth daily.    Dispense:  90 tablet    Refill:  3    Patient Instructions  Medication Instructions:  1) START ASPIRIN 81 mg daily 2) STOP PRILOSEC 3) START PANTOPRAZOLE 40 mg daily 4) START IMDUR 30 mg daily  Labwork: TODAY: BMET, CBC  Testing/Procedures: Your physician has requested that you have a cardiac catheterization. Cardiac catheterization is used to diagnose and/or treat various heart conditions. Doctors may recommend this procedure for a number of different reasons. The most common reason is to evaluate chest pain. Chest pain can be a symptom of coronary artery disease (CAD), and cardiac catheterization can show whether plaque is narrowing or blocking your heart's arteries. This procedure is also used to evaluate the valves, as well as measure the blood flow and oxygen levels in different parts of your heart. For further information please visit [email or web address]. Please follow instruction sheet, as given.  Follow-Up: You have an appointment scheduled 3/31 at 11:15AM with Scott Weaver, PA.   COVID SCREENING INFORMATION (3/20): You are scheduled for your COVID screening on: 3/20 at 10:25AM [facility] ([facility]) [address] Stay in the RIGHT lane and proceed under the brick awning and tell them you are there for pre-procedure testing Do NOT bring any pets with you to the   testing site   CATHETERIZATION INSTRUCTIONS (3/22): You are scheduled for a Cardiac Catheterization on Monday, March 22 with Dr. Sherren Mocha.  1. Please arrive at the [facility]  (Main Entrance A) at [facility]: [address] [place], [place] [address] at 10:00 AM (This time is two hours before your procedure to ensure your preparation). Free valet parking service is available. You are allowed ONE visitor in the waiting room during your procedure. Both you and your guest must wear masks. Special note: Every effort is made to have your procedure done on time. Please understand that emergencies sometimes delay scheduled procedures.  2. Diet: Do not eat solid foods after midnight.  You may have clear liquids until 5am upon the day of the procedure.  3. Labs: TODAY.  4. Medication instructions in preparation for your procedure:  1) DO NOT TAKE JARDIANCE the morning of your cath   2) DO NOT TAKE INSULIN the morning of your cath  3) DO NOT TAKE LASIX the morning of your cath  4) MAKE SURE TO TAKE ASPIRIN AND PLAVIX the morning of your cath  5) You may take your other meds as directed with sips of water  5. Plan for one night stay--bring personal belongings. 6. Bring a current list of your medications and current insurance cards. 7. You MUST have a responsible person to drive you home. 8. Someone MUST be with you the first 24 hours after you arrive home or your discharge will be delayed. 9. Please wear clothes that are easy to get on and off and wear slip-on shoes.  Thank you for allowing [place] to care for you!   -- [facility] Invasive Cardiovascular services     Signed, Sherren Mocha, MD  05/02/2019 1:50 PM    [place]

## 2019-04-30 NOTE — Patient Instructions (Addendum)
Medication Instructions:  1) START ASPIRIN 81 mg daily 2) STOP PRILOSEC 3) START PANTOPRAZOLE 40 mg daily 4) START IMDUR 30 mg daily  Labwork: TODAY: BMET, CBC  Testing/Procedures: Your physician has requested that you have a cardiac catheterization. Cardiac catheterization is used to diagnose and/or treat various heart conditions. Doctors may recommend this procedure for a number of different reasons. The most common reason is to evaluate chest pain. Chest pain can be a symptom of coronary artery disease (CAD), and cardiac catheterization can show whether plaque is narrowing or blocking your heart's arteries. This procedure is also used to evaluate the valves, as well as measure the blood flow and oxygen levels in different parts of your heart. For further information please visit [email or web address] Please follow instruction sheet, as given.  Follow-Up: You have an appointment scheduled 3/31 at 11:15AM with Tereso Newcomer, PA.   COVID SCREENING INFORMATION (3/20): You are scheduled for your COVID screening on: 3/20 at 10:25AM [facility] (old [facility]) [address] Stay in the RIGHT lane and proceed under the brick awning and tell them you are there for pre-procedure testing Do NOT bring any pets with you to the testing site   CATHETERIZATION INSTRUCTIONS (3/22): You are scheduled for a Cardiac Catheterization on Monday, March 22 with Dr. Tonny Bollman.  1. Please arrive at the [facility] (Main Entrance A) at [facility]: [address] [place], [place] [address] at 10:00 AM (This time is two hours before your procedure to ensure your preparation). Free valet parking service is available. You are allowed ONE visitor in the waiting room during your procedure. Both you and your guest must wear masks. Special note: Every effort is made to have your procedure done on time. Please understand that emergencies sometimes delay scheduled procedures.  2.  Diet: Do not eat solid foods after midnight.  You may have clear liquids until 5am upon the day of the procedure.  3. Labs: TODAY.  4. Medication instructions in preparation for your procedure:  1) DO NOT TAKE JARDIANCE the morning of your cath   2) DO NOT TAKE INSULIN the morning of your cath  3) DO NOT TAKE LASIX the morning of your cath  4) MAKE SURE TO TAKE ASPIRIN AND PLAVIX the morning of your cath  5) You may take your other meds as directed with sips of water  5. Plan for one night stay--bring personal belongings. 6. Bring a current list of your medications and current insurance cards. 7. You MUST have a responsible person to drive you home. 8. Someone MUST be with you the first 24 hours after you arrive home or your discharge will be delayed. 9. Please wear clothes that are easy to get on and off and wear slip-on shoes.  Thank you for allowing [place] to care for you!   -- [place] [facility]

## 2019-05-01 LAB — BASIC METABOLIC PANEL
BUN/Creatinine Ratio: 10 (ref 10–24)
BUN: 11 mg/dL (ref 8–27)
CO2: 20 mmol/L (ref 20–29)
Calcium: 9.8 mg/dL (ref 8.6–10.2)
Chloride: 102 mmol/L (ref 96–106)
Creatinine, Ser: 1.13 mg/dL (ref 0.76–1.27)
GFR calc Af Amer: 80 mL/min/{1.73_m2} (ref >59)
GFR calc non Af Amer: 69 mL/min/{1.73_m2} (ref >59)
Glucose: 171 mg/dL — ABNORMAL HIGH (ref 65–99)
Potassium: 4.3 mmol/L (ref 3.5–5.2)
Sodium: 141 mmol/L (ref 134–144)

## 2019-05-01 LAB — CBC WITH DIFFERENTIAL/PLATELET
Basophils Absolute: 0.1 10*3/uL (ref 0.0–0.2)
Basos: 1 %
EOS (ABSOLUTE): 0.2 10*3/uL (ref 0.0–0.4)
Eos: 2 %
Hematocrit: 48.1 % (ref 37.5–51.0)
Hemoglobin: 17.7 g/dL (ref 13.0–17.7)
Immature Grans (Abs): 0 10*3/uL (ref 0.0–0.1)
Immature Granulocytes: 0 %
Lymphocytes Absolute: 2.6 10*3/uL (ref 0.7–3.1)
Lymphs: 36 %
MCH: 31.6 pg (ref 26.6–33.0)
MCHC: 36.8 g/dL — ABNORMAL HIGH (ref 31.5–35.7)
MCV: 86 fL (ref 79–97)
Monocytes Absolute: 0.6 10*3/uL (ref 0.1–0.9)
Monocytes: 9 %
Neutrophils Absolute: 3.8 10*3/uL (ref 1.4–7.0)
Neutrophils: 52 %
Platelets: 249 10*3/uL (ref 150–450)
RBC: 5.61 x10E6/uL (ref 4.14–5.80)
RDW: 14.1 % (ref 11.6–15.4)
WBC: 7.3 10*3/uL (ref 3.4–10.8)

## 2019-05-02 ENCOUNTER — Encounter: Payer: Self-pay | Admitting: Cardiovascular Disease

## 2019-05-02 MED ORDER — ESOMEPRAZOLE MAGNESIUM 40 MG PO CPDR: 40.0000 mg | DELAYED_RELEASE_CAPSULE | Freq: Every day | ORAL | Status: DC

## 2019-05-02 NOTE — Telephone Encounter (Signed)
Called to clarify MyChart messages.  The patient is taking Nexium 40 mg daily and his diarrhea has improved. He took a half tablet of Imdur today and his headache was minimal. He understands he may take tomorrow's dose with Tylenol if he wishes, but the HAs should continue to improve. He will continue to check his BP and call if it is consistently over 140/90. He was grateful for assistance.

## 2019-05-03 ENCOUNTER — Telehealth: Payer: Self-pay | Admitting: *Deleted

## 2019-05-03 NOTE — Telephone Encounter (Signed)
Pt contacted pre-catheterization scheduled at [facility] for: Monday May 07, 2019 12 Noon Verified arrival time and place: [facility] Main Entrance A [facility]) at: 10 AM   No solid food after midnight prior to cath, clear liquids until 5 AM day of procedure.  Hold: Jardiance-AM of procedure. Lasix-AM of procedure. Insulin-AM of procedure  Except hold medications AM meds can be  taken pre-cath with sip of water including: ASA 81 mg Plavix 75 mg  Confirmed patient has responsible adult to drive home post procedure and observe 24 hours after arriving home: yes  Currently, due to Covid-19 pandemic, only one person will be allowed with patient. Must be the same person for patient's entire stay and will be required to wear a mask. They will be asked to wait in the waiting room for the duration of the patient's stay.  Patients are required to wear a mask when they enter the hospital.     COVID-19 Pre-Screening Questions:  . In the past 7 to 10 days have you had a cough,  shortness of breath, headache, congestion, fever (100 or greater) body aches, chills, sore throat, or sudden loss of taste or sense of smell? no . Have you been around anyone with known Covid 19 in the past 7-10 days? no . Have you been around anyone who is awaiting Covid 19 test results in the past 7 to 10 days? no . Have you been around anyone who has been exposed to Covid 19, or has mentioned symptoms of Covid 19 within the past 7 to 10 days? No   I reviewed procedure/mask/visitor instructions, COVID-19 screening questions with patient, he verbalized understanding, thanked me for call.

## 2019-05-05 ENCOUNTER — Other Ambulatory Visit (HOSPITAL_COMMUNITY)
Admission: RE | Admit: 2019-05-05 | Discharge: 2019-05-05 | Disposition: A | Discharge status: Home / Self Care | Payer: Medicare Other | Source: Ambulatory Visit | Attending: Cardiovascular Disease | Admitting: Cardiovascular Disease

## 2019-05-05 DIAGNOSIS — Z20822 Contact with and (suspected) exposure to covid-19: Secondary | ICD-10-CM | POA: Diagnosis not present

## 2019-05-05 DIAGNOSIS — Z01812 Encounter for preprocedural laboratory examination: Secondary | ICD-10-CM | POA: Insufficient documentation

## 2019-05-05 LAB — SARS CORONAVIRUS 2 (TAT 6-24 HRS): SARS Coronavirus 2: NEGATIVE

## 2019-05-07 ENCOUNTER — Other Ambulatory Visit: Payer: Self-pay

## 2019-05-07 ENCOUNTER — Ambulatory Visit (HOSPITAL_COMMUNITY)
Admission: RE | Admit: 2019-05-07 | Discharge: 2019-05-07 | Disposition: A | Discharge status: Home / Self Care | Payer: Medicare Other | Attending: Cardiovascular Disease | Admitting: Cardiovascular Disease

## 2019-05-07 ENCOUNTER — Encounter (HOSPITAL_COMMUNITY)
Admission: RE | Disposition: A | Discharge status: Home / Self Care | Payer: Self-pay | Source: Home / Self Care | Attending: Cardiovascular Disease

## 2019-05-07 DIAGNOSIS — Z951 Presence of aortocoronary bypass graft: Secondary | ICD-10-CM | POA: Diagnosis not present

## 2019-05-07 DIAGNOSIS — I255 Ischemic cardiomyopathy: Secondary | ICD-10-CM | POA: Diagnosis not present

## 2019-05-07 DIAGNOSIS — E119 Type 2 diabetes mellitus without complications: Secondary | ICD-10-CM | POA: Diagnosis not present

## 2019-05-07 DIAGNOSIS — Z794 Long term (current) use of insulin: Secondary | ICD-10-CM | POA: Diagnosis not present

## 2019-05-07 DIAGNOSIS — Z7902 Long term (current) use of antithrombotics/antiplatelets: Secondary | ICD-10-CM | POA: Diagnosis not present

## 2019-05-07 DIAGNOSIS — Z888 Allergy status to other drugs, medicaments and biological substances status: Secondary | ICD-10-CM | POA: Diagnosis not present

## 2019-05-07 DIAGNOSIS — Z955 Presence of coronary angioplasty implant and graft: Secondary | ICD-10-CM | POA: Insufficient documentation

## 2019-05-07 DIAGNOSIS — Z833 Family history of diabetes mellitus: Secondary | ICD-10-CM | POA: Diagnosis not present

## 2019-05-07 DIAGNOSIS — Z8249 Family history of ischemic heart disease and other diseases of the circulatory system: Secondary | ICD-10-CM | POA: Insufficient documentation

## 2019-05-07 DIAGNOSIS — I5042 Chronic combined systolic (congestive) and diastolic (congestive) heart failure: Secondary | ICD-10-CM | POA: Insufficient documentation

## 2019-05-07 DIAGNOSIS — I25709 Atherosclerosis of coronary artery bypass graft(s), unspecified, with unspecified angina pectoris: Secondary | ICD-10-CM

## 2019-05-07 DIAGNOSIS — I6523 Occlusion and stenosis of bilateral carotid arteries: Secondary | ICD-10-CM | POA: Insufficient documentation

## 2019-05-07 DIAGNOSIS — I252 Old myocardial infarction: Secondary | ICD-10-CM | POA: Insufficient documentation

## 2019-05-07 DIAGNOSIS — I25119 Atherosclerotic heart disease of native coronary artery with unspecified angina pectoris: Secondary | ICD-10-CM | POA: Diagnosis not present

## 2019-05-07 DIAGNOSIS — I209 Angina pectoris, unspecified: Secondary | ICD-10-CM | POA: Diagnosis present

## 2019-05-07 DIAGNOSIS — E785 Hyperlipidemia, unspecified: Secondary | ICD-10-CM | POA: Diagnosis not present

## 2019-05-07 DIAGNOSIS — Z79899 Other long term (current) drug therapy: Secondary | ICD-10-CM | POA: Diagnosis not present

## 2019-05-07 HISTORY — PX: LEFT HEART CATH AND CORS/GRAFTS ANGIOGRAPHY: CATH118250

## 2019-05-07 LAB — GLUCOSE, CAPILLARY: Glucose-Capillary: 214 mg/dL — ABNORMAL HIGH (ref 70–99)

## 2019-05-07 SURGERY — LEFT HEART CATH AND CORS/GRAFTS ANGIOGRAPHY
Anesthesia: LOCAL

## 2019-05-07 MED ORDER — SODIUM CHLORIDE 0.9 % IV SOLN: 250.0000 mL | INTRAVENOUS | Status: DC | PRN

## 2019-05-07 MED ORDER — CLOPIDOGREL BISULFATE 75 MG PO TABS: 75.0000 mg | ORAL_TABLET | ORAL | Status: DC

## 2019-05-07 MED ORDER — SODIUM CHLORIDE 0.9% FLUSH: 3.0000 mL | INTRAVENOUS | Status: DC | PRN

## 2019-05-07 MED ORDER — FENTANYL CITRATE (PF) 100 MCG/2ML IJ SOLN
INTRAMUSCULAR | Status: DC | PRN
  Administered 2019-05-07: 25 ug via INTRAVENOUS

## 2019-05-07 MED ORDER — ONDANSETRON HCL 4 MG/2ML IJ SOLN: 4.0000 mg | Freq: Four times a day (QID) | INTRAMUSCULAR | Status: DC | PRN

## 2019-05-07 MED ORDER — LABETALOL HCL 5 MG/ML IV SOLN: 10.0000 mg | INTRAVENOUS | Status: DC | PRN

## 2019-05-07 MED ORDER — LIDOCAINE HCL (PF) 1 % IJ SOLN
INTRAMUSCULAR | Status: AC
  Filled 2019-05-07: qty 30

## 2019-05-07 MED ORDER — ACETAMINOPHEN 325 MG PO TABS: 650.0000 mg | ORAL_TABLET | ORAL | Status: DC | PRN

## 2019-05-07 MED ORDER — HEPARIN (PORCINE) IN NACL 1000-0.9 UT/500ML-% IV SOLN
INTRAVENOUS | Status: DC | PRN
  Administered 2019-05-07 (×2): 500 mL

## 2019-05-07 MED ORDER — SODIUM CHLORIDE 0.9 % WEIGHT BASED INFUSION: 1.0000 mL/kg/h | INTRAVENOUS | Status: DC

## 2019-05-07 MED ORDER — SODIUM CHLORIDE 0.9 % WEIGHT BASED INFUSION
3.0000 mL/kg/h | INTRAVENOUS | Status: AC
  Administered 2019-05-07: 3 mL/kg/h via INTRAVENOUS

## 2019-05-07 MED ORDER — HEPARIN SODIUM (PORCINE) 1000 UNIT/ML IJ SOLN
INTRAMUSCULAR | Status: DC | PRN
  Administered 2019-05-07: 6000 [IU] via INTRAVENOUS

## 2019-05-07 MED ORDER — SODIUM CHLORIDE 0.9% FLUSH: 3.0000 mL | Freq: Two times a day (BID) | INTRAVENOUS | Status: DC

## 2019-05-07 MED ORDER — ASPIRIN 81 MG PO CHEW: 81.0000 mg | CHEWABLE_TABLET | ORAL | Status: DC

## 2019-05-07 MED ORDER — IOHEXOL 350 MG/ML SOLN
INTRAVENOUS | Status: DC | PRN
  Administered 2019-05-07: 14:00:00 55 mL

## 2019-05-07 MED ORDER — LIDOCAINE HCL (PF) 1 % IJ SOLN
INTRAMUSCULAR | Status: DC | PRN
  Administered 2019-05-07: 2 mL

## 2019-05-07 MED ORDER — IOHEXOL 350 MG/ML SOLN
INTRAVENOUS | Status: AC
  Filled 2019-05-07: qty 1

## 2019-05-07 MED ORDER — FENTANYL CITRATE (PF) 100 MCG/2ML IJ SOLN
INTRAMUSCULAR | Status: AC
  Filled 2019-05-07: qty 2

## 2019-05-07 MED ORDER — VERAPAMIL HCL 2.5 MG/ML IV SOLN
INTRAVENOUS | Status: AC
  Filled 2019-05-07: qty 2

## 2019-05-07 MED ORDER — LABETALOL HCL 5 MG/ML IV SOLN
INTRAVENOUS | Status: DC | PRN
  Administered 2019-05-07: 20 mg via INTRAVENOUS

## 2019-05-07 MED ORDER — LABETALOL HCL 5 MG/ML IV SOLN
INTRAVENOUS | Status: AC
  Filled 2019-05-07: qty 4

## 2019-05-07 MED ORDER — MIDAZOLAM HCL 2 MG/2ML IJ SOLN
INTRAMUSCULAR | Status: DC | PRN
  Administered 2019-05-07: 2 mg via INTRAVENOUS

## 2019-05-07 MED ORDER — HYDRALAZINE HCL 20 MG/ML IJ SOLN: 10.0000 mg | INTRAMUSCULAR | Status: DC | PRN

## 2019-05-07 MED ORDER — VERAPAMIL HCL 2.5 MG/ML IV SOLN
INTRAVENOUS | Status: DC | PRN
  Administered 2019-05-07: 10 mL via INTRA_ARTERIAL

## 2019-05-07 MED ORDER — HEPARIN SODIUM (PORCINE) 1000 UNIT/ML IJ SOLN
INTRAMUSCULAR | Status: AC
  Filled 2019-05-07: qty 1

## 2019-05-07 MED ORDER — HEPARIN (PORCINE) IN NACL 1000-0.9 UT/500ML-% IV SOLN
INTRAVENOUS | Status: AC
  Filled 2019-05-07: qty 1000

## 2019-05-07 MED ORDER — MIDAZOLAM HCL 2 MG/2ML IJ SOLN
INTRAMUSCULAR | Status: AC
  Filled 2019-05-07: qty 2

## 2019-05-07 SURGICAL SUPPLY — 9 items
CATH INFINITI 5FR MULTPACK ANG (CATHETERS) ×2 IMPLANT
DEVICE RAD COMP TR BAND LRG (VASCULAR PRODUCTS) ×2 IMPLANT
GLIDESHEATH SLEND SS 6F .021 (SHEATH) ×2 IMPLANT
GUIDEWIRE INQWIRE 1.5J.035X260 (WIRE) ×1 IMPLANT
INQWIRE 1.5J .035X260CM (WIRE) ×2
KIT HEART LEFT (KITS) ×2 IMPLANT
PACK CARDIAC CATHETERIZATION (CUSTOM PROCEDURE TRAY) ×2 IMPLANT
TRANSDUCER W/STOPCOCK (MISCELLANEOUS) ×2 IMPLANT
TUBING CIL FLEX 10 FLL-RA (TUBING) ×2 IMPLANT

## 2019-05-07 NOTE — Discharge Instructions (Signed)
Drink plenty of fluids for 48 hours and keep wrist elevated at heart level for 24 hours  Radial Site Care   This sheet gives you information about how to care for yourself after your procedure. Your health care provider may also give you more specific instructions. If you have problems or questions, contact your health care provider. What can I expect after the procedure? After the procedure, it is common to have:  Bruising and tenderness at the catheter insertion area. Follow these instructions at home: Medicines  Take over-the-counter and prescription medicines only as told by your health care provider. Insertion site care 1. Follow instructions from your health care provider about how to take care of your insertion site. Make sure you: ? Wash your hands with soap and water before you change your bandage (dressing). If soap and water are not available, use hand sanitizer. ? Remove your dressing as told by your health care provider. In 24 hours 2. Check your insertion site every day for signs of infection. Check for: ? Redness, swelling, or pain. ? Fluid or blood. ? Pus or a bad smell. ? Warmth. 3. Do not take baths, swim, or use a hot tub until your health care provider approves. 4. You may shower 24-48 hours after the procedure, or as directed by your health care provider. ? Remove the dressing and gently wash the site with plain soap and water. ? Pat the area dry with a clean towel. ? Do not rub the site. That could cause bleeding. 5. Do not apply powder or lotion to the site. Activity   1. For 24 hours after the procedure, or as directed by your health care provider: ? Do not flex or bend the affected arm. ? Do not push or pull heavy objects with the affected arm. ? Do not drive yourself home from the hospital or clinic. You may drive 24 hours after the procedure unless your health care provider tells you not to. ? Do not operate machinery or power tools. 2. Do not lift  anything that is heavier than 10 lb (4.5 kg), or the limit that you are told, until your health care provider says that it is safe.  For 4 days 3. Ask your health care provider when it is okay to: ? Return to work or school. ? Resume usual physical activities or sports. ? Resume sexual activity. General instructions  If the catheter site starts to bleed, raise your arm and put firm pressure on the site. If the bleeding does not stop, get help right away. This is a medical emergency.  If you went home on the same day as your procedure, a responsible adult should be with you for the first 24 hours after you arrive home.  Keep all follow-up visits as told by your health care provider. This is important. Contact a health care provider if:  You have a fever.  You have redness, swelling, or yellow drainage around your insertion site. Get help right away if:  You have unusual pain at the radial site.  The catheter insertion area swells very fast.  The insertion area is bleeding, and the bleeding does not stop when you hold steady pressure on the area.  Your arm or hand becomes pale, cool, tingly, or numb. These symptoms may represent a serious problem that is an emergency. Do not wait to see if the symptoms will go away. Get medical help right away. Call your local emergency services (911 in the [place] Do   not drive yourself to the hospital. Summary  After the procedure, it is common to have bruising and tenderness at the site.  Follow instructions from your health care provider about how to take care of your radial site wound. Check the wound every day for signs of infection.  Do not lift anything that is heavier than 10 lb (4.5 kg), or the limit that you are told, until your health care provider says that it is safe. This information is not intended to replace advice given to you by your health care provider. Make sure you discuss any questions you have with your health care  provider. Document Revised: 03/09/2017 Document Reviewed: 03/09/2017 Elsevier Patient Education  2020 Elsevier Inc.  

## 2019-05-07 NOTE — Interval H&P Note (Signed)
Cath Lab Visit (complete for each Cath Lab visit)  Clinical Evaluation Leading to the Procedure:   ACS: No.  Non-ACS:    Anginal Classification: CCS III  Anti-ischemic medical therapy: Minimal Therapy (1 class of medications)  Non-Invasive Test Results: No non-invasive testing performed  Prior CABG: Previous CABG      History and Physical Interval Note:  05/07/2019 1:47 PM  Danny [name]  has presented today for surgery, with the diagnosis of cad with angina.  The various methods of treatment have been discussed with the patient and family. After consideration of risks, benefits and other options for treatment, the patient has consented to  Procedure(s): LEFT HEART CATH AND CORS/GRAFTS ANGIOGRAPHY (N/A) as a surgical intervention.  The patient's history has been reviewed, patient examined, no change in status, stable for surgery.  I have reviewed the patient's chart and labs.  Questions were answered to the patient's satisfaction.     Tonny Bollman

## 2019-05-09 ENCOUNTER — Other Ambulatory Visit: Payer: Medicare Other

## 2019-05-15 NOTE — Progress Notes (Signed)
Cardiology Office Note:    Date:  05/16/2019   ID:  [name], [place] [birth year], MRN [id number]  PCP:  Toni Arthurs, NP  Cardiologist:  Sherren Mocha, MD  Electrophysiologist:  None   Referring MD: Toni Arthurs, NP   Chief Complaint:  Hospitalization Follow-up (s/p cardiac cath)    Patient Profile:    Danny [name] is a 64 y.o. male with:    Coronary artery disease ? S/p Lat MI in 2016 >> CABG ? Cath 02/2015: L-LAD ok, S-OM and S-Dx occluded; RPDA stent 95 ISR >> s/p DES to RPDA ? Diff diabetic vessel dz  Combined systolic and diastolic CHF ? EF 35-40 ? Improved >> Echocardiogram 06/2017: EF 55  Ischemic CM  Diabetes mellitus   Hypertension   Hyperlipidemia   Carotid artery dz ? [place] 2/19 - Bilat ICA 1-39  Prior CV studies: Cardiac catheterization 06/02/19 LAD mid 100; D1 ost 85 LCx ost 100 RCA irregs; PDA stent patent S-OM1 100 S-D1 100 L-LAD patent  Echo 07/07/17 Mild LVH, EF 50-55, Gr 1 DD, MAC, inf-lat HK  Echo 5/17:  moderate LVH, EF 45-50%, anteroseptal HK, grade 1 diastolic dysfunction, aortic sclerosis   LHC1/9/17 LAD mid 100%, D1 ostial 85% LCx ostial 100%, Lat OM1 filled by collats from RPLB2 RCA with RPDA stent with 95% ISR L-LAD patent S-OM1 occluded S-D1 occluded PCI:2.5 x 12 mm Promus DES to RPDA 1. Severe native 3 vessel CAD with total occlusion of the LAD, severe diffuse stenosis of the first diagonal, total occlusion of the left circumflex, and severe stenosis of the right PDA 2. S/P aorto-coronary bypass surgery with continued patency of the LIMA - LAD and total occlusion of the SVG-OM and SVG-diagonal 3. Successful PCI of the right PDA Continue DAPT long-term. Residual CAD is not amenable to PCI secondary to diffuse distal vessel disease/diabetic pattern.  Myoview 02/13/15 This is a high risk study with a prior MI in the basal and mid inferior, inferolateral (and possibly basal anterior) walls with a large  area, severe severity ischemia in the basal and mid anterolateral, anterior, apical lateral, anterior, inferior walls and in the true apex. EF 31%  Echo 08/26/14 Mild LVH, EF 50%, inferior and lateral HK-AK, Gr 2 DD, mod LAE, mild RVE, reduced RV function, trivial TR, PASP 30 mmHg  Echo at [facility] 04/16/14 Mod L Eff, EF 35-40%, Impaired relaxation, Diff HK - Ant and AS best preserved, Mild LVH, Mild LAE, Mild TR  Carotid US 03/26/14 1-39% internal carotid artery stenosis bilaterally  Echo 04/02/14 EF 50%, mild LVH, inferior and inferolateral HK, mild MR, LA upper limits of normal, normal RV function, mild TR  Cardiac cath 03/23/14 KD:XIPJAS.  NKN:LZJQ 50%, mid 75-80%.D1 trifurcates into 3 small subbranches. Ostium has 90% stenosis. I BHA:LPFX 90%, OM1 80% stenosis, OM2 80% stenosis. RCA:PDA 80%  EF:Anterolateral wall is akinetic.EF is 40-45%.  History of Present Illness:    Danny [name] was last seen by Dr. Burt Knack 04/30/2019 with complaints of progressive angina.  Cardiac catheterization was recommended.  Cardiac catheterization 2019-06-02 demonstrated severe 2 vessel coronary artery disease with occlusion of the LCx and LAD, patent RCA and patent PDA stent.  The L-LAD was patent the S-D1 and S-OM1 were known to be occluded.  Med Rx was continued.    He returns for follow-up.  Since his heart catheterization, he has done well without recurrent chest symptoms.  He has not had significant shortness of breath.  He has not had orthopnea, syncope  or significant leg swelling.  He did have some headache related to the isosorbide initially.  However, this is now resolved.  He continues to struggle with trying to lose weight.  He feels that he is doing everything he can in regards to his diet.  Past Medical History:  Diagnosis Date  . Acute MI, lateral wall, initial episode of care ([place]) 03/23/2014  . CAD (coronary artery disease)    a. s/p Lat MI 2/16 >> s/p CABG; b. LHC 1/17: mLAD 100,  oD1 85%, oLCx 100, OM1 filled by collats from RPLB2, RPDA stent 95% ISR, L-LAD patent, S-OM1 occluded, S-D1 occluded >> PCI: Promus DES to RPDA  - residual CAD not amenable to PCI (dist vessel/diabetic appearance)   . Carotid stenosis    a. Carotid US 2/19 bilat ICA 1-39%  . Chronic combined systolic and diastolic CHF (congestive heart failure) ([place])   . Hyperlipidemia 10/16/2014  . Ischemic cardiomyopathy    a. Echo at [facility] 3/16:  EF 35-40%, diff HK, mild LVH, mild LAE, mild TR;  b. Echo 7/16: mild LVH, EF 50%, inf and lat HK-AK, Gr 2 DD, mod LAE, mild RVE, reduced RVSF, trivial TR, PASP 30 mmHg // c. Echo 5/17: moderate LVH, EF 45-50%, anteroseptal HK, grade 1 diastolic dysfunction, aortic sclerosis   . Type 2 diabetes mellitus ([place]) 03/23/2014    Current Medications: Current Meds  Medication Sig  . aspirin EC 81 MG tablet Take 1 tablet (81 mg total) by mouth daily.  . Cholecalciferol 50 MCG (2000 UT) CAPS Take 2,000 Units by mouth daily.   . clopidogrel (PLAVIX) 75 MG tablet TAKE 1 TABLET BY MOUTH EVERY DAY  . Continuous Blood Gluc Receiver (FREESTYLE LIBRE 14 DAY READER) DEVI Use 1 kit as directed E11.65  . cyanocobalamin 1000 MCG tablet Take 1,000 mcg by mouth daily.   Marland Kitchen erythromycin ophthalmic ointment Place 1 application into the left eye as needed (eye infection).   Marland Kitchen esomeprazole (NEXIUM) 40 MG capsule Take 1 capsule (40 mg total) by mouth daily at 12 noon.  . furosemide (LASIX) 40 MG tablet Take 40 mg by mouth daily.  . Glycerin-Hypromellose-PEG 400 ([facility] DRY EYE RELIEF) 0.2-0.2-1 % SOLN Place 1 drop into both eyes daily as needed.  Marland Kitchen HUMALOG 100 UNIT/ML injection INSULIN PUMP/ use 5  bottles a month  . isosorbide mononitrate (IMDUR) 30 MG 24 hr tablet Take 1 tablet (30 mg total) by mouth daily.  Marland Kitchen losartan (COZAAR) 50 MG tablet Take 1 tablet (50 mg total) by mouth daily.  . metoprolol succinate (TOPROL-XL) 100 MG 24 hr tablet TAKE 1 TABLET (100 MG TOTAL) BY MOUTH DAILY. TAKE WITH OR  IMMEDIATELY FOLLOWING A MEAL.  . nitroGLYCERIN (NITROSTAT) 0.4 MG SL tablet Place 1 tablet (0.4 mg total) under the tongue every 5 (five) minutes as needed for chest pain.  . pantoprazole (PROTONIX) 40 MG tablet Take 40 mg by mouth daily.  . rosuvastatin (CRESTOR) 40 MG tablet TAKE 1 TABLET BY MOUTH EVERY DAY  . triamcinolone cream (KENALOG) 0.5 % Apply 1 application topically daily as needed (nose).      Allergies:   Spironolactone   Social History   Tobacco Use  . Smoking status: Never Smoker  . Smokeless tobacco: Never Used  Substance Use Topics  . Alcohol use: No    Alcohol/week: 0.0 standard drinks  . Drug use: No     Family Hx: The patient's family history includes Diabetes in his brother; Healthy in his father and  mother; Heart attack in his father. There is no history of Stroke.  ROS   EKGs/Labs/Other Test Reviewed:    EKG:  EKG is   ordered today.  The ekg ordered today demonstrates normal sinus rhythm, heart rate 71, leftward axis, poor R wave progression, nonspecific ST-T wave changes, PVC, QTC 473  Recent Labs: 04/30/2019: BUN 11; Creatinine, Ser 1.13; Hemoglobin 17.7; Platelets 249; Potassium 4.3; Sodium 141   Recent Lipid Panel Lab Results  Component Value Date/Time   CHOL 123 09/02/2016 08:46 AM   TRIG 104 09/02/2016 08:46 AM   HDL 45 09/02/2016 08:46 AM   CHOLHDL 2.7 09/02/2016 08:46 AM   CHOLHDL 3.1 03/24/2014 03:14 AM   LDLCALC 57 09/02/2016 08:46 AM    Physical Exam:    VS:  BP 124/78   Pulse 71   Ht _0  (1.727 m)   Wt 265 lb 1.9 oz (120.3 kg)   SpO2 94%   BMI 40.31 kg/m     Wt Readings from Last 3 Encounters:  05/16/19 265 lb 1.9 oz (120.3 kg)  05/07/19 269 lb (122 kg)  04/30/19 263 lb 12.8 oz (119.7 kg)     Constitutional:      Appearance: Healthy appearance. Not in distress.  Neck:     Vascular: JVD normal.  Pulmonary:     Effort: Pulmonary effort is normal.     Breath sounds: No wheezing. No rales.  Cardiovascular:     Normal  rate. Regular rhythm. Normal S1. Normal S2.     Murmurs: There is no murmur.  Edema:    Peripheral edema absent.  Abdominal:     Palpations: Abdomen is soft. There is no hepatomegaly.  Musculoskeletal:     Comments: Left wrist without hematoma Skin:    General: Skin is warm and dry.  Neurological:     General: No focal deficit present.     Mental Status: Alert and oriented to person, place and time.     Cranial Nerves: Cranial nerves are intact.      ASSESSMENT & PLAN:    1. Coronary artery disease involving native coronary artery of native heart with angina pectoris ([place]) History of CABG in 2016 following a lateral STEMI.  He underwent DES to the [place] in 2017.  Recent cardiac catheterization demonstrated an occluded vein graft to the OM1 and vein graft to the first diagonal.  LIMA-LAD was patent and the stent in the PDA was patent.  Medical therapy has been recommended.  He is doing well without recurrent anginal symptoms on his current medical regimen which includes aspirin, clopidogrel, isosorbide, metoprolol succinate, rosuvastatin.  Continue current therapy.  Follow-up in 6 months.  2. Chronic combined systolic and diastolic congestive heart failure (HCC) EF has improved to normal.  Echocardiogram in 2019 demonstrated EF 50-55 with mild diastolic dysfunction.  NYHA II.  Volume status is stable.  Continue losartan, metoprolol succinate, isosorbide.  Obtain follow-up c-Met at next visit.  3. Essential hypertension The patient's blood pressure is controlled on his current regimen.  Continue current therapy.   4. Mixed hyperlipidemia LDL in November 2020 was 71.  Goal is less than 70.  Continue current dose of rosuvastatin.  Plan follow-up c-Met, fasting lipids prior to next visit.  If LDL remains above 70, consider adding ezetimibe.   Dispo:  Return in about 6 months (around 11/15/2019) for Routine Follow Up, w/ Dr. Burt Knack, or Richardson Dopp, PA-C, in person.   Medication  Adjustments/Labs and Tests Ordered: Current medicines  are reviewed at length with the patient today.  Concerns regarding medicines are outlined above.  Tests Ordered: Orders Placed This Encounter  Procedures  . Comprehensive metabolic panel  . Lipid panel  . EKG 12-Lead   Medication Changes: No orders of the defined types were placed in this encounter.   Signed, Richardson Dopp, PA-C  05/16/2019 12:09 PM    [facility] [address], [place], [place]  [address] Phone: [phone]; Fax: [phone]

## 2019-05-16 ENCOUNTER — Ambulatory Visit: Payer: Medicare Other | Admitting: Physician Assistant

## 2019-05-16 ENCOUNTER — Other Ambulatory Visit: Payer: Self-pay

## 2019-05-16 ENCOUNTER — Encounter: Payer: Self-pay | Admitting: Physician Assistant

## 2019-05-16 VITALS — BP 124/78 | HR 71 | Ht 68.0 in | Wt 265.1 lb

## 2019-05-16 DIAGNOSIS — I25119 Atherosclerotic heart disease of native coronary artery with unspecified angina pectoris: Secondary | ICD-10-CM | POA: Diagnosis not present

## 2019-05-16 DIAGNOSIS — I10 Essential (primary) hypertension: Secondary | ICD-10-CM

## 2019-05-16 DIAGNOSIS — I5042 Chronic combined systolic (congestive) and diastolic (congestive) heart failure: Secondary | ICD-10-CM

## 2019-05-16 DIAGNOSIS — E782 Mixed hyperlipidemia: Secondary | ICD-10-CM | POA: Diagnosis not present

## 2019-05-16 NOTE — Patient Instructions (Signed)
Medication Instructions:   Your physician recommends that you continue on your current medications as directed. Please refer to the Current Medication list given to you today.  *If you need a refill on your cardiac medications before your next appointment, please call your pharmacy*  Lab Work:  None ordered today  Testing/Procedures:  None ordered today  Follow-Up: At [facility], you and your health needs are our priority.  As part of our continuing mission to provide you with exceptional heart care, we have created designated Provider Care Teams.  These Care Teams include your primary Cardiologist (physician) and Advanced Practice Providers (APPs -  Physician Assistants and Nurse Practitioners) who all work together to provide you with the care you need, when you need it.  We recommend signing up for the patient portal called "MyChart".  Sign up information is provided on this After Visit Summary.  MyChart is used to connect with patients for Virtual Visits (Telemedicine).  Patients are able to view lab/test results, encounter notes, upcoming appointments, etc.  Non-urgent messages can be sent to your provider as well.   To learn more about what you can do with MyChart, go to [email or web address].    Your next appointment:   6 month(s)  The format for your next appointment:   In Person  Provider:   You may see Michael Cooper, MD or Scott Weaver, PA-C  

## 2019-06-06 ENCOUNTER — Other Ambulatory Visit: Payer: Self-pay | Admitting: Physician Assistant

## 2019-08-12 ENCOUNTER — Other Ambulatory Visit: Payer: Self-pay | Admitting: Cardiovascular Disease

## 2020-01-07 ENCOUNTER — Other Ambulatory Visit: Payer: Self-pay | Admitting: Physician Assistant

## 2020-03-18 ENCOUNTER — Other Ambulatory Visit: Payer: Self-pay | Admitting: Cardiovascular Disease

## 2020-03-19 NOTE — Telephone Encounter (Signed)
Ok to fill Isosorbide. It would be best if the PCP filled the Protonix. Tereso Newcomer, PA-C    03/19/2020 4:48 PM

## 2020-03-20 NOTE — Telephone Encounter (Signed)
Ok to fill Isosorbide. It would be best if the PCP filled the Protonix. Pasco Marchitto, PA-C    03/19/2020 4:48 PM   

## 2020-03-21 ENCOUNTER — Other Ambulatory Visit: Payer: Self-pay | Admitting: Cardiovascular Disease

## 2020-04-01 ENCOUNTER — Other Ambulatory Visit: Payer: Self-pay | Admitting: Physician Assistant

## 2020-04-15 ENCOUNTER — Other Ambulatory Visit: Payer: Self-pay | Admitting: Cardiovascular Disease

## 2020-04-15 NOTE — Telephone Encounter (Signed)
Pt's pharmacy is requesting a refill on pantoprazole. Would Dr. Cooper like to refill this medication? Please address 

## 2020-04-17 NOTE — Telephone Encounter (Signed)
Called pt and pt stated that he is not taking the pantoprazole and that is taking Nexium. Could you please D/C this medication off of pt's medication list? Thanks

## 2020-05-06 ENCOUNTER — Other Ambulatory Visit: Payer: Self-pay | Admitting: Cardiovascular Disease

## 2020-05-17 ENCOUNTER — Other Ambulatory Visit: Payer: Self-pay | Admitting: Physician Assistant

## 2020-05-28 ENCOUNTER — Other Ambulatory Visit: Payer: Self-pay | Admitting: Cardiovascular Disease

## 2020-06-21 ENCOUNTER — Other Ambulatory Visit: Payer: Self-pay | Admitting: Cardiovascular Disease

## 2020-06-22 ENCOUNTER — Other Ambulatory Visit: Payer: Self-pay | Admitting: Cardiovascular Disease

## 2020-06-30 ENCOUNTER — Other Ambulatory Visit: Payer: Self-pay | Admitting: Physician Assistant

## 2020-07-01 ENCOUNTER — Other Ambulatory Visit: Payer: Self-pay | Admitting: Cardiovascular Disease

## 2020-07-01 ENCOUNTER — Telehealth: Payer: Self-pay | Admitting: Cardiovascular Disease

## 2020-07-01 NOTE — Telephone Encounter (Signed)
Per DPR form, left detailed message for patient that recent labs are viewable in Epic and he will not need to repeat prior to visit. Instructed him to call with questions or concerns.

## 2020-07-01 NOTE — Telephone Encounter (Signed)
When scheduling appointment from recall, the recall stated that the patient needed fasting labs done 1 week prior to appointment. Does pt still need those labs done? It looks like he had some lab work done on 06/16/20. Please advise.

## 2020-07-13 ENCOUNTER — Encounter: Payer: Self-pay | Admitting: Family

## 2020-07-26 ENCOUNTER — Other Ambulatory Visit: Payer: Self-pay | Admitting: Cardiovascular Disease

## 2020-07-28 NOTE — Telephone Encounter (Signed)
Outpatient Medication Detail   Disp Refills Start End   clopidogrel (PLAVIX) 75 MG tablet 30 tablet 1 07/01/2020    Sig - Route: Take 1 tablet (75 mg total) by mouth daily. Please keep upcoming appt in June 2022 before anymore refills. Thank you Final Attempt - Oral   Sent to pharmacy as: clopidogrel (PLAVIX) 75 MG tablet   Notes to Pharmacy: Pt must keep upcoming appt in June 2022 before anymore refills. Thank you Final Attempt   E-Prescribing Status: Receipt confirmed by pharmacy (07/01/2020 10:57 AM EDT)     Pharmacy  [id number] - [place], [place] - [address]

## 2020-08-08 ENCOUNTER — Ambulatory Visit: Payer: Medicare Other | Admitting: Family

## 2020-08-08 ENCOUNTER — Encounter: Payer: Self-pay | Admitting: Family

## 2020-08-08 ENCOUNTER — Other Ambulatory Visit: Payer: Self-pay

## 2020-08-08 VITALS — BP 110/60 | HR 55 | Ht 68.0 in | Wt 273.0 lb

## 2020-08-08 DIAGNOSIS — I5042 Chronic combined systolic (congestive) and diastolic (congestive) heart failure: Secondary | ICD-10-CM | POA: Diagnosis not present

## 2020-08-08 DIAGNOSIS — I10 Essential (primary) hypertension: Secondary | ICD-10-CM | POA: Diagnosis not present

## 2020-08-08 DIAGNOSIS — E785 Hyperlipidemia, unspecified: Secondary | ICD-10-CM | POA: Diagnosis not present

## 2020-08-08 DIAGNOSIS — I25118 Atherosclerotic heart disease of native coronary artery with other forms of angina pectoris: Secondary | ICD-10-CM

## 2020-08-08 DIAGNOSIS — I739 Peripheral vascular disease, unspecified: Secondary | ICD-10-CM

## 2020-08-08 DIAGNOSIS — K5900 Constipation, unspecified: Secondary | ICD-10-CM

## 2020-08-08 MED ORDER — FUROSEMIDE 40 MG PO TABS: 80.0000 mg | ORAL_TABLET | Freq: Two times a day (BID) | ORAL | 1 refills | Status: DC

## 2020-08-08 MED ORDER — ISOSORBIDE MONONITRATE ER 30 MG PO TB24: 30.0000 mg | ORAL_TABLET | Freq: Every day | ORAL | 5 refills | Status: DC

## 2020-08-08 NOTE — Patient Instructions (Signed)
Medication Instructions:  Your physician has recommended you make the following change in your medication:   Continue your current medications.   *If you need a refill on your cardiac medications before your next appointment, please call your pharmacy*   Lab Work: Your physician recommends  lab work today: CBC, CMP, ProBNP  If you have labs (blood work) drawn today and your tests are completely normal, you will receive your results only by: MyChart Message (if you have MyChart) OR A paper copy in the mail If you have any lab test that is abnormal or we need to change your treatment, we will call you to review the results.   Testing/Procedures: Your physician has requested that you have an echocardiogram. Echocardiography is a painless test that uses sound waves to create images of your heart. It provides your doctor with information about the size and shape of your heart and how well your heart's chambers and valves are working. This procedure takes approximately one hour. There are no restrictions for this procedure.  Your physician has requested that you have an ankle brachial index (ABI). During this test an ultrasound and blood pressure cuff are used to evaluate the arteries that supply the arms and legs with blood. Allow thirty minutes for this exam. There are no restrictions or special instructions.  Follow-Up: At [facility], you and your health needs are our priority.  As part of our continuing mission to provide you with exceptional heart care, we have created designated Provider Care Teams.  These Care Teams include your primary Cardiologist (physician) and Advanced Practice Providers (APPs -  Physician Assistants and Nurse Practitioners) who all work together to provide you with the care you need, when you need it.  We recommend signing up for the patient portal called "MyChart".  Sign up information is provided on this After Visit Summary.  MyChart is used to connect with  patients for Virtual Visits (Telemedicine).  Patients are able to view lab/test results, encounter notes, upcoming appointments, etc.  Non-urgent messages can be sent to your provider as well.   To learn more about what you can do with MyChart, go to [email or web address].    Your next appointment:   2 month(s)  The format for your next appointment:   In Person  Provider:   You may see Tonny Bollman, MD or one of the following Advanced Practice Providers on your designated Care Team:   Tereso Newcomer, PA-C [place], [place]   Other Instructions  Recommend drinking less than 2 liters of fluid per day. This is ALL of your fluid throughout the day - coffee, water, etc. You can try a hard candy, peppermint, or gum to stimulate your salivary glands to keep your mouth moist.   Heart Healthy Diet Recommendations: A low-salt diet is recommended. Meats should be grilled, baked, or boiled. Avoid fried foods. Focus on lean protein sources like fish or chicken with vegetables and fruits. The American Heart Association is a Chief Technology Officer!  American Heart Association Diet and Lifeystyle Recommendations   Exercise recommendations: The American Heart Association recommends 150 minutes of moderate intensity exercise weekly. Try 30 minutes of moderate intensity exercise 4-5 times per week. This could include walking, jogging, or swimming.  If you would like to participate in cardiac rehab again, please contact our office and we will place a referral.   If you continue to have difficulties with constipation, discuss with your primary care provider or gastroenterologist.

## 2020-08-08 NOTE — Progress Notes (Signed)
Office Visit    Patient Name: Danny [name] Date of Encounter: 08/08/2020  PCP:  Toni Arthurs, NP   [facility]  Cardiologist:  Sherren Mocha, MD  Advanced Practice Provider:  No care team member to display Electrophysiologist:  None    Chief Complaint    Danny [name] is a 65 y.o. male with a hx of HFrEF, ICM, DM2, HTN, HLD, carotid artery disease, CAD s/p CABG in 2016 with cath 02/2015 showing S-OM and S-Dx occluded and DES to RPDA presents today for dyspnea.   Past Medical History    Past Medical History:  Diagnosis Date   Acute MI, lateral wall, initial episode of care ([place]) 03/23/2014   CAD (coronary artery disease)    a. s/p Lat MI 2/16 >> s/p CABG; b. LHC 1/17: mLAD 100, oD1 85%, oLCx 100, OM1 filled by collats from RPLB2, RPDA stent 95% ISR, L-LAD patent, S-OM1 occluded, S-D1 occluded >> PCI: Promus DES to RPDA  - residual CAD not amenable to PCI (dist vessel/diabetic appearance)    Carotid stenosis    a. Carotid US 2/19 bilat ICA 1-39%   Chronic combined systolic and diastolic CHF (congestive heart failure) ([place])    Hyperlipidemia 10/16/2014   Ischemic cardiomyopathy    a. Echo at [facility] 3/16:  EF 35-40%, diff HK, mild LVH, mild LAE, mild TR;  b. Echo 7/16: mild LVH, EF 50%, inf and lat HK-AK, Gr 2 DD, mod LAE, mild RVE, reduced RVSF, trivial TR, PASP 30 mmHg // c. Echo 5/17: moderate LVH, EF 45-50%, anteroseptal HK, grade 1 diastolic dysfunction, aortic sclerosis    Type 2 diabetes mellitus ([place]) 03/23/2014   Past Surgical History:  Procedure Laterality Date   CARDIAC CATHETERIZATION N/A 02/24/2015   Procedure: Left Heart Cath and Cors/Grafts Angiography;  Surgeon: Sherren Mocha, MD;  Location: [place] [facility];  Service: Cardiovascular;  Laterality: N/A;   CARDIAC CATHETERIZATION N/A 02/24/2015   Procedure: Coronary Stent Intervention;  Surgeon: Sherren Mocha, MD;  Location: [place] [facility];  Service: Cardiovascular;   Laterality: N/A;   CORONARY ARTERY BYPASS GRAFT N/A 03/28/2014   Procedure: CORONARY ARTERY BYPASS GRAFTING (CABG);  Surgeon: Melrose Nakayama, MD;  Location: [place];  Service: Open Heart Surgery;  Laterality: N/A;   LEFT HEART CATH AND CORS/GRAFTS ANGIOGRAPHY N/A 05/07/2019   Procedure: LEFT HEART CATH AND CORS/GRAFTS ANGIOGRAPHY;  Surgeon: Sherren Mocha, MD;  Location: [place] [facility];  Service: Cardiovascular;  Laterality: N/A;   LEFT HEART CATHETERIZATION WITH CORONARY ANGIOGRAM N/A 03/23/2014   Procedure: LEFT HEART CATHETERIZATION WITH CORONARY ANGIOGRAM;  Surgeon: Blane Ohara, MD;  Location: [facility] CATH LAB;  Service: Cardiovascular;  Laterality: N/A;   PERCUTANEOUS CORONARY STENT INTERVENTION (PCI-S)  02/24/2015   PDA   TEE WITHOUT CARDIOVERSION N/A 03/28/2014   Procedure: TRANSESOPHAGEAL ECHOCARDIOGRAM (TEE);  Surgeon: Melrose Nakayama, MD;  Location: [place];  Service: Open Heart Surgery;  Laterality: N/A;    Allergies  Allergies  Allergen Reactions   Spironolactone Other (See Comments)    Other reaction(s): Unknown Increased creatinine Increased creatinine    History of Present Illness    Danny [name] is a 65 y.o. male with a hx of HFrEF, ICM, DM2, HTN, HLD, carotid artery disease, CAD s/p CABG in 2016 with cath 02/2015 showing S-OM and S-Dx occluded and DES to [place] last seen 05/16/19.Danny [name] his pug four times per day one block per day. Does have to stop in  the middle due to leg pain. Used to be able to walk one mile but now with claudication symptoms. Notes no chest pain, pressure, tightness. Weight up is 8 pounds compared to last year, he is frustrated by weight gain. Notes constipation despite taking fiber and stool softener, encouraged to discuss with PCP. gets a little bit of swelling in his ankle. He has been taking Metolazone every other day at direction of PCP as well as Lasix/Potassium daily. Drinking >64 oz of fluid per day. Reports following low salt  diet  EKGs/Labs/Other Studies Reviewed:   The following studies were reviewed today: Cardiac catheterization 05/07/2019 LAD mid 100; D1 ost 85 LCx ost 100 RCA irregs; PDA stent patent S-OM1 100 S-D1 100 L-LAD patent   Echo 07/07/17 Mild LVH, EF 50-55, Gr 1 DD, MAC, inf-lat HK   Echo 5/17: moderate LVH, EF 45-50%, anteroseptal HK, grade 1 diastolic dysfunction, aortic sclerosis   LHC 02/24/15 LAD mid 100%, D1 ostial 85% LCx ostial 100%, Lat OM1 filled by collats from RPLB2 RCA with RPDA stent with 95% ISR L-LAD patent S-OM1 occluded S-D1 occluded PCI:  2.5 x 12 mm Promus DES to RPDA 1. Severe native 3 vessel CAD with total occlusion of the LAD, severe diffuse stenosis of the first diagonal, total occlusion of the left circumflex, and severe stenosis of the right PDA 2. S/P aorto-coronary bypass surgery with continued patency of the LIMA - LAD and total occlusion of the SVG-OM and SVG-diagonal 3. Successful PCI of the right PDA Continue DAPT long-term. Residual CAD is not amenable to PCI secondary to diffuse distal vessel disease/diabetic pattern.   Myoview 02/13/15 This is a high risk study with a prior MI in the basal and mid inferior, inferolateral (and possibly basal anterior) walls with a large area, severe severity ischemia in the basal and mid anterolateral, anterior, apical lateral, anterior, inferior walls and in the true apex. EF 31%   Echo 08/26/14 Mild LVH, EF 50%, inferior and lateral HK-AK, Gr 2 DD, mod LAE, mild RVE, reduced RV function, trivial TR, PASP 30 mmHg   Echo at [facility] 04/16/14 Mod L Eff, EF 35-40%, Impaired relaxation, Diff HK - Ant and AS best preserved, Mild LVH, Mild LAE, Mild TR   Carotid US 03/26/14 1-39% internal carotid artery stenosis bilaterally   Echo 04/02/14 EF 50%, mild LVH, inferior and inferolateral HK, mild MR, LA upper limits of normal, normal RV function, mild TR   Cardiac cath 03/23/14 LM:  Patent. LAD:  Prox 50%, mid 75-80%.  D1  trifurcates into 3 small subbranches. Ostium has 90% stenosis. I LCx:  prox 90%, OM1 80% stenosis, OM2 80% stenosis. RCA:  PDA 80% EF:  Anterolateral wall is akinetic.  EF is 40-45%.   EKG:  EKG is ordered today.  The ekg ordered today demonstrates sinus bradycardia 55 bpm with first degree AV block. With nonspecific ST/T wave changes.   Recent Labs: No results found for requested labs within last 8760 hours.  Recent Lipid Panel    Component Value Date/Time   CHOL 123 09/02/2016 0846   TRIG 104 09/02/2016 0846   HDL 45 09/02/2016 0846   CHOLHDL 2.7 09/02/2016 0846   CHOLHDL 3.1 03/24/2014 0314   VLDL 16 03/24/2014 0314   LDLCALC 57 09/02/2016 0846   Home Medications   Current Meds  Medication Sig   amLODipine (NORVASC) 10 MG tablet Take 1 tablet by mouth daily.   aspirin EC 81 MG tablet Take 1 tablet (81 mg total)  by mouth daily.   Cholecalciferol 50 MCG (2000 UT) CAPS Take 2,000 Units by mouth daily.    clopidogrel (PLAVIX) 75 MG tablet Take 1 tablet (75 mg total) by mouth daily. Please keep upcoming appt in June 2022 before anymore refills. Thank you Final Attempt   Continuous Blood Gluc Receiver (FREESTYLE LIBRE 14 DAY READER) DEVI Use 1 kit as directed E11.65   erythromycin ophthalmic ointment Place 1 application into the left eye as needed (eye infection).    esomeprazole (NEXIUM) 40 MG capsule Take 1 capsule (40 mg total) by mouth daily at 12 noon.   furosemide (LASIX) 40 MG tablet TAKE 2 TABLETS BY MOUTH IN THE MORNING AND TAKE 1 TABLET IN THE EVENING DAILY. Please keep upcoming appt in June 2022 before anymore refills. Thank you Final Attempt   Glycerin-Hypromellose-PEG 400 ([employer] DRY EYE RELIEF) 0.2-0.2-1 % SOLN Place 1 drop into both eyes daily as needed.   HUMALOG 100 UNIT/ML injection INSULIN PUMP/ use 5  bottles a month   isosorbide mononitrate (IMDUR) 30 MG 24 hr tablet TAKE 1 TABLET BY MOUTH EVERY DAY   losartan (COZAAR) 50 MG tablet Take 1 tablet (50 mg total) by  mouth daily.   metoprolol succinate (TOPROL-XL) 100 MG 24 hr tablet TAKE 1 TABLET (100 MG TOTAL) BY MOUTH DAILY. TAKE WITH OR IMMEDIATELY FOLLOWING A MEAL.   nitroGLYCERIN (NITROSTAT) 0.4 MG SL tablet Place 1 tablet (0.4 mg total) under the tongue every 5 (five) minutes as needed for chest pain.   rosuvastatin (CRESTOR) 40 MG tablet TAKE 1 TABLET BY MOUTH EVERY DAY   triamcinolone cream (KENALOG) 0.5 % Apply 1 application topically daily as needed (nose).      Review of Systems   All other systems reviewed and are otherwise negative except as noted above.  Physical Exam    VS:  BP 110/60 (BP Location: Left Arm, Patient Position: Sitting, Cuff Size: Normal)   Pulse (!) 55   Ht _0  (1.727 m)   Wt 273 lb (123.8 kg)   SpO2 97%   BMI 41.51 kg/m  , BMI Body mass index is 41.51 kg/m.  Wt Readings from Last 3 Encounters:  08/08/20 273 lb (123.8 kg)  05/16/19 265 lb 1.9 oz (120.3 kg)  05/07/19 269 lb (122 kg)     GEN: Well nourished, overweight, well developed, in no acute distress. HEENT: normal. Neck: Supple, no JVD, carotid bruits, or masses. Cardiac:RRR, no murmurs, rubs, or gallops. No clubbing, cyanosis, edema.  Radials/PT 2+ and equal bilaterally.  Respiratory:  Respirations regular and unlabored, diminished bases bilaterally, otherwise clear on auscultation GI: Soft, nontender, nondistended. MS: No deformity or atrophy. Skin: Warm and dry, no rash. Neuro:  Strength and sensation are intact. Psych: Normal affect.  Assessment & Plan    CAD s/p CABG 2016 and DES to [place] 2017 - No chest pain. Notes dyspnea which is likely due to deconditioning, poorly controlled heart failure. No indication for ischemic eval at this time. If echo show reduced LVEF, consider LHC. GDMT includes aspirin, plavix, metoprolol, rosuvastatin.   Chronic combined systolic and diastolic heart failure - Poorly controlled. Reports worsening edema, dyspnea. Plan for echocardiogram. CBC, CMP, ProBNP today to  assess fluid volume, renal function, and rule out significant anemia. PCP recently started Metoplazone 2.53m three times per week - educated to take 30 minutes prior Torsemide as he has been taking at same time. Continue Furosemide 878min AM and 4086mn PM. Refill provided. Aditional GDMT includes  Toprol, Losartan. <2L fluid intake per day recommendation reiterated. Future considerations include transition to [facility]. I anticipate much of his dyspnea is due to deconditioning.   HTN - BP well controlled. Continue current antihypertensive regimen. Refills provided.   HLD - Continue Rosuvastatin 40 mg daily.   Constipation - Encouraged to discuss with PCP and trial OTC stool softener, laxative.   Claudication - Reports pain in legs when walking which resolved with rest. Previously able to walk 1 block, now 0.5 block. ABI and arterial duplex to rule out PAD.   Obesity - Weight loss via diet and exercise encouraged. Discussed the impact being overweight would have on cardiovascular risk. Offered cardiac rehab referral which he politely declined.   Disposition: Follow up in 2 month(s) with Dr. Burt Knack or APP   Signed, Loel Dubonnet, NP 08/08/2020, 11:54 AM [place]

## 2020-08-09 ENCOUNTER — Encounter: Payer: Self-pay | Admitting: Family

## 2020-08-09 LAB — COMPREHENSIVE METABOLIC PANEL
ALT: 44 IU/L (ref 0–44)
AST: 33 IU/L (ref 0–40)
Albumin/Globulin Ratio: 1.7 (ref 1.2–2.2)
Albumin: 4.6 g/dL (ref 3.8–4.8)
Alkaline Phosphatase: 62 IU/L (ref 44–121)
BUN/Creatinine Ratio: 21 (ref 10–24)
BUN: 41 mg/dL — ABNORMAL HIGH (ref 8–27)
Bilirubin Total: 0.5 mg/dL (ref 0.0–1.2)
CO2: 26 mmol/L (ref 20–29)
Calcium: 9.8 mg/dL (ref 8.6–10.2)
Chloride: 84 mmol/L — ABNORMAL LOW (ref 96–106)
Creatinine, Ser: 1.95 mg/dL — ABNORMAL HIGH (ref 0.76–1.27)
Globulin, Total: 2.7 g/dL (ref 1.5–4.5)
Glucose: 134 mg/dL — ABNORMAL HIGH (ref 65–99)
Potassium: 3.2 mmol/L — ABNORMAL LOW (ref 3.5–5.2)
Sodium: 131 mmol/L — ABNORMAL LOW (ref 134–144)
Total Protein: 7.3 g/dL (ref 6.0–8.5)
eGFR: 38 mL/min/{1.73_m2} — ABNORMAL LOW (ref >59)

## 2020-08-09 LAB — PRO B NATRIURETIC PEPTIDE: NT-Pro BNP: 141 pg/mL (ref 0–210)

## 2020-08-09 LAB — CBC
Hematocrit: 47.3 % (ref 37.5–51.0)
Hemoglobin: 16.1 g/dL (ref 13.0–17.7)
MCH: 29.6 pg (ref 26.6–33.0)
MCHC: 34 g/dL (ref 31.5–35.7)
MCV: 87 fL (ref 79–97)
Platelets: 266 10*3/uL (ref 150–450)
RBC: 5.44 x10E6/uL (ref 4.14–5.80)
RDW: 13.4 % (ref 11.6–15.4)
WBC: 8.4 10*3/uL (ref 3.4–10.8)

## 2020-08-11 ENCOUNTER — Telehealth: Payer: Self-pay

## 2020-08-11 DIAGNOSIS — E876 Hypokalemia: Secondary | ICD-10-CM

## 2020-08-11 NOTE — Telephone Encounter (Signed)
Discussed lab results and medication changes with pt. He had been taking OTC K+ supplements versus his prescription. He was advised to take his prescription K+ supplement and discontinue his OTC supplement. He will discontinue his metolazone and increase his K for the next 3 days per recommendation. Lab appt for 7/5 made for BMP recheck.

## 2020-08-11 NOTE — Telephone Encounter (Signed)
-----   Message from Alver Sorrow, NP sent at 08/10/2020  6:19 PM EDT ----- CBC with no evidence of anemia nor infection. Pro-BNP with no significant fluid volume overload. Kidney function shows decline, recommend STOP Metolazone. Continue Lasix 80mg  in the AM and 40mg  in the PM.   Potassium is low. Recommend Potassium twice daily for three days then return to daily.   Repeat BMP 08/15/20 or 08/19/20 for monitoring.

## 2020-08-19 ENCOUNTER — Other Ambulatory Visit: Payer: Medicare Other

## 2020-08-19 ENCOUNTER — Other Ambulatory Visit: Payer: Self-pay

## 2020-08-19 DIAGNOSIS — E876 Hypokalemia: Secondary | ICD-10-CM

## 2020-08-19 LAB — BASIC METABOLIC PANEL
BUN/Creatinine Ratio: 16 (ref 10–24)
BUN: 26 mg/dL (ref 8–27)
CO2: 28 mmol/L (ref 20–29)
Calcium: 9.3 mg/dL (ref 8.6–10.2)
Chloride: 91 mmol/L — ABNORMAL LOW (ref 96–106)
Creatinine, Ser: 1.59 mg/dL — ABNORMAL HIGH (ref 0.76–1.27)
Glucose: 180 mg/dL — ABNORMAL HIGH (ref 65–99)
Potassium: 3.4 mmol/L — ABNORMAL LOW (ref 3.5–5.2)
Sodium: 133 mmol/L — ABNORMAL LOW (ref 134–144)
eGFR: 48 mL/min/{1.73_m2} — ABNORMAL LOW (ref >59)

## 2020-08-20 ENCOUNTER — Telehealth: Payer: Self-pay

## 2020-08-20 DIAGNOSIS — I10 Essential (primary) hypertension: Secondary | ICD-10-CM

## 2020-08-20 DIAGNOSIS — E876 Hypokalemia: Secondary | ICD-10-CM

## 2020-08-20 DIAGNOSIS — Z79899 Other long term (current) drug therapy: Secondary | ICD-10-CM

## 2020-08-20 NOTE — Telephone Encounter (Signed)
Pt verbalized understanding of his lab results and also sent to his MY Chart for his review. [facility] and will have his labs then but will call if anything worsens prior to then and given his weight gain parameters.

## 2020-08-20 NOTE — Telephone Encounter (Signed)
-----   Message from Alver Sorrow, NP sent at 08/19/2020  7:14 PM EDT ----- Kidney function improving though not yet to his baseline. Potassium remains mildly low. Recommend Potassium 40mg  twice daily for three days then Potassium twice daily. Continue Fuorsemide (Lasix) 80mg  in the AM and 40 mg in the afternoon. Please instruct to weigh daily and report weight gain of 2 lbs overnight or 5 lbs in 1 week.  Repeat BMP in 2 weeks for monitoring.

## 2020-08-25 ENCOUNTER — Other Ambulatory Visit: Payer: Self-pay | Admitting: Cardiovascular Disease

## 2020-09-01 ENCOUNTER — Encounter (HOSPITAL_BASED_OUTPATIENT_CLINIC_OR_DEPARTMENT_OTHER): Payer: Self-pay

## 2020-09-01 DIAGNOSIS — E876 Hypokalemia: Secondary | ICD-10-CM

## 2020-09-08 ENCOUNTER — Ambulatory Visit (INDEPENDENT_AMBULATORY_CARE_PROVIDER_SITE_OTHER): Payer: Medicare Other

## 2020-09-08 ENCOUNTER — Other Ambulatory Visit: Payer: Self-pay

## 2020-09-08 DIAGNOSIS — I25118 Atherosclerotic heart disease of native coronary artery with other forms of angina pectoris: Secondary | ICD-10-CM

## 2020-09-08 DIAGNOSIS — I739 Peripheral vascular disease, unspecified: Secondary | ICD-10-CM | POA: Diagnosis not present

## 2020-09-08 DIAGNOSIS — I5042 Chronic combined systolic (congestive) and diastolic (congestive) heart failure: Secondary | ICD-10-CM

## 2020-09-08 LAB — ECHOCARDIOGRAM COMPLETE
Area-P 1/2: 4.49 cm2
Calc EF: 44.2 %
S' Lateral: 3 cm
Single Plane A2C EF: 32.1 %
Single Plane A4C EF: 54.9 %

## 2020-09-13 ENCOUNTER — Encounter (HOSPITAL_BASED_OUTPATIENT_CLINIC_OR_DEPARTMENT_OTHER): Payer: Self-pay

## 2020-09-15 ENCOUNTER — Encounter (HOSPITAL_BASED_OUTPATIENT_CLINIC_OR_DEPARTMENT_OTHER): Payer: Self-pay

## 2020-09-24 ENCOUNTER — Other Ambulatory Visit: Payer: Self-pay | Admitting: Physician Assistant

## 2020-11-03 ENCOUNTER — Other Ambulatory Visit: Payer: Self-pay | Admitting: Family

## 2020-11-03 DIAGNOSIS — I25118 Atherosclerotic heart disease of native coronary artery with other forms of angina pectoris: Secondary | ICD-10-CM

## 2020-11-03 DIAGNOSIS — I5042 Chronic combined systolic (congestive) and diastolic (congestive) heart failure: Secondary | ICD-10-CM

## 2020-11-03 NOTE — Telephone Encounter (Signed)
Rx(s) sent to pharmacy electronically.  

## 2020-11-19 ENCOUNTER — Encounter: Payer: Self-pay | Admitting: Physician Assistant

## 2020-11-19 ENCOUNTER — Other Ambulatory Visit: Payer: Self-pay

## 2020-11-19 ENCOUNTER — Ambulatory Visit: Payer: Medicare Other | Admitting: Physician Assistant

## 2020-11-19 VITALS — BP 102/60 | HR 68 | Ht 68.0 in | Wt 279.0 lb

## 2020-11-19 DIAGNOSIS — E876 Hypokalemia: Secondary | ICD-10-CM | POA: Diagnosis not present

## 2020-11-19 DIAGNOSIS — N183 Chronic kidney disease, stage 3 unspecified: Secondary | ICD-10-CM

## 2020-11-19 DIAGNOSIS — I25118 Atherosclerotic heart disease of native coronary artery with other forms of angina pectoris: Secondary | ICD-10-CM | POA: Diagnosis not present

## 2020-11-19 DIAGNOSIS — I5042 Chronic combined systolic (congestive) and diastolic (congestive) heart failure: Secondary | ICD-10-CM

## 2020-11-19 DIAGNOSIS — E785 Hyperlipidemia, unspecified: Secondary | ICD-10-CM

## 2020-11-19 LAB — BASIC METABOLIC PANEL
BUN/Creatinine Ratio: 10 (ref 10–24)
BUN: 13 mg/dL (ref 8–27)
CO2: 25 mmol/L (ref 20–29)
Calcium: 9.3 mg/dL (ref 8.6–10.2)
Chloride: 100 mmol/L (ref 96–106)
Creatinine, Ser: 1.25 mg/dL (ref 0.76–1.27)
Glucose: 196 mg/dL — ABNORMAL HIGH (ref 70–99)
Potassium: 4 mmol/L (ref 3.5–5.2)
Sodium: 138 mmol/L (ref 134–144)
eGFR: 64 mL/min/{1.73_m2} (ref >59)

## 2020-11-19 NOTE — Patient Instructions (Addendum)
Medication Instructions:  Your physician recommends that you continue on your current medications as directed. Please refer to the Current Medication list given to you today.  *If you need a refill on your cardiac medications before your next appointment, please call your pharmacy*   Lab Work: TODAY:  BMET   If you have labs (blood work) drawn today and your tests are completely normal, you will receive your results only by: MyChart Message (if you have MyChart) OR A paper copy in the mail If you have any lab test that is abnormal or we need to change your treatment, we will call you to review the results.   Testing/Procedures: None ordered   Follow-Up: At [facility], you and your health needs are our priority.  As part of our continuing mission to provide you with exceptional heart care, we have created designated Provider Care Teams.  These Care Teams include your primary Cardiologist (physician) and Advanced Practice Providers (APPs -  Physician Assistants and Nurse Practitioners) who all work together to provide you with the care you need, when you need it.  We recommend signing up for the patient portal called "MyChart".  Sign up information is provided on this After Visit Summary.  MyChart is used to connect with patients for Virtual Visits (Telemedicine).  Patients are able to view lab/test results, encounter notes, upcoming appointments, etc.  Non-urgent messages can be sent to your provider as well.   To learn more about what you can do with MyChart, go to [email or web address].    Your next appointment:   6 month(s)  The format for your next appointment:   In Person  Provider:   You may see Michael Cooper, MD or one of the following Advanced Practice Providers on your designated Care Team:   Scott Weaver, PA-C Vin Bhagat, PA-C   Other Instructions   

## 2020-11-19 NOTE — Progress Notes (Signed)
Cardiology Office Note:    Date:  11/19/2020   ID:  Danny [name], DOB 1956-02-10, MRN [id number]  PCP:  Toni Arthurs, NP  [facility] Cardiologist:  Sherren Mocha, MD  [facility] Electrophysiologist:  None   Chief Complaint: 4 months follow up   History of Present Illness:    Danny [name] is a 65 y.o. male with a hx of CAD s/p CABG and subsequent PCI, chronic combined CHF, ischemic cardiomyopathy with improved LVEF, hypertension, hyperlipidemia, carotid artery disease, DM and CKD III seen for follow-up.  Coronary artery disease S/p Lat MI in 2016 >> CABG Cath 02/2015: L-LAD ok, S-OM and S-Dx occluded; RPDA stent 95 ISR >> s/p DES to RPDA Diff diabetic vessel dz Combined systolic and diastolic CHF EF 37-04 % Improved >> Echocardiogram 06/2017: EF 55 Echo 08/2020: LVEF > 55%  Patient is here for follow-up.  Denies any complaints.  Walks 15 minutes every day without chest pain or shortness of breath.  Has chronic mild lower extremity edema without orthopnea or PND.  Reports watch his salt in diet.  Occasionally wears compression stocking.  Compliant with medication.   Past Medical History:  Diagnosis Date   Acute MI, lateral wall, initial episode of care ([place]) 03/23/2014   CAD (coronary artery disease)    a. s/p Lat MI 2/16 >> s/p CABG; b. LHC 1/17: mLAD 100, oD1 85%, oLCx 100, OM1 filled by collats from RPLB2, RPDA stent 95% ISR, L-LAD patent, S-OM1 occluded, S-D1 occluded >> PCI: Promus DES to RPDA  - residual CAD not amenable to PCI (dist vessel/diabetic appearance)    Carotid stenosis    a. Carotid US 2/19 bilat ICA 1-39%   Chronic combined systolic and diastolic CHF (congestive heart failure) ([place])    Hyperlipidemia 10/16/2014   Ischemic cardiomyopathy    a. Echo [facility] 3/16:  EF 35-40%, diff HK, mild LVH, mild LAE, mild TR;  b. Echo 7/16: mild LVH, EF 50%, inf and lat HK-AK, Gr 2 DD, mod LAE, mild RVE, reduced RVSF, trivial TR, PASP 30 mmHg // c. Echo  5/17: moderate LVH, EF 45-50%, anteroseptal HK, grade 1 diastolic dysfunction, aortic sclerosis    Type 2 diabetes mellitus ([place]) 03/23/2014    Past Surgical History:  Procedure Laterality Date   CARDIAC CATHETERIZATION N/A 02/24/2015   Procedure: Left Heart Cath and Cors/Grafts Angiography;  Surgeon: Sherren Mocha, MD;  Location: [place] CV [facility];  Service: Cardiovascular;  Laterality: N/A;   CARDIAC CATHETERIZATION N/A 02/24/2015   Procedure: Coronary Stent Intervention;  Surgeon: Sherren Mocha, MD;  Location: [place] [facility];  Service: Cardiovascular;  Laterality: N/A;   CORONARY ARTERY BYPASS GRAFT N/A 03/28/2014   Procedure: CORONARY ARTERY BYPASS GRAFTING (CABG);  Surgeon: Melrose Nakayama, MD;  Location: [place];  Service: Open Heart Surgery;  Laterality: N/A;   LEFT HEART CATH AND CORS/GRAFTS ANGIOGRAPHY N/A 05/07/2019   Procedure: LEFT HEART CATH AND CORS/GRAFTS ANGIOGRAPHY;  Surgeon: Sherren Mocha, MD;  Location: [place] [facility];  Service: Cardiovascular;  Laterality: N/A;   LEFT HEART CATHETERIZATION WITH CORONARY ANGIOGRAM N/A 03/23/2014   Procedure: LEFT HEART CATHETERIZATION WITH CORONARY ANGIOGRAM;  Surgeon: Blane Ohara, MD;  Location: [facility] C[facility];  Service: Cardiovascular;  Laterality: N/A;   PERCUTANEOUS CORONARY STENT INTERVENTION (PCI-S)  02/24/2015   PDA   TEE WITHOUT CARDIOVERSION N/A 03/28/2014   Procedure: TRANSESOPHAGEAL ECHOCARDIOGRAM (TEE);  Surgeon: Melrose Nakayama, MD;  Location: [place];  Service: Open Heart Surgery;  Laterality: N/A;    Current Medications: Current Meds  Medication Sig   amLODipine (NORVASC) 10 MG tablet Take 1 tablet by mouth daily.   aspirin EC 81 MG tablet Take 1 tablet (81 mg total) by mouth daily.   Cholecalciferol 50 MCG (2000 UT) CAPS Take 2,000 Units by mouth daily.    clopidogrel (PLAVIX) 75 MG tablet TAKE 1 TABLET BY MOUTH DAILY. PLEASE KEEP UPCOMING APPT IN JUNE 2022 BEFORE ANYMORE REFILLS.   Continuous Blood Gluc  Receiver (FREESTYLE LIBRE 14 DAY READER) DEVI Use 1 kit as directed E11.65   erythromycin ophthalmic ointment Place 1 application into the left eye as needed (eye infection).    esomeprazole (NEXIUM) 40 MG capsule Take 1 capsule (40 mg total) by mouth daily at 12 noon.   furosemide (LASIX) 40 MG tablet TAKE 2 TABLETS BY MOUTH IN THE MORNING AND 1 TABLET IN THE AFTERNOON.   Glycerin-Hypromellose-PEG 400 ([employer] DRY EYE RELIEF) 0.2-0.2-1 % SOLN Place 1 drop into both eyes daily as needed.   HUMALOG 100 UNIT/ML injection INSULIN PUMP/ use 5  bottles a month   ipratropium (ATROVENT) 0.03 % nasal spray Place 2 sprays into both nostrils as needed. congestion   isosorbide mononitrate (IMDUR) 30 MG 24 hr tablet Take 1 tablet (30 mg total) by mouth daily.   metolazone (ZAROXOLYN) 2.5 MG tablet daily.   metoprolol succinate (TOPROL-XL) 100 MG 24 hr tablet TAKE 1 TABLET BY MOUTH DAILY. TAKE WITH OR IMMEDIATELY FOLLOWING A MEAL.   nitroGLYCERIN (NITROSTAT) 0.4 MG SL tablet Place 1 tablet (0.4 mg total) under the tongue every 5 (five) minutes as needed for chest pain.   Olmesartan-amLODIPine-HCTZ 40-5-25 MG TABS Take 1 tablet by mouth daily.   pantoprazole (PROTONIX) 40 MG tablet Take 40 mg by mouth daily.   potassium chloride (MICRO-K) 10 MEQ CR capsule Take 20 mEq by mouth 2 (two) times daily.   rosuvastatin (CRESTOR) 40 MG tablet TAKE 1 TABLET BY MOUTH EVERY DAY   triamcinolone cream (KENALOG) 0.5 % Apply 1 application topically daily as needed (nose).      Allergies:   Spironolactone   Social History   Socioeconomic History   Marital status: Single    Spouse name: Not on file   Number of children: Not on file   Years of education: Not on file   Highest education level: Not on file  Occupational History   Not on file  Tobacco Use   Smoking status: Never   Smokeless tobacco: Never  Vaping Use   Vaping Use: Never used  Substance and Sexual Activity   Alcohol use: No    Alcohol/week: 0.0  standard drinks   Drug use: No   Sexual activity: Not on file  Other Topics Concern   Not on file  Social History Narrative   The patient is married. He has grown children. He is a lifelong nonsmoker. He works in Theatre manager.   Social Determinants of Health   Financial Resource Strain: Not on file  Food Insecurity: Not on file  Transportation Needs: Not on file  Physical Activity: Not on file  Stress: Not on file  Social Connections: Not on file     Family History: The patient's family history includes Diabetes in his brother; Healthy in his father and mother; Heart attack in his father. There is no history of Stroke.    ROS:   Please see the history of present illness.    All other systems reviewed and are negative.  EKGs/Labs/Other Studies Reviewed:    The following studies were reviewed today:  LE arterial Doppler 09/08/20 Summary:  Right: Resting right ankle-brachial index is within normal range. No  evidence of significant right lower extremity arterial disease. The right  toe-brachial index is normal.   Left: Resting left ankle-brachial index is within normal range. No  evidence of significant left lower extremity arterial disease. The left  toe-brachial index is normal.   Echo 09/08/20 1. Left ventricular ejection fraction, by estimation, is >55%. The left  ventricle has low normal function. Left ventricular endocardial border not  optimally defined to evaluate regional wall motion. The left ventricular  internal cavity size was mildly  dilated. Left ventricular diastolic parameters are consistent with Grade  II diastolic dysfunction (pseudonormalization).   2. Right ventricular systolic function is normal. The right ventricular  size is normal.   3. The mitral valve is normal in structure. No evidence of mitral valve  regurgitation. No evidence of mitral stenosis.   4. The aortic valve is tricuspid. Aortic valve regurgitation is not  visualized. No aortic  stenosis is present.   Comparison(s): Previous Echo showed LV EF 50-55%, inferior wall  hypokinesis, grade I diastolic dysfunction, MAC.   LEFT HEART CATH AND CORS/GRAFTS ANGIOGRAPHY  04/2019   Conclusion  1. Severe 2 vessel CAD with occlusion of the left circumflex and LAD 2. Patent RCA with continued patency of the PDA stent 3. S/P CABG with known occlusion of all saphenous vein grafts and continued patency of the LIMA-LAD   Recommend: continue medical therapy/lifestyle modification  EKG:  EKG is not  ordered today.   Recent Labs: 08/08/2020: ALT 44; Hemoglobin 16.1; NT-Pro BNP 141; Platelets 266 08/19/2020: BUN 26; Creatinine, Ser 1.59; Potassium 3.4; Sodium 133  Recent Lipid Panel    Component Value Date/Time   CHOL 123 09/02/2016 0846   TRIG 104 09/02/2016 0846   HDL 45 09/02/2016 0846   CHOLHDL 2.7 09/02/2016 0846   CHOLHDL 3.1 03/24/2014 0314   VLDL 16 03/24/2014 0314   LDLCALC 57 09/02/2016 0846    Physical Exam:    VS:  BP 102/60   Pulse 68   Ht _0  (1.727 m)   Wt 279 lb (126.6 kg)   SpO2 96%   BMI 42.42 kg/m     Wt Readings from Last 3 Encounters:  11/19/20 279 lb (126.6 kg)  08/08/20 273 lb (123.8 kg)  05/16/19 265 lb 1.9 oz (120.3 kg)     GEN:  Well nourished, well developed in no acute distress HEENT: Normal NECK: No JVD; No carotid bruits LYMPHATICS: No lymphadenopathy CARDIAC: RRR, no murmurs, rubs, gallops RESPIRATORY:  Clear to auscultation without rales, wheezing or rhonchi  ABDOMEN: Soft, non-tender, non-distended MUSCULOSKELETAL:  No edema; No deformity  SKIN: Warm and dry NEUROLOGIC:  Alert and oriented x 3 PSYCHIATRIC:  Normal affect   ASSESSMENT AND PLAN:    CAD No angina.  He has remained on dual antiplatelet therapy with aspirin and Plavix.  Continue beta-blocker and statin.  2.  Chronic combined CHF/ischemic cardiomyopathy -Last echo July 2022 with EF of greater than 55% and grade 2 diastolic dysfunction -Patient has mild  lower extremity edema which he states has chronic.  Leg elevation and compression stocking helps.  No orthopnea or PND. -We will continue Lasix 80 mg a.m. and 40 mg p.m. -Continue Toprol-XL 100 mg daily -Continue losartan 50 mg daily -Continue potassium supplement -His lower extremity edema could be due to side effect of  amlodipine  3.  Hyperlipidemia -Continue statin -Most recent LDL 55  4.  Hypertension -Blood pressure stable and controlled on current medication  5.  Presumed CKD stage III -Renal function was in 1.2 range in 2021.  This year it was 1.95>>1.59. Check BMP  Medication Adjustments/Labs and Tests Ordered: Current medicines are reviewed at length with the patient today.  Concerns regarding medicines are outlined above.  Orders Placed This Encounter  Procedures   Basic metabolic panel   No orders of the defined types were placed in this encounter.   Patient Instructions  Medication Instructions:  Your physician recommends that you continue on your current medications as directed. Please refer to the Current Medication list given to you today.  *If you need a refill on your cardiac medications before your next appointment, please call your pharmacy*   Lab Work: TODAY:  BMET  If you have labs (blood work) drawn today and your tests are completely normal, you will receive your results only by: [place] (if you have MyChart) OR A paper copy in the mail If you have any lab test that is abnormal or we need to change your treatment, we will call you to review the results.   Testing/Procedures: None ordered   Follow-Up: At [facility], you and your health needs are our priority.  As part of our continuing mission to provide you with exceptional heart care, we have created designated Provider Care Teams.  These Care Teams include your primary Cardiologist (physician) and Advanced Practice Providers (APPs -  Physician Assistants and Nurse Practitioners) who  all work together to provide you with the care you need, when you need it.  We recommend signing up for the patient portal called "MyChart".  Sign up information is provided on this After Visit Summary.  MyChart is used to connect with patients for Virtual Visits (Telemedicine).  Patients are able to view lab/test results, encounter notes, upcoming appointments, etc.  Non-urgent messages can be sent to your provider as well.   To learn more about what you can do with MyChart, go to [email or web address].    Your next appointment:   6 month(s)  The format for your next appointment:   In Person  Provider:   You may see Sherren Mocha, MD or one of the following Advanced Practice Providers on your designated Care Team:   Richardson Dopp, PA-C Robbie Lis, PA-C   Other Instructions    Signed, Leanor Kail, [place]  11/19/2020 10:58 AM    [place]

## 2020-11-20 NOTE — Progress Notes (Signed)
Pt has been made aware of normal result and verbalized understanding.  jw

## 2020-12-01 ENCOUNTER — Encounter (HOSPITAL_BASED_OUTPATIENT_CLINIC_OR_DEPARTMENT_OTHER): Payer: Self-pay

## 2020-12-09 ENCOUNTER — Other Ambulatory Visit
Admission: RE | Admit: 2020-12-09 | Discharge: 2020-12-09 | Disposition: A | Discharge status: Home / Self Care | Payer: Medicare Other | Source: Ambulatory Visit | Attending: Pediatrics | Admitting: Pediatrics

## 2020-12-09 DIAGNOSIS — R531 Weakness: Secondary | ICD-10-CM | POA: Diagnosis not present

## 2020-12-09 DIAGNOSIS — R600 Localized edema: Secondary | ICD-10-CM | POA: Insufficient documentation

## 2020-12-09 DIAGNOSIS — I214 Non-ST elevation (NSTEMI) myocardial infarction: Secondary | ICD-10-CM | POA: Diagnosis not present

## 2020-12-09 DIAGNOSIS — Z03818 Encounter for observation for suspected exposure to other biological agents ruled out: Secondary | ICD-10-CM | POA: Insufficient documentation

## 2020-12-09 LAB — BRAIN NATRIURETIC PEPTIDE: B Natriuretic Peptide: 82.7 pg/mL (ref 0.0–100.0)

## 2020-12-11 ENCOUNTER — Other Ambulatory Visit: Payer: Self-pay

## 2020-12-11 ENCOUNTER — Inpatient Hospital Stay
Admission: EM | Admit: 2020-12-11 | Discharge: 2020-12-16 | DRG: 280 | Disposition: A | Discharge status: Home / Self Care | Payer: Medicare Other | Attending: Internal Medicine | Admitting: Internal Medicine

## 2020-12-11 ENCOUNTER — Encounter: Payer: Self-pay | Admitting: Intensive Care

## 2020-12-11 ENCOUNTER — Emergency Department: Payer: Medicare Other

## 2020-12-11 DIAGNOSIS — I2510 Atherosclerotic heart disease of native coronary artery without angina pectoris: Secondary | ICD-10-CM | POA: Diagnosis present

## 2020-12-11 DIAGNOSIS — E876 Hypokalemia: Secondary | ICD-10-CM | POA: Diagnosis present

## 2020-12-11 DIAGNOSIS — N183 Chronic kidney disease, stage 3 unspecified: Secondary | ICD-10-CM | POA: Diagnosis present

## 2020-12-11 DIAGNOSIS — E1122 Type 2 diabetes mellitus with diabetic chronic kidney disease: Secondary | ICD-10-CM | POA: Diagnosis present

## 2020-12-11 DIAGNOSIS — I10 Essential (primary) hypertension: Secondary | ICD-10-CM | POA: Diagnosis not present

## 2020-12-11 DIAGNOSIS — Z7982 Long term (current) use of aspirin: Secondary | ICD-10-CM

## 2020-12-11 DIAGNOSIS — Z79899 Other long term (current) drug therapy: Secondary | ICD-10-CM

## 2020-12-11 DIAGNOSIS — R531 Weakness: Secondary | ICD-10-CM

## 2020-12-11 DIAGNOSIS — R059 Cough, unspecified: Secondary | ICD-10-CM | POA: Diagnosis present

## 2020-12-11 DIAGNOSIS — I130 Hypertensive heart and chronic kidney disease with heart failure and stage 1 through stage 4 chronic kidney disease, or unspecified chronic kidney disease: Secondary | ICD-10-CM | POA: Diagnosis present

## 2020-12-11 DIAGNOSIS — Z888 Allergy status to other drugs, medicaments and biological substances status: Secondary | ICD-10-CM

## 2020-12-11 DIAGNOSIS — R051 Acute cough: Secondary | ICD-10-CM | POA: Diagnosis not present

## 2020-12-11 DIAGNOSIS — E871 Hypo-osmolality and hyponatremia: Secondary | ICD-10-CM | POA: Diagnosis present

## 2020-12-11 DIAGNOSIS — I5042 Chronic combined systolic (congestive) and diastolic (congestive) heart failure: Secondary | ICD-10-CM | POA: Diagnosis present

## 2020-12-11 DIAGNOSIS — Z8249 Family history of ischemic heart disease and other diseases of the circulatory system: Secondary | ICD-10-CM

## 2020-12-11 DIAGNOSIS — I255 Ischemic cardiomyopathy: Secondary | ICD-10-CM | POA: Diagnosis present

## 2020-12-11 DIAGNOSIS — Z833 Family history of diabetes mellitus: Secondary | ICD-10-CM

## 2020-12-11 DIAGNOSIS — R0902 Hypoxemia: Secondary | ICD-10-CM | POA: Diagnosis present

## 2020-12-11 DIAGNOSIS — J121 Respiratory syncytial virus pneumonia: Secondary | ICD-10-CM | POA: Diagnosis present

## 2020-12-11 DIAGNOSIS — E785 Hyperlipidemia, unspecified: Secondary | ICD-10-CM | POA: Diagnosis present

## 2020-12-11 DIAGNOSIS — Z20822 Contact with and (suspected) exposure to covid-19: Secondary | ICD-10-CM | POA: Diagnosis present

## 2020-12-11 DIAGNOSIS — I252 Old myocardial infarction: Secondary | ICD-10-CM

## 2020-12-11 DIAGNOSIS — I214 Non-ST elevation (NSTEMI) myocardial infarction: Secondary | ICD-10-CM | POA: Diagnosis not present

## 2020-12-11 DIAGNOSIS — Z951 Presence of aortocoronary bypass graft: Secondary | ICD-10-CM

## 2020-12-11 DIAGNOSIS — Z6841 Body Mass Index (BMI) 40.0 and over, adult: Secondary | ICD-10-CM

## 2020-12-11 HISTORY — DX: Essential (primary) hypertension: I10

## 2020-12-11 LAB — CBC WITH DIFFERENTIAL/PLATELET
Abs Immature Granulocytes: 0.06 10*3/uL (ref 0.00–0.07)
Basophils Absolute: 0 10*3/uL (ref 0.0–0.1)
Basophils Relative: 0 %
Eosinophils Absolute: 0 10*3/uL (ref 0.0–0.5)
Eosinophils Relative: 0 %
HCT: 43.3 % (ref 39.0–52.0)
Hemoglobin: 16.1 g/dL (ref 13.0–17.0)
Immature Granulocytes: 1 %
Lymphocytes Relative: 12 %
Lymphs Abs: 1.4 10*3/uL (ref 0.7–4.0)
MCH: 29.9 pg (ref 26.0–34.0)
MCHC: 37.2 g/dL — ABNORMAL HIGH (ref 30.0–36.0)
MCV: 80.3 fL (ref 80.0–100.0)
Monocytes Absolute: 0.9 10*3/uL (ref 0.1–1.0)
Monocytes Relative: 8 %
Neutro Abs: 9.2 10*3/uL — ABNORMAL HIGH (ref 1.7–7.7)
Neutrophils Relative %: 79 %
Platelets: 220 10*3/uL (ref 150–400)
RBC: 5.39 MIL/uL (ref 4.22–5.81)
RDW: 12.1 % (ref 11.5–15.5)
WBC: 11.5 10*3/uL — ABNORMAL HIGH (ref 4.0–10.5)
nRBC: 0 % (ref 0.0–0.2)

## 2020-12-11 LAB — COMPREHENSIVE METABOLIC PANEL
ALT: 56 U/L — ABNORMAL HIGH (ref 0–44)
AST: 63 U/L — ABNORMAL HIGH (ref 15–41)
Albumin: 4.3 g/dL (ref 3.5–5.0)
Alkaline Phosphatase: 47 U/L (ref 38–126)
Anion gap: 15 (ref 5–15)
BUN: 19 mg/dL (ref 8–23)
CO2: 27 mmol/L (ref 22–32)
Calcium: 9 mg/dL (ref 8.9–10.3)
Chloride: 78 mmol/L — ABNORMAL LOW (ref 98–111)
Creatinine, Ser: 1.21 mg/dL (ref 0.61–1.24)
GFR, Estimated: >60 mL/min (ref >60)
Glucose, Bld: 358 mg/dL — ABNORMAL HIGH (ref 70–99)
Potassium: 3.1 mmol/L — ABNORMAL LOW (ref 3.5–5.1)
Sodium: 120 mmol/L — ABNORMAL LOW (ref 135–145)
Total Bilirubin: 1.7 mg/dL — ABNORMAL HIGH (ref 0.3–1.2)
Total Protein: 7.9 g/dL (ref 6.5–8.1)

## 2020-12-11 LAB — URINALYSIS, ROUTINE W REFLEX MICROSCOPIC
Bacteria, UA: NONE SEEN
Bilirubin Urine: NEGATIVE
Glucose, UA: >=500 mg/dL — AB
Hgb urine dipstick: NEGATIVE
Ketones, ur: 20 mg/dL — AB
Leukocytes,Ua: NEGATIVE
Nitrite: NEGATIVE
Protein, ur: NEGATIVE mg/dL
Specific Gravity, Urine: 1.016 (ref 1.005–1.030)
Squamous Epithelial / HPF: NONE SEEN (ref 0–5)
WBC, UA: NONE SEEN WBC/hpf (ref 0–5)
pH: 6 (ref 5.0–8.0)

## 2020-12-11 LAB — RESP PANEL BY RT-PCR (FLU A&B, COVID) ARPGX2
Influenza A by PCR: NEGATIVE
Influenza B by PCR: NEGATIVE
SARS Coronavirus 2 by RT PCR: NEGATIVE

## 2020-12-11 LAB — TROPONIN I (HIGH SENSITIVITY)
Troponin I (High Sensitivity): 2698 ng/L (ref <18)
Troponin I (High Sensitivity): 3512 ng/L (ref <18)

## 2020-12-11 LAB — LIPASE, BLOOD: Lipase: 22 U/L (ref 11–51)

## 2020-12-11 LAB — APTT: aPTT: 31 seconds (ref 24–36)

## 2020-12-11 LAB — PROTIME-INR
INR: 1 (ref 0.8–1.2)
Prothrombin Time: 13.3 seconds (ref 11.4–15.2)

## 2020-12-11 MED ORDER — ONDANSETRON HCL 4 MG/2ML IJ SOLN
4.0000 mg | Freq: Once | INTRAMUSCULAR | Status: AC
  Administered 2020-12-11: 4 mg via INTRAVENOUS
  Filled 2020-12-11: qty 2

## 2020-12-11 MED ORDER — SODIUM CHLORIDE 0.9 % IV BOLUS
500.0000 mL | Freq: Once | INTRAVENOUS | Status: AC
  Administered 2020-12-11: 500 mL via INTRAVENOUS

## 2020-12-11 MED ORDER — ASPIRIN 81 MG PO CHEW
324.0000 mg | CHEWABLE_TABLET | Freq: Once | ORAL | Status: AC
  Administered 2020-12-11: 324 mg via ORAL
  Filled 2020-12-11: qty 4

## 2020-12-11 MED ORDER — HEPARIN BOLUS VIA INFUSION
4000.0000 [IU] | Freq: Once | INTRAVENOUS | Status: AC
  Administered 2020-12-11: 4000 [IU] via INTRAVENOUS
  Filled 2020-12-11: qty 4000

## 2020-12-11 MED ORDER — HEPARIN (PORCINE) 25000 UT/250ML-% IV SOLN
1600.0000 [IU]/h | INTRAVENOUS | Status: DC
  Administered 2020-12-11: 1400 [IU]/h via INTRAVENOUS
  Filled 2020-12-11 (×2): qty 250

## 2020-12-11 MED ORDER — BENZONATATE 100 MG PO CAPS
100.0000 mg | ORAL_CAPSULE | Freq: Once | ORAL | Status: AC
  Administered 2020-12-11: 100 mg via ORAL
  Filled 2020-12-11: qty 1

## 2020-12-11 NOTE — ED Triage Notes (Signed)
Patient c/o dizziness, weakness and N/V. Patient diagnosed with cold a few days ago and given cough medicine and antibiotic. Reports since cold symptoms he has gotten worse.

## 2020-12-11 NOTE — Progress Notes (Signed)
ANTICOAGULATION CONSULT NOTE - Initial Consult  Pharmacy Consult for Heparin  Indication: chest pain/ACS  Allergies  Allergen Reactions   Spironolactone Other (See Comments)    Other reaction(s): Unknown Increased creatinine Increased creatinine    Patient Measurements: Height: 5\' 8"  (172.7 cm) Weight: 127 kg (280 lb) IBW/kg (Calculated) : 68.4 Heparin Dosing Weight: 98 kg   Vital Signs: Temp: 98.5 F (36.9 C) (10/27 1731) Temp Source: Oral (10/27 1731) BP: 153/75 (10/27 1731) Pulse Rate: 87 (10/27 1731)  Labs: Recent Labs    12/11/20 2026  HGB 16.1  HCT 43.3  PLT 220  CREATININE 1.21  TROPONINIHS 2,698*    Estimated Creatinine Clearance: 80.1 mL/min (by C-G formula based on SCr of 1.21 mg/dL).   Medical History: Past Medical History:  Diagnosis Date   Acute MI, lateral wall, initial episode of care ([facility]) 03/23/2014   CAD (coronary artery disease)    a. s/p Lat MI 2/16 >> s/p CABG; b. LHC 1/17: mLAD 100, oD1 85%, oLCx 100, OM1 filled by collats from RPLB2, RPDA stent 95% ISR, L-LAD patent, S-OM1 occluded, S-D1 occluded >> PCI: Promus DES to RPDA  - residual CAD not amenable to PCI (dist vessel/diabetic appearance)    Carotid stenosis    a. Carotid 2/17 2/19 bilat ICA 1-39%   Chronic combined systolic and diastolic CHF (congestive heart failure) ([facility])    Hyperlipidemia 10/16/2014   Hypertension    Ischemic cardiomyopathy    a. Echo at [facility] 3/16:  EF 35-40%, diff HK, mild LVH, mild LAE, mild TR;  b. Echo 7/16: mild LVH, EF 50%, inf and lat HK-AK, Gr 2 DD, mod LAE, mild RVE, reduced RVSF, trivial TR, PASP 30 mmHg // c. Echo 5/17: moderate LVH, EF 45-50%, anteroseptal HK, grade 1 diastolic dysfunction, aortic sclerosis    Type 2 diabetes mellitus ([facility]) 03/23/2014    Medications:  (Not in a hospital admission)   Assessment: Pharmacy consulted to dose heparin in this 65 year old male admitted with ACS/NSTEMI.  No prior anticoag noted.  CrCl = 80.1 ml/min   Goal  of Therapy:  Heparin level 0.3-0.7 units/ml Monitor platelets by anticoagulation protocol: Yes   Plan:  Give 4000 units bolus x 1 Start heparin infusion at 1400 units/hr Check anti-Xa level in 6 hours and daily while on heparin Continue to monitor H&H and platelets  Danny [name] D 12/11/2020,10:29 PM

## 2020-12-11 NOTE — ED Notes (Signed)
Critical result, troponin 2,698, called by Lab to this RN and result given to Dr. Fuller Plan.

## 2020-12-11 NOTE — ED Provider Notes (Signed)
[facility] Emergency Department Provider Note  ____________________________________________   Event Date/Time   First MD Initiated Contact with Patient 12/11/20 1939     (approximate)  I have reviewed the triage vital signs and the nursing notes.   HISTORY  Chief Complaint Weakness    HPI Danny [name] is a 65 y.o. male with history of coronary disease, hypertension, hyperlipidemia, diabetes who comes in with concerns for dizziness, weakness, nausea, vomiting.  Patient was reportedly prescribed some cough medicine antibiotics for a cold a few days ago.  Patient reports being started on doxycycline for presumed sinus infection.  Patient denies ever being on this previously.  He states that he has had some episodes of vomiting, nonbloody nonbilious.  As well as significant coughing episodes.  These have been intermittent, nothing makes it better or worse.  States that now he started to feel dizzy and weak secondary to it.  Denies any abdominal pain, urinary symptoms.         Past Medical History:  Diagnosis Date   Acute MI, lateral wall, initial episode of care ([place]) 03/23/2014   CAD (coronary artery disease)    a. s/p Lat MI 2/16 >> s/p CABG; b. LHC 1/17: mLAD 100, oD1 85%, oLCx 100, OM1 filled by collats from RPLB2, RPDA stent 95% ISR, L-LAD patent, S-OM1 occluded, S-D1 occluded >> PCI: Promus DES to RPDA  - residual CAD not amenable to PCI (dist vessel/diabetic appearance)    Carotid stenosis    a. Carotid US 2/19 bilat ICA 1-39%   Chronic combined systolic and diastolic CHF (congestive heart failure) ([place])    Hyperlipidemia 10/16/2014   Hypertension    Ischemic cardiomyopathy    a. Echo at [facility] 3/16:  EF 35-40%, diff HK, mild LVH, mild LAE, mild TR;  b. Echo 7/16: mild LVH, EF 50%, inf and lat HK-AK, Gr 2 DD, mod LAE, mild RVE, reduced RVSF, trivial TR, PASP 30 mmHg // c. Echo 5/17: moderate LVH, EF 45-50%, anteroseptal HK, grade 1 diastolic  dysfunction, aortic sclerosis    Type 2 diabetes mellitus ([place]) 03/23/2014    Patient Active Problem List   Diagnosis Date Noted   Angina pectoris ([place])    Myalgia 10/17/2014   Chronic combined systolic and diastolic congestive heart failure ([place]) 10/16/2014   Ischemic cardiomyopathy 10/16/2014   Essential hypertension 10/16/2014   Hyperlipidemia 10/16/2014   Coronary artery disease involving native coronary artery of native heart without angina pectoris 10/16/2014   S/P CABG x 3 03/28/2014   Type 2 diabetes mellitus ([place]) 03/23/2014   History of acute lateral wall MI 03/23/2014    Past Surgical History:  Procedure Laterality Date   CARDIAC CATHETERIZATION N/A 02/24/2015   Procedure: Left Heart Cath and Cors/Grafts Angiography;  Surgeon: Sherren Mocha, MD;  Location: [place] CV LAB;  Service: Cardiovascular;  Laterality: N/A;   CARDIAC CATHETERIZATION N/A 02/24/2015   Procedure: Coronary Stent Intervention;  Surgeon: Sherren Mocha, MD;  Location: [place] CV LAB;  Service: Cardiovascular;  Laterality: N/A;   CORONARY ARTERY BYPASS GRAFT N/A 03/28/2014   Procedure: CORONARY ARTERY BYPASS GRAFTING (CABG);  Surgeon: Melrose Nakayama, MD;  Location: [place];  Service: Open Heart Surgery;  Laterality: N/A;   LEFT HEART CATH AND CORS/GRAFTS ANGIOGRAPHY N/A 05/07/2019   Procedure: LEFT HEART CATH AND CORS/GRAFTS ANGIOGRAPHY;  Surgeon: Sherren Mocha, MD;  Location: [place] CV LAB;  Service: Cardiovascular;  Laterality: N/A;   LEFT HEART CATHETERIZATION WITH CORONARY ANGIOGRAM N/A  03/23/2014   Procedure: LEFT HEART CATHETERIZATION WITH CORONARY ANGIOGRAM;  Surgeon: Blane Ohara, MD;  Location: [facility] CATH LAB;  Service: Cardiovascular;  Laterality: N/A;   PERCUTANEOUS CORONARY STENT INTERVENTION (PCI-S)  02/24/2015   PDA   TEE WITHOUT CARDIOVERSION N/A 03/28/2014   Procedure: TRANSESOPHAGEAL ECHOCARDIOGRAM (TEE);  Surgeon: Melrose Nakayama, MD;  Location: [place];  Service: Open  Heart Surgery;  Laterality: N/A;    Prior to Admission medications   Medication Sig Start Date End Date Taking? Authorizing Provider  amLODipine (NORVASC) 10 MG tablet Take 1 tablet by mouth daily. 04/26/20   [provider]  aspirin EC 81 MG tablet Take 1 tablet (81 mg total) by mouth daily. 04/30/19   Sherren Mocha, MD  Cholecalciferol 50 MCG (2000 UT) CAPS Take 2,000 Units by mouth daily.     [provider]  clopidogrel (PLAVIX) 75 MG tablet TAKE 1 TABLET BY MOUTH DAILY. PLEASE KEEP UPCOMING APPT IN JUNE 2022 BEFORE ANYMORE REFILLS. 08/25/20   Sherren Mocha, MD  Continuous Blood Gluc Receiver (FREESTYLE LIBRE 14 DAY READER) DEVI Use 1 kit as directed E11.65 03/29/17   [provider]  erythromycin ophthalmic ointment Place 1 application into the left eye as needed (eye infection).  05/04/17   [provider]  esomeprazole (NEXIUM) 40 MG capsule Take 1 capsule (40 mg total) by mouth daily at 12 noon. 05/02/19   Sherren Mocha, MD  furosemide (LASIX) 40 MG tablet TAKE 2 TABLETS BY MOUTH IN THE MORNING AND 1 TABLET IN THE AFTERNOON. 11/03/20   Loel Dubonnet, NP  Glycerin-Hypromellose-PEG 400 ([employer] DRY EYE RELIEF) 0.2-0.2-1 % SOLN Place 1 drop into both eyes daily as needed.    [provider]  HUMALOG 100 UNIT/ML injection INSULIN PUMP/ use 5  bottles a month 04/20/19   [provider]  ipratropium (ATROVENT) 0.03 % nasal spray Place 2 sprays into both nostrils as needed. congestion 10/19/20   [provider]  isosorbide mononitrate (IMDUR) 30 MG 24 hr tablet Take 1 tablet (30 mg total) by mouth daily. 08/08/20   Loel Dubonnet, NP  losartan (COZAAR) 50 MG tablet Take 1 tablet (50 mg total) by mouth daily. 04/25/19 08/08/20  Richardson Dopp T, PA-C  metolazone (ZAROXOLYN) 2.5 MG tablet daily. 09/05/20   [provider]  metoprolol succinate (TOPROL-XL) 100 MG 24 hr tablet TAKE 1 TABLET BY MOUTH DAILY. TAKE WITH OR IMMEDIATELY  FOLLOWING A MEAL. 09/25/20   Sherren Mocha, MD  nitroGLYCERIN (NITROSTAT) 0.4 MG SL tablet Place 1 tablet (0.4 mg total) under the tongue every 5 (five) minutes as needed for chest pain. 04/27/19   Sherren Mocha, MD  Olmesartan-amLODIPine-HCTZ 40-5-25 MG TABS Take 1 tablet by mouth daily. 08/31/20   [provider]  pantoprazole (PROTONIX) 40 MG tablet Take 40 mg by mouth daily. 10/18/20   [provider]  potassium chloride (MICRO-K) 10 MEQ CR capsule Take 20 mEq by mouth 2 (two) times daily.    [provider]  rosuvastatin (CRESTOR) 40 MG tablet TAKE 1 TABLET BY MOUTH EVERY DAY 05/19/20   Richardson Dopp T, PA-C  triamcinolone cream (KENALOG) 0.5 % Apply 1 application topically daily as needed (nose).  08/14/14   [provider]    Allergies Spironolactone  Family History  Problem Relation Age of Onset   Healthy Mother        No history of CAD   Healthy Father        No history of  CAD   Heart attack Father    Diabetes Brother    Stroke Neg Hx     Social History Social History   Tobacco Use   Smoking status: Never   Smokeless tobacco: Never  Vaping Use   Vaping Use: Never used  Substance Use Topics   Alcohol use: Yes   Drug use: No      Review of Systems Constitutional: No fever/chills, positive dizzy Eyes: No visual changes. ENT: No sore throat. Cardiovascular: Denies chest pain. Respiratory: Denies shortness of breath.  Positive cough Gastrointestinal: No abdominal pain.  Positive nausea, vomiting no constipation. Genitourinary: Negative for dysuria. Musculoskeletal: Negative for back pain. Skin: Negative for rash. Neurological: Negative for headaches, focal weakness or numbness. All other ROS negative ____________________________________________   PHYSICAL EXAM:  VITAL SIGNS: ED Triage Vitals  Enc Vitals Group     BP 12/11/20 1731 (!) 153/75     Pulse Rate 12/11/20 1731 87     Resp 12/11/20 1731 20     Temp 12/11/20 1731  98.5 F (36.9 C)     Temp Source 12/11/20 1731 Oral     SpO2 12/11/20 1731 94 %     Weight 12/11/20 1732 280 lb (127 kg)     Height 12/11/20 1732 $RemoveBefor'5\' 8"'nxZoccGVJTez$  (1.727 m)     Head Circumference --      Peak Flow --      Pain Score 12/11/20 1731 0     Pain Loc --      Pain Edu? --      Excl. in [place]? --     Constitutional: Alert and oriented. Well appearing and in no acute distress. Eyes: Conjunctivae are normal. EOMI. Head: Atraumatic. Nose: No congestion/rhinnorhea. Mouth/Throat: Mucous membranes are moist.   Neck: No stridor. Trachea Midline. FROM Cardiovascular: Normal rate, regular rhythm. Grossly normal heart sounds.  Good peripheral circulation. Respiratory: Normal respiratory effort.  No retractions. Lungs CTAB. Gastrointestinal: Soft and nontender. No distention. No abdominal bruits.  Musculoskeletal: No lower extremity tenderness nor edema.  No joint effusions. Neurologic:  Normal speech and language. No gross focal neurologic deficits are appreciated.  Skin:  Skin is warm, dry and intact. No rash noted. Psychiatric: Mood and affect are normal. Speech and behavior are normal. GU: Deferred   ____________________________________________   LABS (all labs ordered are listed, but only abnormal results are displayed)  Labs Reviewed  CBC WITH DIFFERENTIAL/PLATELET - Abnormal; Notable for the following components:      Result Value   WBC 11.5 (*)    MCHC 37.2 (*)    Neutro Abs 9.2 (*)    All other components within normal limits  COMPREHENSIVE METABOLIC PANEL - Abnormal; Notable for the following components:   Sodium 120 (*)    Potassium 3.1 (*)    Chloride 78 (*)    Glucose, Bld 358 (*)    AST 63 (*)    ALT 56 (*)    Total Bilirubin 1.7 (*)    All other components within normal limits  URINALYSIS, ROUTINE W REFLEX MICROSCOPIC - Abnormal; Notable for the following components:   Color, Urine YELLOW (*)    APPearance CLEAR (*)    Glucose, UA >=500 (*)    Ketones, ur 20 (*)     All other components within normal limits  TROPONIN I (HIGH SENSITIVITY) - Abnormal; Notable for the following components:   Troponin I (High Sensitivity) 2,698 (*)    All other components within normal limits  TROPONIN I (  HIGH SENSITIVITY) - Abnormal; Notable for the following components:   Troponin I (High Sensitivity) 3,512 (*)    All other components within normal limits  RESP PANEL BY RT-PCR (FLU A&B, COVID) ARPGX2  LIPASE, BLOOD  APTT  PROTIME-INR  CBC  HEPARIN LEVEL (UNFRACTIONATED)   ____________________________________________   ED ECG REPORT I, Vanessa [place], the attending physician, personally viewed and interpreted this ECG.  EKG normal sinus, no ST elevation, no T wave inversions, normal intervals ____________________________________________  RADIOLOGY Robert Bellow, personally viewed and evaluated these images (plain radiographs) as part of my medical decision making, as well as reviewing the written report by the radiologist.  ED MD interpretation: No pneumonia  Official radiology report(s): DG Chest 2 View  Result Date: 12/11/2020 CLINICAL DATA:  Cough EXAM: CHEST - 2 VIEW COMPARISON:  05/13/2014 FINDINGS: Prior CABG. Heart and mediastinal contours are within normal limits. No focal opacities or effusions. No acute bony abnormality. IMPRESSION: No active cardiopulmonary disease. Electronically Signed   By: Rolm Baptise M.D.   On: 12/11/2020 18:32    ____________________________________________   PROCEDURES  Procedure(s) performed (including Critical Care):  .Critical Care Performed by: Vanessa [place], MD Authorized by: Vanessa [place], MD   Critical care provider statement:    Critical care time (minutes):  35   Critical care was necessary to treat or prevent imminent or life-threatening deterioration of the following conditions:  Cardiac failure   Critical care was time spent personally by me on the following activities:  Blood draw for specimens,  development of treatment plan with patient or surrogate, evaluation of patient's response to treatment, examination of patient, ordering and performing treatments and interventions, ordering and review of laboratory studies, ordering and review of radiographic studies, pulse oximetry and re-evaluation of patient's condition   I assumed direction of critical care for this patient from another provider in my specialty: no     ____________________________________________   INITIAL IMPRESSION / [place] / ED COURSE  Danny [name] was evaluated in Emergency Department on 12/11/2020 for the symptoms described in the history of present illness. He was evaluated in the context of the global COVID-19 pandemic, which necessitated consideration that the patient might be at risk for infection with the SARS-CoV-2 virus that causes COVID-19. Institutional protocols and algorithms that pertain to the evaluation of patients at risk for COVID-19 are in a state of rapid change based on information released by regulatory bodies including the CDC and federal and state organizations. These policies and algorithms were followed during the patient's care in the ED.    Patient comes in with symptoms of dizziness nausea, vomiting after being started on doxycycline.  Could be to be a medication reaction but given all his comorbidities we will get labs to make sure no evidence of DKA, ACS, EKG to evaluate for arrhythmia.  Chest x-ray ordered from triage without any evidence of forming pneumonia patient is afebrile and otherwise well-appearing.  We will try to treat his symptoms with some fluid and some Zofran and reevaluate patient after labs.  Labs show no significant dehydration with low sodium, low chloride.  Patient does have a little bit of swelling in his legs but states that that is much better than normal.  I suspect that he is dehydrated given the sickness we will give a little bit of fluids to see if  this helps.  Does not seem to be fluid overloaded.  His troponin came back significantly elevated he  denies any chest pain or shortness of breath could just be demand given how high it is we will give aspirin start on heparin.  No current chest pain and EKG without ischemia.  Will discuss with the hospital team for admission         ____________________________________________   FINAL CLINICAL IMPRESSION(S) / ED DIAGNOSES   Final diagnoses:  NSTEMI (non-ST elevated myocardial infarction) ([place])  Weakness  Cough, unspecified type      MEDICATIONS GIVEN DURING THIS VISIT:  Medications  heparin ADULT infusion 100 units/mL (25000 units/210m) (1,400 Units/hr Intravenous New Bag/Given 12/11/20 2247)  sodium chloride 0.9 % bolus 500 mL (500 mLs Intravenous New Bag/Given 12/11/20 2057)  ondansetron (ZOFRAN) injection 4 mg (4 mg Intravenous Given 12/11/20 2054)  aspirin chewable tablet 324 mg (324 mg Oral Given 12/11/20 2242)  heparin bolus via infusion 4,000 Units (4,000 Units Intravenous Bolus from Bag 12/11/20 2248)  benzonatate (TESSALON) capsule 100 mg (100 mg Oral Given 12/11/20 2258)     ED Discharge Orders     None        Note:  This document was prepared using Dragon voice recognition software and may include unintentional dictation errors.    FVanessa Culver MD 12/11/20 [phone]

## 2020-12-11 NOTE — ED Provider Notes (Addendum)
Emergency Medicine Provider Triage Evaluation Note  Danny [name], a 65 y.o. male  was evaluated in triage.  Pt complains of continued dizziness, weakness, with cough as well as associated nausea and vomiting.  Patient was diagnosed with a "few days ago started on doxycycline.  He notes worsening of his symptoms since that time.  Review of Systems  Positive: General malaise, nausea/vomiting Negative: FCS  Physical Exam  BP (!) 153/75 (BP Location: Left Arm)   Pulse 87   Temp 98.5 F (36.9 C) (Oral)   Resp 20   Ht 5\' 8"  (1.727 m)   Wt 127 kg   SpO2 94%   BMI 42.57 kg/m  Gen:   Awake, no distress NAD Resp:  Normal effort rhonchi noted bilaterally MSK:   Moves extremities without difficulty  Other:  CVS: RRR  Medical Decision Making  Medically screening exam initiated at 5:44 PM.  Appropriate orders placed.  was informed that the remainder of the evaluation will be completed by another provider, this initial triage assessment does not replace that evaluation, and the importance of remaining in the ED until their evaluation is complete.  Patient ED evaluation of ongoing symptoms including cough, congestion, generalized malaise and started on a doxycycline course for presumed sinus infection.     [name], PA-C 12/11/20 1758    12/13/20, PA-C 12/11/20 1758    12/13/20, MD 12/11/20 [phone]

## 2020-12-11 NOTE — H&P (Signed)
History and Physical    Danny [name] KLK:[id number] DOB: 1955-07-26 DOA: 12/11/2020  PCP: Toni Arthurs, NP    Patient coming from:  Home   Chief Complaint:  Cough   HPI: Danny [name] is a 65 y.o. male seen in ed with complaints of cough for past few weeks.  Patient complains of nausea vomiting cough no shortness of breath no chest pain no palpitations no headaches blurred vision speech or gait issues, abdomen is distended nontender bowel sounds are within normal limits on exam.  Patient denies any other complaints.   Pt has past medical history of heart disease, hypertension, chronic combined systolic diastolic congestive heart failure, diabetes mellitus type 2.  ED Course:  Vitals:   12/11/20 1731 12/11/20 1732 12/11/20 2303 12/11/20 2330  BP: (!) 153/75  (!) 153/70 (!) 161/73  Pulse: 87  84 92  Resp: 20  20 20   Temp: 98.5 F (36.9 C)     TempSrc: Oral     SpO2: 94%  94% 94%  Weight:  127 kg    Height:  5' 8"  (1.727 m)    Emergency room patient is alert awake afebrile oriented and blood pressure elevated as above. Respiratory panel negative for flu and COVID, Lab work today shows hyponatremia with a sodium of 120, potassium 3.1, glucose 358, creatinine of 1.21, AST of 63 ALT of 56, total bili of 1.7.  Initial BNP of 82.7, initial troponin of 2698.  CBC shows white count of 11.5 hemoglobin of 16.1 platelet count of 220. Patient received aspirin 324, half a liter normal saline bolus, Zofran, heparin GTT per ACS protocol. Last echocardiogram from July 2022 shows left ventricular ejection fraction of 55%, left ventricular internal cavity size is mildly dilated, diastolic parameters are consistent with grade 2 diastolic dysfunction, right ventricular systolic function is normal, mitral valve is normal, aortic and tricuspid valve not visualized.  Review of Systems:  Review of Systems  Constitutional:  Positive for malaise/fatigue.  HENT: Negative.    Eyes:  Negative.   Respiratory: Negative.    Cardiovascular: Negative.   Skin: Negative.   All other systems reviewed and are negative.   Past Medical History:  Diagnosis Date   Acute MI, lateral wall, initial episode of care ([place]) 03/23/2014   CAD (coronary artery disease)    a. s/p Lat MI 2/16 >> s/p CABG; b. LHC 1/17: mLAD 100, oD1 85%, oLCx 100, OM1 filled by collats from RPLB2, RPDA stent 95% ISR, L-LAD patent, S-OM1 occluded, S-D1 occluded >> PCI: Promus DES to RPDA  - residual CAD not amenable to PCI (dist vessel/diabetic appearance)    Carotid stenosis    a. Carotid US 2/19 bilat ICA 1-39%   Chronic combined systolic and diastolic CHF (congestive heart failure) ([place])    Hyperlipidemia 10/16/2014   Hypertension    Ischemic cardiomyopathy    a. Echo at [name] 3/16:  EF 35-40%, diff HK, mild LVH, mild LAE, mild TR;  b. Echo 7/16: mild LVH, EF 50%, inf and lat HK-AK, Gr 2 DD, mod LAE, mild RVE, reduced RVSF, trivial TR, PASP 30 mmHg // c. Echo 5/17: moderate LVH, EF 45-50%, anteroseptal HK, grade 1 diastolic dysfunction, aortic sclerosis    Type 2 diabetes mellitus ([place]) 03/23/2014    Past Surgical History:  Procedure Laterality Date   CARDIAC CATHETERIZATION N/A 02/24/2015   Procedure: Left Heart Cath and Cors/Grafts Angiography;  Surgeon: Sherren Mocha, MD;  Location: [place] [facility];  Service: Cardiovascular;  Laterality:  N/A;   CARDIAC CATHETERIZATION N/A 02/24/2015   Procedure: Coronary Stent Intervention;  Surgeon: Sherren Mocha, MD;  Location: [place] CV [facility];  Service: Cardiovascular;  Laterality: N/A;   CORONARY ARTERY BYPASS GRAFT N/A 03/28/2014   Procedure: CORONARY ARTERY BYPASS GRAFTING (CABG);  Surgeon: Melrose Nakayama, MD;  Location: [place];  Service: Open Heart Surgery;  Laterality: N/A;   LEFT HEART CATH AND CORS/GRAFTS ANGIOGRAPHY N/A 05/07/2019   Procedure: LEFT HEART CATH AND CORS/GRAFTS ANGIOGRAPHY;  Surgeon: Sherren Mocha, MD;  Location: [place] [facility];   Service: Cardiovascular;  Laterality: N/A;   LEFT HEART CATHETERIZATION WITH CORONARY ANGIOGRAM N/A 03/23/2014   Procedure: LEFT HEART CATHETERIZATION WITH CORONARY ANGIOGRAM;  Surgeon: Blane Ohara, MD;  Location: [facility] C[facility];  Service: Cardiovascular;  Laterality: N/A;   PERCUTANEOUS CORONARY STENT INTERVENTION (PCI-S)  02/24/2015   PDA   TEE WITHOUT CARDIOVERSION N/A 03/28/2014   Procedure: TRANSESOPHAGEAL ECHOCARDIOGRAM (TEE);  Surgeon: Melrose Nakayama, MD;  Location: [place];  Service: Open Heart Surgery;  Laterality: N/A;     reports that he has never smoked. He has never used smokeless tobacco. He reports current alcohol use. He reports that he does not use drugs.  Allergies  Allergen Reactions   Spironolactone Other (See Comments)    Other reaction(s): Unknown Increased creatinine Increased creatinine    Family History  Problem Relation Age of Onset   Healthy Mother        No history of CAD   Healthy Father        No history of CAD   Heart attack Father    Diabetes Brother    Stroke Neg Hx     Prior to Admission medications   Medication Sig Start Date End Date Taking? Authorizing Provider  amLODipine (NORVASC) 10 MG tablet Take 1 tablet by mouth daily. 04/26/20   [provider]  aspirin EC 81 MG tablet Take 1 tablet (81 mg total) by mouth daily. 04/30/19   Sherren Mocha, MD  Cholecalciferol 50 MCG (2000 UT) CAPS Take 2,000 Units by mouth daily.     [provider]  clopidogrel (PLAVIX) 75 MG tablet TAKE 1 TABLET BY MOUTH DAILY. PLEASE KEEP UPCOMING APPT IN JUNE 2022 BEFORE ANYMORE REFILLS. 08/25/20   Sherren Mocha, MD  Continuous Blood Gluc Receiver (FREESTYLE LIBRE 14 DAY READER) DEVI Use 1 kit as directed E11.65 03/29/17   [provider]  erythromycin ophthalmic ointment Place 1 application into the left eye as needed (eye infection).  05/04/17   [provider]  esomeprazole (NEXIUM) 40 MG capsule Take 1 capsule (40 mg total)  by mouth daily at 12 noon. 05/02/19   Sherren Mocha, MD  furosemide (LASIX) 40 MG tablet TAKE 2 TABLETS BY MOUTH IN THE MORNING AND 1 TABLET IN THE AFTERNOON. 11/03/20   Loel Dubonnet, NP  Glycerin-Hypromellose-PEG 400 ([employer] DRY EYE RELIEF) 0.2-0.2-1 % SOLN Place 1 drop into both eyes daily as needed.    [provider]  HUMALOG 100 UNIT/ML injection INSULIN PUMP/ use 5  bottles a month 04/20/19   [provider]  ipratropium (ATROVENT) 0.03 % nasal spray Place 2 sprays into both nostrils as needed. congestion 10/19/20   [provider]  isosorbide mononitrate (IMDUR) 30 MG 24 hr tablet Take 1 tablet (30 mg total) by mouth daily. 08/08/20   Loel Dubonnet, NP  losartan (COZAAR) 50 MG tablet Take 1 tablet (50 mg total) by mouth daily. 04/25/19 08/08/20  Kathlen Mody,  Scott T, PA-C  metolazone (ZAROXOLYN) 2.5 MG tablet daily. 09/05/20   [provider]  metoprolol succinate (TOPROL-XL) 100 MG 24 hr tablet TAKE 1 TABLET BY MOUTH DAILY. TAKE WITH OR IMMEDIATELY FOLLOWING A MEAL. 09/25/20   Sherren Mocha, MD  nitroGLYCERIN (NITROSTAT) 0.4 MG SL tablet Place 1 tablet (0.4 mg total) under the tongue every 5 (five) minutes as needed for chest pain. 04/27/19   Sherren Mocha, MD  Olmesartan-amLODIPine-HCTZ 40-5-25 MG TABS Take 1 tablet by mouth daily. 08/31/20   [provider]  pantoprazole (PROTONIX) 40 MG tablet Take 40 mg by mouth daily. 10/18/20   [provider]  potassium chloride (MICRO-K) 10 MEQ CR capsule Take 20 mEq by mouth 2 (two) times daily.    [provider]  rosuvastatin (CRESTOR) 40 MG tablet TAKE 1 TABLET BY MOUTH EVERY DAY 05/19/20   Richardson Dopp T, PA-C  triamcinolone cream (KENALOG) 0.5 % Apply 1 application topically daily as needed (nose).  08/14/14   [provider]    Physical Exam: Vitals:   12/11/20 1731 12/11/20 1732 12/11/20 2303 12/11/20 2330  BP: (!) 153/75  (!) 153/70 (!) 161/73  Pulse: 87  84 92  Resp: 20  20  20   Temp: 98.5 F (36.9 C)     TempSrc: Oral     SpO2: 94%  94% 94%  Weight:  127 kg    Height:  5' 8"  (1.727 m)     Physical Exam Vitals and nursing note reviewed.  Constitutional:      Appearance: Normal appearance.  HENT:     Head: Normocephalic and atraumatic.     Right Ear: External ear normal.     Left Ear: External ear normal.     Nose: Nose normal.     Mouth/Throat:     Mouth: Mucous membranes are moist.  Eyes:     Extraocular Movements: Extraocular movements intact.     Pupils: Pupils are equal, round, and reactive to light.  Neck:     Vascular: No carotid bruit.  Cardiovascular:     Rate and Rhythm: Normal rate and regular rhythm.     Pulses: Normal pulses.     Heart sounds: Normal heart sounds.  Pulmonary:     Effort: Pulmonary effort is normal.     Breath sounds: Normal breath sounds.  Abdominal:     General: Bowel sounds are normal. There is distension.     Palpations: Abdomen is soft.     Comments: obese  Musculoskeletal:     Right lower leg: No edema.     Left lower leg: No edema.  Neurological:     General: No focal deficit present.     Mental Status: He is alert and oriented to person, place, and time.  Psychiatric:        Mood and Affect: Mood normal.        Behavior: Behavior normal.     Labs on Admission: I have personally reviewed following labs and imaging studies  No results for input(s): CKTOTAL, CKMB, TROPONINI in the last 72 hours. Lab Results  Component Value Date   WBC 11.5 (H) 12/11/2020   HGB 16.1 12/11/2020   HCT 43.3 12/11/2020   MCV 80.3 12/11/2020   PLT 220 12/11/2020    Recent Labs  Lab 12/11/20 2026  NA 120*  K 3.1*  CL 78*  CO2 27  BUN 19  CREATININE 1.21  CALCIUM 9.0  PROT 7.9  BILITOT 1.7*  ALKPHOS 47  ALT 56*  AST 63*  GLUCOSE 358*   Lab Results  Component Value Date   CHOL 123 09/02/2016   HDL 45 09/02/2016   LDLCALC 57 09/02/2016   TRIG 104 09/02/2016   No results found for: DDIMER Invalid  input(s): POCBNP  Urinalysis    Component Value Date/Time   COLORURINE YELLOW (A) 12/11/2020 2026   APPEARANCEUR CLEAR (A) 12/11/2020 2026   LABSPEC 1.016 12/11/2020 2026   PHURINE 6.0 12/11/2020 2026   GLUCOSEU >=500 (A) 12/11/2020 2026   HGBUR NEGATIVE 12/11/2020 2026   BILIRUBINUR NEGATIVE 12/11/2020 2026   KETONESUR 20 (A) 12/11/2020 2026   PROTEINUR NEGATIVE 12/11/2020 2026   UROBILINOGEN 1.0 03/27/2014 1155   NITRITE NEGATIVE 12/11/2020 2026   LEUKOCYTESUR NEGATIVE 12/11/2020 2026    COVID-19 Labs No results for input(s): DDIMER, FERRITIN, LDH, CRP in the last 72 hours. Lab Results  Component Value Date   SARSCOV2NAA NEGATIVE 12/11/2020   [place] NEGATIVE 05/05/2019    Radiological Exams on Admission: DG Chest 2 View  Result Date: 12/11/2020 CLINICAL DATA:  Cough EXAM: CHEST - 2 VIEW COMPARISON:  05/13/2014 FINDINGS: Prior CABG. Heart and mediastinal contours are within normal limits. No focal opacities or effusions. No acute bony abnormality. IMPRESSION: No active cardiopulmonary disease. Electronically Signed   By: Rolm Baptise M.D.   On: 12/11/2020 18:32    EKG: Independently reviewed.  Pending  Echocardiogram 07/ 2022 : 1. Left ventricular ejection fraction, by estimation, is >55%. The left ventricle has low normal function. Left ventricular endocardial border not optimally defined to evaluate regional wall motion. The left ventricular internal cavity size was mildly dilated. Left ventricular diastolic parameters are consistent with Grade II diastolic dysfunction (pseudonormalization). 2. Right ventricular systolic function is normal. The right ventricular size is normal. 3. The mitral valve is normal in structure. No evidence of mitral valve regurgitation. No evidence of mitral stenosis. 4. The aortic valve is tricuspid. Aortic valve regurgitation is not visualized. No aortic stenosis is present. Comparison(s): Previous Echo showed LV EF 50-55%, inferior  wall hypokinesis, grade   Assessment/Plan Principal Problem:   Cough Active Problems:   S/P CABG x 3   Essential hypertension   Hyponatremia   Hypokalemia   NSTEMI (non-ST elevated myocardial infarction) (HCC)   Chronic combined systolic and diastolic congestive heart failure (HCC)   Coronary artery disease involving native coronary artery of native heart without angina pectoris Cough: Suspect right side heart failure. We will provide supportive care and strict I/o Stat CTA for PE that may produce cough and cause elevated troponin picture.   Cad/ CABG/ NSTEMI: Results for Danny [name], Danny [name] (MRN [id number]) as of 12/12/2020 00:09  Ref. Range 12/11/2020 20:26 12/11/2020 22:29  Troponin I (High Sensitivity) Latest Ref Range: <18 ng/L 2,698 Danny [name]) 3,512 (HH)   We will start pt on heparin gtt. Cont aspirin, amlodipine, metoprolol, as needed sublingual nitroglycerin. Continue rosuvastatin.  Hyponatremia/hypochloremia:  Attribute to natriuresis secondary to diuretics. Will currently hold patient's Lasix. Continue with a heart healthy diet.  Hypokalemia: Will replace and follow levels.   DVT prophylaxis:  Heparin gtt   Code Status:  Full code    Family Communication:  Rollo,carol (Spouse)  [phone] (Mobile)   Disposition Plan:  Home    Consults called:  Cardiology consult per edmd.   Admission status: Inpatient.     Para Skeans MD [facility] [phone] How to contact the [name] Attending or Consulting provider [place] or covering provider during after hours 7P -  7A, for this patient.    Check the care team in [facility] and look for a) attending/consulting [facility] provider listed and b) the [facility] team listed Log into [email or web address] and use [place]'s universal password to access. If you do not have the password, please contact the [name] operator. Locate the [facility] provider you are looking for under [facility] and page to a number that you can be  directly reached. If you still have difficulty reaching the provider, please page the [facility] (Director on Call) for the Hospitalists listed on amion for assistance. [email or web address] Password [facility] 12/12/2020, 12:38 AM

## 2020-12-11 NOTE — ED Notes (Signed)
Pt given urinal and instructed on use for sample 

## 2020-12-12 ENCOUNTER — Inpatient Hospital Stay: Payer: Medicare Other

## 2020-12-12 ENCOUNTER — Encounter
Admission: EM | Disposition: A | Discharge status: Home / Self Care | Payer: Self-pay | Source: Home / Self Care | Attending: Internal Medicine

## 2020-12-12 ENCOUNTER — Inpatient Hospital Stay: Admit: 2020-12-12 | Payer: Medicare Other

## 2020-12-12 ENCOUNTER — Encounter: Payer: Self-pay | Admitting: Internal Medicine

## 2020-12-12 DIAGNOSIS — E871 Hypo-osmolality and hyponatremia: Secondary | ICD-10-CM | POA: Diagnosis present

## 2020-12-12 DIAGNOSIS — E785 Hyperlipidemia, unspecified: Secondary | ICD-10-CM | POA: Diagnosis present

## 2020-12-12 DIAGNOSIS — I5042 Chronic combined systolic (congestive) and diastolic (congestive) heart failure: Secondary | ICD-10-CM | POA: Diagnosis present

## 2020-12-12 DIAGNOSIS — Z951 Presence of aortocoronary bypass graft: Secondary | ICD-10-CM | POA: Diagnosis not present

## 2020-12-12 DIAGNOSIS — I2510 Atherosclerotic heart disease of native coronary artery without angina pectoris: Secondary | ICD-10-CM | POA: Diagnosis present

## 2020-12-12 DIAGNOSIS — Z6841 Body Mass Index (BMI) 40.0 and over, adult: Secondary | ICD-10-CM | POA: Diagnosis not present

## 2020-12-12 DIAGNOSIS — E876 Hypokalemia: Secondary | ICD-10-CM

## 2020-12-12 DIAGNOSIS — R0902 Hypoxemia: Secondary | ICD-10-CM | POA: Diagnosis present

## 2020-12-12 DIAGNOSIS — Z7982 Long term (current) use of aspirin: Secondary | ICD-10-CM | POA: Diagnosis not present

## 2020-12-12 DIAGNOSIS — E7800 Pure hypercholesterolemia, unspecified: Secondary | ICD-10-CM | POA: Diagnosis not present

## 2020-12-12 DIAGNOSIS — I252 Old myocardial infarction: Secondary | ICD-10-CM | POA: Diagnosis not present

## 2020-12-12 DIAGNOSIS — J121 Respiratory syncytial virus pneumonia: Secondary | ICD-10-CM | POA: Diagnosis present

## 2020-12-12 DIAGNOSIS — I214 Non-ST elevation (NSTEMI) myocardial infarction: Secondary | ICD-10-CM | POA: Diagnosis present

## 2020-12-12 DIAGNOSIS — I25810 Atherosclerosis of coronary artery bypass graft(s) without angina pectoris: Secondary | ICD-10-CM

## 2020-12-12 DIAGNOSIS — I10 Essential (primary) hypertension: Secondary | ICD-10-CM | POA: Diagnosis not present

## 2020-12-12 DIAGNOSIS — Z8249 Family history of ischemic heart disease and other diseases of the circulatory system: Secondary | ICD-10-CM | POA: Diagnosis not present

## 2020-12-12 DIAGNOSIS — Z833 Family history of diabetes mellitus: Secondary | ICD-10-CM | POA: Diagnosis not present

## 2020-12-12 DIAGNOSIS — R059 Cough, unspecified: Secondary | ICD-10-CM | POA: Diagnosis not present

## 2020-12-12 DIAGNOSIS — I255 Ischemic cardiomyopathy: Secondary | ICD-10-CM | POA: Diagnosis present

## 2020-12-12 DIAGNOSIS — N183 Chronic kidney disease, stage 3 unspecified: Secondary | ICD-10-CM | POA: Diagnosis present

## 2020-12-12 DIAGNOSIS — R0602 Shortness of breath: Secondary | ICD-10-CM | POA: Diagnosis not present

## 2020-12-12 DIAGNOSIS — R531 Weakness: Secondary | ICD-10-CM | POA: Diagnosis present

## 2020-12-12 DIAGNOSIS — R051 Acute cough: Secondary | ICD-10-CM | POA: Diagnosis not present

## 2020-12-12 DIAGNOSIS — Z888 Allergy status to other drugs, medicaments and biological substances status: Secondary | ICD-10-CM | POA: Diagnosis not present

## 2020-12-12 DIAGNOSIS — I130 Hypertensive heart and chronic kidney disease with heart failure and stage 1 through stage 4 chronic kidney disease, or unspecified chronic kidney disease: Secondary | ICD-10-CM | POA: Diagnosis present

## 2020-12-12 DIAGNOSIS — Z79899 Other long term (current) drug therapy: Secondary | ICD-10-CM | POA: Diagnosis not present

## 2020-12-12 DIAGNOSIS — Z20822 Contact with and (suspected) exposure to covid-19: Secondary | ICD-10-CM | POA: Diagnosis present

## 2020-12-12 DIAGNOSIS — I248 Other forms of acute ischemic heart disease: Secondary | ICD-10-CM | POA: Diagnosis not present

## 2020-12-12 DIAGNOSIS — E1122 Type 2 diabetes mellitus with diabetic chronic kidney disease: Secondary | ICD-10-CM | POA: Diagnosis present

## 2020-12-12 HISTORY — PX: LEFT HEART CATH AND CORS/GRAFTS ANGIOGRAPHY: CATH118250

## 2020-12-12 LAB — BASIC METABOLIC PANEL
Anion gap: 11 (ref 5–15)
Anion gap: 10 (ref 5–15)
BUN: 19 mg/dL (ref 8–23)
BUN: 24 mg/dL — ABNORMAL HIGH (ref 8–23)
CO2: 31 mmol/L (ref 22–32)
CO2: 25 mmol/L (ref 22–32)
Calcium: 8.8 mg/dL — ABNORMAL LOW (ref 8.9–10.3)
Calcium: 8.2 mg/dL — ABNORMAL LOW (ref 8.9–10.3)
Chloride: 82 mmol/L — ABNORMAL LOW (ref 98–111)
Chloride: 85 mmol/L — ABNORMAL LOW (ref 98–111)
Creatinine, Ser: 1.36 mg/dL — ABNORMAL HIGH (ref 0.61–1.24)
Creatinine, Ser: 1.36 mg/dL — ABNORMAL HIGH (ref 0.61–1.24)
GFR, Estimated: 58 mL/min — ABNORMAL LOW (ref >60)
GFR, Estimated: 58 mL/min — ABNORMAL LOW (ref >60)
Glucose, Bld: 114 mg/dL — ABNORMAL HIGH (ref 70–99)
Glucose, Bld: 222 mg/dL — ABNORMAL HIGH (ref 70–99)
Potassium: 2.7 mmol/L — CL (ref 3.5–5.1)
Potassium: 3.4 mmol/L — ABNORMAL LOW (ref 3.5–5.1)
Sodium: 124 mmol/L — ABNORMAL LOW (ref 135–145)
Sodium: 120 mmol/L — ABNORMAL LOW (ref 135–145)

## 2020-12-12 LAB — COMPREHENSIVE METABOLIC PANEL
ALT: 50 U/L — ABNORMAL HIGH (ref 0–44)
AST: 63 U/L — ABNORMAL HIGH (ref 15–41)
Albumin: 4.3 g/dL (ref 3.5–5.0)
Alkaline Phosphatase: 50 U/L (ref 38–126)
Anion gap: 9 (ref 5–15)
BUN: 20 mg/dL (ref 8–23)
CO2: 30 mmol/L (ref 22–32)
Calcium: 8.7 mg/dL — ABNORMAL LOW (ref 8.9–10.3)
Chloride: 84 mmol/L — ABNORMAL LOW (ref 98–111)
Creatinine, Ser: 1.17 mg/dL (ref 0.61–1.24)
GFR, Estimated: >60 mL/min (ref >60)
Glucose, Bld: 115 mg/dL — ABNORMAL HIGH (ref 70–99)
Potassium: 2.7 mmol/L — CL (ref 3.5–5.1)
Sodium: 123 mmol/L — ABNORMAL LOW (ref 135–145)
Total Bilirubin: 1.4 mg/dL — ABNORMAL HIGH (ref 0.3–1.2)
Total Protein: 7.5 g/dL (ref 6.5–8.1)

## 2020-12-12 LAB — CBC WITH DIFFERENTIAL/PLATELET
Abs Immature Granulocytes: 0.06 10*3/uL (ref 0.00–0.07)
Basophils Absolute: 0.1 10*3/uL (ref 0.0–0.1)
Basophils Relative: 1 %
Eosinophils Absolute: 0 10*3/uL (ref 0.0–0.5)
Eosinophils Relative: 0 %
HCT: 45.3 % (ref 39.0–52.0)
Hemoglobin: 17 g/dL (ref 13.0–17.0)
Immature Granulocytes: 1 %
Lymphocytes Relative: 13 %
Lymphs Abs: 1.5 10*3/uL (ref 0.7–4.0)
MCH: 30.5 pg (ref 26.0–34.0)
MCHC: 37.5 g/dL — ABNORMAL HIGH (ref 30.0–36.0)
MCV: 81.3 fL (ref 80.0–100.0)
Monocytes Absolute: 1 10*3/uL (ref 0.1–1.0)
Monocytes Relative: 9 %
Neutro Abs: 8.6 10*3/uL — ABNORMAL HIGH (ref 1.7–7.7)
Neutrophils Relative %: 76 %
Platelets: 244 10*3/uL (ref 150–400)
RBC: 5.57 MIL/uL (ref 4.22–5.81)
RDW: 12.5 % (ref 11.5–15.5)
WBC: 11.3 10*3/uL — ABNORMAL HIGH (ref 4.0–10.5)
nRBC: 0 % (ref 0.0–0.2)

## 2020-12-12 LAB — CBC
Hemoglobin: 16.1 g/dL (ref 13.0–17.0)
Platelets: 237 10*3/uL (ref 150–400)
WBC: 11.6 10*3/uL — ABNORMAL HIGH (ref 4.0–10.5)

## 2020-12-12 LAB — MRSA NEXT GEN BY PCR, NASAL: MRSA by PCR Next Gen: NOT DETECTED

## 2020-12-12 LAB — HEPARIN LEVEL (UNFRACTIONATED)
Heparin Unfractionated: 0.36 IU/mL (ref 0.30–0.70)
Heparin Unfractionated: 0.24 IU/mL — ABNORMAL LOW (ref 0.30–0.70)

## 2020-12-12 LAB — TROPONIN I (HIGH SENSITIVITY)
Troponin I (High Sensitivity): 4898 ng/L (ref <18)
Troponin I (High Sensitivity): 2789 ng/L (ref <18)
Troponin I (High Sensitivity): 2762 ng/L (ref <18)
Troponin I (High Sensitivity): 2148 ng/L (ref <18)

## 2020-12-12 LAB — LIPID PANEL
Cholesterol: 91 mg/dL (ref 0–200)
HDL: 33 mg/dL — ABNORMAL LOW
LDL Cholesterol: 38 mg/dL (ref 0–99)
Total CHOL/HDL Ratio: 2.8 ratio
Triglycerides: 102 mg/dL
VLDL: 20 mg/dL (ref 0–40)

## 2020-12-12 LAB — MAGNESIUM: Magnesium: 2.3 mg/dL (ref 1.7–2.4)

## 2020-12-12 LAB — HIV ANTIBODY (ROUTINE TESTING W REFLEX): HIV Screen 4th Generation wRfx: NONREACTIVE

## 2020-12-12 LAB — CBG MONITORING, ED: Glucose-Capillary: 226 mg/dL — ABNORMAL HIGH (ref 70–99)

## 2020-12-12 LAB — PHOSPHORUS: Phosphorus: 3.1 mg/dL (ref 2.5–4.6)

## 2020-12-12 SURGERY — LEFT HEART CATH AND CORS/GRAFTS ANGIOGRAPHY
Anesthesia: Moderate Sedation

## 2020-12-12 MED ORDER — SODIUM CHLORIDE 0.9% FLUSH: 3.0000 mL | Freq: Two times a day (BID) | INTRAVENOUS | Status: DC

## 2020-12-12 MED ORDER — ASPIRIN 81 MG PO CHEW
81.0000 mg | CHEWABLE_TABLET | ORAL | Status: AC
  Administered 2020-12-12: 81 mg via ORAL

## 2020-12-12 MED ORDER — AMLODIPINE BESYLATE 10 MG PO TABS
10.0000 mg | ORAL_TABLET | Freq: Every day | ORAL | Status: DC
  Administered 2020-12-12 – 2020-12-16 (×5): 10 mg via ORAL
  Filled 2020-12-12: qty 1
  Filled 2020-12-12: qty 2
  Filled 2020-12-12 (×3): qty 1

## 2020-12-12 MED ORDER — FENTANYL CITRATE (PF) 100 MCG/2ML IJ SOLN
INTRAMUSCULAR | Status: DC | PRN
  Administered 2020-12-12: 25 ug via INTRAVENOUS

## 2020-12-12 MED ORDER — SODIUM CHLORIDE 0.9 % IV SOLN
250.0000 mL | INTRAVENOUS | Status: DC | PRN
Start: 2020-12-12 — End: 2020-12-12

## 2020-12-12 MED ORDER — POTASSIUM CHLORIDE 10 MEQ/100ML IV SOLN
10.0000 meq | INTRAVENOUS | Status: AC
  Administered 2020-12-12 (×6): 10 meq via INTRAVENOUS
  Filled 2020-12-12 (×5): qty 100

## 2020-12-12 MED ORDER — GUAIFENESIN-CODEINE 100-10 MG/5ML PO SOLN
10.0000 mL | ORAL | Status: DC | PRN
  Administered 2020-12-12 – 2020-12-15 (×7): 10 mL via ORAL
  Filled 2020-12-12 (×8): qty 10

## 2020-12-12 MED ORDER — HYDRALAZINE HCL 20 MG/ML IJ SOLN: 10.0000 mg | INTRAMUSCULAR | Status: AC | PRN

## 2020-12-12 MED ORDER — SODIUM CHLORIDE 0.9 % IV SOLN: 250.0000 mL | INTRAVENOUS | Status: DC | PRN

## 2020-12-12 MED ORDER — ONDANSETRON HCL 4 MG/2ML IJ SOLN
4.0000 mg | Freq: Four times a day (QID) | INTRAMUSCULAR | Status: DC | PRN
  Administered 2020-12-12: 4 mg via INTRAVENOUS
  Filled 2020-12-12: qty 2

## 2020-12-12 MED ORDER — ASPIRIN EC 81 MG PO TBEC: 81.0000 mg | DELAYED_RELEASE_TABLET | Freq: Every day | ORAL | Status: DC

## 2020-12-12 MED ORDER — PANTOPRAZOLE SODIUM 40 MG PO TBEC
40.0000 mg | DELAYED_RELEASE_TABLET | Freq: Every day | ORAL | Status: DC
  Administered 2020-12-12 – 2020-12-16 (×5): 40 mg via ORAL
  Filled 2020-12-12 (×5): qty 1

## 2020-12-12 MED ORDER — INSULIN PUMP
Freq: Three times a day (TID) | SUBCUTANEOUS | Status: DC
  Administered 2020-12-14: 23:00:00 13.4 via SUBCUTANEOUS
  Administered 2020-12-15: 10 via SUBCUTANEOUS
  Filled 2020-12-12: qty 1

## 2020-12-12 MED ORDER — CLOPIDOGREL BISULFATE 75 MG PO TABS
75.0000 mg | ORAL_TABLET | Freq: Every day | ORAL | Status: DC
  Administered 2020-12-12 – 2020-12-16 (×5): 75 mg via ORAL
  Filled 2020-12-12 (×5): qty 1

## 2020-12-12 MED ORDER — ENOXAPARIN SODIUM 40 MG/0.4ML IJ SOSY
40.0000 mg | PREFILLED_SYRINGE | INTRAMUSCULAR | Status: DC
  Administered 2020-12-13 – 2020-12-16 (×4): 40 mg via SUBCUTANEOUS
  Filled 2020-12-12 (×6): qty 0.4

## 2020-12-12 MED ORDER — HEPARIN BOLUS VIA INFUSION
1450.0000 [IU] | Freq: Once | INTRAVENOUS | Status: AC
  Administered 2020-12-12: 1450 [IU] via INTRAVENOUS
  Filled 2020-12-12: qty 1450

## 2020-12-12 MED ORDER — MIDAZOLAM HCL 2 MG/2ML IJ SOLN
INTRAMUSCULAR | Status: DC | PRN
  Administered 2020-12-12: 1 mg via INTRAVENOUS

## 2020-12-12 MED ORDER — ASPIRIN 81 MG PO CHEW
CHEWABLE_TABLET | ORAL | Status: AC
  Filled 2020-12-12: qty 1

## 2020-12-12 MED ORDER — NITROGLYCERIN 0.4 MG SL SUBL: 0.4000 mg | SUBLINGUAL_TABLET | SUBLINGUAL | Status: DC | PRN

## 2020-12-12 MED ORDER — ISOSORBIDE MONONITRATE ER 30 MG PO TB24
30.0000 mg | ORAL_TABLET | Freq: Every day | ORAL | Status: DC
  Administered 2020-12-12 – 2020-12-16 (×5): 30 mg via ORAL
  Filled 2020-12-12 (×5): qty 1

## 2020-12-12 MED ORDER — CHLORHEXIDINE GLUCONATE CLOTH 2 % EX PADS
6.0000 | MEDICATED_PAD | Freq: Every day | CUTANEOUS | Status: DC
  Administered 2020-12-12 – 2020-12-13 (×2): 6 via TOPICAL

## 2020-12-12 MED ORDER — IOHEXOL 350 MG/ML SOLN
INTRAVENOUS | Status: DC | PRN
  Administered 2020-12-12: 45 mL

## 2020-12-12 MED ORDER — IOHEXOL 350 MG/ML SOLN
100.0000 mL | Freq: Once | INTRAVENOUS | Status: AC | PRN
  Administered 2020-12-12: 100 mL via INTRAVENOUS

## 2020-12-12 MED ORDER — SODIUM CHLORIDE 0.9 % WEIGHT BASED INFUSION: 3.0000 mL/kg/h | INTRAVENOUS | Status: DC

## 2020-12-12 MED ORDER — ASPIRIN 81 MG PO CHEW: 324.0000 mg | CHEWABLE_TABLET | ORAL | Status: DC

## 2020-12-12 MED ORDER — METOPROLOL SUCCINATE ER 50 MG PO TB24
100.0000 mg | ORAL_TABLET | Freq: Every day | ORAL | Status: DC
  Administered 2020-12-12 – 2020-12-16 (×5): 100 mg via ORAL
  Filled 2020-12-12 (×5): qty 2

## 2020-12-12 MED ORDER — SODIUM CHLORIDE 0.9% FLUSH
3.0000 mL | Freq: Two times a day (BID) | INTRAVENOUS | Status: DC
  Administered 2020-12-12 – 2020-12-16 (×7): 3 mL via INTRAVENOUS

## 2020-12-12 MED ORDER — HEPARIN (PORCINE) IN NACL 1000-0.9 UT/500ML-% IV SOLN
INTRAVENOUS | Status: DC | PRN
  Administered 2020-12-12: 500 mL

## 2020-12-12 MED ORDER — SODIUM CHLORIDE 0.9% FLUSH: 3.0000 mL | INTRAVENOUS | Status: DC | PRN

## 2020-12-12 MED ORDER — ASPIRIN 300 MG RE SUPP: 300.0000 mg | RECTAL | Status: DC

## 2020-12-12 MED ORDER — ROSUVASTATIN CALCIUM 20 MG PO TABS
40.0000 mg | ORAL_TABLET | Freq: Every day | ORAL | Status: DC
  Administered 2020-12-12 – 2020-12-15 (×4): 40 mg via ORAL
  Filled 2020-12-12: qty 4
  Filled 2020-12-12 (×2): qty 2
  Filled 2020-12-12: qty 4
  Filled 2020-12-12: qty 2

## 2020-12-12 MED ORDER — CLOPIDOGREL BISULFATE 75 MG PO TABS: 75.0000 mg | ORAL_TABLET | Freq: Once | ORAL | Status: DC

## 2020-12-12 MED ORDER — LABETALOL HCL 5 MG/ML IV SOLN
10.0000 mg | INTRAVENOUS | Status: AC | PRN
Start: 2020-12-12 — End: 2020-12-13

## 2020-12-12 MED ORDER — LIDOCAINE HCL (PF) 1 % IJ SOLN
INTRAMUSCULAR | Status: DC | PRN
  Administered 2020-12-12: 2 mL

## 2020-12-12 MED ORDER — HEPARIN SODIUM (PORCINE) 1000 UNIT/ML IJ SOLN
INTRAMUSCULAR | Status: AC
  Filled 2020-12-12: qty 1

## 2020-12-12 MED ORDER — MIDAZOLAM HCL 2 MG/2ML IJ SOLN
INTRAMUSCULAR | Status: AC
  Filled 2020-12-12: qty 2

## 2020-12-12 MED ORDER — ASPIRIN EC 81 MG PO TBEC
81.0000 mg | DELAYED_RELEASE_TABLET | Freq: Every day | ORAL | Status: DC
  Administered 2020-12-13 – 2020-12-16 (×4): 81 mg via ORAL
  Filled 2020-12-12 (×4): qty 1

## 2020-12-12 MED ORDER — HEPARIN SODIUM (PORCINE) 1000 UNIT/ML IJ SOLN
INTRAMUSCULAR | Status: DC | PRN
  Administered 2020-12-12: 5000 [IU] via INTRAVENOUS

## 2020-12-12 MED ORDER — FENTANYL CITRATE (PF) 100 MCG/2ML IJ SOLN
INTRAMUSCULAR | Status: AC
  Filled 2020-12-12: qty 2

## 2020-12-12 MED ORDER — SODIUM CHLORIDE 0.9 % WEIGHT BASED INFUSION
1.0000 mL/kg/h | INTRAVENOUS | Status: DC
  Administered 2020-12-12: 0.079 mL/kg/h via INTRAVENOUS

## 2020-12-12 MED ORDER — LIDOCAINE HCL 1 % IJ SOLN
INTRAMUSCULAR | Status: AC
  Filled 2020-12-12: qty 20

## 2020-12-12 MED ORDER — VERAPAMIL HCL 2.5 MG/ML IV SOLN
INTRAVENOUS | Status: AC
  Filled 2020-12-12: qty 2

## 2020-12-12 MED ORDER — ALUM & MAG HYDROXIDE-SIMETH 200-200-20 MG/5ML PO SUSP
30.0000 mL | ORAL | Status: DC | PRN
  Administered 2020-12-12: 30 mL via ORAL
  Filled 2020-12-12: qty 30

## 2020-12-12 MED ORDER — POTASSIUM CHLORIDE CRYS ER 20 MEQ PO TBCR
40.0000 meq | EXTENDED_RELEASE_TABLET | Freq: Once | ORAL | Status: AC
  Administered 2020-12-13: 40 meq via ORAL
  Filled 2020-12-12: qty 2

## 2020-12-12 MED ORDER — VERAPAMIL HCL 2.5 MG/ML IV SOLN
INTRAVENOUS | Status: DC | PRN
  Administered 2020-12-12: 2.5 mg via INTRA_ARTERIAL

## 2020-12-12 MED ORDER — ACETAMINOPHEN 325 MG PO TABS: 650.0000 mg | ORAL_TABLET | ORAL | Status: DC | PRN

## 2020-12-12 SURGICAL SUPPLY — 10 items
CATH INFINITI 5 FR IM (CATHETERS) ×2 IMPLANT
CATH INFINITI 5FR JL4 (CATHETERS) ×2 IMPLANT
DEVICE RAD TR BAND REGULAR (VASCULAR PRODUCTS) ×2 IMPLANT
DRAPE BRACHIAL (DRAPES) ×2 IMPLANT
GLIDESHEATH SLEND SS 6F .021 (SHEATH) ×2 IMPLANT
PACK CARDIAC CATH (CUSTOM PROCEDURE TRAY) ×2 IMPLANT
PROTECTION STATION PRESSURIZED (MISCELLANEOUS) ×2
SET ATX SIMPLICITY (MISCELLANEOUS) ×2 IMPLANT
STATION PROTECTION PRESSURIZED (MISCELLANEOUS) ×1 IMPLANT
WIRE HI TORQ VERSACORE J 260CM (WIRE) ×2 IMPLANT

## 2020-12-12 NOTE — ED Notes (Signed)
Report from [facility] 

## 2020-12-12 NOTE — Progress Notes (Signed)
PROGRESS NOTE    Danny [name]  ATF:[id number] DOB: 10-08-1955 DOA: 12/11/2020 PCP: Lorenso Quarry, NP     Brief Narrative:  65 y.o. WM PMHx HTN, Chronic combined Systolic & Diastolic CHF, ischemic cardiomyopathy (EF 45 to 50%), CAD, HLD, DM type II uncontrolled with hyperglycemia.  Presents with complaints of cough for past few weeks.  Patient complains of nausea vomiting cough no shortness of breath no chest pain no palpitations no headaches blurred vision speech or gait issues, abdomen is distended nontender bowel sounds are within normal limits on exam.  Patient denies any other complaints.     Subjective: A/O x4, positive CP, positive SOB, positive nonproductive cough.   Assessment & Plan: Covid vaccination;   Principal Problem:   Cough Active Problems:   S/P CABG x 3   Chronic combined systolic and diastolic congestive heart failure (HCC)   Essential hypertension   Coronary artery disease involving native coronary artery of native heart without angina pectoris   Hyponatremia   Hypokalemia   Non-ST elevation (NSTEMI) myocardial infarction (HCC)   Severe obesity (BMI >= 40) (HCC)  NSTEMI (non-ST elevated myocardial infarction) (HCC)-s/p CABG x3   Chronic combined systolic and diastolic congestive heart failure (HCC)   Coronary artery disease involving native coronary artery of native heart without angina pectoris -10/28 cardiology consult  -10/28 heart catheterization pending.  -Strict ins and out - Daily weight  CAD - See NSTEMI  Nonproductive cough -SARS coronavirus negative - 10/28 we will obtain respiratory virus panel, given that RSV is endemic - Continue Mucinex-codeine PRN cough - 10/28 CTA PE protocol negative  Hyponatremia - Hold diuretics - Normal saline bolus 500 mill x1 - Repeat BMP@2000   Hypokalemia: -Potassium goal> 4 - Potassium IV 60 mEq   Severe obesity (BMI 42.57 kg/m) -    DVT prophylaxis: Lovenox Code Status:  Full Family Communication:  Status is: Inpatient    Dispo: The patient is from:               Anticipated d/c is to: Home              Anticipated d/c date is: 2 days              Patient currently is not medically stable to d/c.      Consultants:  Cardiology  Procedures/Significant Events:  10/28 cardiac catheterization  I have personally reviewed and interpreted all radiology studies and my findings are as above.  VENTILATOR SETTINGS:    Cultures   Antimicrobials:    Devices    LINES / TUBES:      Continuous Infusions:  sodium chloride       Objective: Vitals:   12/12/20 1706 12/12/20 1830 12/12/20 1845 12/12/20 1900  BP: 139/65 128/72  (!) 159/77  Pulse: 80 83  85  Resp: 18 20 (!) 22 (!) 24  Temp: 99.3 F (37.4 C)     TempSrc: Oral     SpO2: 94% 92%  96%  Weight:      Height:        Intake/Output Summary (Last 24 hours) at 12/12/2020 1912 Last data filed at 12/12/2020 2706 Gross per 24 hour  Intake 499.5 ml  Output 350 ml  Net 149.5 ml   Filed Weights   12/11/20 1732  Weight: 127 kg    Examination:  General: A/O x4, No acute respiratory distress Eyes: negative scleral hemorrhage, negative anisocoria, negative icterus ENT: Negative Runny nose, negative gingival bleeding, Neck:  Negative  scars, masses, torticollis, lymphadenopathy, JVD Lungs: Clear to auscultation bilaterally without wheezes or crackles, positive nonproductive cough Cardiovascular: Regular rate and rhythm without murmur gallop or rub normal S1 and S2, positive substernal chest pain Abdomen: negative abdominal pain, nondistended, positive soft, bowel sounds, no rebound, no ascites, no appreciable mass Extremities: No significant cyanosis, clubbing, +1 bilateral lower extremity edema to knees Skin: Negative rashes, lesions, ulcers Psychiatric:  Negative depression, negative anxiety, negative fatigue, negative mania  Central nervous system:  Cranial nerves II through  XII intact, tongue/uvula midline, all extremities muscle strength 5/5, sensation intact throughout, f negative dysarthria, negative expressive aphasia, negative receptive aphasia.  .     Data Reviewed: Care during the described time interval was provided by me .  I have reviewed this patient's available data, including medical history, events of note, physical examination, and all test results as part of my evaluation.  CBC: Recent Labs  Lab 12/11/20 2026 12/12/20 0612 12/12/20 0831  WBC 11.5* 11.6* 11.3*  NEUTROABS 9.2*  --  8.6*  HGB 16.1 16.1 17.0  HCT 43.3 RESULTS UNAVAILABLE DUE TO INTERFERING SUBSTANCE 45.3  MCV 80.3 RESULTS UNAVAILABLE DUE TO INTERFERING SUBSTANCE 81.3  PLT 220 237 244   Basic Metabolic Panel: Recent Labs  Lab 12/11/20 2026 12/12/20 0612 12/12/20 0831  NA 120* 124* 123*  K 3.1* 2.7* 2.7*  CL 78* 82* 84*  CO2 27 31 30   GLUCOSE 358* 114* 115*  BUN 19 19 20   CREATININE 1.21 1.36* 1.17  CALCIUM 9.0 8.8* 8.7*  MG  --   --  2.3  PHOS  --   --  3.1   GFR: Estimated Creatinine Clearance: 82.8 mL/min (by C-G formula based on SCr of 1.17 mg/dL). Liver Function Tests: Recent Labs  Lab 12/11/20 2026 12/12/20 0831  AST 63* 63*  ALT 56* 50*  ALKPHOS 47 50  BILITOT 1.7* 1.4*  PROT 7.9 7.5  ALBUMIN 4.3 4.3   Recent Labs  Lab 12/11/20 2026  LIPASE 22   No results for input(s): AMMONIA in the last 168 hours. Coagulation Profile: Recent Labs  Lab 12/11/20 2229  INR 1.0   Cardiac Enzymes: No results for input(s): CKTOTAL, CKMB, CKMBINDEX, TROPONINI in the last 168 hours. BNP (last 3 results) Recent Labs    08/08/20 1258  PROBNP 141   HbA1C: No results for input(s): HGBA1C in the last 72 hours. CBG: Recent Labs  Lab 12/12/20 1215  GLUCAP 226*   Lipid Profile: Recent Labs    12/12/20 0612  CHOL 91  HDL 33*  LDLCALC 38  TRIG 12/14/20  CHOLHDL 2.8   Thyroid Function Tests: No results for input(s): TSH, T4TOTAL, FREET4, T3FREE,  THYROIDAB in the last 72 hours. Anemia Panel: No results for input(s): VITAMINB12, FOLATE, FERRITIN, TIBC, IRON, RETICCTPCT in the last 72 hours. Sepsis Labs: No results for input(s): PROCALCITON, LATICACIDVEN in the last 168 hours.  Recent Results (from the past 240 hour(s))  Resp Panel by RT-PCR (Flu A&B, Covid) Nasopharyngeal Swab     Status: None   Collection Time: 12/11/20  8:26 PM   Specimen: Nasopharyngeal Swab; Nasopharyngeal(NP) swabs in vial transport medium  Result Value Ref Range Status   SARS Coronavirus 2 by RT PCR NEGATIVE NEGATIVE Final    Comment: (NOTE) SARS-CoV-2 target nucleic acids are NOT DETECTED.  The SARS-CoV-2 RNA is generally detectable in upper respiratory specimens during the acute phase of infection. The lowest concentration of SARS-CoV-2 viral copies this assay can detect is 138 copies/mL. A  negative result does not preclude SARS-Cov-2 infection and should not be used as the sole basis for treatment or other patient management decisions. A negative result may occur with  improper specimen collection/handling, submission of specimen other than nasopharyngeal swab, presence of viral mutation(s) within the areas targeted by this assay, and inadequate number of viral copies(<138 copies/mL). A negative result must be combined with clinical observations, patient history, and epidemiological information. The expected result is Negative.  Fact Sheet for Patients:  BloggerCourse.com  Fact Sheet for Healthcare Providers:  SeriousBroker.it  This test is no t yet approved or cleared by the [place] FDA and  has been authorized for detection and/or diagnosis of SARS-CoV-2 by FDA under an Emergency Use Authorization (EUA). This EUA will remain  in effect (meaning this test can be used) for the duration of the COVID-19 declaration under Section 564(b)(1) of the Act, 21 U.S.C.section 360bbb-3(b)(1), unless the  authorization is terminated  or revoked sooner.       Influenza A by PCR NEGATIVE NEGATIVE Final   Influenza B by PCR NEGATIVE NEGATIVE Final    Comment: (NOTE) The Xpert Xpress SARS-CoV-2/FLU/RSV plus assay is intended as an aid in the diagnosis of influenza from Nasopharyngeal swab specimens and should not be used as a sole basis for treatment. Nasal washings and aspirates are unacceptable for Xpert Xpress SARS-CoV-2/FLU/RSV testing.  Fact Sheet for Patients: BloggerCourse.com  Fact Sheet for Healthcare Providers: SeriousBroker.it  This test is not yet approved or cleared by the [place] FDA and has been authorized for detection and/or diagnosis of SARS-CoV-2 by FDA under an Emergency Use Authorization (EUA). This EUA will remain in effect (meaning this test can be used) for the duration of the COVID-19 declaration under Section 564(b)(1) of the Act, 21 U.S.C. section 360bbb-3(b)(1), unless the authorization is terminated or revoked.  Performed at [facility], [address], [place], [place] [address]          Radiology Studies: DG Chest 2 View  Result Date: 12/11/2020 CLINICAL DATA:  Cough EXAM: CHEST - 2 VIEW COMPARISON:  05/13/2014 FINDINGS: Prior CABG. Heart and mediastinal contours are within normal limits. No focal opacities or effusions. No acute bony abnormality. IMPRESSION: No active cardiopulmonary disease. Electronically Signed   By: Charlett Nose M.D.   On: 12/11/2020 18:32   CT Angio Chest Pulmonary Embolism (PE) W or WO Contrast  Result Date: 12/12/2020 CLINICAL DATA:  Dizziness and weakness. EXAM: CT ANGIOGRAPHY CHEST WITH CONTRAST TECHNIQUE: Multidetector CT imaging of the chest was performed using the standard protocol during bolus administration of intravenous contrast. Multiplanar CT image reconstructions and MIPs were obtained to evaluate the vascular anatomy. CONTRAST:   OMNIPAQUE IOHEXOL 350 MG/ML SOLN COMPARISON:  None. FINDINGS: Cardiovascular: Satisfactory opacification of the pulmonary arteries to the segmental level. No evidence of pulmonary embolism. Mild cardiomegaly with marked severity coronary artery calcification. No pericardial effusion. Mediastinum/Nodes: No enlarged mediastinal, hilar, or axillary lymph nodes. Thyroid gland, trachea, and esophagus demonstrate no significant findings. Lungs/Pleura: Very mild linear atelectasis is seen within the bilateral lower lobes. There is no evidence of acute infiltrate, pleural effusion or pneumothorax. Upper Abdomen: There is a small hiatal hernia. Diffuse fatty infiltration of the liver parenchyma is noted. Musculoskeletal: Multiple sternal wires are seen. Degenerative changes seen throughout the thoracic spine. Review of the MIP images confirms the above findings. IMPRESSION: 1. No evidence of pulmonary embolism or acute infiltrate. 2. Mild cardiomegaly with marked severity coronary artery calcification. 3. Small hiatal  hernia. 4. Fatty liver. Electronically Signed   By: Aram Candela M.D.   On: 12/12/2020 01:37   CARDIAC CATHETERIZATION  Result Date: 12/12/2020 Conclusions: Severe two-vessel coronary artery disease with chronic total occlusions of the mid LAD and ostial LCx, as noted on prior catheterizations. Mild, nonobstructive RCA disease with patent PDA stent. Widely patent LIMA-LAD bypass graft.  SVG-D1 and SVG-OM1 are known to be occluded and were not engaged on today's study. Mildly elevated left ventricular filling pressure (LVEDP 15-20 mmHg) with grossly normal left ventricular contraction. Recommendations: No culprit lesion for patient's elevated troponin identified.  There may be slight worsening of disease involving distal D1 branch.  I suspect his troponin elevation reflects supply-demand mismatch in the setting of respiratory illness and significant fixed CAD.  Continue secondary prevention of coronary  artery disease. Maintain net even to slightly negative fluid balance. Further work-up and management of acute respiratory illness per internal medicine.  I do not believe the patient's coronary artery disease and mildly elevated filling pressures explain his symptoms and worsening hypoxia. Yvonne Kendall, MD [facility]  DG Chest Portable 1 View  Result Date: 12/12/2020 CLINICAL DATA:  Cold symptoms EXAM: PORTABLE CHEST 1 VIEW COMPARISON:  12/11/2020 FINDINGS: Post sternotomy changes. Cardiomegaly with mild central congestion but no edema, pleural effusion, or focal airspace disease. No pneumothorax. IMPRESSION: Cardiomegaly with mild central congestion. Electronically Signed   By: Jasmine Pang M.D.   On: 12/12/2020 15:52        Scheduled Meds:  [MAR Hold] amLODipine  10 mg Oral Daily   aspirin       [MAR Hold] aspirin EC  81 mg Oral Daily   [MAR Hold] clopidogrel  75 mg Oral Daily   [START ON 12/13/2020] enoxaparin (LOVENOX) injection  40 mg Subcutaneous Q24H   [MAR Hold] insulin pump   Subcutaneous TID WC, HS, 0200   [MAR Hold] isosorbide mononitrate  30 mg Oral Daily   [MAR Hold] metoprolol succinate  100 mg Oral Daily   [MAR Hold] pantoprazole  40 mg Oral Daily   [MAR Hold] rosuvastatin  40 mg Oral Daily   sodium chloride flush  3 mL Intravenous Q12H   Continuous Infusions:  sodium chloride       LOS: 0 days    Time spent:40 min    Mio Schellinger, Roselind Messier, MD [facility]   If 7PM-7AM, please contact night-coverage 12/12/2020, 7:12 PM

## 2020-12-12 NOTE — Progress Notes (Signed)
ANTICOAGULATION CONSULT NOTE - Initial Consult  Pharmacy Consult for Heparin  Indication: chest pain/ACS  Allergies  Allergen Reactions   Spironolactone Other (See Comments)    Other reaction(s): Unknown Increased creatinine Increased creatinine    Patient Measurements: Height: 5\' 8"  (172.7 cm) Weight: 127 kg (280 lb) IBW/kg (Calculated) : 68.4 Heparin Dosing Weight: 98 kg   Vital Signs: BP: 158/71 (10/28 0355) Pulse Rate: 92 (10/28 0355)  Labs: Recent Labs    12/11/20 2026 12/11/20 2229 12/12/20 0612  HGB 16.1  --   --   HCT 43.3  --   --   PLT 220  --   --   APTT  --  31  --   LABPROT  --  13.3  --   INR  --  1.0  --   HEPARINUNFRC  --   --  0.36  CREATININE 1.21  --   --   TROPONINIHS 2,698* 3,512*  --      Estimated Creatinine Clearance: 80.1 mL/min (by C-G formula based on SCr of 1.21 mg/dL).   Medical History: Past Medical History:  Diagnosis Date   Acute MI, lateral wall, initial episode of care (HCC) 03/23/2014   CAD (coronary artery disease)    a. s/p Lat MI 2/16 >> s/p CABG; b. LHC 1/17: mLAD 100, oD1 85%, oLCx 100, OM1 filled by collats from RPLB2, RPDA stent 95% ISR, L-LAD patent, S-OM1 occluded, S-D1 occluded >> PCI: Promus DES to RPDA  - residual CAD not amenable to PCI (dist vessel/diabetic appearance)    Carotid stenosis    a. Carotid 2/17 2/19 bilat ICA 1-39%   Chronic combined systolic and diastolic CHF (congestive heart failure) (HCC)    Hyperlipidemia 10/16/2014   Hypertension    Ischemic cardiomyopathy    a. Echo at [facility] 3/16:  EF 35-40%, diff HK, mild LVH, mild LAE, mild TR;  b. Echo 7/16: mild LVH, EF 50%, inf and lat HK-AK, Gr 2 DD, mod LAE, mild RVE, reduced RVSF, trivial TR, PASP 30 mmHg // c. Echo 5/17: moderate LVH, EF 45-50%, anteroseptal HK, grade 1 diastolic dysfunction, aortic sclerosis    Type 2 diabetes mellitus (HCC) 03/23/2014    Medications:  (Not in a hospital admission)  Assessment: Pharmacy consulted to dose heparin  in this 64 year old male admitted with ACS/NSTEMI.  No prior anticoag noted.  CrCl = 80.1 ml/min   Goal of Therapy:  Heparin level 0.3-0.7 units/ml Monitor platelets by anticoagulation protocol: Yes   Plan:  10/28:  HL @ 0612 = 0.36, therapeutic X 1 Will continue pt on current rate and recheck HL on 10/28 @ 1200.   Danny [name] D 12/12/2020,6:54 AM

## 2020-12-12 NOTE — ED Notes (Signed)
Pt changed from home clothes, into gown. Socks placed on patient. Pt assisted to urinal. Pt assisted back to stretcher. Pt signed and agreed to insulin pump contract. Paper left at bedside for patient use. Pt denies any needs at this time.

## 2020-12-12 NOTE — ED Notes (Addendum)
Pt titrated to 6L [place] and reminded to breathe in through nose.

## 2020-12-12 NOTE — Progress Notes (Signed)
Inpatient Diabetes Program Recommendations  AACE/ADA: New Consensus Statement on Inpatient Glycemic Control   Target Ranges:  Prepandial:   less than 140 mg/dL      Peak postprandial:   less than 180 mg/dL (1-2 hours)      Critically ill patients:  140 - 180 mg/dL   Results for Danny [name], Danny [name] (MRN [id number]) as of 12/12/2020 11:17  Ref. Range 12/11/2020 20:26 12/12/2020 06:12 12/12/2020 08:31  Glucose Latest Ref Range: 70 - 99 mg/dL 967 (H) 893 (H) 810 (H)   Review of Glycemic Control  Diabetes history: DM Outpatient Diabetes medications: T-Slim insulin pump with Humalog U200 and Dexcom CGM Current orders for Inpatient glycemic control: None  Inpatient Diabetes Program Recommendations:    Insulin Pump: Please use Insulin Pump order set to order Insulin Pump and CBGs AC&HS and 2am. Verbal order from Dr. Joseph Art to place orders or insulin pump as requested.  NOTE: Noted in chart that patient has DM hx and uses an insulin pump for DM control. Per chart, patient sees Dr. Gershon Crane (Endocrinologist) and was last seen on 10/17/20 and the following should be insulin pump settings: Basal rates Midnight = 3.0 12 PM = 3.0 TDD basal: 72.0 units   Bolus settings I/C: 10 ISF: 28 Target Glucose: 150 Active insulin time: 4 hours  Spoke with patient over the phone and he states he currently has his insulin pump on and using for DM control. Patient states he has a T-Slim insulin pump with Humalog insulin and also uses a Dexcom CGM. Patient states he is currently using Tandem IQ (auto mode) for DM control. Patient reports basal rate 3 units/hour, I:10 grams (1 unit for 10 grams of carbs), I:30 mg/dl (1 unit for every 30 mg/dl above target glucose), and target glucose 110 mg/dl. Patient reports that he does not have extra pump supplies at bedside. Asked that he have family bring extra pump supplies to the hospital so he has on hand and also be sure to bring the charger for his insulin pump. Patient  verbalized understanding of information discussed and states that he has no questions at this time related to DM. Sent communication to Dr. Joseph Art and Amil Amen, RN regarding request for insulin pump orders.  NURSING: Once insulin pump order set is ordered please print off the Patient insulin pump contract and flow sheet. The insulin pump contract should be signed by the patient and then placed in the chart. The patient insulin pump flow sheet will be completed by the patient at the bedside and the RN caring for the patient will use the patient's flow sheet to document in the [facility]. RN will need to complete the Nursing Insulin Pump Flowsheet at least once a shift. Patient will need to keep extra insulin pump supplies at the bedside at all times.   Thanks, Orlando Penner, RN, MSN, CDE Diabetes Coordinator Inpatient Diabetes Program [phone] (Team Pager from 8am to 5pm)

## 2020-12-12 NOTE — ED Notes (Signed)
MD verbal to stop fluids at this time due to pt cough and hypoxia. Pt titrated to 5L [place]

## 2020-12-12 NOTE — Progress Notes (Signed)
ANTICOAGULATION CONSULT NOTE - Initial Consult  Pharmacy Consult for Heparin  Indication: chest pain/ACS  Allergies  Allergen Reactions   Spironolactone Other (See Comments)    Other reaction(s): Unknown Increased creatinine Increased creatinine    Patient Measurements: Height: 5\' 8"  (172.7 cm) Weight: 127 kg (280 lb) IBW/kg (Calculated) : 68.4 Heparin Dosing Weight: 98 kg   Vital Signs: Temp: 99.2 F (37.3 C) (10/28 0738) Temp Source: Oral (10/28 0738) BP: 152/71 (10/28 1238) Pulse Rate: 93 (10/28 1238)  Labs: Recent Labs    12/11/20 2026 12/11/20 2229 12/12/20 0612 12/12/20 0831 12/12/20 1213  HGB 16.1  --  16.1 17.0  --   HCT 43.3  --  RESULTS UNAVAILABLE DUE TO INTERFERING SUBSTANCE 45.3  --   PLT 220  --  237 244  --   APTT  --  31  --   --   --   LABPROT  --  13.3  --   --   --   INR  --  1.0  --   --   --   HEPARINUNFRC  --   --  0.36  --  0.24*  CREATININE 1.21  --  1.36* 1.17  --   TROPONINIHS 2,698* 3,512*  --  4,898*  --      Estimated Creatinine Clearance: 82.8 mL/min (by C-G formula based on SCr of 1.17 mg/dL).   Medical History: Past Medical History:  Diagnosis Date   Acute MI, lateral wall, initial episode of care (HCC) 03/23/2014   CAD (coronary artery disease)    a. s/p Lat MI 2/16 >> s/p CABG; b. LHC 1/17: mLAD 100, oD1 85%, oLCx 100, OM1 filled by collats from RPLB2, RPDA stent 95% ISR, L-LAD patent, S-OM1 occluded, S-D1 occluded >> PCI: Promus DES to RPDA  - residual CAD not amenable to PCI (dist vessel/diabetic appearance)    Carotid stenosis    a. Carotid 2/17 2/19 bilat ICA 1-39%   Chronic combined systolic and diastolic CHF (congestive heart failure) (HCC)    Hyperlipidemia 10/16/2014   Hypertension    Ischemic cardiomyopathy    a. Echo at [facility] 3/16:  EF 35-40%, diff HK, mild LVH, mild LAE, mild TR;  b. Echo 7/16: mild LVH, EF 50%, inf and lat HK-AK, Gr 2 DD, mod LAE, mild RVE, reduced RVSF, trivial TR, PASP 30 mmHg // c. Echo 5/17:  moderate LVH, EF 45-50%, anteroseptal HK, grade 1 diastolic dysfunction, aortic sclerosis    Type 2 diabetes mellitus (HCC) 03/23/2014    Medications:  (Not in a hospital admission)  Assessment: Pharmacy consulted to dose heparin in this 65 year old male admitted with ACS/NSTEMI.  No prior anticoag noted.  CrCl = 80.1 ml/min   Date Time HL Rate/Comment  10/28 0612 0.36 Therapeutic @ 1400 units/hr 10/28 1213 0.24 Subthera, 1400 > 1600 units/hr  Goal of Therapy:  Heparin level 0.3-0.7 units/ml Monitor platelets by anticoagulation protocol: Yes   Plan:  Heparin level subtherapeutic Will order heparin bolus 1450 units x 1 Increase heparin infusion to 1600 units/hr Recheck HL 6 hours following rate change  CBC daily  11/28, PharmD, BCPS Clinical Pharmacist   12/12/2020,12:46 PM

## 2020-12-12 NOTE — ED Notes (Signed)
Per MD will call for STAT echo and will hold diet order

## 2020-12-12 NOTE — ED Notes (Signed)
Pt to cath lab with RN x2.

## 2020-12-12 NOTE — Consult Note (Signed)
   Cardiology Consultation:   Patient ID: Danny [name]; [id number]; 09/16/1955   Admit date: 12/11/2020 Date of Consult: 12/12/2020  Primary Care Provider: Leach, Shannon, NP Primary Cardiologist: Cooper Primary Electrophysiologist:  None   Patient Profile:   Danny [name] is a 65 y.o. male with a hx of CAD with lateral MI s/p CABG in 2016 with subsequent PCI, chronic combined systolic and diastolic CHF secondary to ICM with improved LV systolic function, DM2, CKD stage III, HTN, HLD, and carotid artery disease who is being seen today for the evaluation of NSTEMI at the request of Dr. Woods.  History of Present Illness:   Danny [name] was admitted with a lateral MI in 2016 with subsequent 3-vessel CABG with LIMA to LAD, SVG to OM and SVG diagonal. Repeat cath in 2017 showed occluded vein grafts with a patient LIMA to LAD. He underwent PCI to the rPDA. Most recent cath from 04/2019 showed severe 2-vessel CAD with occlusion of the LCx and LAD with patent RCA and continued patency of the PDA stent, along with known occlusion of the vein grafts. The LIMA to LAD was patent. Medical therapy was advised. He underwent echo in 08/2020, in the setting of worsening dyspnea and edema, which showed an EF of > 55%, mildly dilated internal cavity size, Gr2DD, normal RV systolic function and ventricular cavity size, and no significant valvular abnormalities.   He presented to [facility] on 10/27 with complaints of cough, SOB, orthopnea, and lower extremity swelling. He was recently treated for URI symptoms at an outside clinic with an antibiotic and cough medication. He indicated his wife had been sick with a sinus infection. He did not note improvement with this therapy and felt like he may have been having a reaction to the antibiotic with noted nausea, vomiting, and generalized weakness. In the setting of the above, he has noted lower extremity swelling and orthopnea. No chest pain. Symptoms do  not feel similar to his prior angina. Afebrile. EKG showed NSR, 84 bpm, right axis deviation, nonspecific IVCD, first degree AV block, poor R wave progression along the precordial leads, and nonspecific st/t changes. He was found to have an initial HS-Tn of 2698 with a delta HS-Tn of 3512, currently trended to 4898. Potassium 3.1 trending to 2.7, sodium 120 trending to 123, SCr 1.21, ASt 63, ALT 56, WBC 11.5, HGB 16.1, LDL 38, magnesium 2.3. Covid negative. CXR showed no acute process. CTA chest was negative for PE or acute infiltrate, mild cardiomegaly with marked coronary artery calcification, small hiatal hernia, and a fatty liver. In the ED, he was placed on a heparin gtt and has received ASA 324 mg, Maalox, Zofran, Tessalon, and cough syrup along with potassium supplementation. Currently, without chest pain. He continues to note a cough, SOB, and orthopnea along with lower extremity swelling.     Past Medical History:  Diagnosis Date   Acute MI, lateral wall, initial episode of care (HCC) 03/23/2014   CAD (coronary artery disease)    a. s/p Lat MI 2/16 >> s/p CABG; b. LHC 1/17: mLAD 100, oD1 85%, oLCx 100, OM1 filled by collats from RPLB2, RPDA stent 95% ISR, L-LAD patent, S-OM1 occluded, S-D1 occluded >> PCI: Promus DES to RPDA  - residual CAD not amenable to PCI (dist vessel/diabetic appearance)    Carotid stenosis    a. Carotid US 2/19 bilat ICA 1-39%   Chronic combined systolic and diastolic CHF (congestive heart failure) (HCC)    Hyperlipidemia 10/16/2014     Hypertension    Ischemic cardiomyopathy    a. Echo at [facility] 3/16:  EF 35-40%, diff HK, mild LVH, mild LAE, mild TR;  b. Echo 7/16: mild LVH, EF 50%, inf and lat HK-AK, Gr 2 DD, mod LAE, mild RVE, reduced RVSF, trivial TR, PASP 30 mmHg // c. Echo 5/17: moderate LVH, EF 45-50%, anteroseptal HK, grade 1 diastolic dysfunction, aortic sclerosis    Type 2 diabetes mellitus (HCC) 03/23/2014    Past Surgical History:  Procedure Laterality  Date   CARDIAC CATHETERIZATION N/A 02/24/2015   Procedure: Left Heart Cath and Cors/Grafts Angiography;  Surgeon: Michael Cooper, MD;  Location: [facility];  Service: Cardiovascular;  Laterality: N/A;   CARDIAC CATHETERIZATION N/A 02/24/2015   Procedure: Coronary Stent Intervention;  Surgeon: Michael Cooper, MD;  Location: [facility];  Service: Cardiovascular;  Laterality: N/A;   CORONARY ARTERY BYPASS GRAFT N/A 03/28/2014   Procedure: CORONARY ARTERY BYPASS GRAFTING (CABG);  Surgeon: Steven C Hendrickson, MD;  Location: [facility] OR;  Service: Open Heart Surgery;  Laterality: N/A;   LEFT HEART CATH AND CORS/GRAFTS ANGIOGRAPHY N/A 05/07/2019   Procedure: LEFT HEART CATH AND CORS/GRAFTS ANGIOGRAPHY;  Surgeon: Cooper, Michael, MD;  Location: [facility];  Service: Cardiovascular;  Laterality: N/A;   LEFT HEART CATHETERIZATION WITH CORONARY ANGIOGRAM N/A 03/23/2014   Procedure: LEFT HEART CATHETERIZATION WITH CORONARY ANGIOGRAM;  Surgeon: Michael D Cooper, MD;  Location: [facility] CATH [facility];  Service: Cardiovascular;  Laterality: N/A;   PERCUTANEOUS CORONARY STENT INTERVENTION (PCI-S)  02/24/2015   PDA   TEE WITHOUT CARDIOVERSION N/A 03/28/2014   Procedure: TRANSESOPHAGEAL ECHOCARDIOGRAM (TEE);  Surgeon: Steven C Hendrickson, MD;  Location: [facility] OR;  Service: Open Heart Surgery;  Laterality: N/A;     Home Meds: Prior to Admission medications   Medication Sig Start Date End Date Taking? Authorizing Provider  aspirin EC 81 MG tablet Take 1 tablet (81 mg total) by mouth daily. 04/30/19  Yes Cooper, Michael, MD  clopidogrel (PLAVIX) 75 MG tablet TAKE 1 TABLET BY MOUTH DAILY. PLEASE KEEP UPCOMING APPT IN JUNE 2022 BEFORE ANYMORE REFILLS. 08/25/20  Yes Cooper, Michael, MD  furosemide (LASIX) 40 MG tablet TAKE 2 TABLETS BY MOUTH IN THE MORNING AND 1 TABLET IN THE AFTERNOON. 11/03/20  Yes Walker, Caitlin S, NP  Glycerin-Hypromellose-PEG 400 ([employer] DRY EYE RELIEF) 0.2-0.2-1 % SOLN Place 1 drop into both eyes  daily as needed (dry eyes).   Yes [provider]  HUMALOG KWIKPEN 200 UNIT/ML KwikPen Inject 206 Units into the skin as directed. (Administered via insulin pump)   Yes [provider]  ipratropium (ATROVENT) 0.03 % nasal spray Place 2 sprays into both nostrils daily as needed for rhinitis.   Yes [provider]  isosorbide mononitrate (IMDUR) 30 MG 24 hr tablet Take 1 tablet (30 mg total) by mouth daily. 08/08/20  Yes Walker, Caitlin S, NP  metolazone (ZAROXOLYN) 2.5 MG tablet Take 2.5 mg by mouth every other day.   Yes [provider]  metoprolol succinate (TOPROL-XL) 100 MG 24 hr tablet TAKE 1 TABLET BY MOUTH DAILY. TAKE WITH OR IMMEDIATELY FOLLOWING A MEAL. 09/25/20  Yes Cooper, Michael, MD  nitroGLYCERIN (NITROSTAT) 0.4 MG SL tablet Place 1 tablet (0.4 mg total) under the tongue every 5 (five) minutes as needed for chest pain. 04/27/19  Yes Cooper, Michael, MD  Olmesartan-amLODIPine-HCTZ 40-5-25 MG TABS Take 1 tablet by mouth daily.   Yes [provider]  pantoprazole (PROTONIX) 40 MG tablet Take 40 mg   by mouth daily. 10/18/20  Yes [provider]  potassium chloride (MICRO-K) 10 MEQ CR capsule Take 20 mEq by mouth every other day. (Take on same day as metolazone)   Yes [provider]  rosuvastatin (CRESTOR) 40 MG tablet TAKE 1 TABLET BY MOUTH EVERY DAY 05/19/20  Yes Weaver, Scott T, PA-C  esomeprazole (NEXIUM) 40 MG capsule Take 1 capsule (40 mg total) by mouth daily at 12 noon. Patient not taking: Reported on 12/12/2020 05/02/19   Cooper, Michael, MD    Inpatient Medications: Scheduled Meds:  amLODipine  10 mg Oral Daily   [START ON 12/13/2020] aspirin EC  81 mg Oral Daily   clopidogrel  75 mg Oral Daily   isosorbide mononitrate  30 mg Oral Daily   metoprolol succinate  100 mg Oral Daily   pantoprazole  40 mg Oral Daily   rosuvastatin  40 mg Oral Daily   Continuous Infusions:  heparin 1,400 Units/hr (12/11/20 2247)   potassium  chloride 10 mEq (12/12/20 0834)   PRN Meds: acetaminophen, alum & mag hydroxide-simeth, guaiFENesin-codeine, nitroGLYCERIN, ondansetron (ZOFRAN) IV  Allergies:   Allergies  Allergen Reactions   Spironolactone Other (See Comments)    Other reaction(s): Unknown Increased creatinine Increased creatinine    Social History:   Social History   Socioeconomic History   Marital status: Single    Spouse name: Not on file   Number of children: Not on file   Years of education: Not on file   Highest education level: Not on file  Occupational History   Not on file  Tobacco Use   Smoking status: Never   Smokeless tobacco: Never  Vaping Use   Vaping Use: Never used  Substance and Sexual Activity   Alcohol use: Yes   Drug use: No   Sexual activity: Not on file  Other Topics Concern   Not on file  Social History Narrative   The patient is married. He has grown children. He is a lifelong nonsmoker. He works in maintenance.   Social Determinants of Health   Financial Resource Strain: Not on file  Food Insecurity: Not on file  Transportation Needs: Not on file  Physical Activity: Not on file  Stress: Not on file  Social Connections: Not on file  Intimate Partner Violence: Not on file     Family History:   Family History  Problem Relation Age of Onset   Healthy Mother        No history of CAD   Healthy Father        No history of CAD   Heart attack Father    Diabetes Brother    Stroke Neg Hx     ROS:  Review of Systems  Constitutional:  Positive for malaise/fatigue. Negative for chills, diaphoresis, fever and weight loss.  HENT:  Positive for congestion.   Eyes:  Negative for discharge and redness.  Respiratory:  Positive for cough, sputum production and shortness of breath. Negative for wheezing.   Cardiovascular:  Positive for orthopnea and leg swelling. Negative for chest pain, palpitations, claudication and PND.  Gastrointestinal:  Negative for abdominal pain,  heartburn, nausea and vomiting.  Musculoskeletal:  Negative for falls and myalgias.  Skin:  Negative for rash.  Neurological:  Positive for weakness. Negative for dizziness, tingling, tremors, sensory change, speech change, focal weakness and loss of consciousness.  Endo/Heme/Allergies:  Does not bruise/bleed easily.  Psychiatric/Behavioral:  Negative for substance abuse. The patient is not nervous/anxious.   All other systems   reviewed and are negative.    Physical Exam/Data:   Vitals:   12/12/20 0700 12/12/20 0730 12/12/20 0738 12/12/20 0800  BP: (!) 155/70 138/73 138/73 131/67  Pulse: 91 82 82 88  Resp: 20 18 18 13  Temp:   99.2 F (37.3 C)   TempSrc:   Oral   SpO2: 91% 92% 92% 92%  Weight:      Height:        Intake/Output Summary (Last 24 hours) at 12/12/2020 0915 Last data filed at 12/11/2020 2127 Gross per 24 hour  Intake 499.5 ml  Output --  Net 499.5 ml   Filed Weights   12/11/20 1732  Weight: 127 kg   Body mass index is 42.57 kg/m.   Physical Exam: General: Well developed, well nourished, in no acute distress. Head: Normocephalic, atraumatic, sclera non-icteric, no xanthomas, nares without discharge.  Neck: Negative for carotid bruits. JVD not elevated. Lungs: Diminished, coarse breath sounds bilaterally with diffuse wheezing. Breathing is unlabored. On supplemental oxygen.  Heart: RRR with S1 S2. No murmurs, rubs, or gallops appreciated. Abdomen: Soft, non-tender, mildly distended with normoactive bowel sounds. No hepatomegaly. No rebound/guarding. No obvious abdominal masses. Msk:  Strength and tone appear normal for age. Extremities: No clubbing or cyanosis. Trace bilateral pretibial edema. Distal pedal pulses are 2+ and equal bilaterally. Neuro: Alert and oriented X 3. No facial asymmetry. No focal deficit. Moves all extremities spontaneously. Psych:  Responds to questions appropriately with a normal affect.   EKG:  The EKG was personally reviewed and  demonstrates: NSR, 84 bpm, right axis deviation, nonspecific IVCD, first degree AV block, poor R wave progression along the precordial leads, nonspecific st/t changes Telemetry:  Telemetry was personally reviewed and demonstrates: SR  Weights: Filed Weights   12/11/20 1732  Weight: 127 kg    Relevant CV Studies:  2D echo 09/08/2020: 1. Left ventricular ejection fraction, by estimation, is >55%. The left  ventricle has low normal function. Left ventricular endocardial border not  optimally defined to evaluate regional wall motion. The left ventricular  internal cavity size was mildly  dilated. Left ventricular diastolic parameters are consistent with Grade  II diastolic dysfunction (pseudonormalization).   2. Right ventricular systolic function is normal. The right ventricular  size is normal.   3. The mitral valve is normal in structure. No evidence of mitral valve  regurgitation. No evidence of mitral stenosis.   4. The aortic valve is tricuspid. Aortic valve regurgitation is not  visualized. No aortic stenosis is present.   Comparison(s): Previous Echo showed LV EF 50-55%, inferior wall  hypokinesis, grade I diastolic dysfunction, MAC.  __________  LHC 05/07/2019: 1. Severe 2 vessel CAD with occlusion of the left circumflex and LAD 2. Patent RCA with continued patency of the PDA stent 3. S/P CABG with known occlusion of all saphenous vein grafts and continued patency of the LIMA-LAD   Recommend: continue medical therapy/lifestyle modification __________  Laboratory Data:  Chemistry Recent Labs  Lab 12/11/20 2026 12/12/20 0612  NA 120* 124*  K 3.1* 2.7*  CL 78* 82*  CO2 27 31  GLUCOSE 358* 114*  BUN 19 19  CREATININE 1.21 1.36*  CALCIUM 9.0 8.8*  GFRNONAA >60 58*  ANIONGAP 15 11    Recent Labs  Lab 12/11/20 2026  PROT 7.9  ALBUMIN 4.3  AST 63*  ALT 56*  ALKPHOS 47  BILITOT 1.7*   Hematology Recent Labs  Lab 12/11/20 2026 12/12/20 0612  WBC 11.5*  11.6*    RBC 5.39 RESULTS UNAVAILABLE DUE TO INTERFERING SUBSTANCE  HGB 16.1 16.1  HCT 43.3 RESULTS UNAVAILABLE DUE TO INTERFERING SUBSTANCE  MCV 80.3 RESULTS UNAVAILABLE DUE TO INTERFERING SUBSTANCE  MCH 29.9 RESULTS UNAVAILABLE DUE TO INTERFERING SUBSTANCE  MCHC 37.2* RESULTS UNAVAILABLE DUE TO INTERFERING SUBSTANCE  RDW 12.1 RESULTS UNAVAILABLE DUE TO INTERFERING SUBSTANCE  PLT 220 237   Cardiac EnzymesNo results for input(s): TROPONINI in the last 168 hours. No results for input(s): TROPIPOC in the last 168 hours.  BNP Recent Labs  Lab 12/09/20 1213  BNP 82.7    DDimer No results for input(s): DDIMER in the last 168 hours.  Radiology/Studies:  DG Chest 2 View  Result Date: 12/11/2020 IMPRESSION: No active cardiopulmonary disease. Electronically Signed   By: Kevin  Dover M.D.   On: 12/11/2020 18:32   CT Angio Chest Pulmonary Embolism (PE) W or WO Contrast  Result Date: 12/12/2020 IMPRESSION: 1. No evidence of pulmonary embolism or acute infiltrate. 2. Mild cardiomegaly with marked severity coronary artery calcification. 3. Small hiatal hernia. 4. Fatty liver. Electronically Signed   By: Thaddeus  Houston M.D.   On: 12/12/2020 01:37    Assessment and Plan:   1. CAD s/p CABG with subsequent PCI with NSTEMI: -Currently, without chest pain -Symptoms are concerning for pulmonary etiology, though he does have a significant troponin elevation -Plan for LHC today -NPO -ASA -Plavix  -Imdur -Heparin gtt -Trend troponin to peak -Echo -Risks and benefits of cardiac catheterization have been discussed with the patient including risks of bleeding, bruising, infection, kidney damage, stroke, heart attack, and death. The patient understands these risks and is willing to proceed with the procedure. All questions have been answered and concerns listened to  2. HFrEF secondary to ICM: -Subsequent normalization of LVSF -He does appear to be volume up with noted lower extremity swelling  and orthopnea -Obtain LVEDP during cath to assist with diuresis  -Echo -Toprol XL  3. HTN: -Blood pressure reasonably controlled -Amlodipine, Toprol, Imdur  4. HLD:  -LDL 38 -Crestor   5. Renal dysfunction: -Monitor   6. Hypokalemia: -Replete to goal 4.0   Remaining noncardiac issues/abnormalities possibly in the setting of vomiting and deferred to the per primary service    For questions or updates, please contact [facility] Please consult [email or web address] for contact info under Cardiology/STEMI.   Signed, Kameran Mcneese, PA-C [facility] Pager: [phone] 12/12/2020, 9:15 AM    

## 2020-12-12 NOTE — H&P (View-Only) (Signed)
Cardiology Consultation:   Patient ID: Danny [name]; [id number]; [birth year]   Admit date: 12/11/2020 Date of Consult: 12/12/2020  Primary Care Provider: Toni Arthurs, NP Primary Cardiologist: Burt Knack Primary Electrophysiologist:  None   Patient Profile:   Danny [name] is a 65 y.o. male with a hx of CAD with lateral MI s/p CABG in 2016 with subsequent PCI, chronic combined systolic and diastolic CHF secondary to ICM with improved LV systolic function, DM2, CKD stage III, HTN, HLD, and carotid artery disease who is being seen today for the evaluation of NSTEMI at the request of Dr. Sherral Hammers.  History of Present Illness:   Danny [name] was admitted with a lateral MI in 2016 with subsequent 3-vessel CABG with LIMA to LAD, SVG to OM and SVG diagonal. Repeat cath in 2017 showed occluded vein grafts with a patient LIMA to LAD. He underwent PCI to the rPDA. Most recent cath from 04/2019 showed severe 2-vessel CAD with occlusion of the LCx and LAD with patent RCA and continued patency of the PDA stent, along with known occlusion of the vein grafts. The LIMA to LAD was patent. Medical therapy was advised. He underwent echo in 08/2020, in the setting of worsening dyspnea and edema, which showed an EF of > 55%, mildly dilated internal cavity size, Gr2DD, normal RV systolic function and ventricular cavity size, and no significant valvular abnormalities.   He presented to [facility] on 10/27 with complaints of cough, SOB, orthopnea, and lower extremity swelling. He was recently treated for URI symptoms at an outside clinic with an antibiotic and cough medication. He indicated his wife had been sick with a sinus infection. He did not note improvement with this therapy and felt like he may have been having a reaction to the antibiotic with noted nausea, vomiting, and generalized weakness. In the setting of the above, he has noted lower extremity swelling and orthopnea. No chest pain. Symptoms do  not feel similar to his prior angina. Afebrile. EKG showed NSR, 84 bpm, right axis deviation, nonspecific IVCD, first degree AV block, poor R wave progression along the precordial leads, and nonspecific st/t changes. He was found to have an initial HS-Tn of 2698 with a delta HS-Tn of 3512, currently trended to 4898. Potassium 3.1 trending to 2.7, sodium 120 trending to 123, SCr 1.21, ASt 63, ALT 56, WBC 11.5, HGB 16.1, LDL 38, magnesium 2.3. Covid negative. CXR showed no acute process. CTA chest was negative for PE or acute infiltrate, mild cardiomegaly with marked coronary artery calcification, small hiatal hernia, and a fatty liver. In the ED, he was placed on a heparin gtt and has received ASA 324 mg, Maalox, Zofran, Tessalon, and cough syrup along with potassium supplementation. Currently, without chest pain. He continues to note a cough, SOB, and orthopnea along with lower extremity swelling.     Past Medical History:  Diagnosis Date   Acute MI, lateral wall, initial episode of care ([place]) 03/23/2014   CAD (coronary artery disease)    a. s/p Lat MI 2/16 >> s/p CABG; b. LHC 1/17: mLAD 100, oD1 85%, oLCx 100, OM1 filled by collats from RPLB2, RPDA stent 95% ISR, L-LAD patent, S-OM1 occluded, S-D1 occluded >> PCI: Promus DES to RPDA  - residual CAD not amenable to PCI (dist vessel/diabetic appearance)    Carotid stenosis    a. Carotid US 2/19 bilat ICA 1-39%   Chronic combined systolic and diastolic CHF (congestive heart failure) ([place])    Hyperlipidemia 10/16/2014  Hypertension    Ischemic cardiomyopathy    a. Echo at [facility] 3/16:  EF 35-40%, diff HK, mild LVH, mild LAE, mild TR;  b. Echo 7/16: mild LVH, EF 50%, inf and lat HK-AK, Gr 2 DD, mod LAE, mild RVE, reduced RVSF, trivial TR, PASP 30 mmHg // c. Echo 5/17: moderate LVH, EF 45-50%, anteroseptal HK, grade 1 diastolic dysfunction, aortic sclerosis    Type 2 diabetes mellitus ([place]) 03/23/2014    Past Surgical History:  Procedure Laterality  Date   CARDIAC CATHETERIZATION N/A 02/24/2015   Procedure: Left Heart Cath and Cors/Grafts Angiography;  Surgeon: Sherren Mocha, MD;  Location: [place] CV [facility];  Service: Cardiovascular;  Laterality: N/A;   CARDIAC CATHETERIZATION N/A 02/24/2015   Procedure: Coronary Stent Intervention;  Surgeon: Sherren Mocha, MD;  Location: [place] [facility];  Service: Cardiovascular;  Laterality: N/A;   CORONARY ARTERY BYPASS GRAFT N/A 03/28/2014   Procedure: CORONARY ARTERY BYPASS GRAFTING (CABG);  Surgeon: Melrose Nakayama, MD;  Location: [place];  Service: Open Heart Surgery;  Laterality: N/A;   LEFT HEART CATH AND CORS/GRAFTS ANGIOGRAPHY N/A 05/07/2019   Procedure: LEFT HEART CATH AND CORS/GRAFTS ANGIOGRAPHY;  Surgeon: Sherren Mocha, MD;  Location: [place] [facility];  Service: Cardiovascular;  Laterality: N/A;   LEFT HEART CATHETERIZATION WITH CORONARY ANGIOGRAM N/A 03/23/2014   Procedure: LEFT HEART CATHETERIZATION WITH CORONARY ANGIOGRAM;  Surgeon: Blane Ohara, MD;  Location: [facility] CATH LAB;  Service: Cardiovascular;  Laterality: N/A;   PERCUTANEOUS CORONARY STENT INTERVENTION (PCI-S)  02/24/2015   PDA   TEE WITHOUT CARDIOVERSION N/A 03/28/2014   Procedure: TRANSESOPHAGEAL ECHOCARDIOGRAM (TEE);  Surgeon: Melrose Nakayama, MD;  Location: [place];  Service: Open Heart Surgery;  Laterality: N/A;     Home Meds: Prior to Admission medications   Medication Sig Start Date End Date Taking? Authorizing Provider  aspirin EC 81 MG tablet Take 1 tablet (81 mg total) by mouth daily. 04/30/19  Yes Sherren Mocha, MD  clopidogrel (PLAVIX) 75 MG tablet TAKE 1 TABLET BY MOUTH DAILY. PLEASE KEEP UPCOMING APPT IN JUNE 2022 BEFORE ANYMORE REFILLS. 08/25/20  Yes Sherren Mocha, MD  furosemide (LASIX) 40 MG tablet TAKE 2 TABLETS BY MOUTH IN THE MORNING AND 1 TABLET IN THE AFTERNOON. 11/03/20  Yes Loel Dubonnet, NP  Glycerin-Hypromellose-PEG 400 ([employer] DRY EYE RELIEF) 0.2-0.2-1 % SOLN Place 1 drop into both eyes  daily as needed (dry eyes).   Yes [provider]  HUMALOG KWIKPEN 200 UNIT/ML KwikPen Inject 206 Units into the skin as directed. (Administered via insulin pump)   Yes [provider]  ipratropium (ATROVENT) 0.03 % nasal spray Place 2 sprays into both nostrils daily as needed for rhinitis.   Yes [provider]  isosorbide mononitrate (IMDUR) 30 MG 24 hr tablet Take 1 tablet (30 mg total) by mouth daily. 08/08/20  Yes Loel Dubonnet, NP  metolazone (ZAROXOLYN) 2.5 MG tablet Take 2.5 mg by mouth every other day.   Yes [provider]  metoprolol succinate (TOPROL-XL) 100 MG 24 hr tablet TAKE 1 TABLET BY MOUTH DAILY. TAKE WITH OR IMMEDIATELY FOLLOWING A MEAL. 09/25/20  Yes Sherren Mocha, MD  nitroGLYCERIN (NITROSTAT) 0.4 MG SL tablet Place 1 tablet (0.4 mg total) under the tongue every 5 (five) minutes as needed for chest pain. 04/27/19  Yes Sherren Mocha, MD  Olmesartan-amLODIPine-HCTZ 40-5-25 MG TABS Take 1 tablet by mouth daily.   Yes [provider]  pantoprazole (PROTONIX) 40 MG tablet Take 40 mg  by mouth daily. 10/18/20  Yes [provider]  potassium chloride (MICRO-K) 10 MEQ CR capsule Take 20 mEq by mouth every other day. (Take on same day as metolazone)   Yes [provider]  rosuvastatin (CRESTOR) 40 MG tablet TAKE 1 TABLET BY MOUTH EVERY DAY 05/19/20  Yes Kathlen Mody, Scott T, PA-C  esomeprazole (NEXIUM) 40 MG capsule Take 1 capsule (40 mg total) by mouth daily at 12 noon. Patient not taking: Reported on 12/12/2020 05/02/19   Sherren Mocha, MD    Inpatient Medications: Scheduled Meds:  amLODipine  10 mg Oral Daily   [START ON 12/13/2020] aspirin EC  81 mg Oral Daily   clopidogrel  75 mg Oral Daily   isosorbide mononitrate  30 mg Oral Daily   metoprolol succinate  100 mg Oral Daily   pantoprazole  40 mg Oral Daily   rosuvastatin  40 mg Oral Daily   Continuous Infusions:  heparin 1,400 Units/hr (12/11/20 2247)   potassium  chloride 10 mEq (12/12/20 0834)   PRN Meds: acetaminophen, alum & mag hydroxide-simeth, guaiFENesin-codeine, nitroGLYCERIN, ondansetron (ZOFRAN) IV  Allergies:   Allergies  Allergen Reactions   Spironolactone Other (See Comments)    Other reaction(s): Unknown Increased creatinine Increased creatinine    Social History:   Social History   Socioeconomic History   Marital status: Single    Spouse name: Not on file   Number of children: Not on file   Years of education: Not on file   Highest education level: Not on file  Occupational History   Not on file  Tobacco Use   Smoking status: Never   Smokeless tobacco: Never  Vaping Use   Vaping Use: Never used  Substance and Sexual Activity   Alcohol use: Yes   Drug use: No   Sexual activity: Not on file  Other Topics Concern   Not on file  Social History Narrative   The patient is married. He has grown children. He is a lifelong nonsmoker. He works in Theatre manager.   Social Determinants of Health   Financial Resource Strain: Not on file  Food Insecurity: Not on file  Transportation Needs: Not on file  Physical Activity: Not on file  Stress: Not on file  Social Connections: Not on file  Intimate Partner Violence: Not on file     Family History:   Family History  Problem Relation Age of Onset   Healthy Mother        No history of CAD   Healthy Father        No history of CAD   Heart attack Father    Diabetes Brother    Stroke Neg Hx     ROS:  Review of Systems  Constitutional:  Positive for malaise/fatigue. Negative for chills, diaphoresis, fever and weight loss.  HENT:  Positive for congestion.   Eyes:  Negative for discharge and redness.  Respiratory:  Positive for cough, sputum production and shortness of breath. Negative for wheezing.   Cardiovascular:  Positive for orthopnea and leg swelling. Negative for chest pain, palpitations, claudication and PND.  Gastrointestinal:  Negative for abdominal pain,  heartburn, nausea and vomiting.  Musculoskeletal:  Negative for falls and myalgias.  Skin:  Negative for rash.  Neurological:  Positive for weakness. Negative for dizziness, tingling, tremors, sensory change, speech change, focal weakness and loss of consciousness.  Endo/Heme/Allergies:  Does not bruise/bleed easily.  Psychiatric/Behavioral:  Negative for substance abuse. The patient is not nervous/anxious.   All other systems  reviewed and are negative.    Physical Exam/Data:   Vitals:   12/12/20 0700 12/12/20 0730 12/12/20 0738 12/12/20 0800  BP: (!) 155/70 138/73 138/73 131/67  Pulse: 91 82 82 88  Resp: _0 Temp:   99.2 F (37.3 C)   TempSrc:   Oral   SpO2: 91% 92% 92% 92%  Weight:      Height:        Intake/Output Summary (Last 24 hours) at 12/12/2020 0915 Last data filed at 12/11/2020 2127 Gross per 24 hour  Intake 499.5 ml  Output --  Net 499.5 ml   Filed Weights   12/11/20 1732  Weight: 127 kg   Body mass index is 42.57 kg/m.   Physical Exam: General: Well developed, well nourished, in no acute distress. Head: Normocephalic, atraumatic, sclera non-icteric, no xanthomas, nares without discharge.  Neck: Negative for carotid bruits. JVD not elevated. Lungs: Diminished, coarse breath sounds bilaterally with diffuse wheezing. Breathing is unlabored. On supplemental oxygen.  Heart: RRR with S1 S2. No murmurs, rubs, or gallops appreciated. Abdomen: Soft, non-tender, mildly distended with normoactive bowel sounds. No hepatomegaly. No rebound/guarding. No obvious abdominal masses. Msk:  Strength and tone appear normal for age. Extremities: No clubbing or cyanosis. Trace bilateral pretibial edema. Distal pedal pulses are 2+ and equal bilaterally. Neuro: Alert and oriented X 3. No facial asymmetry. No focal deficit. Moves all extremities spontaneously. Psych:  Responds to questions appropriately with a normal affect.   EKG:  The EKG was personally reviewed and  demonstrates: NSR, 84 bpm, right axis deviation, nonspecific IVCD, first degree AV block, poor R wave progression along the precordial leads, nonspecific st/t changes Telemetry:  Telemetry was personally reviewed and demonstrates: SR  Weights: [employer]   12/11/20 1732  Weight: 127 kg    Relevant CV Studies:  2D echo 09/08/2020: 1. Left ventricular ejection fraction, by estimation, is >55%. The left  ventricle has low normal function. Left ventricular endocardial border not  optimally defined to evaluate regional wall motion. The left ventricular  internal cavity size was mildly  dilated. Left ventricular diastolic parameters are consistent with Grade  II diastolic dysfunction (pseudonormalization).   2. Right ventricular systolic function is normal. The right ventricular  size is normal.   3. The mitral valve is normal in structure. No evidence of mitral valve  regurgitation. No evidence of mitral stenosis.   4. The aortic valve is tricuspid. Aortic valve regurgitation is not  visualized. No aortic stenosis is present.   Comparison(s): Previous Echo showed LV EF 50-55%, inferior wall  hypokinesis, grade I diastolic dysfunction, MAC.  __________  LHC 05/07/2019: 1. Severe 2 vessel CAD with occlusion of the left circumflex and LAD 2. Patent RCA with continued patency of the PDA stent 3. S/P CABG with known occlusion of all saphenous vein grafts and continued patency of the LIMA-LAD   Recommend: continue medical therapy/lifestyle modification __________  Laboratory Data:  Chemistry Recent Labs  Lab 12/11/20 2026 12/12/20 0612  NA 120* 124*  K 3.1* 2.7*  CL 78* 82*  CO2 27 31  GLUCOSE 358* 114*  BUN 19 19  CREATININE 1.21 1.36*  CALCIUM 9.0 8.8*  GFRNONAA >60 58*  ANIONGAP 15 11    Recent Labs  Lab 12/11/20 2026  PROT 7.9  ALBUMIN 4.3  AST 63*  ALT 56*  ALKPHOS 47  BILITOT 1.7*   Hematology Recent Labs  Lab 12/11/20 2026 12/12/20 0612  WBC 11.5*  11.6*  RBC 5.39 RESULTS UNAVAILABLE DUE TO INTERFERING SUBSTANCE  HGB 16.1 16.1  HCT 43.3 RESULTS UNAVAILABLE DUE TO INTERFERING SUBSTANCE  MCV 80.3 RESULTS UNAVAILABLE DUE TO INTERFERING SUBSTANCE  MCH 29.9 RESULTS UNAVAILABLE DUE TO INTERFERING SUBSTANCE  MCHC 37.2* RESULTS UNAVAILABLE DUE TO INTERFERING SUBSTANCE  RDW 12.1 RESULTS UNAVAILABLE DUE TO INTERFERING SUBSTANCE  PLT 220 237   Cardiac EnzymesNo results for input(s): TROPONINI in the last 168 hours. No results for input(s): TROPIPOC in the last 168 hours.  BNP Recent Labs  Lab 12/09/20 1213  BNP 82.7    DDimer No results for input(s): DDIMER in the last 168 hours.  Radiology/Studies:  DG Chest 2 View  Result Date: 12/11/2020 IMPRESSION: No active cardiopulmonary disease. Electronically Signed   By: Rolm Baptise M.D.   On: 12/11/2020 18:32   CT Angio Chest Pulmonary Embolism (PE) W or WO Contrast  Result Date: 12/12/2020 IMPRESSION: 1. No evidence of pulmonary embolism or acute infiltrate. 2. Mild cardiomegaly with marked severity coronary artery calcification. 3. Small hiatal hernia. 4. Fatty liver. Electronically Signed   By: Virgina Norfolk M.D.   On: 12/12/2020 01:37    Assessment and Plan:   1. CAD s/p CABG with subsequent PCI with NSTEMI: -Currently, without chest pain -Symptoms are concerning for pulmonary etiology, though he does have a significant troponin elevation -Plan for LHC today -NPO -ASA -Plavix  -Imdur -Heparin gtt -Trend troponin to peak -Echo -Risks and benefits of cardiac catheterization have been discussed with the patient including risks of bleeding, bruising, infection, kidney damage, stroke, heart attack, and death. The patient understands these risks and is willing to proceed with the procedure. All questions have been answered and concerns listened to  2. HFrEF secondary to ICM: -Subsequent normalization of LVSF -He does appear to be volume up with noted lower extremity swelling  and orthopnea -Obtain LVEDP during cath to assist with diuresis  -Echo -Toprol XL  3. HTN: -Blood pressure reasonably controlled -Amlodipine, Toprol, Imdur  4. HLD:  -LDL 38 -Crestor   5. Renal dysfunction: -Monitor   6. Hypokalemia: -Replete to goal 4.0   Remaining noncardiac issues/abnormalities possibly in the setting of vomiting and deferred to the per primary service    For questions or updates, please contact [place] Please consult [email or web address] for contact info under Cardiology/STEMI.   Signed, Christell Faith, PA-C [place] Pager: [phone] 12/12/2020, 9:15 AM

## 2020-12-12 NOTE — Interval H&P Note (Signed)
History and Physical Interval Note:  12/12/2020 5:44 PM  Danny [name]  has presented today for surgery, with the diagnosis of non ST segment myocardial infarction.  The various methods of treatment have been discussed with the patient and family. After consideration of risks, benefits and other options for treatment, the patient has consented to  Procedure(s): LEFT HEART CATH AND CORS/GRAFTS ANGIOGRAPHY (N/A) as a surgical intervention.  The patient's history has been reviewed, patient examined, no change in status, stable for surgery.  I have reviewed the patient's chart and labs.  Questions were answered to the patient's satisfaction.    Cath Lab Visit (complete for each Cath Lab visit)  Clinical Evaluation Leading to the Procedure:   ACS: Yes.    Non-ACS:  N/A  Teng Decou

## 2020-12-12 NOTE — ED Notes (Signed)
RN attempting to contact ECHO staff without success. Order changed to STAT per MD verbal

## 2020-12-12 NOTE — ED Notes (Signed)
Pt coughing then back to sleep.

## 2020-12-12 NOTE — ED Notes (Signed)
Pt experiencing very harsh barky cough MD aware.

## 2020-12-12 NOTE — ED Notes (Signed)
Pt noticed coughing in bed. NADN pt states he is okay

## 2020-12-12 NOTE — ED Notes (Signed)
Cardiology at bedside.

## 2020-12-12 NOTE — ED Notes (Signed)
Pt RA sats 89-90%. Pt placed on 2L [place] and repositioned in an upright position. MD made aware.

## 2020-12-12 NOTE — ED Notes (Signed)
Provider messaged for pt diet

## 2020-12-13 ENCOUNTER — Inpatient Hospital Stay (HOSPITAL_COMMUNITY)
Admit: 2020-12-13 | Discharge: 2020-12-13 | Disposition: A | Discharge status: Home / Self Care | Payer: Medicare Other | Attending: Internal Medicine | Admitting: Internal Medicine

## 2020-12-13 ENCOUNTER — Other Ambulatory Visit: Payer: Self-pay

## 2020-12-13 DIAGNOSIS — I5042 Chronic combined systolic (congestive) and diastolic (congestive) heart failure: Secondary | ICD-10-CM | POA: Diagnosis not present

## 2020-12-13 DIAGNOSIS — I214 Non-ST elevation (NSTEMI) myocardial infarction: Secondary | ICD-10-CM | POA: Diagnosis not present

## 2020-12-13 DIAGNOSIS — R051 Acute cough: Secondary | ICD-10-CM | POA: Diagnosis not present

## 2020-12-13 DIAGNOSIS — I10 Essential (primary) hypertension: Secondary | ICD-10-CM

## 2020-12-13 DIAGNOSIS — I248 Other forms of acute ischemic heart disease: Secondary | ICD-10-CM

## 2020-12-13 DIAGNOSIS — E7800 Pure hypercholesterolemia, unspecified: Secondary | ICD-10-CM

## 2020-12-13 DIAGNOSIS — I2510 Atherosclerotic heart disease of native coronary artery without angina pectoris: Secondary | ICD-10-CM | POA: Diagnosis not present

## 2020-12-13 DIAGNOSIS — R059 Cough, unspecified: Secondary | ICD-10-CM | POA: Diagnosis not present

## 2020-12-13 DIAGNOSIS — R0602 Shortness of breath: Secondary | ICD-10-CM | POA: Diagnosis not present

## 2020-12-13 DIAGNOSIS — J121 Respiratory syncytial virus pneumonia: Secondary | ICD-10-CM

## 2020-12-13 DIAGNOSIS — I255 Ischemic cardiomyopathy: Secondary | ICD-10-CM

## 2020-12-13 LAB — TROPONIN I (HIGH SENSITIVITY): Troponin I (High Sensitivity): 2230 ng/L (ref <18)

## 2020-12-13 LAB — HEMOGLOBIN A1C
Hgb A1c MFr Bld: 7.4 % — ABNORMAL HIGH (ref 4.8–5.6)
Hgb A1c MFr Bld: 7.2 % — ABNORMAL HIGH (ref 4.8–5.6)
Mean Plasma Glucose: 165.68 mg/dL
Mean Plasma Glucose: 159.94 mg/dL

## 2020-12-13 LAB — CBC WITH DIFFERENTIAL/PLATELET
Abs Immature Granulocytes: 0.09 10*3/uL — ABNORMAL HIGH (ref 0.00–0.07)
Basophils Absolute: 0.1 10*3/uL (ref 0.0–0.1)
Basophils Relative: 1 %
Eosinophils Absolute: 0.1 10*3/uL (ref 0.0–0.5)
Eosinophils Relative: 1 %
HCT: 42.4 % (ref 39.0–52.0)
Hemoglobin: 15.6 g/dL (ref 13.0–17.0)
Immature Granulocytes: 1 %
Lymphocytes Relative: 13 %
Lymphs Abs: 1.3 10*3/uL (ref 0.7–4.0)
MCH: 30.3 pg (ref 26.0–34.0)
MCHC: 36.8 g/dL — ABNORMAL HIGH (ref 30.0–36.0)
MCV: 82.3 fL (ref 80.0–100.0)
Monocytes Absolute: 1.2 10*3/uL — ABNORMAL HIGH (ref 0.1–1.0)
Monocytes Relative: 11 %
Neutro Abs: 7.7 10*3/uL (ref 1.7–7.7)
Neutrophils Relative %: 73 %
Platelets: 241 10*3/uL (ref 150–400)
RBC: 5.15 MIL/uL (ref 4.22–5.81)
RDW: 12.9 % (ref 11.5–15.5)
WBC: 10.5 10*3/uL (ref 4.0–10.5)
nRBC: 0 % (ref 0.0–0.2)

## 2020-12-13 LAB — COMPREHENSIVE METABOLIC PANEL
ALT: 41 U/L (ref 0–44)
AST: 54 U/L — ABNORMAL HIGH (ref 15–41)
Albumin: 4.2 g/dL (ref 3.5–5.0)
Alkaline Phosphatase: 43 U/L (ref 38–126)
Anion gap: 9 (ref 5–15)
BUN: 28 mg/dL — ABNORMAL HIGH (ref 8–23)
CO2: 27 mmol/L (ref 22–32)
Calcium: 8.5 mg/dL — ABNORMAL LOW (ref 8.9–10.3)
Chloride: 87 mmol/L — ABNORMAL LOW (ref 98–111)
Creatinine, Ser: 1.49 mg/dL — ABNORMAL HIGH (ref 0.61–1.24)
GFR, Estimated: 52 mL/min — ABNORMAL LOW (ref >60)
Glucose, Bld: 97 mg/dL (ref 70–99)
Potassium: 3.2 mmol/L — ABNORMAL LOW (ref 3.5–5.1)
Sodium: 123 mmol/L — ABNORMAL LOW (ref 135–145)
Total Bilirubin: 1.1 mg/dL (ref 0.3–1.2)
Total Protein: 7.2 g/dL (ref 6.5–8.1)

## 2020-12-13 LAB — ECHOCARDIOGRAM COMPLETE
AR max vel: 1.84 cm2
AV Peak grad: 6.3 mmHg
Ao pk vel: 1.25 m/s
Area-P 1/2: 5.31 cm2
Height: 68 in
S' Lateral: 3.6 cm
Weight: 4480 oz

## 2020-12-13 LAB — RESPIRATORY PANEL BY PCR

## 2020-12-13 LAB — PHOSPHORUS: Phosphorus: 2.9 mg/dL (ref 2.5–4.6)

## 2020-12-13 LAB — MAGNESIUM: Magnesium: 2.4 mg/dL (ref 1.7–2.4)

## 2020-12-13 MED ORDER — INSULIN ASPART 100 UNIT/ML IJ SOLN: 0.0000 [IU] | INTRAMUSCULAR | Status: DC

## 2020-12-13 MED ORDER — POTASSIUM CHLORIDE 10 MEQ/100ML IV SOLN: 10.0000 meq | INTRAVENOUS | Status: DC

## 2020-12-13 MED ORDER — IPRATROPIUM-ALBUTEROL 0.5-2.5 (3) MG/3ML IN SOLN: 3.0000 mL | Freq: Four times a day (QID) | RESPIRATORY_TRACT | Status: DC | PRN

## 2020-12-13 MED ORDER — POTASSIUM CHLORIDE 10 MEQ/100ML IV SOLN
10.0000 meq | INTRAVENOUS | Status: AC
  Administered 2020-12-13 (×4): 10 meq via INTRAVENOUS
  Filled 2020-12-13 (×4): qty 100

## 2020-12-13 MED ORDER — IPRATROPIUM-ALBUTEROL 0.5-2.5 (3) MG/3ML IN SOLN
3.0000 mL | Freq: Four times a day (QID) | RESPIRATORY_TRACT | Status: DC
  Administered 2020-12-13 – 2020-12-15 (×6): 3 mL via RESPIRATORY_TRACT
  Filled 2020-12-13 (×7): qty 3

## 2020-12-13 MED ORDER — METHYLPREDNISOLONE SODIUM SUCC 125 MG IJ SOLR
80.0000 mg | Freq: Every day | INTRAMUSCULAR | Status: DC
  Administered 2020-12-13 – 2020-12-14 (×2): 80 mg via INTRAVENOUS
  Filled 2020-12-13: qty 2

## 2020-12-13 MED ORDER — POTASSIUM CHLORIDE 10 MEQ/100ML IV SOLN
10.0000 meq | INTRAVENOUS | Status: AC
  Administered 2020-12-13 (×2): 10 meq via INTRAVENOUS
  Filled 2020-12-13 (×2): qty 100

## 2020-12-13 NOTE — Progress Notes (Signed)
Progress Note  Patient Name: Danny [name] Date of Encounter: 12/13/2020  Primary Cardiologist: Burt Knack  Subjective   LHC yesterday showed severe severe two-vessel CAD with chronic total occlusions of the mid LAD and ostial LCx, as noted on prior catheterizations. Mild, nonobstructive RCA disease with patent PDA stent. Widely patent LIMA-LAD bypass graft.  SVG-D1 and SVG-OM1 are known to be occluded and were not engaged on this study. Mildly elevated left ventricular filling pressure (LVEDP 15-20 mmHg) with grossly normal left ventricular contraction. No culprit lesion was identified for his troponin elevation. Medical therapy was recommended. These findings do not reflect the etiology of his symptoms and hypoxia.   Tested positive for RSV.  No chest pain. Cough and dyspnea on room air persist. O2 saturations drop into the upper 80s on room air. Remains on supplemental oxygen via nasal cannula. No left radial cath site complications. Potassium remains low.   Inpatient Medications    Scheduled Meds:  amLODipine  10 mg Oral Daily   aspirin EC  81 mg Oral Daily   Chlorhexidine Gluconate Cloth  6 each Topical Daily   clopidogrel  75 mg Oral Daily   enoxaparin (LOVENOX) injection  40 mg Subcutaneous Q24H   insulin pump   Subcutaneous TID WC, HS, 0200   isosorbide mononitrate  30 mg Oral Daily   metoprolol succinate  100 mg Oral Daily   pantoprazole  40 mg Oral Daily   rosuvastatin  40 mg Oral Daily   sodium chloride flush  3 mL Intravenous Q12H   Continuous Infusions:  sodium chloride     PRN Meds: sodium chloride, acetaminophen, alum & mag hydroxide-simeth, guaiFENesin-codeine, nitroGLYCERIN, ondansetron (ZOFRAN) IV, sodium chloride flush   Vital Signs    Vitals:   12/13/20 0400 12/13/20 0425 12/13/20 0500 12/13/20 0605  BP:  136/64 126/61 (!) 140/55  Pulse:  80 77 82  Resp:  (!) 23 14 (!) 24  Temp: 98.9 F (37.2 C)     TempSrc: Oral     SpO2:  97% 98% 97%   Weight:      Height:        Intake/Output Summary (Last 24 hours) at 12/13/2020 0747 Last data filed at 12/13/2020 0600 Gross per 24 hour  Intake 350 ml  Output 951 ml  Net -601 ml   Filed Weights   12/11/20 1732  Weight: 127 kg    Telemetry    SR - Personally Reviewed  ECG    NSR, 80 bpm, 1st degree AV block, prior inferior MI, nonspecific st/t changes - Personally Reviewed  Physical Exam   GEN: No acute distress.   Neck: No JVD. Cardiac: RRR, no murmurs, rubs, or gallops. Left radial arteriotomy site is well healing, without active bleeding, bruising, swelling, warmth, erythema, or TTP. Radial pulse 2 + proximal and distal to the arteriotomy site.  Respiratory: Diminished coarse breath sounds bilaterally with wheezing. Significant cough noted.   GI: Soft, nontender, non-distended.   MS: No edema; No deformity. Neuro:  Alert and oriented x 3; Nonfocal.  Psych: Normal affect.  Labs    Chemistry Recent Labs  Lab 12/11/20 2026 12/12/20 0612 12/12/20 0831 12/12/20 1945 12/13/20 0523  NA 120*   < > 123* 120* 123*  K 3.1*   < > 2.7* 3.4* 3.2*  CL 78*   < > 84* 85* 87*  CO2 27   < > _0 GLUCOSE 358*   < > 115* 222* 97  BUN  19   < > 20 24* 28*  CREATININE 1.21   < > 1.17 1.36* 1.49*  CALCIUM 9.0   < > 8.7* 8.2* 8.5*  PROT 7.9  --  7.5  --  7.2  ALBUMIN 4.3  --  4.3  --  4.2  AST 63*  --  63*  --  54*  ALT 56*  --  50*  --  41  ALKPHOS 47  --  50  --  43  BILITOT 1.7*  --  1.4*  --  1.1  GFRNONAA >60   < > >60 58* 52*  ANIONGAP 15   < > _0 < > = values in this interval not displayed.     Hematology Recent Labs  Lab 12/12/20 0612 12/12/20 0831 12/13/20 0523  WBC 11.6* 11.3* 10.5  RBC RESULTS UNAVAILABLE DUE TO INTERFERING SUBSTANCE 5.57 5.15  HGB 16.1 17.0 15.6  HCT RESULTS UNAVAILABLE DUE TO INTERFERING SUBSTANCE 45.3 42.4  MCV RESULTS UNAVAILABLE DUE TO INTERFERING SUBSTANCE 81.3 82.3  MCH RESULTS UNAVAILABLE DUE TO INTERFERING  SUBSTANCE 30.5 30.3  MCHC RESULTS UNAVAILABLE DUE TO INTERFERING SUBSTANCE 37.5* 36.8*  RDW RESULTS UNAVAILABLE DUE TO INTERFERING SUBSTANCE 12.5 12.9  PLT 237 244 241    Cardiac EnzymesNo results for input(s): TROPONINI in the last 168 hours. No results for input(s): TROPIPOC in the last 168 hours.   BNP Recent Labs  Lab 12/09/20 1213  BNP 82.7     DDimer No results for input(s): DDIMER in the last 168 hours.   Radiology    DG Chest 2 View  Result Date: 12/11/2020 IMPRESSION: No active cardiopulmonary disease. Electronically Signed   By: Rolm Baptise M.D.   On: 12/11/2020 18:32   CT Angio Chest Pulmonary Embolism (PE) W or WO Contrast  Result Date: 12/12/2020 IMPRESSION: 1. No evidence of pulmonary embolism or acute infiltrate. 2. Mild cardiomegaly with marked severity coronary artery calcification. 3. Small hiatal hernia. 4. Fatty liver. Electronically Signed   By: Virgina Norfolk M.D.   On: 12/12/2020 01:37   DG Chest Portable 1 View  Result Date: 12/12/2020 IMPRESSION: Cardiomegaly with mild central congestion. Electronically Signed   By: Donavan Foil M.D.   On: 12/12/2020 15:52    Cardiac Studies   LHC 12/12/2020: Conclusions: Severe two-vessel coronary artery disease with chronic total occlusions of the mid LAD and ostial LCx, as noted on prior catheterizations. Mild, nonobstructive RCA disease with patent PDA stent. Widely patent LIMA-LAD bypass graft.  SVG-D1 and SVG-OM1 are known to be occluded and were not engaged on today's study. Mildly elevated left ventricular filling pressure (LVEDP 15-20 mmHg) with grossly normal left ventricular contraction.   Recommendations: No culprit lesion for patient's elevated troponin identified.  There may be slight worsening of disease involving distal D1 branch.  I suspect his troponin elevation reflects supply-demand mismatch in the setting of respiratory illness and significant fixed CAD.  Continue secondary prevention of  coronary artery disease. Maintain net even to slightly negative fluid balance. Further work-up and management of acute respiratory illness per internal medicine.  I do not believe the patient's coronary artery disease and mildly elevated filling pressures explain his symptoms and worsening hypoxia. __________  2D echo 09/08/2020: 1. Left ventricular ejection fraction, by estimation, is >55%. The left  ventricle has low normal function. Left ventricular endocardial border not  optimally defined to evaluate regional wall motion. The left ventricular  internal cavity size was mildly  dilated. Left  ventricular diastolic parameters are consistent with Grade  II diastolic dysfunction (pseudonormalization).   2. Right ventricular systolic function is normal. The right ventricular  size is normal.   3. The mitral valve is normal in structure. No evidence of mitral valve  regurgitation. No evidence of mitral stenosis.   4. The aortic valve is tricuspid. Aortic valve regurgitation is not  visualized. No aortic stenosis is present.   Comparison(s): Previous Echo showed LV EF 50-55%, inferior wall  hypokinesis, grade I diastolic dysfunction, MAC.  __________   LHC 05/07/2019: 1. Severe 2 vessel CAD with occlusion of the left circumflex and LAD 2. Patent RCA with continued patency of the PDA stent 3. S/P CABG with known occlusion of all saphenous vein grafts and continued patency of the LIMA-LAD   Recommend: continue medical therapy/lifestyle modification  Patient Profile     65 y.o. 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 Sherral Hammers.  Assessment & Plan    1. CAD s/p CABG with subsequent PCI with NSTEMI: -Currently, without chest  pain -Symptoms were concerning for pulmonary etiology, though he did have a significant troponin elevation leading to LHC on 10/28 as outlined above with medical therapy recommended -LVEDP only mildly elevated -Underlying CAD does not reflect the driving etiology for his symptoms and hypoxia -NSTEMI likely secondary to underlying multivessel CAD and RSV infection -ASA, Plavix, Imdur -Echo pending -Post cath instructions   2. HFrEF secondary to ICM: -Subsequent normalization of LVSF -Maintain net equal to slightly negative fluid balance -Echo pending -Toprol XL   3. HTN: -Blood pressure reasonably controlled -Amlodipine, Toprol, Imdur   4. HLD:  -LDL 38 -Crestor    5. Renal dysfunction: -Monitor    6. Hypokalemia: -Replete to goal 4.0 -IV repletion ordered      Remaining noncardiac issues, including RSV are deferred to the per primary service   For questions or updates, please contact [place] Please consult [email or web address] for contact info under Cardiology/STEMI.    Signed, Christell Faith, PA-C [place] Pager: [phone] 12/13/2020, 7:47 AM

## 2020-12-13 NOTE — Progress Notes (Signed)
PROGRESS NOTE    Danny [name]  WNI:[id number] DOB: Jun 21, 1955 DOA: 12/11/2020 PCP: Lorenso Quarry, NP     Brief Narrative:  65 y.o. WM PMHx HTN, Chronic combined Systolic & Diastolic CHF, ischemic cardiomyopathy (EF 45 to 50%), CAD, HLD, DM type II uncontrolled with hyperglycemia.  Presents with complaints of cough for past few weeks.  Patient complains of nausea vomiting cough no shortness of breath no chest pain no palpitations no headaches blurred vision speech or gait issues, abdomen is distended nontender bowel sounds are within normal limits on exam.  Patient denies any other complaints.     Subjective: 10/29 A/O x4, positive SOB but improved.  Positive nonproductive cough   Assessment & Plan: Covid vaccination; vaccinated 3/3   Principal Problem:   Cough Active Problems:   S/P CABG x 3   Chronic combined systolic and diastolic congestive heart failure (HCC)   Ischemic cardiomyopathy   Essential hypertension   Hyperlipidemia   Coronary artery disease involving native coronary artery of native heart without angina pectoris   Hyponatremia   Hypokalemia   Non-ST elevation (NSTEMI) myocardial infarction (HCC)   Severe obesity (BMI >= 40) (HCC)   RSV (respiratory syncytial virus pneumonia)  NSTEMI (non-ST elevated myocardial infarction) (HCC)-s/p CABG x3   Chronic combined systolic and diastolic congestive heart failure (HCC)   Coronary artery disease involving native coronary artery of native heart without angina pectoris -10/28 cardiology consult  -10/28 heart catheterization no significant change see results below -Strict ins and out -893.21ml - Daily weight Filed Weights   12/11/20 1732  Weight: 127 kg     ICM -See NSTEMI - 10/29 echocardiogram pending  CAD - See NSTEMI  RSV Pneumonia -SARS coronavirus negative - 10/28 we will obtain respiratory virus panel, given that RSV is endemic - Continue Mucinex-codeine PRN cough - 10/28 CTA PE protocol  negative - DuoNeb QID -Solu-Medrol 80 mg daily - Flutter valve - Incentive spirometry  Hyponatremia - Hold diuretics - Normal saline bolus 500 mill x1 - Repeat BMP@2000   Hypokalemia: -Potassium goal> 4 - 10/29 Potassium  60 mEq   Severe obesity (BMI 42.57 kg/m) -    DVT prophylaxis: Lovenox Code Status: Full Family Communication:  Status is: Inpatient    Dispo: The patient is from:               Anticipated d/c is to: Home              Anticipated d/c date is: 2 days              Patient currently is not medically stable to d/c.      Consultants:  Cardiology  Procedures/Significant Events:  10/28 Cardiac Catheterization Severe two-vessel coronary artery disease with chronic total occlusions of the mid LAD and ostial LCx, as noted on prior catheterizations. Mild, nonobstructive RCA disease with patent PDA stent. Widely patent LIMA-LAD bypass graft.  SVG-D1 and SVG-OM1 are known to be occluded and were not engaged on today's study. Mildly elevated left ventricular filling pressure (LVEDP 15-20 mmHg) with grossly normal left ventricular contraction.  10/29 echocardiogram Left Ventricle: Moderate hypertrophy of the basal septum with otherwise  mild concentric LVH. Left ventricular ejection fraction, by estimation, is  55 to 60%. The left ventricle has normal function. The left ventricle has  no regional wall motion  abnormalities. The left ventricular internal cavity size was normal in  size. There is moderate left ventricular hypertrophy of the basal-septal  segment. Left ventricular diastolic  parameters are consistent with Grade  II diastolic dysfunction  (pseudonormalization). Elevated left ventricular end-diastolic pressure.   Right Ventricle: The right ventricular size is normal. No increase in  right ventricular wall thickness. Right ventricular systolic function is  normal.   Left Atrium: Left atrial size was normal in size.   Right Atrium: Right  atrial size was normal in size.   Pericardium: There is no evidence of pericardial effusion.   Mitral Valve: The mitral valve is normal in structure. Trivial mitral  valve regurgitation. No evidence of mitral valve stenosis.   Tricuspid Valve: The tricuspid valve is normal in structure. Tricuspid  valve regurgitation is trivial. No evidence of tricuspid stenosis.   Aortic Valve: The aortic valve is tricuspid. Aortic valve regurgitation is  trivial. No aortic stenosis is present. Aortic valve peak gradient  measures 6.2 mmHg.   Pulmonic Valve: The pulmonic valve was normal in structure. Pulmonic valve  regurgitation is trivial. No evidence of pulmonic stenosis.   Aorta: The aortic root is normal in size and structure.   Venous: The inferior vena cava was not well visualized. The inferior vena  cava is normal in size with greater than 50% respiratory variability,  suggesting right atrial pressure of 3 mmHg.   IAS/Shunts: No atrial level shunt detected by color flow Doppler.   I have personally reviewed and interpreted all radiology studies and my findings are as above.  VENTILATOR SETTINGS:    Cultures   Antimicrobials:    Devices    LINES / TUBES:      Continuous Infusions:  sodium chloride       Objective: Vitals:   12/13/20 1500 12/13/20 1600 12/13/20 1649 12/13/20 1700  BP: (!) 127/109 (!) 133/113  (!) 151/67  Pulse: 71 72  71  Resp: Temp:    99.1 F (37.3 C)  TempSrc:    Oral  SpO2: 96% 92% 94% 95%  Weight:      Height:        Intake/Output Summary (Last 24 hours) at 12/13/2020 1932 Last data filed at 12/13/2020 1640 Gross per 24 hour  Intake 353 ml  Output 1396 ml  Net -1043 ml   Filed Weights   12/11/20 1732  Weight: 127 kg    Examination:  General: A/O x4, No acute respiratory distress Eyes: negative scleral hemorrhage, negative anisocoria, negative icterus ENT: Negative Runny nose, negative gingival bleeding, Neck:   Negative scars, masses, torticollis, lymphadenopathy, JVD Lungs: Clear to auscultation bilaterally without wheezes or crackles, positive nonproductive cough Cardiovascular: Regular rate and rhythm without murmur gallop or rub normal S1 and S2, positive substernal chest pain Abdomen: negative abdominal pain, nondistended, positive soft, bowel sounds, no rebound, no ascites, no appreciable mass Extremities: No significant cyanosis, clubbing, +1 bilateral lower extremity edema to knees Skin: Negative rashes, lesions, ulcers Psychiatric:  Negative depression, negative anxiety, negative fatigue, negative mania  Central nervous system:  Cranial nerves II through XII intact, tongue/uvula midline, all extremities muscle strength 5/5, sensation intact throughout, f negative dysarthria, negative expressive aphasia, negative receptive aphasia.  .     Data Reviewed: Care during the described time interval was provided by me .  I have reviewed this patient's available data, including medical history, events of note, physical examination, and all test results as part of my evaluation.  CBC: Recent Labs  Lab 12/11/20 2026 12/12/20 0612 12/12/20 0831 12/13/20 0523  WBC 11.5* 11.6* 11.3* 10.5  NEUTROABS 9.2*  --  8.6*  7.7  HGB 16.1 16.1 17.0 15.6  HCT 43.3 RESULTS UNAVAILABLE DUE TO INTERFERING SUBSTANCE 45.3 42.4  MCV 80.3 RESULTS UNAVAILABLE DUE TO INTERFERING SUBSTANCE 81.3 82.3  PLT 220 237 244 241   Basic Metabolic Panel: Recent Labs  Lab 12/11/20 2026 12/12/20 0612 12/12/20 0831 12/12/20 1945 12/13/20 0523  NA 120* 124* 123* 120* 123*  K 3.1* 2.7* 2.7* 3.4* 3.2*  CL 78* 82* 84* 85* 87*  CO2 27 31 30 25 27   GLUCOSE 358* 114* 115* 222* 97  BUN 19 19 20  24* 28*  CREATININE 1.21 1.36* 1.17 1.36* 1.49*  CALCIUM 9.0 8.8* 8.7* 8.2* 8.5*  MG  --   --  2.3  --  2.4  PHOS  --   --  3.1  --  2.9   GFR: Estimated Creatinine Clearance: 65 mL/min (A) (by C-G formula based on SCr of 1.49 mg/dL  (H)). Liver Function Tests: Recent Labs  Lab 12/11/20 2026 12/12/20 0831 12/13/20 0523  AST 63* 63* 54*  ALT 56* 50* 41  ALKPHOS 47 50 43  BILITOT 1.7* 1.4* 1.1  PROT 7.9 7.5 7.2  ALBUMIN 4.3 4.3 4.2   Recent Labs  Lab 12/11/20 2026  LIPASE 22   No results for input(s): AMMONIA in the last 168 hours. Coagulation Profile: Recent Labs  Lab 12/11/20 2229  INR 1.0   Cardiac Enzymes: No results for input(s): CKTOTAL, CKMB, CKMBINDEX, TROPONINI in the last 168 hours. BNP (last 3 results) Recent Labs    08/08/20 1258  PROBNP 141   HbA1C: Recent Labs    12/12/20 0831  HGBA1C 7.4*   CBG: Recent Labs  Lab 12/12/20 1215  GLUCAP 226*   Lipid Profile: Recent Labs    12/12/20 0612  CHOL 91  HDL 33*  LDLCALC 38  TRIG 12/14/20  CHOLHDL 2.8   Thyroid Function Tests: No results for input(s): TSH, T4TOTAL, FREET4, T3FREE, THYROIDAB in the last 72 hours. Anemia Panel: No results for input(s): VITAMINB12, FOLATE, FERRITIN, TIBC, IRON, RETICCTPCT in the last 72 hours. Sepsis Labs: No results for input(s): PROCALCITON, LATICACIDVEN in the last 168 hours.  Recent Results (from the past 240 hour(s))  Resp Panel by RT-PCR (Flu A&B, Covid) Nasopharyngeal Swab     Status: None   Collection Time: 12/11/20  8:26 PM   Specimen: Nasopharyngeal Swab; Nasopharyngeal(NP) swabs in vial transport medium  Result Value Ref Range Status   SARS Coronavirus 2 by RT PCR NEGATIVE NEGATIVE Final    Comment: (NOTE) SARS-CoV-2 target nucleic acids are NOT DETECTED.  The SARS-CoV-2 RNA is generally detectable in upper respiratory specimens during the acute phase of infection. The lowest concentration of SARS-CoV-2 viral copies this assay can detect is 138 copies/mL. A negative result does not preclude SARS-Cov-2 infection and should not be used as the sole basis for treatment or other patient management decisions. A negative result may occur with  improper specimen collection/handling,  submission of specimen other than nasopharyngeal swab, presence of viral mutation(s) within the areas targeted by this assay, and inadequate number of viral copies(<138 copies/mL). A negative result must be combined with clinical observations, patient history, and epidemiological information. The expected result is Negative.  Fact Sheet for Patients:  673  Fact Sheet for Healthcare Providers:  12/13/20  This test is no t yet approved or cleared by the [employer] FDA and  has been authorized for detection and/or diagnosis of SARS-CoV-2 by FDA under an Emergency Use Authorization (EUA). This EUA will remain  in effect (meaning this test can be used) for the duration of the COVID-19 declaration under Section 564(b)(1) of the Act, 21 U.S.C.section 360bbb-3(b)(1), unless the authorization is terminated  or revoked sooner.       Influenza A by PCR NEGATIVE NEGATIVE Final   Influenza B by PCR NEGATIVE NEGATIVE Final    Comment: (NOTE) The Xpert Xpress SARS-CoV-2/FLU/RSV plus assay is intended as an aid in the diagnosis of influenza from Nasopharyngeal swab specimens and should not be used as a sole basis for treatment. Nasal washings and aspirates are unacceptable for Xpert Xpress SARS-CoV-2/FLU/RSV testing.  Fact Sheet for Patients: [employer]  Fact Sheet for Healthcare Providers: [employer]it  This test is not yet approved or cleared by the [place] FDA and has been authorized for detection and/or diagnosis of SARS-CoV-2 by FDA under an Emergency Use Authorization (EUA). This EUA will remain in effect (meaning this test can be used) for the duration of the COVID-19 declaration under Section 564(b)(1) of the Act, 21 U.S.C. section 360bbb-3(b)(1), unless the authorization is terminated or revoked.  Performed at [facility], [address] [place], [place] [address]   MRSA Next Gen by PCR, Nasal     Status: None   Collection Time: 12/12/20  8:14 PM   Specimen: Nasal Mucosa; Nasal Swab  Result Value Ref Range Status   MRSA by PCR Next Gen NOT DETECTED NOT DETECTED Final    Comment: (NOTE) The GeneXpert MRSA Assay (FDA approved for NASAL specimens only), is one component of a comprehensive MRSA colonization surveillance program. It is not intended to diagnose MRSA infection nor to guide or monitor treatment for MRSA infections. Test performance is not FDA approved in patients less than 79 years old. Performed at [facility], [address] [place], [place] [address]   Respiratory (~20 pathogens) panel by PCR     Status: Abnormal   Collection Time: 12/12/20  8:16 PM   Specimen: Nasopharyngeal Swab; Respiratory  Result Value Ref Range Status   Adenovirus NOT DETECTED NOT DETECTED Final   Coronavirus 229E NOT DETECTED NOT DETECTED Final    Comment: (NOTE) The Coronavirus on the Respiratory Panel, DOES NOT test for the novel  Coronavirus (2019 nCoV)    Coronavirus HKU1 NOT DETECTED NOT DETECTED Final   Coronavirus NL63 NOT DETECTED NOT DETECTED Final   Coronavirus OC43 NOT DETECTED NOT DETECTED Final   Metapneumovirus NOT DETECTED NOT DETECTED Final   Rhinovirus / Enterovirus NOT DETECTED NOT DETECTED Final   Influenza A NOT DETECTED NOT DETECTED Final   Influenza B NOT DETECTED NOT DETECTED Final   Parainfluenza Virus 1 NOT DETECTED NOT DETECTED Final   Parainfluenza Virus 2 NOT DETECTED NOT DETECTED Final   Parainfluenza Virus 3 NOT DETECTED NOT DETECTED Final   Parainfluenza Virus 4 NOT DETECTED NOT DETECTED Final   Respiratory Syncytial Virus DETECTED (A) NOT DETECTED Final   Bordetella pertussis NOT DETECTED NOT DETECTED Final   Bordetella Parapertussis NOT DETECTED NOT DETECTED Final   Chlamydophila pneumoniae NOT DETECTED NOT DETECTED Final   Mycoplasma pneumoniae NOT DETECTED NOT  DETECTED Final    Comment: Performed at [facility], [address], [place], [place] [address]         Radiology Studies: CT Angio Chest Pulmonary Embolism (PE) W or WO Contrast  Result Date: 12/12/2020 CLINICAL DATA:  Dizziness and weakness. EXAM: CT ANGIOGRAPHY CHEST WITH CONTRAST TECHNIQUE: Multidetector CT imaging of the chest was performed using the standard protocol  during bolus administration of intravenous contrast. Multiplanar CT image reconstructions and MIPs were obtained to evaluate the vascular anatomy. CONTRAST:  OMNIPAQUE IOHEXOL 350 MG/ML SOLN COMPARISON:  None. FINDINGS: Cardiovascular: Satisfactory opacification of the pulmonary arteries to the segmental level. No evidence of pulmonary embolism. Mild cardiomegaly with marked severity coronary artery calcification. No pericardial effusion. Mediastinum/Nodes: No enlarged mediastinal, hilar, or axillary lymph nodes. Thyroid gland, trachea, and esophagus demonstrate no significant findings. Lungs/Pleura: Very mild linear atelectasis is seen within the bilateral lower lobes. There is no evidence of acute infiltrate, pleural effusion or pneumothorax. Upper Abdomen: There is a small hiatal hernia. Diffuse fatty infiltration of the liver parenchyma is noted. Musculoskeletal: Multiple sternal wires are seen. Degenerative changes seen throughout the thoracic spine. Review of the MIP images confirms the above findings. IMPRESSION: 1. No evidence of pulmonary embolism or acute infiltrate. 2. Mild cardiomegaly with marked severity coronary artery calcification. 3. Small hiatal hernia. 4. Fatty liver. Electronically Signed   By: Aram Candela M.D.   On: 12/12/2020 01:37   CARDIAC CATHETERIZATION  Result Date: 12/12/2020 Conclusions: Severe two-vessel coronary artery disease with chronic total occlusions of the mid LAD and ostial LCx, as noted on prior catheterizations. Mild, nonobstructive RCA disease with patent PDA stent.  Widely patent LIMA-LAD bypass graft.  SVG-D1 and SVG-OM1 are known to be occluded and were not engaged on today's study. Mildly elevated left ventricular filling pressure (LVEDP 15-20 mmHg) with grossly normal left ventricular contraction. Recommendations: No culprit lesion for patient's elevated troponin identified.  There may be slight worsening of disease involving distal D1 branch.  I suspect his troponin elevation reflects supply-demand mismatch in the setting of respiratory illness and significant fixed CAD.  Continue secondary prevention of coronary artery disease. Maintain net even to slightly negative fluid balance. Further work-up and management of acute respiratory illness per internal medicine.  I do not believe the patient's coronary artery disease and mildly elevated filling pressures explain his symptoms and worsening hypoxia. Yvonne Kendall, MD [facility]  DG Chest Portable 1 View  Result Date: 12/12/2020 CLINICAL DATA:  Cold symptoms EXAM: PORTABLE CHEST 1 VIEW COMPARISON:  12/11/2020 FINDINGS: Post sternotomy changes. Cardiomegaly with mild central congestion but no edema, pleural effusion, or focal airspace disease. No pneumothorax. IMPRESSION: Cardiomegaly with mild central congestion. Electronically Signed   By: Jasmine Pang M.D.   On: 12/12/2020 15:52   ECHOCARDIOGRAM COMPLETE  Result Date: 12/13/2020    ECHOCARDIOGRAM REPORT   Patient Name:   Danny [name] Date of Exam: 12/13/2020 Medical Rec #:  [id number]             Height:       68.0 in Accession #:    [id number]            Weight:       280.0 lb Date of Birth:  February 09, 1956             BSA:          2.358 m Patient Age:    65 years              BP:           131/67 mmHg Patient Gender: M                     HR:           88 bpm. Exam Location:  [facility] Procedure: 2D Echo, Cardiac Doppler and Color Doppler Indications:  NSTEMI I21.4  History:         Patient has prior history of Echocardiogram examinations. CHF,                   CAD; Risk Factors:Hypertension.  Sonographer:     Neysa Bonito Roar Referring Phys:  [id number] CHRISTOPHER END Diagnosing Phys: Chilton Si MD IMPRESSIONS  1. Moderate hypertrophy of the basal septum with otherwise mild concentric LVH. Left ventricular ejection fraction, by estimation, is 55 to 60%. The left ventricle has normal function. The left ventricle has no regional wall motion abnormalities. There is moderate left ventricular hypertrophy of the basal-septal segment. Left ventricular diastolic parameters are consistent with Grade II diastolic dysfunction (pseudonormalization). Elevated left ventricular end-diastolic pressure.  2. Right ventricular systolic function is normal. The right ventricular size is normal.  3. The mitral valve is normal in structure. Trivial mitral valve regurgitation. No evidence of mitral stenosis.  4. The aortic valve is tricuspid. Aortic valve regurgitation is trivial. No aortic stenosis is present.  5. The inferior vena cava is normal in size with greater than 50% respiratory variability, suggesting right atrial pressure of 3 mmHg. FINDINGS  Left Ventricle: Moderate hypertrophy of the basal septum with otherwise mild concentric LVH. Left ventricular ejection fraction, by estimation, is 55 to 60%. The left ventricle has normal function. The left ventricle has no regional wall motion abnormalities. The left ventricular internal cavity size was normal in size. There is moderate left ventricular hypertrophy of the basal-septal segment. Left ventricular diastolic parameters are consistent with Grade II diastolic dysfunction (pseudonormalization). Elevated left ventricular end-diastolic pressure. Right Ventricle: The right ventricular size is normal. No increase in right ventricular wall thickness. Right ventricular systolic function is normal. Left Atrium: Left atrial size was normal in size. Right Atrium: Right atrial size was normal in size. Pericardium: There is no evidence of  pericardial effusion. Mitral Valve: The mitral valve is normal in structure. Trivial mitral valve regurgitation. No evidence of mitral valve stenosis. Tricuspid Valve: The tricuspid valve is normal in structure. Tricuspid valve regurgitation is trivial. No evidence of tricuspid stenosis. Aortic Valve: The aortic valve is tricuspid. Aortic valve regurgitation is trivial. No aortic stenosis is present. Aortic valve peak gradient measures 6.2 mmHg. Pulmonic Valve: The pulmonic valve was normal in structure. Pulmonic valve regurgitation is trivial. No evidence of pulmonic stenosis. Aorta: The aortic root is normal in size and structure. Venous: The inferior vena cava was not well visualized. The inferior vena cava is normal in size with greater than 50% respiratory variability, suggesting right atrial pressure of 3 mmHg. IAS/Shunts: No atrial level shunt detected by color flow Doppler.  LEFT VENTRICLE PLAX 2D LVIDd:         4.80 cm   Diastology LVIDs:         3.60 cm   LV e' medial:    5.55 cm/s LV PW:         1.20 cm   LV E/e' medial:  17.5 LV IVS:        1.50 cm   LV e' lateral:   13.30 cm/s LVOT diam:     1.80 cm   LV E/e' lateral: 7.3 LVOT Area:     2.54 cm  LEFT ATRIUM           Index LA diam:      3.80 cm 1.61 cm/m LA Vol (A4C): 52.8 ml 22.39 ml/m  AORTIC VALVE  PULMONIC VALVE AV Area (Vmax): 1.84 cm     PV Vmax:          1.17 m/s AV Vmax:        125.00 cm/s  PV Peak grad:     5.5 mmHg AV Peak Grad:   6.2 mmHg     PR End Diast Vel: 5.29 msec LVOT Vmax:      90.40 cm/s   RVOT Peak grad:   1 mmHg  AORTA Ao Root diam: 2.80 cm MITRAL VALVE               TRICUSPID VALVE MV Area (PHT): 5.31 cm    TR Peak grad:   8.4 mmHg MV Decel Time: 143 msec    TR Vmax:        145.00 cm/s MV E velocity: 97.30 cm/s MV A velocity: 78.80 cm/s  SHUNTS MV E/A ratio:  1.23        Systemic Diam: 1.80 cm MV A Prime:    7.6 cm/s Chilton Si MD Electronically signed by Chilton Si MD Signature Date/Time:  12/13/2020/2:58:24 PM    Final         Scheduled Meds:  amLODipine  10 mg Oral Daily   aspirin EC  81 mg Oral Daily   Chlorhexidine Gluconate Cloth  6 each Topical Daily   clopidogrel  75 mg Oral Daily   enoxaparin (LOVENOX) injection  40 mg Subcutaneous Q24H   insulin pump   Subcutaneous TID WC, HS, 0200   ipratropium-albuterol  3 mL Nebulization QID   isosorbide mononitrate  30 mg Oral Daily   methylPREDNISolone (SOLU-MEDROL) injection  80 mg Intravenous Daily   metoprolol succinate  100 mg Oral Daily   pantoprazole  40 mg Oral Daily   rosuvastatin  40 mg Oral Daily   sodium chloride flush  3 mL Intravenous Q12H   Continuous Infusions:  sodium chloride       LOS: 1 day    Time spent:40 min    Rabecca Birge, Roselind Messier, MD [facility]   If 7PM-7AM, please contact night-coverage 12/13/2020, 7:32 PM

## 2020-12-13 NOTE — Progress Notes (Signed)
*  PRELIMINARY RESULTS* Echocardiogram 2D Echocardiogram has been performed.  Neita Garnet Lakhia Gengler 12/13/2020, 11:39 AM

## 2020-12-14 DIAGNOSIS — I214 Non-ST elevation (NSTEMI) myocardial infarction: Secondary | ICD-10-CM | POA: Diagnosis not present

## 2020-12-14 DIAGNOSIS — I5042 Chronic combined systolic (congestive) and diastolic (congestive) heart failure: Secondary | ICD-10-CM

## 2020-12-14 DIAGNOSIS — I2510 Atherosclerotic heart disease of native coronary artery without angina pectoris: Secondary | ICD-10-CM | POA: Diagnosis not present

## 2020-12-14 DIAGNOSIS — R059 Cough, unspecified: Secondary | ICD-10-CM | POA: Diagnosis not present

## 2020-12-14 LAB — COMPREHENSIVE METABOLIC PANEL
ALT: 37 U/L (ref 0–44)
AST: 47 U/L — ABNORMAL HIGH (ref 15–41)
Albumin: 3.6 g/dL (ref 3.5–5.0)
Alkaline Phosphatase: 44 U/L (ref 38–126)
Anion gap: 9 (ref 5–15)
BUN: 26 mg/dL — ABNORMAL HIGH (ref 8–23)
CO2: 28 mmol/L (ref 22–32)
Calcium: 8.6 mg/dL — ABNORMAL LOW (ref 8.9–10.3)
Chloride: 90 mmol/L — ABNORMAL LOW (ref 98–111)
Creatinine, Ser: 1.28 mg/dL — ABNORMAL HIGH (ref 0.61–1.24)
GFR, Estimated: >60 mL/min (ref >60)
Glucose, Bld: 104 mg/dL — ABNORMAL HIGH (ref 70–99)
Potassium: 3 mmol/L — ABNORMAL LOW (ref 3.5–5.1)
Sodium: 127 mmol/L — ABNORMAL LOW (ref 135–145)
Total Bilirubin: 1.1 mg/dL (ref 0.3–1.2)
Total Protein: 7.1 g/dL (ref 6.5–8.1)

## 2020-12-14 LAB — CBC WITH DIFFERENTIAL/PLATELET
Abs Immature Granulocytes: 0.05 10*3/uL (ref 0.00–0.07)
Basophils Absolute: 0.1 10*3/uL (ref 0.0–0.1)
Basophils Relative: 1 %
Eosinophils Absolute: 0.2 10*3/uL (ref 0.0–0.5)
Eosinophils Relative: 2 %
HCT: 40.5 % (ref 39.0–52.0)
Hemoglobin: 15.2 g/dL (ref 13.0–17.0)
Immature Granulocytes: 1 %
Lymphocytes Relative: 19 %
Lymphs Abs: 1.6 10*3/uL (ref 0.7–4.0)
MCH: 31.4 pg (ref 26.0–34.0)
MCHC: 37.5 g/dL — ABNORMAL HIGH (ref 30.0–36.0)
MCV: 83.7 fL (ref 80.0–100.0)
Monocytes Absolute: 0.9 10*3/uL (ref 0.1–1.0)
Monocytes Relative: 11 %
Neutro Abs: 5.7 10*3/uL (ref 1.7–7.7)
Neutrophils Relative %: 66 %
Platelets: 205 10*3/uL (ref 150–400)
RBC: 4.84 MIL/uL (ref 4.22–5.81)
RDW: 12.4 % (ref 11.5–15.5)
WBC: 8.5 10*3/uL (ref 4.0–10.5)
nRBC: 0 % (ref 0.0–0.2)

## 2020-12-14 LAB — GLUCOSE, CAPILLARY: Glucose-Capillary: 147 mg/dL — ABNORMAL HIGH (ref 70–99)

## 2020-12-14 LAB — PHOSPHORUS: Phosphorus: 4.2 mg/dL (ref 2.5–4.6)

## 2020-12-14 LAB — MAGNESIUM: Magnesium: 2.6 mg/dL — ABNORMAL HIGH (ref 1.7–2.4)

## 2020-12-14 MED ORDER — POTASSIUM CHLORIDE CRYS ER 20 MEQ PO TBCR
40.0000 meq | EXTENDED_RELEASE_TABLET | Freq: Two times a day (BID) | ORAL | Status: DC
  Filled 2020-12-14: qty 2

## 2020-12-14 MED ORDER — POTASSIUM CHLORIDE CRYS ER 20 MEQ PO TBCR
40.0000 meq | EXTENDED_RELEASE_TABLET | Freq: Two times a day (BID) | ORAL | Status: AC
  Administered 2020-12-14 – 2020-12-15 (×3): 40 meq via ORAL
  Filled 2020-12-14 (×3): qty 2

## 2020-12-14 MED ORDER — IPRATROPIUM-ALBUTEROL 0.5-2.5 (3) MG/3ML IN SOLN
3.0000 mL | RESPIRATORY_TRACT | Status: DC | PRN
  Administered 2020-12-14: 05:00:00 3 mL via RESPIRATORY_TRACT
  Filled 2020-12-14: qty 3

## 2020-12-14 MED ORDER — PREDNISONE 50 MG PO TABS
50.0000 mg | ORAL_TABLET | Freq: Every day | ORAL | Status: DC
  Administered 2020-12-15 – 2020-12-16 (×2): 50 mg via ORAL
  Filled 2020-12-14 (×2): qty 1

## 2020-12-14 NOTE — Plan of Care (Signed)
  Problem: Education: Goal: Knowledge of General Education information will improve Description: Including pain rating scale, medication(s)/side effects and non-pharmacologic comfort measures Outcome: Progressing   Problem: Clinical Measurements: Goal: Respiratory complications will improve Outcome: Progressing   Problem: Clinical Measurements: Goal: Cardiovascular complication will be avoided Outcome: Progressing   Problem: Activity: Goal: Risk for activity intolerance will decrease Outcome: Progressing   Problem: Safety: Goal: Ability to remain free from injury will improve Outcome: Progressing   

## 2020-12-14 NOTE — Progress Notes (Addendum)
Pt is complaining of SOB and some expiratory wheezing. NP morrison made aware. Will continue to monitor.  Update 0349: NP Jon Billings placed order. Will continue to monitor.

## 2020-12-14 NOTE — Progress Notes (Signed)
PROGRESS NOTE    Danny [name]  DGL:[id number] DOB: 24-May-1955 DOA: 12/11/2020 PCP: Lorenso Quarry, NP     Brief Narrative:  65 y.o. WM PMHx HTN, Chronic combined Systolic & Diastolic CHF, ischemic cardiomyopathy (EF 45 to 50%), CAD, HLD, DM type II uncontrolled with hyperglycemia.  Presents with complaints of cough for past few weeks.  Patient complains of nausea vomiting cough no shortness of breath no chest pain no palpitations no headaches blurred vision speech or gait issues, abdomen is distended nontender bowel sounds are within normal limits on exam.  Patient denies any other complaints.     Subjective: 10/30    A/O x4, positive SOB but improved.  Positive nonproductive cough   Assessment & Plan: Covid vaccination; vaccinated 3/3   Principal Problem:   Cough Active Problems:   S/P CABG x 3   Chronic combined systolic and diastolic congestive heart failure (HCC)   Ischemic cardiomyopathy   Essential hypertension   Hyperlipidemia   Coronary artery disease involving native coronary artery of native heart without angina pectoris   Hyponatremia   Hypokalemia   Non-ST elevation (NSTEMI) myocardial infarction (HCC)   Severe obesity (BMI >= 40) (HCC)   RSV (respiratory syncytial virus pneumonia)  NSTEMI (non-ST elevated myocardial infarction) (HCC)-s/p CABG x3   Chronic combined systolic and diastolic congestive heart failure (HCC)   Coronary artery disease involving native coronary artery of native heart without angina pectoris -10/28 cardiology consult  -10/28 heart catheterization no significant change see results below -Strict ins and out -1.9 L (10/30) - Daily weight Filed Weights   12/11/20 1732  Weight: 127 kg       ICM -See NSTEMI - 10/29 echocardiogram pending  CAD - See NSTEMI  RSV Pneumonia -SARS coronavirus negative - 10/28 we will obtain respiratory virus panel, given that RSV is endemic - Continue Mucinex-codeine PRN cough - 10/28  CTA PE protocol negative - DuoNeb QID - 10/30 change Solu-Medrol 80 mg daily---> Prednisone 50 mg daily - Flutter valve - Incentive spirometry  Hyponatremia - Hold diuretics - Normal saline bolus 500 mill x1 - Repeat BMP@2000   Hypokalemia: -Potassium goal> 4 - K-Dur 40 meq BID x 3 doses  Severe obesity (BMI 42.57 kg/m) -    DVT prophylaxis: Lovenox Code Status: Full Family Communication:  Status is: Inpatient    Dispo: The patient is from:               Anticipated d/c is to: Home              Anticipated d/c date is: 2 days              Patient currently is not medically stable to d/c.      Consultants:  Cardiology  Procedures/Significant Events:  10/28 Cardiac Catheterization Severe two-vessel coronary artery disease with chronic total occlusions of the mid LAD and ostial LCx, as noted on prior catheterizations. Mild, nonobstructive RCA disease with patent PDA stent. Widely patent LIMA-LAD bypass graft.  SVG-D1 and SVG-OM1 are known to be occluded and were not engaged on today's study. Mildly elevated left ventricular filling pressure (LVEDP 15-20 mmHg) with grossly normal left ventricular contraction.  10/29 echocardiogram Left Ventricle: Moderate hypertrophy of the basal septum with otherwise  mild concentric LVH.  -LVEF =55 to 60%. The left ventricle has normal function. The left ventricle has  no regional wall motion abnormalities. Moderate LVH of the basal-septal segment.  -Grade II diastolic dysfunction  (pseudonormalization). Elevated left ventricular end-diastolic  pressure.      I have personally reviewed and interpreted all radiology studies and my findings are as above.  VENTILATOR SETTINGS: Nasal cannula 10/30 Flow 2 L/min SPO2 94%   Cultures   Antimicrobials:    Devices    LINES / TUBES:      Continuous Infusions:  sodium chloride       Objective: Vitals:   12/14/20 0725 12/14/20 0745 12/14/20 1140 12/14/20 1204  BP:   139/71  (!) 144/72  Pulse:  78  74  Resp:  20  18  Temp:  98.4 F (36.9 C)  98.5 F (36.9 C)  TempSrc:      SpO2: 90% 90% 90% 94%  Weight:      Height:        Intake/Output Summary (Last 24 hours) at 12/14/2020 1537 Last data filed at 12/14/2020 0809 Gross per 24 hour  Intake 100 ml  Output 1550 ml  Net -1450 ml    Filed Weights   12/11/20 1732  Weight: 127 kg    Examination:  General: A/O x4, No acute respiratory distress Eyes: negative scleral hemorrhage, negative anisocoria, negative icterus ENT: Negative Runny nose, negative gingival bleeding, Neck:  Negative scars, masses, torticollis, lymphadenopathy, JVD Lungs: decreased air movement bilaterally, positive expiratory wheezes positive nonproductive cough Cardiovascular: Regular rate and rhythm without murmur gallop or rub normal S1 and S2, positive substernal chest pain Abdomen: negative abdominal pain, nondistended, positive soft, bowel sounds, no rebound, no ascites, no appreciable mass Extremities: No significant cyanosis, clubbing, +1 bilateral lower extremity edema to knees Skin: Negative rashes, lesions, ulcers Psychiatric:  Negative depression, negative anxiety, negative fatigue, negative mania  Central nervous system:  Cranial nerves II through XII intact, tongue/uvula midline, all extremities muscle strength 5/5, sensation intact throughout, negative dysarthria, negative expressive aphasia, negative receptive aphasia.  .     Data Reviewed: Care during the described time interval was provided by me .  I have reviewed this patient's available data, including medical history, events of note, physical examination, and all test results as part of my evaluation.  CBC: Recent Labs  Lab 12/11/20 2026 12/12/20 0612 12/12/20 0831 12/13/20 0523 12/14/20 0457  WBC 11.5* 11.6* 11.3* 10.5 8.5  NEUTROABS 9.2*  --  8.6* 7.7 5.7  HGB 16.1 16.1 17.0 15.6 15.2  HCT 43.3 RESULTS UNAVAILABLE DUE TO INTERFERING  SUBSTANCE 45.3 42.4 40.5  MCV 80.3 RESULTS UNAVAILABLE DUE TO INTERFERING SUBSTANCE 81.3 82.3 83.7  PLT 220 237 244 241 [id number]    Basic Metabolic Panel: Recent Labs  Lab 12/12/20 0612 12/12/20 0831 12/12/20 1945 12/13/20 0523 12/14/20 0457  NA 124* 123* 120* 123* 127*  K 2.7* 2.7* 3.4* 3.2* 3.0*  CL 82* 84* 85* 87* 90*  CO2 31 30 25 27 28   GLUCOSE 114* 115* 222* 97 104*  BUN 19 20 24* 28* 26*  CREATININE 1.36* 1.17 1.36* 1.49* 1.28*  CALCIUM 8.8* 8.7* 8.2* 8.5* 8.6*  MG  --  2.3  --  2.4 2.6*  PHOS  --  3.1  --  2.9 4.2    GFR: Estimated Creatinine Clearance: 75.7 mL/min (A) (by C-G formula based on SCr of 1.28 mg/dL (H)). Liver Function Tests: Recent Labs  Lab 12/11/20 2026 12/12/20 0831 12/13/20 0523 12/14/20 0457  AST 63* 63* 54* 47*  ALT 56* 50* 41 37  ALKPHOS 47 50 43 44  BILITOT 1.7* 1.4* 1.1 1.1  PROT 7.9 7.5 7.2 7.1  ALBUMIN 4.3 4.3 4.2 3.6  Recent Labs  Lab 12/11/20 2026  LIPASE 22    No results for input(s): AMMONIA in the last 168 hours. Coagulation Profile: Recent Labs  Lab 12/11/20 2229  INR 1.0    Cardiac Enzymes: No results for input(s): CKTOTAL, CKMB, CKMBINDEX, TROPONINI in the last 168 hours. BNP (last 3 results) Recent Labs    08/08/20 1258  PROBNP 141    HbA1C: Recent Labs    12/12/20 0831 12/13/20 1407  HGBA1C 7.4* 7.2*    CBG: Recent Labs  Lab 12/12/20 1215 12/14/20 0250  GLUCAP 226* 147*    Lipid Profile: Recent Labs    12/12/20 0612  CHOL 91  HDL 33*  LDLCALC 38  TRIG 102  CHOLHDL 2.8    Thyroid Function Tests: No results for input(s): TSH, T4TOTAL, FREET4, T3FREE, THYROIDAB in the last 72 hours. Anemia Panel: No results for input(s): VITAMINB12, FOLATE, FERRITIN, TIBC, IRON, RETICCTPCT in the last 72 hours. Sepsis Labs: No results for input(s): PROCALCITON, LATICACIDVEN in the last 168 hours.  Recent Results (from the past 240 hour(s))  Resp Panel by RT-PCR (Flu A&B, Covid) Nasopharyngeal Swab      Status: None   Collection Time: 12/11/20  8:26 PM   Specimen: Nasopharyngeal Swab; Nasopharyngeal(NP) swabs in vial transport medium  Result Value Ref Range Status   SARS Coronavirus 2 by RT PCR NEGATIVE NEGATIVE Final    Comment: (NOTE) SARS-CoV-2 target nucleic acids are NOT DETECTED.  The SARS-CoV-2 RNA is generally detectable in upper respiratory specimens during the acute phase of infection. The lowest concentration of SARS-CoV-2 viral copies this assay can detect is 138 copies/mL. A negative result does not preclude SARS-Cov-2 infection and should not be used as the sole basis for treatment or other patient management decisions. A negative result may occur with  improper specimen collection/handling, submission of specimen other than nasopharyngeal swab, presence of viral mutation(s) within the areas targeted by this assay, and inadequate number of viral copies(<138 copies/mL). A negative result must be combined with clinical observations, patient history, and epidemiological information. The expected result is Negative.  Fact Sheet for Patients:  EntrepreneurPulse.com.au  Fact Sheet for Healthcare Providers:  IncredibleEmployment.be  This test is no t yet approved or cleared by the [place] FDA and  has been authorized for detection and/or diagnosis of SARS-CoV-2 by FDA under an Emergency Use Authorization (EUA). This EUA will remain  in effect (meaning this test can be used) for the duration of the COVID-19 declaration under Section 564(b)(1) of the Act, 21 U.S.C.section 360bbb-3(b)(1), unless the authorization is terminated  or revoked sooner.       Influenza A by PCR NEGATIVE NEGATIVE Final   Influenza B by PCR NEGATIVE NEGATIVE Final    Comment: (NOTE) The Xpert Xpress SARS-CoV-2/FLU/RSV plus assay is intended as an aid in the diagnosis of influenza from Nasopharyngeal swab specimens and should not be used as a sole basis  for treatment. Nasal washings and aspirates are unacceptable for Xpert Xpress SARS-CoV-2/FLU/RSV testing.  Fact Sheet for Patients: EntrepreneurPulse.com.au  Fact Sheet for Healthcare Providers: IncredibleEmployment.be  This test is not yet approved or cleared by the [place] FDA and has been authorized for detection and/or diagnosis of SARS-CoV-2 by FDA under an Emergency Use Authorization (EUA). This EUA will remain in effect (meaning this test can be used) for the duration of the COVID-19 declaration under Section 564(b)(1) of the Act, 21 U.S.C. section 360bbb-3(b)(1), unless the authorization is terminated or revoked.  Performed at [facility]  Lab, [address] [place], [place] [address]   MRSA Next Gen by PCR, Nasal     Status: None   Collection Time: 12/12/20  8:14 PM   Specimen: Nasal Mucosa; Nasal Swab  Result Value Ref Range Status   MRSA by PCR Next Gen NOT DETECTED NOT DETECTED Final    Comment: (NOTE) The GeneXpert MRSA Assay (FDA approved for NASAL specimens only), is one component of a comprehensive MRSA colonization surveillance program. It is not intended to diagnose MRSA infection nor to guide or monitor treatment for MRSA infections. Test performance is not FDA approved in patients less than 46 years old. Performed at [facility], [place], [place] [address]   Respiratory (~20 pathogens) panel by PCR     Status: Abnormal   Collection Time: 12/12/20  8:16 PM   Specimen: Nasopharyngeal Swab; Respiratory  Result Value Ref Range Status   Adenovirus NOT DETECTED NOT DETECTED Final   Coronavirus 229E NOT DETECTED NOT DETECTED Final    Comment: (NOTE) The Coronavirus on the Respiratory Panel, DOES NOT test for the novel  Coronavirus (2019 nCoV)    Coronavirus HKU1 NOT DETECTED NOT DETECTED Final   Coronavirus NL63 NOT DETECTED NOT DETECTED Final   Coronavirus OC43 NOT DETECTED NOT  DETECTED Final   Metapneumovirus NOT DETECTED NOT DETECTED Final   Rhinovirus / Enterovirus NOT DETECTED NOT DETECTED Final   Influenza A NOT DETECTED NOT DETECTED Final   Influenza B NOT DETECTED NOT DETECTED Final   Parainfluenza Virus 1 NOT DETECTED NOT DETECTED Final   Parainfluenza Virus 2 NOT DETECTED NOT DETECTED Final   Parainfluenza Virus 3 NOT DETECTED NOT DETECTED Final   Parainfluenza Virus 4 NOT DETECTED NOT DETECTED Final   Respiratory Syncytial Virus DETECTED (A) NOT DETECTED Final   Bordetella pertussis NOT DETECTED NOT DETECTED Final   Bordetella Parapertussis NOT DETECTED NOT DETECTED Final   Chlamydophila pneumoniae NOT DETECTED NOT DETECTED Final   Mycoplasma pneumoniae NOT DETECTED NOT DETECTED Final    Comment: Performed at [facility], [place]. [address] [place], [place] [address]          Radiology Studies: CARDIAC CATHETERIZATION  Result Date: 12/12/2020 Conclusions: Severe two-vessel coronary artery disease with chronic total occlusions of the mid LAD and ostial LCx, as noted on prior catheterizations. Mild, nonobstructive RCA disease with patent PDA stent. Widely patent LIMA-LAD bypass graft.  SVG-D1 and SVG-OM1 are known to be occluded and were not engaged on today's study. Mildly elevated left ventricular filling pressure (LVEDP 15-20 mmHg) with grossly normal left ventricular contraction. Recommendations: No culprit lesion for patient's elevated troponin identified.  There may be slight worsening of disease involving distal D1 branch.  I suspect his troponin elevation reflects supply-demand mismatch in the setting of respiratory illness and significant fixed CAD.  Continue secondary prevention of coronary artery disease. Maintain net even to slightly negative fluid balance. Further work-up and management of acute respiratory illness per internal medicine.  I do not believe the patient's coronary artery disease and mildly elevated filling pressures explain  his symptoms and worsening hypoxia. Nelva Bush, MD [facility]  DG Chest Portable 1 View  Result Date: 12/12/2020 CLINICAL DATA:  Cold symptoms EXAM: PORTABLE CHEST 1 VIEW COMPARISON:  12/11/2020 FINDINGS: Post sternotomy changes. Cardiomegaly with mild central congestion but no edema, pleural effusion, or focal airspace disease. No pneumothorax. IMPRESSION: Cardiomegaly with mild central congestion. Electronically Signed   By: Donavan Foil M.D.   On:  12/12/2020 15:52   ECHOCARDIOGRAM COMPLETE  Result Date: 12/13/2020    ECHOCARDIOGRAM REPORT   Patient Name:   THIERNO CAPRETTA Date of Exam: 12/13/2020 Medical Rec #:  [id number]             Height:       68.0 in Accession #:    [id number]            Weight:       280.0 lb Date of Birth:  [birth year]             BSA:          2.358 m Patient Age:    52 years              BP:           131/67 mmHg Patient Gender: M                     HR:           88 bpm. Exam Location:  [facility] Procedure: 2D Echo, Cardiac Doppler and Color Doppler Indications:     NSTEMI I21.4  History:         Patient has prior history of Echocardiogram examinations. CHF,                  CAD; Risk Factors:Hypertension.  Sonographer:     Alyse Low Roar Referring Phys:  [place] Diagnosing Phys: Skeet Latch MD IMPRESSIONS  1. Moderate hypertrophy of the basal septum with otherwise mild concentric LVH. Left ventricular ejection fraction, by estimation, is 55 to 60%. The left ventricle has normal function. The left ventricle has no regional wall motion abnormalities. There is moderate left ventricular hypertrophy of the basal-septal segment. Left ventricular diastolic parameters are consistent with Grade II diastolic dysfunction (pseudonormalization). Elevated left ventricular end-diastolic pressure.  2. Right ventricular systolic function is normal. The right ventricular size is normal.  3. The mitral valve is normal in structure. Trivial mitral valve regurgitation.  No evidence of mitral stenosis.  4. The aortic valve is tricuspid. Aortic valve regurgitation is trivial. No aortic stenosis is present.  5. The inferior vena cava is normal in size with greater than 50% respiratory variability, suggesting right atrial pressure of 3 mmHg. FINDINGS  Left Ventricle: Moderate hypertrophy of the basal septum with otherwise mild concentric LVH. Left ventricular ejection fraction, by estimation, is 55 to 60%. The left ventricle has normal function. The left ventricle has no regional wall motion abnormalities. The left ventricular internal cavity size was normal in size. There is moderate left ventricular hypertrophy of the basal-septal segment. Left ventricular diastolic parameters are consistent with Grade II diastolic dysfunction (pseudonormalization). Elevated left ventricular end-diastolic pressure. Right Ventricle: The right ventricular size is normal. No increase in right ventricular wall thickness. Right ventricular systolic function is normal. Left Atrium: Left atrial size was normal in size. Right Atrium: Right atrial size was normal in size. Pericardium: There is no evidence of pericardial effusion. Mitral Valve: The mitral valve is normal in structure. Trivial mitral valve regurgitation. No evidence of mitral valve stenosis. Tricuspid Valve: The tricuspid valve is normal in structure. Tricuspid valve regurgitation is trivial. No evidence of tricuspid stenosis. Aortic Valve: The aortic valve is tricuspid. Aortic valve regurgitation is trivial. No aortic stenosis is present. Aortic valve peak gradient measures 6.2 mmHg. Pulmonic Valve: The pulmonic valve was normal in structure. Pulmonic valve regurgitation is trivial. No evidence of pulmonic stenosis. Aorta: The  aortic root is normal in size and structure. Venous: The inferior vena cava was not well visualized. The inferior vena cava is normal in size with greater than 50% respiratory variability, suggesting right atrial  pressure of 3 mmHg. IAS/Shunts: No atrial level shunt detected by color flow Doppler.  LEFT VENTRICLE PLAX 2D LVIDd:         4.80 cm   Diastology LVIDs:         3.60 cm   LV e' medial:    5.55 cm/s LV PW:         1.20 cm   LV E/e' medial:  17.5 LV IVS:        1.50 cm   LV e' lateral:   13.30 cm/s LVOT diam:     1.80 cm   LV E/e' lateral: 7.3 LVOT Area:     2.54 cm  LEFT ATRIUM           Index LA diam:      3.80 cm 1.61 cm/m LA Vol (A4C): 52.8 ml 22.39 ml/m  AORTIC VALVE                 PULMONIC VALVE AV Area (Vmax): 1.84 cm     PV Vmax:          1.17 m/s AV Vmax:        125.00 cm/s  PV Peak grad:     5.5 mmHg AV Peak Grad:   6.2 mmHg     PR End Diast Vel: 5.29 msec LVOT Vmax:      90.40 cm/s   RVOT Peak grad:   1 mmHg  AORTA Ao Root diam: 2.80 cm MITRAL VALVE               TRICUSPID VALVE MV Area (PHT): 5.31 cm    TR Peak grad:   8.4 mmHg MV Decel Time: 143 msec    TR Vmax:        145.00 cm/s MV E velocity: 97.30 cm/s MV A velocity: 78.80 cm/s  SHUNTS MV E/A ratio:  1.23        Systemic Diam: 1.80 cm MV A Prime:    7.6 cm/s Skeet Latch MD Electronically signed by Skeet Latch MD Signature Date/Time: 12/13/2020/2:58:24 PM    Final         Scheduled Meds:  amLODipine  10 mg Oral Daily   aspirin EC  81 mg Oral Daily   Chlorhexidine Gluconate Cloth  6 each Topical Daily   clopidogrel  75 mg Oral Daily   enoxaparin (LOVENOX) injection  40 mg Subcutaneous Q24H   insulin pump   Subcutaneous TID WC, HS, 0200   ipratropium-albuterol  3 mL Nebulization QID   isosorbide mononitrate  30 mg Oral Daily   methylPREDNISolone (SOLU-MEDROL) injection  80 mg Intravenous Daily   metoprolol succinate  100 mg Oral Daily   pantoprazole  40 mg Oral Daily   rosuvastatin  40 mg Oral Daily   sodium chloride flush  3 mL Intravenous Q12H   Continuous Infusions:  sodium chloride       LOS: 2 days    Time spent:40 min    Nivan Melendrez, Geraldo Docker, MD [facility]   If 7PM-7AM, please contact  night-coverage 12/14/2020, 3:37 PM

## 2020-12-15 ENCOUNTER — Encounter: Payer: Self-pay | Admitting: Internal Medicine

## 2020-12-15 DIAGNOSIS — I2510 Atherosclerotic heart disease of native coronary artery without angina pectoris: Secondary | ICD-10-CM | POA: Diagnosis not present

## 2020-12-15 DIAGNOSIS — R059 Cough, unspecified: Secondary | ICD-10-CM | POA: Diagnosis not present

## 2020-12-15 DIAGNOSIS — I5042 Chronic combined systolic (congestive) and diastolic (congestive) heart failure: Secondary | ICD-10-CM | POA: Diagnosis not present

## 2020-12-15 DIAGNOSIS — I214 Non-ST elevation (NSTEMI) myocardial infarction: Secondary | ICD-10-CM | POA: Diagnosis not present

## 2020-12-15 LAB — CBC WITH DIFFERENTIAL/PLATELET
Abs Immature Granulocytes: 0.15 10*3/uL — ABNORMAL HIGH (ref 0.00–0.07)
Basophils Absolute: 0 10*3/uL (ref 0.0–0.1)
Basophils Relative: 0 %
Eosinophils Absolute: 0 10*3/uL (ref 0.0–0.5)
Eosinophils Relative: 0 %
HCT: 42.8 % (ref 39.0–52.0)
Hemoglobin: 15.4 g/dL (ref 13.0–17.0)
Immature Granulocytes: 1 %
Lymphocytes Relative: 10 %
Lymphs Abs: 1.7 10*3/uL (ref 0.7–4.0)
MCH: 30 pg (ref 26.0–34.0)
MCHC: 36 g/dL (ref 30.0–36.0)
MCV: 83.3 fL (ref 80.0–100.0)
Monocytes Absolute: 1.1 10*3/uL — ABNORMAL HIGH (ref 0.1–1.0)
Monocytes Relative: 6 %
Neutro Abs: 13.9 10*3/uL — ABNORMAL HIGH (ref 1.7–7.7)
Neutrophils Relative %: 83 %
Platelets: 267 10*3/uL (ref 150–400)
RBC: 5.14 MIL/uL (ref 4.22–5.81)
RDW: 12.2 % (ref 11.5–15.5)
WBC: 16.8 10*3/uL — ABNORMAL HIGH (ref 4.0–10.5)
nRBC: 0 % (ref 0.0–0.2)

## 2020-12-15 LAB — PHOSPHORUS: Phosphorus: 3.4 mg/dL (ref 2.5–4.6)

## 2020-12-15 LAB — COMPREHENSIVE METABOLIC PANEL
ALT: 44 U/L (ref 0–44)
AST: 43 U/L — ABNORMAL HIGH (ref 15–41)
Albumin: 4.1 g/dL (ref 3.5–5.0)
Alkaline Phosphatase: 48 U/L (ref 38–126)
Anion gap: 10 (ref 5–15)
BUN: 31 mg/dL — ABNORMAL HIGH (ref 8–23)
CO2: 26 mmol/L (ref 22–32)
Calcium: 9 mg/dL (ref 8.9–10.3)
Chloride: 93 mmol/L — ABNORMAL LOW (ref 98–111)
Creatinine, Ser: 1.42 mg/dL — ABNORMAL HIGH (ref 0.61–1.24)
GFR, Estimated: 55 mL/min — ABNORMAL LOW (ref >60)
Glucose, Bld: 260 mg/dL — ABNORMAL HIGH (ref 70–99)
Potassium: 3.9 mmol/L (ref 3.5–5.1)
Sodium: 129 mmol/L — ABNORMAL LOW (ref 135–145)
Total Bilirubin: 0.9 mg/dL (ref 0.3–1.2)
Total Protein: 7.6 g/dL (ref 6.5–8.1)

## 2020-12-15 LAB — GLUCOSE, CAPILLARY
Glucose-Capillary: 259 mg/dL — ABNORMAL HIGH (ref 70–99)
Glucose-Capillary: 267 mg/dL — ABNORMAL HIGH (ref 70–99)
Glucose-Capillary: 221 mg/dL — ABNORMAL HIGH (ref 70–99)
Glucose-Capillary: 216 mg/dL — ABNORMAL HIGH (ref 70–99)

## 2020-12-15 LAB — MAGNESIUM: Magnesium: 2.9 mg/dL — ABNORMAL HIGH (ref 1.7–2.4)

## 2020-12-15 MED ORDER — IPRATROPIUM-ALBUTEROL 0.5-2.5 (3) MG/3ML IN SOLN
3.0000 mL | Freq: Three times a day (TID) | RESPIRATORY_TRACT | Status: DC
  Administered 2020-12-15 – 2020-12-16 (×4): 3 mL via RESPIRATORY_TRACT
  Filled 2020-12-15 (×4): qty 3

## 2020-12-15 NOTE — Consult Note (Signed)
   Heart Failure Nurse Navigator Note  HFpEF 55 to 60%.  Moderate left ventricular hypertrophy of the basal septal segment.  Grade 2 diastolic dysfunction.  Elevated left ventricular end-diastolic pressure.  Normal right ventricular systolic function.  He presented to the emergency room with complaints of a cough for the last several weeks.  He denied any shortness of breath or chest pain.  He has been diagnosed with RSV.  Comorbidities:  Coronary artery disease with coronary artery bypass grafting Carotid stenosis Hyperlipidemia Hypertension Type 2 diabetes Morbid obesity  Medications:  Norvasc 10 mg daily Aspirin 81 mg daily Plavix 75 mg daily Isosorbide mononitrate 30 mg daily Metoprolol succinate 100 mg daily Potassium chloride 40 mEq 2 times a day Crestor 40 mg daily  He has an allergy to spironolactone.  Labs:  Sodium 129, potassium 3.9, chloride 93, CO2 26, BUN 31, creatinine 1.42, magnesium 2.9, phosphorus 3.4 Weight is 125.3 kg Blood pressure 125/77   Initial meeting with patient he just lying in bed in no acute distress, he continues to be bothered with a bark-like cough and hiccups.  Discussed how he takes care of himself at home.  He states that he does not weigh himself on a daily basis.  Explained the reasoning why that is important to record and to notify his caregivers about any noted weight increase.  Discussed his daily fluid intake and, states he does not go over 64 ounces in fact is closer to 32 ounces.  Discussed his diet, he states that he does not eat fast food any more, he will treat himself to a piece of pizza rarely.  He admits to using a small amount of salt to some food like eggs but does not use it routinely as he knows that most foods contain some amount of sodium.  States over the last few months he has gone from a size 40 to waist trousers up to a size 46, approximately a 30 pound weight gain.  He is wanting to talk to the diabetic  educator.  Discussed some of his medications, plan to follow along while he is hospitalized.  States that he walks his little dog 4-5 times daily.  Discussed follow-up in the outpatient heart failure clinic, he has a appointment on November 11 at 10:30 in the morning.  He has a 2% of no-shows, 1 out of 53 appointments.  Tresa Endo RN [facility]

## 2020-12-15 NOTE — Progress Notes (Signed)
OT Screen Note  Patient Details Name: Danny [name] MRN: [id number] DOB: 06/30/1955   Cancelled Treatment:    Reason Eval/Treat Not Completed: OT screened, no needs identified, will sign off. Order received, chart reviewed. Pt grossly at baseline functional independence for ADL. No skilled OT needs identified. Will sign off. Please re-consult if additional needs arise.   Arman Filter., MPH, MS, OTR/L ascom [phone] 12/15/20, 4:47 PM

## 2020-12-15 NOTE — Evaluation (Addendum)
Physical Therapy Evaluation Patient Details Name: Torri Langston MRN: [id number] DOB: [birth year] Today's Date: 12/15/2020  History of Present Illness  Pt is a 65 y.o. M arriving to ED c/o cough, nausea, vomiting and admitted for NSTEMI. L Cardiac cath & angiography performed on 12/12/20. PMH includes s/p CABG, chronic CHF, HTN, DM2, CKD, CAD.  Clinical Impression  Pt seated in bed, very enthusiastic to get out of bed and move. Pt states PLOF as indep and ambulates without AD, currently drives, and will have intermittent support if returning home. Pt required very little assistance and appears to be close to baseline functionally. Ambulated ~ 360 ft on room air, SpO2 ~ 90-91% with one standing rest break w/ SUPV. Pt performed DGI with good overall balance but does note a very slight instability requiring a change in gait speed (not noted during DGI). No further PT needs at this time, PT to sign off. Pt will be placed on mobility tech's list to maintain current level of functional ability.     Recommendations for follow up therapy are one component of a multi-disciplinary discharge planning process, led by the attending physician.  Recommendations may be updated based on patient status, additional functional criteria and insurance authorization.  Follow Up Recommendations No PT follow up    Assistance Recommended at Discharge Set up Supervision/Assistance  Functional Status Assessment Patient has not had a recent decline in their functional status  Equipment Recommendations  None recommended by PT    Recommendations for Other Services       Precautions / Restrictions Precautions Precautions: Fall Restrictions Weight Bearing Restrictions: No      Mobility  Bed Mobility Overal bed mobility: Modified Independent             General bed mobility comments: HOB elevated    Transfers Overall transfer level: Modified independent Equipment used: None                     Ambulation/Gait Ambulation/Gait assistance: Supervision Gait Distance (Feet): 360 Feet Assistive device: None Gait Pattern/deviations: Step-through pattern     General Gait Details: O2 > 90% throughout mobility, slight instabilitly x 2 without LOB corrects easily with ankle strategy  Stairs            Wheelchair Mobility    Modified Rankin (Stroke Patients Only)       Balance Overall balance assessment: Needs assistance Sitting-balance support: Feet supported Sitting balance-Leahy Scale: Normal     Standing balance support: During functional activity Standing balance-Leahy Scale: Good Standing balance comment: slight instability during amb                 Standardized Balance Assessment Standardized Balance Assessment : Dynamic Gait Index   Dynamic Gait Index Level Surface: Normal Change in Gait Speed: Normal Gait with Horizontal Head Turns: Normal Gait with Vertical Head Turns: Mild Impairment Gait and Pivot Turn: Mild Impairment Step Over Obstacle: Normal Step Around Obstacles: Normal Steps: Normal Total Score: 22       Pertinent Vitals/Pain Pain Assessment: No/denies pain    Home Living Family/patient expects to be discharged to:: Private residence Living Arrangements: Spouse/significant other Available Help at Discharge: Family;Available PRN/intermittently (wife works during the day) Type of Home: House Home Access: Stairs to enter Entrance Stairs-Rails: Right Entrance Stairs-Number of Steps: 8   Home Layout: One level Home Equipment: BSC;Grab bars - toilet      Prior Function Prior Level of Function : Independent/Modified Independent  Mobility Comments: Walks without AD for community and household distances ADLs Comments: Drives, cooks, currently does not work 2/2 to disability status     Higher education careers adviser        Extremity/Trunk Assessment   Upper Extremity Assessment Upper Extremity Assessment: Overall WFL for  tasks assessed (5/5 MMT bicep, tricep, R grip stronger than L)    Lower Extremity Assessment Lower Extremity Assessment: Overall WFL for tasks assessed (MMT 5/5 bilat quad, hamstring; SILT BLE)    Cervical / Trunk Assessment Cervical / Trunk Assessment: Normal  Communication   Communication: No difficulties  Cognition Arousal/Alertness: Awake/alert Behavior During Therapy: WFL for tasks assessed/performed Overall Cognitive Status: Within Functional Limits for tasks assessed                                 General Comments: Oriented x 4, energetic and wants to get out of hospital room therefore enthusiastic for mobility        General Comments      Exercises     Assessment/Plan    PT Assessment Patient does not need any further PT services  PT Problem List         PT Treatment Interventions      PT Goals (Current goals can be found in the Care Plan section)       Frequency     Barriers to discharge        Co-evaluation               AM-PAC PT "6 Clicks" Mobility  Outcome Measure Help needed turning from your back to your side while in a flat bed without using bedrails?: None Help needed moving from lying on your back to sitting on the side of a flat bed without using bedrails?: None Help needed moving to and from a bed to a chair (including a wheelchair)?: None Help needed standing up from a chair using your arms (e.g., wheelchair or bedside chair)?: None Help needed to walk in hospital room?: A Little Help needed climbing 3-5 steps with a railing? : A Little 6 Click Score: 22    End of Session Equipment Utilized During Treatment: Gait belt Activity Tolerance: Patient tolerated treatment well Patient left: in chair;with call bell/phone within reach;with chair alarm set Nurse Communication: Mobility status PT Visit Diagnosis: Muscle weakness (generalized) (M62.81)    Time: 2505-3976 PT Time Calculation (min) (ACUTE ONLY): 31  min   Charges:   PT Evaluation $PT Eval Low Complexity: 1 Low PT Treatments $Therapeutic Activity: 23-37 mins       [employer], SPT

## 2020-12-15 NOTE — Plan of Care (Signed)

## 2020-12-15 NOTE — Progress Notes (Signed)
Inpatient Diabetes Program Recommendations  AACE/ADA: New Consensus Statement on Inpatient Glycemic Control (2015)  Target Ranges:  Prepandial:   less than 140 mg/dL      Peak postprandial:   less than 180 mg/dL (1-2 hours)      Critically ill patients:  140 - 180 mg/dL  Results for Danny [name] (MRN [id number]) as of 12/15/2020 14:15  Ref. Range 12/15/2020 07:50 12/15/2020 11:53  Glucose-Capillary Latest Ref Range: 70 - 99 mg/dL 259 (H) 267 (H)     Home DM Meds: Insulin Pump Tslim with Humalog U200 Insulin and Dexcom CGM  Current Orders: Insulin Pump TID AC + HS + 2am   Sent [place] to RN caring for pt today reminding them to check fingerstick CBGs with the hospital meter and make sure CBGs are downloaded to the chart.  Also requested RN to complete Insulin pump assessment Qshift under flowsheets.  Met w/ pt at bedside this afternoon.  Pt A&O and appropriate to use home insulin pump.  Pump is currently in Tandem IQ mode: Basal rates Midnight = 3.0 12 PM = 3.0 TDD basal: 72.0 units   Bolus settings I/C: 10 ISF: 28 Target Glucose: 110 Active insulin time: 4 hours  Above pump rates verified with pt.  Insertion site os currently on LU abdomen.  Pt told me he only changes his insertion site Q7 days.  Discussed with pt the necessity to change his insertion site every 3 days as recommended by the manufacturer to prevent infection and insertion site issues.  Pt stated he understood and only changes every 7 days to help save money--states Dr. O'Connell knows and did not say anything further to him about this.  Pt open to changing more frequently but would prefer to keep his current schedule of every 7 days.  Current site changed 10/29--insulin refilled that day as well.  Has 1 extra set of supplies at bedside but wife can bring more if needed.  Did tell me he makes sure to rotate both his pump insertion site and the sensor site.    We discussed how pt's CBGs have been running higher and  will likely continue to run higher while he is getting steroids.  Explained to pt that Solumedrol was stopped yest AM and pt now getting Prednisone daily.  Explained how Prednisone can lead to elevated CBGs (especially in the afternoon) and how pt may need to give more insulin with meals (temporarily while getting the Prednisone--may need to increase his ISF or give slightly more carb coverage).  Encouraged pt to call his ENDO Dr. O'Connell to discuss this and see what Dr. O'Connell would like him to do about his bolus settings while getting the Prednisone.  Pt acknowledged and agreeable to this plan.   --Will follow patient during hospitalization--  Danny Johnston Fishel RN, MSN, CDE Diabetes Coordinator Inpatient Glycemic Control Team Team Pager: [phone] (8a-5p)     

## 2020-12-15 NOTE — Progress Notes (Signed)
PROGRESS NOTE    Danny [name]  [id number] DOB: 07/29/1955 DOA: 12/11/2020 PCP: Toni Arthurs, NP     Brief Narrative:  65 y.o. WM PMHx HTN, Chronic combined Systolic & Diastolic CHF, ischemic cardiomyopathy (EF 45 to 50%), CAD, HLD, DM type II uncontrolled with hyperglycemia.  Presents with complaints of cough for past few weeks.  Patient complains of nausea vomiting cough no shortness of breath no chest pain no palpitations no headaches blurred vision speech or gait issues, abdomen is distended nontender bowel sounds are within normal limits on exam.  Patient denies any other complaints.     Subjective: 10/31 afebrile overnight A/O x4.  Continued productive cough   Assessment & Plan: Covid vaccination; vaccinated 3/3   Principal Problem:   Cough Active Problems:   S/P CABG x 3   Chronic combined systolic and diastolic congestive heart failure (HCC)   Ischemic cardiomyopathy   Essential hypertension   Hyperlipidemia   Coronary artery disease involving native coronary artery of native heart without angina pectoris   Hyponatremia   Hypokalemia   Non-ST elevation (NSTEMI) myocardial infarction (HCC)   Severe obesity (BMI >= 40) (HCC)   RSV (respiratory syncytial virus pneumonia)  NSTEMI (non-ST elevated myocardial infarction) (HCC)-s/p CABG x3   Chronic combined systolic and diastolic congestive heart failure (HCC)   Coronary artery disease involving native coronary artery of native heart without angina pectoris -10/28 cardiology consult  -10/28 heart catheterization no significant change see results below -Strict ins and out -1.9 L (10/30) - Daily weight Filed Weights   12/11/20 1732 12/15/20 0500  Weight: 127 kg 125.3 kg       ICM -See NSTEMI - 10/29 echocardiogram pending  CAD - See NSTEMI  RSV Pneumonia -SARS coronavirus negative - 10/28 we will obtain respiratory virus panel, given that RSV is endemic - Continue Mucinex-codeine PRN cough -  10/28 CTA PE protocol negative - DuoNeb QID - 10/30 change Solu-Medrol 80 mg daily---> Prednisone 50 mg daily - Flutter valve - Incentive spirometry -10/31 Ambulatory SPO2 pending  Hyponatremia - Hold diuretics - Normal saline bolus 500 mill x1 - Repeat BMP@2000   Hypokalemia: -Potassium goal> 4 - K-Dur 40 meq BID x 3 doses  Severe obesity (BMI 42.57 kg/m) -    DVT prophylaxis: Lovenox Code Status: Full Family Communication:  Status is: Inpatient    Dispo: The patient is from:               Anticipated d/c is to: Home              Anticipated d/c date is: 2 days              Patient currently is not medically stable to d/c.      Consultants:  Cardiology  Procedures/Significant Events:  10/28 Cardiac Catheterization Severe two-vessel coronary artery disease with chronic total occlusions of the mid LAD and ostial LCx, as noted on prior catheterizations. Mild, nonobstructive RCA disease with patent PDA stent. Widely patent LIMA-LAD bypass graft.  SVG-D1 and SVG-OM1 are known to be occluded and were not engaged on today's study. Mildly elevated left ventricular filling pressure (LVEDP 15-20 mmHg) with grossly normal left ventricular contraction.  10/29 echocardiogram Left Ventricle: Moderate hypertrophy of the basal septum with otherwise  mild concentric LVH.  -LVEF =55 to 60%. The left ventricle has normal function. The left ventricle has  no regional wall motion abnormalities. Moderate LVH of the basal-septal segment.  -Grade II diastolic dysfunction  (pseudonormalization). Elevated  left ventricular end-diastolic pressure.      I have personally reviewed and interpreted all radiology studies and my findings are as above.  VENTILATOR SETTINGS: Room air 10/31 SPO2 95%   Cultures   Antimicrobials:    Devices    LINES / TUBES:      Continuous Infusions:  sodium chloride       Objective: Vitals:   12/15/20 0642 12/15/20 0644 12/15/20 0751  12/15/20 1156  BP:  134/72 (!) 143/65 125/77  Pulse: 87  89 82  Resp: 18  20 18   Temp: 98 F (36.7 C)  98 F (36.7 C) 97.8 F (36.6 C)  TempSrc: Oral  Oral Axillary  SpO2: 92%  94% 96%  Weight:      Height:        Intake/Output Summary (Last 24 hours) at 12/15/2020 1412 Last data filed at 12/15/2020 0220 Gross per 24 hour  Intake --  Output 1800 ml  Net -1800 ml    Filed Weights   12/11/20 1732 12/15/20 0500  Weight: 127 kg 125.3 kg    Examination:  General: A/O x4, No acute respiratory distress Eyes: negative scleral hemorrhage, negative anisocoria, negative icterus ENT: Negative Runny nose, negative gingival bleeding, Neck:  Negative scars, masses, torticollis, lymphadenopathy, JVD Lungs: decreased air movement bilaterally (improved from 10/30), positive expiratory wheezes positive nonproductive cough Cardiovascular: Regular rate and rhythm without murmur gallop or rub normal S1 and S2, positive substernal chest pain Abdomen: negative abdominal pain, nondistended, positive soft, bowel sounds, no rebound, no ascites, no appreciable mass Extremities: No significant cyanosis, clubbing, +1 bilateral lower extremity edema to knees Skin: Negative rashes, lesions, ulcers Psychiatric:  Negative depression, negative anxiety, negative fatigue, negative mania  Central nervous system:  Cranial nerves II through XII intact, tongue/uvula midline, all extremities muscle strength 5/5, sensation intact throughout, negative dysarthria, negative expressive aphasia, negative receptive aphasia.  .     Data Reviewed: Care during the described time interval was provided by me .  I have reviewed this patient's available data, including medical history, events of note, physical examination, and all test results as part of my evaluation.  CBC: Recent Labs  Lab 12/11/20 2026 12/12/20 0612 12/12/20 0831 12/13/20 0523 12/14/20 0457 12/15/20 0516  WBC 11.5* 11.6* 11.3* 10.5 8.5 16.8*   NEUTROABS 9.2*  --  8.6* 7.7 5.7 13.9*  HGB 16.1 16.1 17.0 15.6 15.2 15.4  HCT 43.3 RESULTS UNAVAILABLE DUE TO INTERFERING SUBSTANCE 45.3 42.4 40.5 42.8  MCV 80.3 RESULTS UNAVAILABLE DUE TO INTERFERING SUBSTANCE 81.3 82.3 83.7 83.3  PLT 220 237 244 241 205 267    Basic Metabolic Panel: Recent Labs  Lab 12/12/20 0831 12/12/20 1945 12/13/20 0523 12/14/20 0457 12/15/20 0516  NA 123* 120* 123* 127* 129*  K 2.7* 3.4* 3.2* 3.0* 3.9  CL 84* 85* 87* 90* 93*  CO2 30 25 27 28 26   GLUCOSE 115* 222* 97 104* 260*  BUN 20 24* 28* 26* 31*  CREATININE 1.17 1.36* 1.49* 1.28* 1.42*  CALCIUM 8.7* 8.2* 8.5* 8.6* 9.0  MG 2.3  --  2.4 2.6* 2.9*  PHOS 3.1  --  2.9 4.2 3.4    GFR: Estimated Creatinine Clearance: 67.8 mL/min (A) (by C-G formula based on SCr of 1.42 mg/dL (H)). Liver Function Tests: Recent Labs  Lab 12/11/20 2026 12/12/20 0831 12/13/20 0523 12/14/20 0457 12/15/20 0516  AST 63* 63* 54* 47* 43*  ALT 56* 50* 41 37 44  ALKPHOS 47 50 43 44 48  BILITOT  1.7* 1.4* 1.1 1.1 0.9  PROT 7.9 7.5 7.2 7.1 7.6  ALBUMIN 4.3 4.3 4.2 3.6 4.1    Recent Labs  Lab 12/11/20 2026  LIPASE 22    No results for input(s): AMMONIA in the last 168 hours. Coagulation Profile: Recent Labs  Lab 12/11/20 2229  INR 1.0    Cardiac Enzymes: No results for input(s): CKTOTAL, CKMB, CKMBINDEX, TROPONINI in the last 168 hours. BNP (last 3 results) Recent Labs    08/08/20 1258  PROBNP 141    HbA1C: Recent Labs    12/13/20 1407  HGBA1C 7.2*    CBG: Recent Labs  Lab 12/12/20 1215 12/14/20 0250 12/15/20 0750 12/15/20 1153  GLUCAP 226* 147* 259* 267*    Lipid Profile: No results for input(s): CHOL, HDL, LDLCALC, TRIG, CHOLHDL, LDLDIRECT in the last 72 hours.  Thyroid Function Tests: No results for input(s): TSH, T4TOTAL, FREET4, T3FREE, THYROIDAB in the last 72 hours. Anemia Panel: No results for input(s): VITAMINB12, FOLATE, FERRITIN, TIBC, IRON, RETICCTPCT in the last 72  hours. Sepsis Labs: No results for input(s): PROCALCITON, LATICACIDVEN in the last 168 hours.  Recent Results (from the past 240 hour(s))  Resp Panel by RT-PCR (Flu A&B, Covid) Nasopharyngeal Swab     Status: None   Collection Time: 12/11/20  8:26 PM   Specimen: Nasopharyngeal Swab; Nasopharyngeal(NP) swabs in vial transport medium  Result Value Ref Range Status   SARS Coronavirus 2 by RT PCR NEGATIVE NEGATIVE Final    Comment: (NOTE) SARS-CoV-2 target nucleic acids are NOT DETECTED.  The SARS-CoV-2 RNA is generally detectable in upper respiratory specimens during the acute phase of infection. The lowest concentration of SARS-CoV-2 viral copies this assay can detect is 138 copies/mL. A negative result does not preclude SARS-Cov-2 infection and should not be used as the sole basis for treatment or other patient management decisions. A negative result may occur with  improper specimen collection/handling, submission of specimen other than nasopharyngeal swab, presence of viral mutation(s) within the areas targeted by this assay, and inadequate number of viral copies(<138 copies/mL). A negative result must be combined with clinical observations, patient history, and epidemiological information. The expected result is Negative.  Fact Sheet for Patients:  EntrepreneurPulse.com.au  Fact Sheet for Healthcare Providers:  IncredibleEmployment.be  This test is no t yet approved or cleared by the [place] FDA and  has been authorized for detection and/or diagnosis of SARS-CoV-2 by FDA under an Emergency Use Authorization (EUA). This EUA will remain  in effect (meaning this test can be used) for the duration of the COVID-19 declaration under Section 564(b)(1) of the Act, 21 U.S.C.section 360bbb-3(b)(1), unless the authorization is terminated  or revoked sooner.       Influenza A by PCR NEGATIVE NEGATIVE Final   Influenza B by PCR NEGATIVE  NEGATIVE Final    Comment: (NOTE) The Xpert Xpress SARS-CoV-2/FLU/RSV plus assay is intended as an aid in the diagnosis of influenza from Nasopharyngeal swab specimens and should not be used as a sole basis for treatment. Nasal washings and aspirates are unacceptable for Xpert Xpress SARS-CoV-2/FLU/RSV testing.  Fact Sheet for Patients: EntrepreneurPulse.com.au  Fact Sheet for Healthcare Providers: IncredibleEmployment.be  This test is not yet approved or cleared by the [place] FDA and has been authorized for detection and/or diagnosis of SARS-CoV-2 by FDA under an Emergency Use Authorization (EUA). This EUA will remain in effect (meaning this test can be used) for the duration of the COVID-19 declaration under Section 564(b)(1) of the Act,  21 U.S.C. section 360bbb-3(b)(1), unless the authorization is terminated or revoked.  Performed at [facility], [address] [place], [place] [address]   MRSA Next Gen by PCR, Nasal     Status: None   Collection Time: 12/12/20  8:14 PM   Specimen: Nasal Mucosa; Nasal Swab  Result Value Ref Range Status   MRSA by PCR Next Gen NOT DETECTED NOT DETECTED Final    Comment: (NOTE) The GeneXpert MRSA Assay (FDA approved for NASAL specimens only), is one component of a comprehensive MRSA colonization surveillance program. It is not intended to diagnose MRSA infection nor to guide or monitor treatment for MRSA infections. Test performance is not FDA approved in patients less than 14 years old. Performed at [facility], [place], [place] [address]   Respiratory (~20 pathogens) panel by PCR     Status: Abnormal   Collection Time: 12/12/20  8:16 PM   Specimen: Nasopharyngeal Swab; Respiratory  Result Value Ref Range Status   Adenovirus NOT DETECTED NOT DETECTED Final   Coronavirus 229E NOT DETECTED NOT DETECTED Final    Comment: (NOTE) The Coronavirus on the  Respiratory Panel, DOES NOT test for the novel  Coronavirus (2019 nCoV)    Coronavirus HKU1 NOT DETECTED NOT DETECTED Final   Coronavirus NL63 NOT DETECTED NOT DETECTED Final   Coronavirus OC43 NOT DETECTED NOT DETECTED Final   Metapneumovirus NOT DETECTED NOT DETECTED Final   Rhinovirus / Enterovirus NOT DETECTED NOT DETECTED Final   Influenza A NOT DETECTED NOT DETECTED Final   Influenza B NOT DETECTED NOT DETECTED Final   Parainfluenza Virus 1 NOT DETECTED NOT DETECTED Final   Parainfluenza Virus 2 NOT DETECTED NOT DETECTED Final   Parainfluenza Virus 3 NOT DETECTED NOT DETECTED Final   Parainfluenza Virus 4 NOT DETECTED NOT DETECTED Final   Respiratory Syncytial Virus DETECTED (A) NOT DETECTED Final   Bordetella pertussis NOT DETECTED NOT DETECTED Final   Bordetella Parapertussis NOT DETECTED NOT DETECTED Final   Chlamydophila pneumoniae NOT DETECTED NOT DETECTED Final   Mycoplasma pneumoniae NOT DETECTED NOT DETECTED Final    Comment: Performed at [facility], [place]. [address], [place], [place] [address]          Radiology Studies: No results found.      Scheduled Meds:  amLODipine  10 mg Oral Daily   aspirin EC  81 mg Oral Daily   Chlorhexidine Gluconate Cloth  6 each Topical Daily   clopidogrel  75 mg Oral Daily   enoxaparin (LOVENOX) injection  40 mg Subcutaneous Q24H   insulin pump   Subcutaneous TID WC, HS, 0200   ipratropium-albuterol  3 mL Nebulization TID   isosorbide mononitrate  30 mg Oral Daily   metoprolol succinate  100 mg Oral Daily   pantoprazole  40 mg Oral Daily   potassium chloride  40 mEq Oral BID   predniSONE  50 mg Oral Q breakfast   rosuvastatin  40 mg Oral Daily   sodium chloride flush  3 mL Intravenous Q12H   Continuous Infusions:  sodium chloride       LOS: 3 days    Time spent:40 min    Aki Burdin, Geraldo Docker, MD [facility]   If 7PM-7AM, please contact night-coverage 12/15/2020, 2:12 PM

## 2020-12-16 DIAGNOSIS — I10 Essential (primary) hypertension: Secondary | ICD-10-CM | POA: Diagnosis not present

## 2020-12-16 DIAGNOSIS — I2510 Atherosclerotic heart disease of native coronary artery without angina pectoris: Secondary | ICD-10-CM | POA: Diagnosis not present

## 2020-12-16 DIAGNOSIS — R059 Cough, unspecified: Secondary | ICD-10-CM | POA: Diagnosis not present

## 2020-12-16 DIAGNOSIS — R531 Weakness: Secondary | ICD-10-CM

## 2020-12-16 DIAGNOSIS — E7800 Pure hypercholesterolemia, unspecified: Secondary | ICD-10-CM | POA: Diagnosis not present

## 2020-12-16 LAB — COMPREHENSIVE METABOLIC PANEL
ALT: 42 U/L (ref 0–44)
AST: 36 U/L (ref 15–41)
Albumin: 3.6 g/dL (ref 3.5–5.0)
Alkaline Phosphatase: 46 U/L (ref 38–126)
Anion gap: 4 — ABNORMAL LOW (ref 5–15)
BUN: 31 mg/dL — ABNORMAL HIGH (ref 8–23)
CO2: 30 mmol/L (ref 22–32)
Calcium: 9.1 mg/dL (ref 8.9–10.3)
Chloride: 100 mmol/L (ref 98–111)
Creatinine, Ser: 1.15 mg/dL (ref 0.61–1.24)
GFR, Estimated: >60 mL/min (ref >60)
Glucose, Bld: 89 mg/dL (ref 70–99)
Potassium: 4.5 mmol/L (ref 3.5–5.1)
Sodium: 134 mmol/L — ABNORMAL LOW (ref 135–145)
Total Bilirubin: 1 mg/dL (ref 0.3–1.2)
Total Protein: 6.9 g/dL (ref 6.5–8.1)

## 2020-12-16 LAB — CBC WITH DIFFERENTIAL/PLATELET
Abs Immature Granulocytes: 0.17 10*3/uL — ABNORMAL HIGH (ref 0.00–0.07)
Basophils Absolute: 0 10*3/uL (ref 0.0–0.1)
Basophils Relative: 0 %
Eosinophils Absolute: 0 10*3/uL (ref 0.0–0.5)
Eosinophils Relative: 0 %
HCT: 41.5 % (ref 39.0–52.0)
Hemoglobin: 15 g/dL (ref 13.0–17.0)
Immature Granulocytes: 1 %
Lymphocytes Relative: 18 %
Lymphs Abs: 2.5 10*3/uL (ref 0.7–4.0)
MCH: 31.1 pg (ref 26.0–34.0)
MCHC: 36.1 g/dL — ABNORMAL HIGH (ref 30.0–36.0)
MCV: 85.9 fL (ref 80.0–100.0)
Monocytes Absolute: 1.3 10*3/uL — ABNORMAL HIGH (ref 0.1–1.0)
Monocytes Relative: 9 %
Neutro Abs: 9.8 10*3/uL — ABNORMAL HIGH (ref 1.7–7.7)
Neutrophils Relative %: 72 %
Platelets: 270 10*3/uL (ref 150–400)
RBC: 4.83 MIL/uL (ref 4.22–5.81)
RDW: 12.8 % (ref 11.5–15.5)
WBC: 13.8 10*3/uL — ABNORMAL HIGH (ref 4.0–10.5)
nRBC: 0 % (ref 0.0–0.2)

## 2020-12-16 LAB — GLUCOSE, CAPILLARY
Glucose-Capillary: 140 mg/dL — ABNORMAL HIGH (ref 70–99)
Glucose-Capillary: 71 mg/dL (ref 70–99)
Glucose-Capillary: 172 mg/dL — ABNORMAL HIGH (ref 70–99)
Glucose-Capillary: 96 mg/dL (ref 70–99)

## 2020-12-16 LAB — PHOSPHORUS: Phosphorus: 3.7 mg/dL (ref 2.5–4.6)

## 2020-12-16 LAB — MAGNESIUM: Magnesium: 2.7 mg/dL — ABNORMAL HIGH (ref 1.7–2.4)

## 2020-12-16 MED ORDER — IPRATROPIUM-ALBUTEROL 0.5-2.5 (3) MG/3ML IN SOLN: 3.0000 mL | RESPIRATORY_TRACT | 0 refills | Status: AC | PRN

## 2020-12-16 MED ORDER — PREDNISONE 50 MG PO TABS: ORAL_TABLET | ORAL | 0 refills | Status: DC

## 2020-12-16 MED ORDER — GUAIFENESIN-CODEINE 100-10 MG/5ML PO SOLN: 10.0000 mL | ORAL | 0 refills | Status: DC | PRN

## 2020-12-16 NOTE — Discharge Summary (Signed)
Physician Discharge Summary  Danny [name] [id number] DOB: 12-07-1955 DOA: 12/11/2020  PCP: Toni Arthurs, NP  Admit date: 12/11/2020 Discharge date: 12/16/2020  Time spent: 35 minutes  Recommendations for Outpatient Follow-up:   NSTEMI (non-ST elevated myocardial infarction) (HCC)-s/p CABG x3   Chronic combined systolic and diastolic congestive heart failure ([place])   Coronary artery disease involving native coronary artery of native heart without angina pectoris -10/28 cardiology consult  -10/28 heart catheterization no significant change see results below -Strict ins and out -1.9 L (10/30) - Daily weight Filed Weights   12/11/20 1732 12/15/20 0500  Weight: 127 kg 125.3 kg  -Ruled out  HFrEF secondary to ICM: -Subsequent normalization of LVSF -See NSTEMI - 10/29 echocardiogram: See results below - Follow-up CHF clinic 11/11@1030    CAD - See NSTEMI   RSV Pneumonia -SARS coronavirus negative - 10/28 we will obtain respiratory virus panel, given that RSV is endemic - Continue Mucinex-codeine PRN cough - 10/28 CTA PE protocol negative - DuoNeb QID - 10/30 change Solu-Medrol 80 mg daily---> Prednisone 50 mg daily - Flutter valve - Incentive spirometry -Patient does not meet criteria for home O2 Patient Saturations on Room Air at Rest = 98% Patient Saturations on Room Air while Ambulating = 90% Patient able to ambulate 360 feet on room air and maintain O2 sats at or above 90%   Hyponatremia - Essentially at baseline   Hypokalemia: -Potassium goal> 4   Severe obesity (BMI 42.57 kg/m) -  Discharge Diagnoses:  Principal Problem:   Cough Active Problems:   S/P CABG x 3   Chronic combined systolic and diastolic congestive heart failure (HCC)   Ischemic cardiomyopathy   Essential hypertension   Hyperlipidemia   Coronary artery disease involving native coronary artery of native heart without angina pectoris   Hyponatremia   Hypokalemia   Non-ST  elevation (NSTEMI) myocardial infarction (HCC)   Severe obesity (BMI >= 40) (HCC)   RSV (respiratory syncytial virus pneumonia)   Discharge Condition: Stable  Diet recommendation: Heart healthy  Filed Weights   12/11/20 1732 12/15/20 0500  Weight: 127 kg 125.3 kg    History of present illness:  65 y.o. WM PMHx HTN, Chronic combined Systolic & Diastolic CHF, ischemic cardiomyopathy (EF 45 to 50%), CAD, HLD, DM type II uncontrolled with hyperglycemia.   Presents with complaints of cough for past few weeks.  Patient complains of nausea vomiting cough no shortness of breath no chest pain no palpitations no headaches blurred vision speech or gait issues, abdomen is distended nontender bowel sounds are within normal limits on exam.  Patient denies any other complaints.  Hospital Course:  See above  Procedures: 10/28 Cardiac Catheterization Severe two-vessel coronary artery disease with chronic total occlusions of the mid LAD and ostial LCx, as noted on prior catheterizations. Mild, nonobstructive RCA disease with patent PDA stent. Widely patent LIMA-LAD bypass graft.  SVG-D1 and SVG-OM1 are known to be occluded and were not engaged on today's study. Mildly elevated left ventricular filling pressure (LVEDP 15-20 mmHg) with grossly normal left ventricular contraction.   10/29 echocardiogram Left Ventricle: Moderate hypertrophy of the basal septum with otherwise  mild concentric LVH.  -LVEF =55 to 60%. The left ventricle has normal function. The left ventricle has  no regional wall motion abnormalities. Moderate LVH of the basal-septal segment.  -Grade II diastolic dysfunction  (pseudonormalization). Elevated left ventricular end-diastolic pressure.  Consultations: Cardiology  Cultures  10/28 respiratory virus panel positive RSV     Discharge Exam: Vitals:  12/16/20 0628 12/16/20 0729 12/16/20 1154 12/16/20 1618  BP: (!) 163/79 (!) 155/75 (!) 145/72 (!) 111/97  Pulse: 88 85  82 77  Resp: 20 18 16 18   Temp: 98 F (36.7 C) 97.9 F (36.6 C) 98.6 F (37 C) 98.6 F (37 C)  TempSrc: Oral Oral Oral Oral  SpO2: 94% 94% 94% 94%  Weight:      Height:        General: A/O x4, No acute respiratory distress Eyes: negative scleral hemorrhage, negative anisocoria, negative icterus ENT: Negative Runny nose, negative gingival bleeding, Neck:  Negative scars, masses, torticollis, lymphadenopathy, JVD Lungs: decreased air movement bilaterally (improved from 10/31), positive expiratory wheezes (mild) positive nonproductive cough  Discharge Instructions   Allergies as of 12/16/2020       Reactions   Spironolactone Other (See Comments)   Other reaction(s): Unknown Increased creatinine Increased creatinine        Medication List     STOP taking these medications    esomeprazole 40 MG capsule Commonly known as: NexIUM   furosemide 40 MG tablet Commonly known as: LASIX   metolazone 2.5 MG tablet Commonly known as: ZAROXOLYN   Olmesartan-amLODIPine-HCTZ 40-5-25 MG Tabs   potassium chloride 10 MEQ CR capsule Commonly known as: MICRO-K       TAKE these medications    aspirin EC 81 MG tablet Take 1 tablet (81 mg total) by mouth daily.   clopidogrel 75 MG tablet Commonly known as: PLAVIX TAKE 1 TABLET BY MOUTH DAILY. PLEASE KEEP UPCOMING APPT IN JUNE 2022 BEFORE ANYMORE REFILLS.   [employer] Dry Eye Relief 0.2-0.2-1 % Soln Generic drug: Glycerin-Hypromellose-PEG 400 Place 1 drop into both eyes daily as needed (dry eyes).   guaiFENesin-codeine 100-10 MG/5ML syrup Take 10 mLs by mouth every 4 (four) hours as needed for cough.   HumaLOG KwikPen 200 UNIT/ML KwikPen Generic drug: insulin lispro Inject 206 Units into the skin as directed. (Administered via insulin pump)   ipratropium 0.03 % nasal spray Commonly known as: ATROVENT Place 2 sprays into both nostrils daily as needed for rhinitis.   ipratropium-albuterol 0.5-2.5 (3) MG/3ML Soln Commonly  known as: DUONEB Take 3 mLs by nebulization every 2 (two) hours as needed.   isosorbide mononitrate 30 MG 24 hr tablet Commonly known as: IMDUR Take 1 tablet (30 mg total) by mouth daily.   metoprolol succinate 100 MG 24 hr tablet Commonly known as: TOPROL-XL TAKE 1 TABLET BY MOUTH DAILY. TAKE WITH OR IMMEDIATELY FOLLOWING A MEAL.   nitroGLYCERIN 0.4 MG SL tablet Commonly known as: Nitrostat Place 1 tablet (0.4 mg total) under the tongue every 5 (five) minutes as needed for chest pain.   pantoprazole 40 MG tablet Commonly known as: PROTONIX Take 40 mg by mouth daily.   predniSONE 50 MG tablet Commonly known as: DELTASONE 1 tablet p.o. at breakfast daily Start taking on: December 17, 2020   rosuvastatin 40 MG tablet Commonly known as: CRESTOR TAKE 1 TABLET BY MOUTH EVERY DAY       Allergies  Allergen Reactions   Spironolactone Other (See Comments)    Other reaction(s): Unknown Increased creatinine Increased creatinine      The results of significant diagnostics from this hospitalization (including imaging, microbiology, ancillary and laboratory) are listed below for reference.    Significant Diagnostic Studies: DG Chest 2 View  Result Date: 12/11/2020 CLINICAL DATA:  Cough EXAM: CHEST - 2 VIEW COMPARISON:  05/13/2014 FINDINGS: Prior CABG. Heart and mediastinal contours are within normal limits.  No focal opacities or effusions. No acute bony abnormality. IMPRESSION: No active cardiopulmonary disease. Electronically Signed   By: Rolm Baptise M.D.   On: 12/11/2020 18:32   CT Angio Chest Pulmonary Embolism (PE) W or WO Contrast  Result Date: 12/12/2020 CLINICAL DATA:  Dizziness and weakness. EXAM: CT ANGIOGRAPHY CHEST WITH CONTRAST TECHNIQUE: Multidetector CT imaging of the chest was performed using the standard protocol during bolus administration of intravenous contrast. Multiplanar CT image reconstructions and MIPs were obtained to evaluate the vascular anatomy.  CONTRAST:  183mL OMNIPAQUE IOHEXOL 350 MG/ML SOLN COMPARISON:  None. FINDINGS: Cardiovascular: Satisfactory opacification of the pulmonary arteries to the segmental level. No evidence of pulmonary embolism. Mild cardiomegaly with marked severity coronary artery calcification. No pericardial effusion. Mediastinum/Nodes: No enlarged mediastinal, hilar, or axillary lymph nodes. Thyroid gland, trachea, and esophagus demonstrate no significant findings. Lungs/Pleura: Very mild linear atelectasis is seen within the bilateral lower lobes. There is no evidence of acute infiltrate, pleural effusion or pneumothorax. Upper Abdomen: There is a small hiatal hernia. Diffuse fatty infiltration of the liver parenchyma is noted. Musculoskeletal: Multiple sternal wires are seen. Degenerative changes seen throughout the thoracic spine. Review of the MIP images confirms the above findings. IMPRESSION: 1. No evidence of pulmonary embolism or acute infiltrate. 2. Mild cardiomegaly with marked severity coronary artery calcification. 3. Small hiatal hernia. 4. Fatty liver. Electronically Signed   By: Virgina Norfolk M.D.   On: 12/12/2020 01:37   CARDIAC CATHETERIZATION  Result Date: 12/12/2020 Conclusions: Severe two-vessel coronary artery disease with chronic total occlusions of the mid LAD and ostial LCx, as noted on prior catheterizations. Mild, nonobstructive RCA disease with patent PDA stent. Widely patent LIMA-LAD bypass graft.  SVG-D1 and SVG-OM1 are known to be occluded and were not engaged on today's study. Mildly elevated left ventricular filling pressure (LVEDP 15-20 mmHg) with grossly normal left ventricular contraction. Recommendations: No culprit lesion for patient's elevated troponin identified.  There may be slight worsening of disease involving distal D1 branch.  I suspect his troponin elevation reflects supply-demand mismatch in the setting of respiratory illness and significant fixed CAD.  Continue secondary  prevention of coronary artery disease. Maintain net even to slightly negative fluid balance. Further work-up and management of acute respiratory illness per internal medicine.  I do not believe the patient's coronary artery disease and mildly elevated filling pressures explain his symptoms and worsening hypoxia. Nelva Bush, MD [facility]  DG Chest Portable 1 View  Result Date: 12/12/2020 CLINICAL DATA:  Cold symptoms EXAM: PORTABLE CHEST 1 VIEW COMPARISON:  12/11/2020 FINDINGS: Post sternotomy changes. Cardiomegaly with mild central congestion but no edema, pleural effusion, or focal airspace disease. No pneumothorax. IMPRESSION: Cardiomegaly with mild central congestion. Electronically Signed   By: Donavan Foil M.D.   On: 12/12/2020 15:52   ECHOCARDIOGRAM COMPLETE  Result Date: 12/13/2020    ECHOCARDIOGRAM REPORT   Patient Name:   Danny [name] Date of Exam: 12/13/2020 Medical Rec #:  [id number]             Height:       68.0 in Accession #:    [id number]            Weight:       280.0 lb Date of Birth:  November 19, 1955             BSA:          2.358 m Patient Age:    65 years  BP:           131/67 mmHg Patient Gender: M                     HR:           88 bpm. Exam Location:  [facility] Procedure: 2D Echo, Cardiac Doppler and Color Doppler Indications:     NSTEMI I21.4  History:         Patient has prior history of Echocardiogram examinations. CHF,                  CAD; Risk Factors:Hypertension.  Sonographer:     Alyse Low Roar Referring Phys:  [place] Diagnosing Phys: Skeet Latch MD IMPRESSIONS  1. Moderate hypertrophy of the basal septum with otherwise mild concentric LVH. Left ventricular ejection fraction, by estimation, is 55 to 60%. The left ventricle has normal function. The left ventricle has no regional wall motion abnormalities. There is moderate left ventricular hypertrophy of the basal-septal segment. Left ventricular diastolic parameters are consistent  with Grade II diastolic dysfunction (pseudonormalization). Elevated left ventricular end-diastolic pressure.  2. Right ventricular systolic function is normal. The right ventricular size is normal.  3. The mitral valve is normal in structure. Trivial mitral valve regurgitation. No evidence of mitral stenosis.  4. The aortic valve is tricuspid. Aortic valve regurgitation is trivial. No aortic stenosis is present.  5. The inferior vena cava is normal in size with greater than 50% respiratory variability, suggesting right atrial pressure of 3 mmHg. FINDINGS  Left Ventricle: Moderate hypertrophy of the basal septum with otherwise mild concentric LVH. Left ventricular ejection fraction, by estimation, is 55 to 60%. The left ventricle has normal function. The left ventricle has no regional wall motion abnormalities. The left ventricular internal cavity size was normal in size. There is moderate left ventricular hypertrophy of the basal-septal segment. Left ventricular diastolic parameters are consistent with Grade II diastolic dysfunction (pseudonormalization). Elevated left ventricular end-diastolic pressure. Right Ventricle: The right ventricular size is normal. No increase in right ventricular wall thickness. Right ventricular systolic function is normal. Left Atrium: Left atrial size was normal in size. Right Atrium: Right atrial size was normal in size. Pericardium: There is no evidence of pericardial effusion. Mitral Valve: The mitral valve is normal in structure. Trivial mitral valve regurgitation. No evidence of mitral valve stenosis. Tricuspid Valve: The tricuspid valve is normal in structure. Tricuspid valve regurgitation is trivial. No evidence of tricuspid stenosis. Aortic Valve: The aortic valve is tricuspid. Aortic valve regurgitation is trivial. No aortic stenosis is present. Aortic valve peak gradient measures 6.2 mmHg. Pulmonic Valve: The pulmonic valve was normal in structure. Pulmonic valve  regurgitation is trivial. No evidence of pulmonic stenosis. Aorta: The aortic root is normal in size and structure. Venous: The inferior vena cava was not well visualized. The inferior vena cava is normal in size with greater than 50% respiratory variability, suggesting right atrial pressure of 3 mmHg. IAS/Shunts: No atrial level shunt detected by color flow Doppler.  LEFT VENTRICLE PLAX 2D LVIDd:         4.80 cm   Diastology LVIDs:         3.60 cm   LV e' medial:    5.55 cm/s LV PW:         1.20 cm   LV E/e' medial:  17.5 LV IVS:        1.50 cm   LV e' lateral:   13.30 cm/s LVOT  diam:     1.80 cm   LV E/e' lateral: 7.3 LVOT Area:     2.54 cm  LEFT ATRIUM           Index LA diam:      3.80 cm 1.61 cm/m LA Vol (A4C): 52.8 ml 22.39 ml/m  AORTIC VALVE                 PULMONIC VALVE AV Area (Vmax): 1.84 cm     PV Vmax:          1.17 m/s AV Vmax:        125.00 cm/s  PV Peak grad:     5.5 mmHg AV Peak Grad:   6.2 mmHg     PR End Diast Vel: 5.29 msec LVOT Vmax:      90.40 cm/s   RVOT Peak grad:   1 mmHg  AORTA Ao Root diam: 2.80 cm MITRAL VALVE               TRICUSPID VALVE MV Area (PHT): 5.31 cm    TR Peak grad:   8.4 mmHg MV Decel Time: 143 msec    TR Vmax:        145.00 cm/s MV E velocity: 97.30 cm/s MV A velocity: 78.80 cm/s  SHUNTS MV E/A ratio:  1.23        Systemic Diam: 1.80 cm MV A Prime:    7.6 cm/s Skeet Latch MD Electronically signed by Skeet Latch MD Signature Date/Time: 12/13/2020/2:58:24 PM    Final     Microbiology: Recent Results (from the past 240 hour(s))  Resp Panel by RT-PCR (Flu A&B, Covid) Nasopharyngeal Swab     Status: None   Collection Time: 12/11/20  8:26 PM   Specimen: Nasopharyngeal Swab; Nasopharyngeal(NP) swabs in vial transport medium  Result Value Ref Range Status   SARS Coronavirus 2 by RT PCR NEGATIVE NEGATIVE Final    Comment: (NOTE) SARS-CoV-2 target nucleic acids are NOT DETECTED.  The SARS-CoV-2 RNA is generally detectable in upper respiratory specimens  during the acute phase of infection. The lowest concentration of SARS-CoV-2 viral copies this assay can detect is 138 copies/mL. A negative result does not preclude SARS-Cov-2 infection and should not be used as the sole basis for treatment or other patient management decisions. A negative result may occur with  improper specimen collection/handling, submission of specimen other than nasopharyngeal swab, presence of viral mutation(s) within the areas targeted by this assay, and inadequate number of viral copies(<138 copies/mL). A negative result must be combined with clinical observations, patient history, and epidemiological information. The expected result is Negative.  Fact Sheet for Patients:  EntrepreneurPulse.com.au  Fact Sheet for Healthcare Providers:  IncredibleEmployment.be  This test is no t yet approved or cleared by the [place] FDA and  has been authorized for detection and/or diagnosis of SARS-CoV-2 by FDA under an Emergency Use Authorization (EUA). This EUA will remain  in effect (meaning this test can be used) for the duration of the COVID-19 declaration under Section 564(b)(1) of the Act, 21 U.S.C.section 360bbb-3(b)(1), unless the authorization is terminated  or revoked sooner.       Influenza A by PCR NEGATIVE NEGATIVE Final   Influenza B by PCR NEGATIVE NEGATIVE Final    Comment: (NOTE) The Xpert Xpress SARS-CoV-2/FLU/RSV plus assay is intended as an aid in the diagnosis of influenza from Nasopharyngeal swab specimens and should not be used as a sole basis for treatment. Nasal washings and aspirates are  unacceptable for Xpert Xpress SARS-CoV-2/FLU/RSV testing.  Fact Sheet for Patients: EntrepreneurPulse.com.au  Fact Sheet for Healthcare Providers: IncredibleEmployment.be  This test is not yet approved or cleared by the [place] FDA and has been authorized for detection  and/or diagnosis of SARS-CoV-2 by FDA under an Emergency Use Authorization (EUA). This EUA will remain in effect (meaning this test can be used) for the duration of the COVID-19 declaration under Section 564(b)(1) of the Act, 21 U.S.C. section 360bbb-3(b)(1), unless the authorization is terminated or revoked.  Performed at [facility], [address] [place], [place] [address]   MRSA Next Gen by PCR, Nasal     Status: None   Collection Time: 12/12/20  8:14 PM   Specimen: Nasal Mucosa; Nasal Swab  Result Value Ref Range Status   MRSA by PCR Next Gen NOT DETECTED NOT DETECTED Final    Comment: (NOTE) The GeneXpert MRSA Assay (FDA approved for NASAL specimens only), is one component of a comprehensive MRSA colonization surveillance program. It is not intended to diagnose MRSA infection nor to guide or monitor treatment for MRSA infections. Test performance is not FDA approved in patients less than 40 years old. Performed at [facility], [place], [place] [address]   Respiratory (~20 pathogens) panel by PCR     Status: Abnormal   Collection Time: 12/12/20  8:16 PM   Specimen: Nasopharyngeal Swab; Respiratory  Result Value Ref Range Status   Adenovirus NOT DETECTED NOT DETECTED Final   Coronavirus 229E NOT DETECTED NOT DETECTED Final    Comment: (NOTE) The Coronavirus on the Respiratory Panel, DOES NOT test for the novel  Coronavirus (2019 nCoV)    Coronavirus HKU1 NOT DETECTED NOT DETECTED Final   Coronavirus NL63 NOT DETECTED NOT DETECTED Final   Coronavirus OC43 NOT DETECTED NOT DETECTED Final   Metapneumovirus NOT DETECTED NOT DETECTED Final   Rhinovirus / Enterovirus NOT DETECTED NOT DETECTED Final   Influenza A NOT DETECTED NOT DETECTED Final   Influenza B NOT DETECTED NOT DETECTED Final   Parainfluenza Virus 1 NOT DETECTED NOT DETECTED Final   Parainfluenza Virus 2 NOT DETECTED NOT DETECTED Final   Parainfluenza Virus 3 NOT DETECTED  NOT DETECTED Final   Parainfluenza Virus 4 NOT DETECTED NOT DETECTED Final   Respiratory Syncytial Virus DETECTED (A) NOT DETECTED Final   Bordetella pertussis NOT DETECTED NOT DETECTED Final   Bordetella Parapertussis NOT DETECTED NOT DETECTED Final   Chlamydophila pneumoniae NOT DETECTED NOT DETECTED Final   Mycoplasma pneumoniae NOT DETECTED NOT DETECTED Final    Comment: Performed at [facility], [place]. [address], [place], [place] [address]     Labs: Basic Metabolic Panel: Recent Labs  Lab 12/12/20 0831 12/12/20 1945 12/13/20 0523 12/14/20 0457 12/15/20 0516 12/16/20 0554  NA 123* 120* 123* 127* 129* 134*  K 2.7* 3.4* 3.2* 3.0* 3.9 4.5  CL 84* 85* 87* 90* 93* 100  CO2 30 25 27 28 26 30   GLUCOSE 115* 222* 97 104* 260* 89  BUN 20 24* 28* 26* 31* 31*  CREATININE 1.17 1.36* 1.49* 1.28* 1.42* 1.15  CALCIUM 8.7* 8.2* 8.5* 8.6* 9.0 9.1  MG 2.3  --  2.4 2.6* 2.9* 2.7*  PHOS 3.1  --  2.9 4.2 3.4 3.7   Liver Function Tests: Recent Labs  Lab 12/12/20 0831 12/13/20 0523 12/14/20 0457 12/15/20 0516 12/16/20 0554  AST 63* 54* 47* 43* 36  ALT 50* 41 37 44 42  ALKPHOS 50 43  44 48 46  BILITOT 1.4* 1.1 1.1 0.9 1.0  PROT 7.5 7.2 7.1 7.6 6.9  ALBUMIN 4.3 4.2 3.6 4.1 3.6   Recent Labs  Lab 12/11/20 2026  LIPASE 22   No results for input(s): AMMONIA in the last 168 hours. CBC: Recent Labs  Lab 12/12/20 0831 12/13/20 0523 12/14/20 0457 12/15/20 0516 12/16/20 0554  WBC 11.3* 10.5 8.5 16.8* 13.8*  NEUTROABS 8.6* 7.7 5.7 13.9* 9.8*  HGB 17.0 15.6 15.2 15.4 15.0  HCT 45.3 42.4 40.5 42.8 41.5  MCV 81.3 82.3 83.7 83.3 85.9  PLT 244 241 205 267 270   Cardiac Enzymes: No results for input(s): CKTOTAL, CKMB, CKMBINDEX, TROPONINI in the last 168 hours. BNP: BNP (last 3 results) Recent Labs    12/09/20 1213  BNP 82.7    ProBNP (last 3 results) Recent Labs    08/08/20 1258  PROBNP 141    CBG: Recent Labs  Lab 12/15/20 2127 12/16/20 0220 12/16/20 0728  12/16/20 1154 12/16/20 1617  GLUCAP 216* 140* 71 172* 96       Signed:  Carolyne Littles, MD [facility]

## 2020-12-16 NOTE — Progress Notes (Signed)
Mobility Specialist - Progress Note   12/16/20 1700  Mobility  Activity Refused mobility  Mobility performed by Mobility specialist    Pt politely declined mobility this date, states he just finished ambulating with nursing staff and is expecting to be d/c shortly.    Danny [name] Mobility Specialist 12/16/20, 5:22 PM

## 2020-12-16 NOTE — Progress Notes (Signed)
  Patient Saturations on Room Air at Rest = 98%  Patient Saturations on Room Air while Ambulating = 90%  Patient able to ambulate 360 feet on room air and maintain O2 sats at or above 90%.

## 2020-12-16 NOTE — Progress Notes (Signed)
Patient with occurrence of confusion tonight. Patient walking around room, sitting in recliner when writer entered to round. RN asked if patient had any needs or concerns at this time, patient states "my room looks different!" Reminded patient he is on the first floor at [facility]. Patient agreeable to return to bed; bed alarm on at this time with call bell in place.

## 2020-12-22 ENCOUNTER — Other Ambulatory Visit: Payer: Self-pay | Admitting: Cardiovascular Disease

## 2020-12-25 NOTE — Progress Notes (Signed)
Patient ID: Danny [name], male    DOB: 1955-07-07, 65 y.o.   MRN: [id number]  HPI  Danny [name] is a 65 y/o male with a history of CAD, DM, hyperlipidemia, HTN, carotid stenosis and chronic heart failure.   Echo report from 12/13/20 reviewed and showed an EF of 55-60% along with moderate LVH and trivial Danny.   LHC done 12/12/20 and showed: Severe two-vessel coronary artery disease with chronic total occlusions of the mid LAD and ostial LCx, as noted on prior catheterizations. Mild, nonobstructive RCA disease with patent PDA stent. Widely patent LIMA-LAD bypass graft.  SVG-D1 and SVG-OM1 are known to be occluded and were not engaged on today's study. Mildly elevated left ventricular filling pressure (LVEDP 15-20 mmHg) with grossly normal left ventricular contraction.  Admitted 12/11/20 due to a cough found to have NSTEMI. Cardiology consult obtained & cath done. Initially given IV lasix with transition to oral diuretics. Treated with solu-medrol and then prednisone. Positive for RSV. Discharged after 5 days.   He presents today for his initial visit with a chief complaint of minimal shortness of breath upon moderate exertion. He describes this as chronic in nature having been present for several months. He has associated pedal edema (L>R) and chronic difficulty sleeping along with this. He denies any dizziness, abdominal distention, palpitations, chest pain, cough, fatigue or weight gain.   Says that he's been trying to follow a more keto type diet and eats eggs often and says that he adds salt to his eggs. Does rinse off canned vegetables but then adds salt to them while cooking.   Past Medical History:  Diagnosis Date   Acute MI, lateral wall, initial episode of care ([place]) 03/23/2014   CAD (coronary artery disease)    a. s/p Lat MI 2/16 >> s/p CABG; b. LHC 1/17: mLAD 100, oD1 85%, oLCx 100, OM1 filled by collats from RPLB2, RPDA stent 95% ISR, L-LAD patent, S-OM1 occluded, S-D1  occluded >> PCI: Promus DES to RPDA  - residual CAD not amenable to PCI (dist vessel/diabetic appearance)    Carotid stenosis    a. Carotid US 2/19 bilat ICA 1-39%   Chronic combined systolic and diastolic CHF (congestive heart failure) ([place])    Hyperlipidemia 10/16/2014   Hypertension    Ischemic cardiomyopathy    a. Echo at [facility] 3/16:  EF 35-40%, diff HK, mild LVH, mild LAE, mild TR;  b. Echo 7/16: mild LVH, EF 50%, inf and lat HK-AK, Gr 2 DD, mod LAE, mild RVE, reduced RVSF, trivial TR, PASP 30 mmHg // c. Echo 5/17: moderate LVH, EF 45-50%, anteroseptal HK, grade 1 diastolic dysfunction, aortic sclerosis    Type 2 diabetes mellitus ([place]) 03/23/2014   Past Surgical History:  Procedure Laterality Date   CARDIAC CATHETERIZATION N/A 02/24/2015   Procedure: Left Heart Cath and Cors/Grafts Angiography;  Surgeon: Sherren Mocha, MD;  Location: [place] [facility];  Service: Cardiovascular;  Laterality: N/A;   CARDIAC CATHETERIZATION N/A 02/24/2015   Procedure: Coronary Stent Intervention;  Surgeon: Sherren Mocha, MD;  Location: [place] [facility];  Service: Cardiovascular;  Laterality: N/A;   CORONARY ARTERY BYPASS GRAFT N/A 03/28/2014   Procedure: CORONARY ARTERY BYPASS GRAFTING (CABG);  Surgeon: Melrose Nakayama, MD;  Location: [place];  Service: Open Heart Surgery;  Laterality: N/A;   LEFT HEART CATH AND CORS/GRAFTS ANGIOGRAPHY N/A 05/07/2019   Procedure: LEFT HEART CATH AND CORS/GRAFTS ANGIOGRAPHY;  Surgeon: Sherren Mocha, MD;  Location: [place] [facility];  Service:  Cardiovascular;  Laterality: N/A;   LEFT HEART CATH AND CORS/GRAFTS ANGIOGRAPHY N/A 12/12/2020   Procedure: LEFT HEART CATH AND CORS/GRAFTS ANGIOGRAPHY;  Surgeon: Yvonne Kendall, MD;  Location: [facility] INVASIVE CV LAB;  Service: Cardiovascular;  Laterality: N/A;   LEFT HEART CATHETERIZATION WITH CORONARY ANGIOGRAM N/A 03/23/2014   Procedure: LEFT HEART CATHETERIZATION WITH CORONARY ANGIOGRAM;  Surgeon: Micheline Chapman, MD;  Location:  [facility] CATH LAB;  Service: Cardiovascular;  Laterality: N/A;   PERCUTANEOUS CORONARY STENT INTERVENTION (PCI-S)  02/24/2015   PDA   TEE WITHOUT CARDIOVERSION N/A 03/28/2014   Procedure: TRANSESOPHAGEAL ECHOCARDIOGRAM (TEE);  Surgeon: Loreli Slot, MD;  Location: [facility] OR;  Service: Open Heart Surgery;  Laterality: N/A;   Family History  Problem Relation Age of Onset   Healthy Mother        No history of CAD   Healthy Father        No history of CAD   Heart attack Father    Diabetes Brother    Stroke Neg Hx    Social History   Tobacco Use   Smoking status: Never   Smokeless tobacco: Never  Substance Use Topics   Alcohol use: Yes   Allergies  Allergen Reactions   Spironolactone Other (See Comments)    Other reaction(s): Unknown Increased creatinine Increased creatinine   Prior to Admission medications   Medication Sig Start Date End Date Taking? Authorizing Provider  aspirin EC 81 MG tablet Take 1 tablet (81 mg total) by mouth daily. 04/30/19  Yes Tonny Bollman, MD  cloNIDine (CATAPRES) 0.2 MG tablet Take 0.2 mg by mouth 2 (two) times daily.   Yes [provider]  clopidogrel (PLAVIX) 75 MG tablet TAKE 1 TABLET BY MOUTH DAILY. PLEASE KEEP UPCOMING APPT IN JUNE 2022 BEFORE ANYMORE REFILLS. 08/25/20  Yes Tonny Bollman, MD  Glycerin-Hypromellose-PEG 400 ([employer] DRY EYE RELIEF) 0.2-0.2-1 % SOLN Place 1 drop into both eyes daily as needed (dry eyes).   Yes [provider]  guaiFENesin-codeine 100-10 MG/5ML syrup Take 10 mLs by mouth every 4 (four) hours as needed for cough. 12/16/20  Yes Drema Dallas, MD  HUMALOG KWIKPEN 200 UNIT/ML KwikPen Inject 206 Units into the skin as directed. (Administered via insulin pump)   Yes [provider]  ipratropium (ATROVENT) 0.03 % nasal spray Place 2 sprays into both nostrils daily as needed for rhinitis.   Yes [provider]  ipratropium-albuterol (DUONEB) 0.5-2.5 (3) MG/3ML SOLN Take 3 mLs by nebulization  every 2 (two) hours as needed. 12/16/20  Yes Drema Dallas, MD  isosorbide mononitrate (IMDUR) 30 MG 24 hr tablet Take 1 tablet (30 mg total) by mouth daily. 08/08/20  Yes Alver Sorrow, NP  metoprolol succinate (TOPROL-XL) 100 MG 24 hr tablet TAKE 1 TABLET BY MOUTH DAILY. TAKE WITH OR IMMEDIATELY FOLLOWING A MEAL. 12/22/20  Yes Tonny Bollman, MD  nitroGLYCERIN (NITROSTAT) 0.4 MG SL tablet Place 1 tablet (0.4 mg total) under the tongue every 5 (five) minutes as needed for chest pain. 04/27/19  Yes Tonny Bollman, MD  pantoprazole (PROTONIX) 40 MG tablet Take 40 mg by mouth daily. 10/18/20  Yes [provider]  rosuvastatin (CRESTOR) 40 MG tablet TAKE 1 TABLET BY MOUTH EVERY DAY 05/19/20  Yes Tereso Newcomer T, PA-C  predniSONE (DELTASONE) 50 MG tablet 1 tablet p.o. at breakfast daily Patient not taking: Reported on 12/26/2020 12/17/20   Drema Dallas, MD   Review of Systems  Constitutional:  Negative for appetite change and fatigue.  HENT:  Negative for congestion, postnasal drip and sore throat.   Eyes: Negative.   Respiratory:  Positive for shortness of breath (with moderate exertion). Negative for cough, chest tightness and wheezing.   Cardiovascular:  Positive for leg swelling (left side > R side). Negative for chest pain and palpitations.  Gastrointestinal:  Negative for abdominal distention and abdominal pain.  Endocrine: Negative.   Genitourinary: Negative.   Musculoskeletal:  Negative for back pain and neck pain.  Skin: Negative.   Allergic/Immunologic: Negative.   Neurological:  Negative for dizziness and light-headedness.  Hematological:  Negative for adenopathy. Does not bruise/bleed easily.  Psychiatric/Behavioral:  Positive for sleep disturbance (wake frequently; sleeping on 1 pillow). Negative for dysphoric mood. The patient is not nervous/anxious.    Vitals:   12/26/20 1105  BP: (!) 148/88  Pulse: 76  Resp: 18  SpO2: 97%  Weight: 277 lb 8 oz (125.9 kg)  Height:  5\' 8"  (1.727 m)   Wt Readings from Last 3 Encounters:  12/26/20 277 lb 8 oz (125.9 kg)  12/15/20 276 lb 3.8 oz (125.3 kg)  11/19/20 279 lb (126.6 kg)   Lab Results  Component Value Date   CREATININE 1.15 12/16/2020   CREATININE 1.42 (H) 12/15/2020   CREATININE 1.28 (H) 12/14/2020    Physical Exam Vitals and nursing note reviewed.  Constitutional:      Appearance: Normal appearance.  HENT:     Head: Normocephalic and atraumatic.  Cardiovascular:     Rate and Rhythm: Normal rate and regular rhythm.  Pulmonary:     Effort: Pulmonary effort is normal. No respiratory distress.     Breath sounds: No wheezing or rales.  Abdominal:     General: There is no distension.     Palpations: Abdomen is soft.  Musculoskeletal:        General: No tenderness.     Cervical back: Normal range of motion and neck supple.     Right lower leg: Edema (1+ pitting) present.     Left lower leg: Edema (2+ pitting) present.  Skin:    General: Skin is warm and dry.  Neurological:     General: No focal deficit present.     Mental Status: He is alert and oriented to person, place, and time.  Psychiatric:        Mood and Affect: Mood normal.        Behavior: Behavior normal.        Thought Content: Thought content normal.    Assessment & Plan:  1: Chronic heart failure with preserved ejection fraction with structural changes (LVH)- - NYHA class II - euvolemic today - not weighing consistently but does have scales; instructed to weigh every day and call for an overnight weight gain of > 2 pounds or a weekly weight gain of > 5 pounds - does add salt to eggs or when cooking (even after he's previously rinsed canned vegs off); encouraged him to not add any salt to anything and a low sodium cookbook was provided to him - will add entresto 24/26mg  BID; 30 day voucher provided - will check BMP next visit - Type 1 diabetic so not a candidate for jardiance - BNP 12/12/20 was 2230   2: HTN- - BP mildly  elevated (148/88) - saw PCP Danny [name]) 12/09/20 - BMP 12/16/20 reviewed and showed sodium 134, potassium 4.5, creatinine 1.15 & GFR >60  3: Type 1 DM- - saw endocrinology Danny [name]) 10/17/20 - A1c 12/13/20 was 7.2% - glucose at  home today was 99  4: CAD- - saw cardiology (Danny [name]) 11/19/20  5: Lymphedema- - stage 2 - encouraged to wear compression socks daily with removal at bedtime - encouraged to elevate his legs when sitting for long periods of time - instructed to be as active as possible with allowing for frequent rest periods - consider compression boots if edema persists   Medication bottles reviewed.   Return in 1 month or sooner for any questions/problems before then.

## 2020-12-26 ENCOUNTER — Encounter: Payer: Self-pay | Admitting: Family

## 2020-12-26 ENCOUNTER — Other Ambulatory Visit: Payer: Self-pay

## 2020-12-26 ENCOUNTER — Ambulatory Visit: Payer: Medicare Other | Attending: Family | Admitting: Family

## 2020-12-26 VITALS — BP 148/88 | HR 76 | Resp 18 | Ht 68.0 in | Wt 277.5 lb

## 2020-12-26 DIAGNOSIS — I25118 Atherosclerotic heart disease of native coronary artery with other forms of angina pectoris: Secondary | ICD-10-CM

## 2020-12-26 DIAGNOSIS — Z79899 Other long term (current) drug therapy: Secondary | ICD-10-CM | POA: Diagnosis not present

## 2020-12-26 DIAGNOSIS — I5032 Chronic diastolic (congestive) heart failure: Secondary | ICD-10-CM

## 2020-12-26 DIAGNOSIS — I6529 Occlusion and stenosis of unspecified carotid artery: Secondary | ICD-10-CM | POA: Insufficient documentation

## 2020-12-26 DIAGNOSIS — I890 Lymphedema, not elsewhere classified: Secondary | ICD-10-CM | POA: Diagnosis not present

## 2020-12-26 DIAGNOSIS — I214 Non-ST elevation (NSTEMI) myocardial infarction: Secondary | ICD-10-CM | POA: Insufficient documentation

## 2020-12-26 DIAGNOSIS — Z09 Encounter for follow-up examination after completed treatment for conditions other than malignant neoplasm: Secondary | ICD-10-CM | POA: Diagnosis not present

## 2020-12-26 DIAGNOSIS — Z713 Dietary counseling and surveillance: Secondary | ICD-10-CM | POA: Diagnosis not present

## 2020-12-26 DIAGNOSIS — E785 Hyperlipidemia, unspecified: Secondary | ICD-10-CM | POA: Insufficient documentation

## 2020-12-26 DIAGNOSIS — I10 Essential (primary) hypertension: Secondary | ICD-10-CM

## 2020-12-26 DIAGNOSIS — E109 Type 1 diabetes mellitus without complications: Secondary | ICD-10-CM | POA: Diagnosis not present

## 2020-12-26 DIAGNOSIS — I110 Hypertensive heart disease with heart failure: Secondary | ICD-10-CM | POA: Diagnosis not present

## 2020-12-26 DIAGNOSIS — I2510 Atherosclerotic heart disease of native coronary artery without angina pectoris: Secondary | ICD-10-CM | POA: Insufficient documentation

## 2020-12-26 MED ORDER — SACUBITRIL-VALSARTAN 24-26 MG PO TABS: 1.0000 | ORAL_TABLET | Freq: Two times a day (BID) | ORAL | 3 refills | Status: DC

## 2020-12-26 NOTE — Patient Instructions (Addendum)
Begin weighing daily and call for an overnight weight gain of > 2 pounds or a weekly weight gain of >5 pounds.    Begin wearing compression socks daily and remove them at bedtime   Begin taking entresto as 1 tablet in the morning and 1 tablet in the evening.

## 2021-01-26 ENCOUNTER — Ambulatory Visit: Payer: Medicare Other | Admitting: Family

## 2021-01-27 NOTE — Progress Notes (Signed)
Patient ID: Danny [name], male    DOB: Jan 07, 1956, 65 y.o.   MRN: [id number]  HPI  Danny [name] is a 65 y/o male with a history of CAD, DM, hyperlipidemia, HTN, carotid stenosis and chronic heart failure.   Echo report from 12/13/20 reviewed and showed an EF of 55-60% along with moderate LVH and trivial Danny.   LHC done 12/12/20 and showed: Severe two-vessel coronary artery disease with chronic total occlusions of the mid LAD and ostial LCx, as noted on prior catheterizations. Mild, nonobstructive RCA disease with patent PDA stent. Widely patent LIMA-LAD bypass graft.  SVG-D1 and SVG-OM1 are known to be occluded and were not engaged on today's study. Mildly elevated left ventricular filling pressure (LVEDP 15-20 mmHg) with grossly normal left ventricular contraction.  Admitted 12/11/20 due to a cough found to have NSTEMI. Cardiology consult obtained & cath done. Initially given IV lasix with transition to oral diuretics. Treated with solu-medrol and then prednisone. Positive for RSV. Discharged after 5 days.   He presents today for a follow-up visit with a chief complaint of minimal shortness of breath with moderate exertion. He describes this as chronic in nature having been present for several years. He has associated pedal edema, abdominal distention and chronic difficulty sleeping along with this. He denies any dizziness, palpitations, chest pain, cough or fatigue.   Currently not on a diuretic due to renal disease.   He says that a few days after starting entresto he developed muscle aches in his thighs and calves "like if I ran a few miles" along with bilateral hand swelling and aching. He says that his hands feel swollen where he can't make a fist completely. He has continued to take the entresto  Past Medical History:  Diagnosis Date   Acute MI, lateral wall, initial episode of care ([place]) 03/23/2014   CAD (coronary artery disease)    a. s/p Lat MI 2/16 >> s/p CABG; b. LHC  1/17: mLAD 100, oD1 85%, oLCx 100, OM1 filled by collats from RPLB2, RPDA stent 95% ISR, L-LAD patent, S-OM1 occluded, S-D1 occluded >> PCI: Promus DES to RPDA  - residual CAD not amenable to PCI (dist vessel/diabetic appearance)    Carotid stenosis    a. Carotid US 2/19 bilat ICA 1-39%   Chronic combined systolic and diastolic CHF (congestive heart failure) ([place])    Hyperlipidemia 10/16/2014   Hypertension    Ischemic cardiomyopathy    a. Echo at [facility] 3/16:  EF 35-40%, diff HK, mild LVH, mild LAE, mild TR;  b. Echo 7/16: mild LVH, EF 50%, inf and lat HK-AK, Gr 2 DD, mod LAE, mild RVE, reduced RVSF, trivial TR, PASP 30 mmHg // c. Echo 5/17: moderate LVH, EF 45-50%, anteroseptal HK, grade 1 diastolic dysfunction, aortic sclerosis    Type 2 diabetes mellitus ([place]) 03/23/2014   Past Surgical History:  Procedure Laterality Date   CARDIAC CATHETERIZATION N/A 02/24/2015   Procedure: Left Heart Cath and Cors/Grafts Angiography;  Surgeon: Sherren Mocha, MD;  Location: [place] [facility];  Service: Cardiovascular;  Laterality: N/A;   CARDIAC CATHETERIZATION N/A 02/24/2015   Procedure: Coronary Stent Intervention;  Surgeon: Sherren Mocha, MD;  Location: [place] [facility];  Service: Cardiovascular;  Laterality: N/A;   CORONARY ARTERY BYPASS GRAFT N/A 03/28/2014   Procedure: CORONARY ARTERY BYPASS GRAFTING (CABG);  Surgeon: Melrose Nakayama, MD;  Location: [place];  Service: Open Heart Surgery;  Laterality: N/A;   LEFT HEART CATH AND CORS/GRAFTS ANGIOGRAPHY N/A 05/07/2019  Procedure: LEFT HEART CATH AND CORS/GRAFTS ANGIOGRAPHY;  Surgeon: Tonny Bollman, MD;  Location: [facility] INVASIVE CV LAB;  Service: Cardiovascular;  Laterality: N/A;   LEFT HEART CATH AND CORS/GRAFTS ANGIOGRAPHY N/A 12/12/2020   Procedure: LEFT HEART CATH AND CORS/GRAFTS ANGIOGRAPHY;  Surgeon: Yvonne Kendall, MD;  Location: [facility] INVASIVE CV LAB;  Service: Cardiovascular;  Laterality: N/A;   LEFT HEART CATHETERIZATION WITH CORONARY  ANGIOGRAM N/A 03/23/2014   Procedure: LEFT HEART CATHETERIZATION WITH CORONARY ANGIOGRAM;  Surgeon: Micheline Chapman, MD;  Location: [facility] CATH LAB;  Service: Cardiovascular;  Laterality: N/A;   PERCUTANEOUS CORONARY STENT INTERVENTION (PCI-S)  02/24/2015   PDA   TEE WITHOUT CARDIOVERSION N/A 03/28/2014   Procedure: TRANSESOPHAGEAL ECHOCARDIOGRAM (TEE);  Surgeon: Loreli Slot, MD;  Location: [facility] OR;  Service: Open Heart Surgery;  Laterality: N/A;   Family History  Problem Relation Age of Onset   Healthy Mother        No history of CAD   Healthy Father        No history of CAD   Heart attack Father    Diabetes Brother    Stroke Neg Hx    Social History   Tobacco Use   Smoking status: Never   Smokeless tobacco: Never  Substance Use Topics   Alcohol use: Yes   Allergies  Allergen Reactions   Spironolactone Other (See Comments)    Other reaction(s): Unknown Increased creatinine Increased creatinine   Prior to Admission medications   Medication Sig Start Date End Date Taking? Authorizing Provider  aspirin EC 81 MG tablet Take 1 tablet (81 mg total) by mouth daily. 04/30/19  Yes Tonny Bollman, MD  cloNIDine (CATAPRES) 0.2 MG tablet Take 0.2 mg by mouth 2 (two) times daily.   Yes [provider]  clopidogrel (PLAVIX) 75 MG tablet TAKE 1 TABLET BY MOUTH DAILY. PLEASE KEEP UPCOMING APPT IN JUNE 2022 BEFORE ANYMORE REFILLS. 08/25/20  Yes Tonny Bollman, MD  Glycerin-Hypromellose-PEG 400 ([facility] DRY EYE RELIEF) 0.2-0.2-1 % SOLN Place 1 drop into both eyes daily as needed (dry eyes).   Yes [provider]  guaiFENesin-codeine 100-10 MG/5ML syrup Take 10 mLs by mouth every 4 (four) hours as needed for cough. 12/16/20  Yes Drema Dallas, MD  HUMALOG KWIKPEN 200 UNIT/ML KwikPen Inject 206 Units into the skin as directed. (Administered via insulin pump)   Yes [provider]  ipratropium (ATROVENT) 0.03 % nasal spray Place 2 sprays into both nostrils daily as needed  for rhinitis.   Yes [provider]  ipratropium-albuterol (DUONEB) 0.5-2.5 (3) MG/3ML SOLN Take 3 mLs by nebulization every 2 (two) hours as needed. 12/16/20  Yes Drema Dallas, MD  isosorbide mononitrate (IMDUR) 30 MG 24 hr tablet Take 1 tablet (30 mg total) by mouth daily. 08/08/20  Yes Alver Sorrow, NP  metoprolol succinate (TOPROL-XL) 100 MG 24 hr tablet TAKE 1 TABLET BY MOUTH DAILY. TAKE WITH OR IMMEDIATELY FOLLOWING A MEAL. 12/22/20  Yes Tonny Bollman, MD  nitroGLYCERIN (NITROSTAT) 0.4 MG SL tablet Place 1 tablet (0.4 mg total) under the tongue every 5 (five) minutes as needed for chest pain. 04/27/19  Yes Tonny Bollman, MD  pantoprazole (PROTONIX) 40 MG tablet Take 40 mg by mouth daily. 10/18/20  Yes [provider]  rosuvastatin (CRESTOR) 40 MG tablet TAKE 1 TABLET BY MOUTH EVERY DAY 05/19/20  Yes Weaver, Scott T, PA-C  sacubitril-valsartan (ENTRESTO) 24-26 MG Take 1 tablet by mouth 2 (two) times daily. 12/26/20  Yes Clarisa Kindred  A, FNP    Review of Systems  Constitutional:  Negative for appetite change and fatigue.  HENT:  Negative for congestion, postnasal drip and sore throat.   Eyes: Negative.   Respiratory:  Positive for shortness of breath (with moderate exertion). Negative for cough, chest tightness and wheezing.   Cardiovascular:  Positive for leg swelling (left side > R side). Negative for chest pain and palpitations.  Gastrointestinal:  Positive for abdominal distention. Negative for abdominal pain.  Endocrine: Negative.   Genitourinary: Negative.   Musculoskeletal:  Positive for arthralgias (hands) and myalgias (thighs and calves). Negative for back pain and neck pain.  Skin: Negative.   Allergic/Immunologic: Negative.   Neurological:  Negative for dizziness and light-headedness.  Hematological:  Negative for adenopathy. Does not bruise/bleed easily.  Psychiatric/Behavioral:  Positive for sleep disturbance (wake frequently; sleeping on 1 pillow).  Negative for dysphoric mood. The patient is not nervous/anxious.    Vitals:   01/28/21 1100  BP: (!) 153/75  Pulse: 62  Resp: 20  SpO2: 98%  Weight: 282 lb 6 oz (128.1 kg)  Height: 5\' 8"  (1.727 m)   Wt Readings from Last 3 Encounters:  01/28/21 282 lb 6 oz (128.1 kg)  12/26/20 277 lb 8 oz (125.9 kg)  12/15/20 276 lb 3.8 oz (125.3 kg)   Lab Results  Component Value Date   CREATININE 1.15 12/16/2020   CREATININE 1.42 (H) 12/15/2020   CREATININE 1.28 (H) 12/14/2020    Physical Exam Vitals and nursing note reviewed.  Constitutional:      Appearance: Normal appearance.  HENT:     Head: Normocephalic and atraumatic.  Cardiovascular:     Rate and Rhythm: Normal rate and regular rhythm.  Pulmonary:     Effort: Pulmonary effort is normal. No respiratory distress.     Breath sounds: No wheezing or rales.  Abdominal:     General: There is distension.     Palpations: Abdomen is soft.  Musculoskeletal:        General: No tenderness.     Cervical back: Normal range of motion and neck supple.     Right lower leg: Edema (1+ pitting) present.     Left lower leg: Edema (2+ pitting) present.  Skin:    General: Skin is warm and dry.  Neurological:     General: No focal deficit present.     Mental Status: He is alert and oriented to person, place, and time.  Psychiatric:        Mood and Affect: Mood normal.        Behavior: Behavior normal.        Thought Content: Thought content normal.   Assessment & Plan:  1: Chronic heart failure with preserved ejection fraction with structural changes (LVH)- - NYHA class II - euvolemic today - weighing daily; reminded to call for an overnight weight gain of > 2 pounds or a weekly weight gain of > 5 pounds - weight up 5 pounds from last visit here 1 month ago - does add salt to eggs but says "that's about it" - entresto started at last visit and said that a few days after he started it, he developed thigh/ calf muscle pain, hand swelling  and aching in his hands - will stop entresto and see if symptoms resolve - Type 1 diabetic so not a candidate for jardiance - will check labs today and begin torsemide 20mg  daily; may need to also start potassium supplement but will get labs back first -  will recheck labs at next visit since diuretic being started today - BNP 12/12/20 was 2230   2: HTN- - BP mildly elevated (153/75); starting diuretic per above - saw PCP Etheleen Mayhew) 01/01/21 - BMP 12/16/20 reviewed and showed sodium 134, potassium 4.5, creatinine 1.15 & GFR >60  3: Type 1 DM- - saw endocrinology Gershon Crane) 10/17/20 - A1c 12/13/20 was 7.2% - glucose at home today was 147  4: CAD- - saw cardiology Iver Nestle) 11/19/20  5: Lymphedema- - stage 2 - has been wearing compression socks but has been wearing them at bedtime and taking them off in the morning; instructed to wear them during the day with removal at bedtime - encouraged to elevate his legs when sitting for long periods of time - instructed to be as active as possible with allowing for frequent rest periods - consider compression boots if edema persists   Medication bottles reviewed.   Return in 2 weeks or sooner for any questions/problems before then.

## 2021-01-28 ENCOUNTER — Other Ambulatory Visit: Payer: Self-pay

## 2021-01-28 ENCOUNTER — Ambulatory Visit: Payer: Medicare Other | Attending: Family | Admitting: Family

## 2021-01-28 ENCOUNTER — Telehealth: Payer: Self-pay

## 2021-01-28 ENCOUNTER — Encounter: Payer: Self-pay | Admitting: Family

## 2021-01-28 VITALS — BP 153/75 | HR 62 | Resp 20 | Ht 68.0 in | Wt 282.4 lb

## 2021-01-28 DIAGNOSIS — I110 Hypertensive heart disease with heart failure: Secondary | ICD-10-CM | POA: Diagnosis present

## 2021-01-28 DIAGNOSIS — I10 Essential (primary) hypertension: Secondary | ICD-10-CM | POA: Diagnosis not present

## 2021-01-28 DIAGNOSIS — M7989 Other specified soft tissue disorders: Secondary | ICD-10-CM | POA: Diagnosis not present

## 2021-01-28 DIAGNOSIS — I5032 Chronic diastolic (congestive) heart failure: Secondary | ICD-10-CM | POA: Diagnosis not present

## 2021-01-28 DIAGNOSIS — Z79899 Other long term (current) drug therapy: Secondary | ICD-10-CM | POA: Insufficient documentation

## 2021-01-28 DIAGNOSIS — I890 Lymphedema, not elsewhere classified: Secondary | ICD-10-CM | POA: Diagnosis not present

## 2021-01-28 DIAGNOSIS — E785 Hyperlipidemia, unspecified: Secondary | ICD-10-CM | POA: Diagnosis not present

## 2021-01-28 DIAGNOSIS — I6529 Occlusion and stenosis of unspecified carotid artery: Secondary | ICD-10-CM | POA: Diagnosis not present

## 2021-01-28 DIAGNOSIS — I2510 Atherosclerotic heart disease of native coronary artery without angina pectoris: Secondary | ICD-10-CM | POA: Insufficient documentation

## 2021-01-28 DIAGNOSIS — M791 Myalgia, unspecified site: Secondary | ICD-10-CM | POA: Diagnosis not present

## 2021-01-28 DIAGNOSIS — Z955 Presence of coronary angioplasty implant and graft: Secondary | ICD-10-CM | POA: Diagnosis not present

## 2021-01-28 DIAGNOSIS — I252 Old myocardial infarction: Secondary | ICD-10-CM | POA: Diagnosis not present

## 2021-01-28 DIAGNOSIS — I25118 Atherosclerotic heart disease of native coronary artery with other forms of angina pectoris: Secondary | ICD-10-CM | POA: Diagnosis not present

## 2021-01-28 DIAGNOSIS — Z951 Presence of aortocoronary bypass graft: Secondary | ICD-10-CM | POA: Insufficient documentation

## 2021-01-28 DIAGNOSIS — E109 Type 1 diabetes mellitus without complications: Secondary | ICD-10-CM | POA: Diagnosis not present

## 2021-01-28 LAB — BASIC METABOLIC PANEL
Anion gap: 7 (ref 5–15)
BUN: 11 mg/dL (ref 8–23)
CO2: 24 mmol/L (ref 22–32)
Calcium: 8.6 mg/dL — ABNORMAL LOW (ref 8.9–10.3)
Chloride: 107 mmol/L (ref 98–111)
Creatinine, Ser: 0.96 mg/dL (ref 0.61–1.24)
GFR, Estimated: >60 mL/min (ref >60)
Glucose, Bld: 221 mg/dL — ABNORMAL HIGH (ref 70–99)
Potassium: 3.8 mmol/L (ref 3.5–5.1)
Sodium: 138 mmol/L (ref 135–145)

## 2021-01-28 MED ORDER — TORSEMIDE 20 MG PO TABS: 20.0000 mg | ORAL_TABLET | Freq: Every day | ORAL | 5 refills | Status: DC

## 2021-01-28 MED ORDER — POTASSIUM CHLORIDE CRYS ER 20 MEQ PO TBCR: 20.0000 meq | EXTENDED_RELEASE_TABLET | Freq: Every day | ORAL | 3 refills | Status: DC

## 2021-01-28 NOTE — Patient Instructions (Addendum)
Continue weighing daily and call for an overnight weight gain of 3 pounds or more or a weekly weight gain of more than 5 pounds.    Stop taking entresto at this time and see if muscle aches improve.   Begin torsemide 20mg  as 1 tablet every morning (fluid pill)   Put compression socks on every morning and remove before bedtime

## 2021-01-28 NOTE — Telephone Encounter (Addendum)
Below lab results and information about beginning potassium 20 meq daily given to patient over phone. Patient acknowledged and verbalized understanding.  Suanne Marker, RN ----- Message from Delma Freeze, FNP sent at 01/28/2021 12:43 PM EST ----- Kidney function looks great. Potassium is normal but since we will be starting torsemide, will also start potassium once a day

## 2021-02-17 NOTE — Progress Notes (Signed)
Patient ID: Danny [name], male    DOB: [birth year], 66 y.o.   MRN: [id number]  HPI  Danny [name] is a 66 y/o male with a history of CAD, DM, hyperlipidemia, HTN, carotid stenosis and chronic heart failure.   Echo report from 12/13/20 reviewed and showed an EF of 55-60% along with moderate LVH and trivial Danny.   LHC done 12/12/20 and showed: Severe two-vessel coronary artery disease with chronic total occlusions of the mid LAD and ostial LCx, as noted on prior catheterizations. Mild, nonobstructive RCA disease with patent PDA stent. Widely patent LIMA-LAD bypass graft.  SVG-D1 and SVG-OM1 are known to be occluded and were not engaged on today's study. Mildly elevated left ventricular filling pressure (LVEDP 15-20 mmHg) with grossly normal left ventricular contraction.  Admitted 12/11/20 due to a cough found to have NSTEMI. Cardiology consult obtained & cath done. Initially given IV lasix with transition to oral diuretics. Treated with solu-medrol and then prednisone. Positive for RSV. Discharged after 5 days.   He presents today for a follow-up visit with a chief complaint of minimal shortness of breath upon moderate exertion. He describes this as chronic in nature having been present for several years. He has associated pedal edema (has been wearing compression socks while sleeping), abdominal distention and chronic difficulty sleeping along with this. He denies any dizziness, palpitations, chest pain, cough, fatigue or weight gain.   Feels better since starting the diuretic. Stopped his entresto last time he was here and no longer has muscle aches or hand edema although he doesn't feel like these symptoms were related to the entresto after all. Is willing to try the entresto again.   Past Medical History:  Diagnosis Date   Acute MI, lateral wall, initial episode of care ([place]) 03/23/2014   CAD (coronary artery disease)    a. s/p Lat MI 2/16 >> s/p CABG; b. LHC 1/17: mLAD 100, oD1 85%,  oLCx 100, OM1 filled by collats from RPLB2, RPDA stent 95% ISR, L-LAD patent, S-OM1 occluded, S-D1 occluded >> PCI: Promus DES to RPDA  - residual CAD not amenable to PCI (dist vessel/diabetic appearance)    Carotid stenosis    a. Carotid US 2/19 bilat ICA 1-39%   Chronic combined systolic and diastolic CHF (congestive heart failure) ([place])    Hyperlipidemia 10/16/2014   Hypertension    Ischemic cardiomyopathy    a. Echo at [facility] 3/16:  EF 35-40%, diff HK, mild LVH, mild LAE, mild TR;  b. Echo 7/16: mild LVH, EF 50%, inf and lat HK-AK, Gr 2 DD, mod LAE, mild RVE, reduced RVSF, trivial TR, PASP 30 mmHg // c. Echo 5/17: moderate LVH, EF 45-50%, anteroseptal HK, grade 1 diastolic dysfunction, aortic sclerosis    Type 2 diabetes mellitus ([place]) 03/23/2014   Past Surgical History:  Procedure Laterality Date   CARDIAC CATHETERIZATION N/A 02/24/2015   Procedure: Left Heart Cath and Cors/Grafts Angiography;  Surgeon: Sherren Mocha, MD;  Location: [place] [facility];  Service: Cardiovascular;  Laterality: N/A;   CARDIAC CATHETERIZATION N/A 02/24/2015   Procedure: Coronary Stent Intervention;  Surgeon: Sherren Mocha, MD;  Location: [place] [facility];  Service: Cardiovascular;  Laterality: N/A;   CORONARY ARTERY BYPASS GRAFT N/A 03/28/2014   Procedure: CORONARY ARTERY BYPASS GRAFTING (CABG);  Surgeon: Melrose Nakayama, MD;  Location: [place];  Service: Open Heart Surgery;  Laterality: N/A;   LEFT HEART CATH AND CORS/GRAFTS ANGIOGRAPHY N/A 05/07/2019   Procedure: LEFT HEART CATH AND CORS/GRAFTS ANGIOGRAPHY;  Surgeon: Sherren Mocha, MD;  Location: [place] [facility];  Service: Cardiovascular;  Laterality: N/A;   LEFT HEART CATH AND CORS/GRAFTS ANGIOGRAPHY N/A 12/12/2020   Procedure: LEFT HEART CATH AND CORS/GRAFTS ANGIOGRAPHY;  Surgeon: Nelva Bush, MD;  Location: [place] [facility];  Service: Cardiovascular;  Laterality: N/A;   LEFT HEART CATHETERIZATION WITH CORONARY ANGIOGRAM N/A 03/23/2014    Procedure: LEFT HEART CATHETERIZATION WITH CORONARY ANGIOGRAM;  Surgeon: Blane Ohara, MD;  Location: [facility] CATH LAB;  Service: Cardiovascular;  Laterality: N/A;   PERCUTANEOUS CORONARY STENT INTERVENTION (PCI-S)  02/24/2015   PDA   TEE WITHOUT CARDIOVERSION N/A 03/28/2014   Procedure: TRANSESOPHAGEAL ECHOCARDIOGRAM (TEE);  Surgeon: Melrose Nakayama, MD;  Location: [place];  Service: Open Heart Surgery;  Laterality: N/A;   Family History  Problem Relation Age of Onset   Healthy Mother        No history of CAD   Healthy Father        No history of CAD   Heart attack Father    Diabetes Brother    Stroke Neg Hx    Social History   Tobacco Use   Smoking status: Never   Smokeless tobacco: Never  Substance Use Topics   Alcohol use: Yes   Allergies  Allergen Reactions   Spironolactone Other (See Comments)    Other reaction(s): Unknown Increased creatinine Increased creatinine   Prior to Admission medications   Medication Sig Start Date End Date Taking? Authorizing Provider  aspirin EC 81 MG tablet Take 1 tablet (81 mg total) by mouth daily. 04/30/19  Yes Sherren Mocha, MD  cloNIDine (CATAPRES) 0.2 MG tablet Take 0.2 mg by mouth 2 (two) times daily.   Yes [provider]  clopidogrel (PLAVIX) 75 MG tablet TAKE 1 TABLET BY MOUTH DAILY. PLEASE KEEP UPCOMING APPT IN JUNE 2022 BEFORE ANYMORE REFILLS. 08/25/20  Yes Sherren Mocha, MD  Glycerin-Hypromellose-PEG 400 ([facility] DRY EYE RELIEF) 0.2-0.2-1 % SOLN Place 1 drop into both eyes daily as needed (dry eyes).   Yes [provider]  guaiFENesin-codeine 100-10 MG/5ML syrup Take 10 mLs by mouth every 4 (four) hours as needed for cough. 12/16/20  Yes Allie Bossier, MD  HUMALOG KWIKPEN 200 UNIT/ML KwikPen Inject 206 Units into the skin as directed. (Administered via insulin pump)   Yes [provider]  ibuprofen (ADVIL) 200 MG tablet Take 200 mg by mouth every 6 (six) hours as needed for headache or mild pain.   Yes  [provider]  ipratropium (ATROVENT) 0.03 % nasal spray Place 2 sprays into both nostrils daily as needed for rhinitis.   Yes [provider]  ipratropium-albuterol (DUONEB) 0.5-2.5 (3) MG/3ML SOLN Take 3 mLs by nebulization every 2 (two) hours as needed. 12/16/20  Yes Allie Bossier, MD  isosorbide mononitrate (IMDUR) 30 MG 24 hr tablet Take 1 tablet (30 mg total) by mouth daily. 08/08/20  Yes Loel Dubonnet, NP  nitroGLYCERIN (NITROSTAT) 0.4 MG SL tablet Place 1 tablet (0.4 mg total) under the tongue every 5 (five) minutes as needed for chest pain. 04/27/19  Yes Sherren Mocha, MD  pantoprazole (PROTONIX) 40 MG tablet Take 40 mg by mouth daily. 10/18/20  Yes [provider]  potassium chloride SA (KLOR-CON M) 20 MEQ tablet Take 1 tablet (20 mEq total) by mouth daily. 01/28/21  Yes Darylene Price A, FNP  rosuvastatin (CRESTOR) 40 MG tablet TAKE 1 TABLET BY MOUTH EVERY DAY 05/19/20  Yes Richardson Dopp T, PA-C  torsemide (  DEMADEX) 20 MG tablet Take 1 tablet (20 mg total) by mouth daily. 01/28/21 02/27/21 Yes Azaleah Usman, Otila Kluver A, FNP  metoprolol succinate (TOPROL-XL) 100 MG 24 hr tablet TAKE 1 TABLET BY MOUTH DAILY. TAKE WITH OR IMMEDIATELY FOLLOWING A MEAL. Patient not taking: Reported on 02/18/2021 12/22/20   Sherren Mocha, MD  sacubitril-valsartan (ENTRESTO) 24-26 MG Take 1 tablet by mouth 2 (two) times daily. Patient not taking: Reported on 02/18/2021 12/26/20   Alisa Graff, FNP   Review of Systems  Constitutional:  Negative for appetite change and fatigue.  HENT:  Negative for congestion, postnasal drip and sore throat.   Eyes: Negative.   Respiratory:  Positive for shortness of breath (with moderate exertion). Negative for cough, chest tightness and wheezing.   Cardiovascular:  Positive for leg swelling (improving). Negative for chest pain and palpitations.  Gastrointestinal:  Positive for abdominal distention. Negative for abdominal pain.  Endocrine: Negative.    Genitourinary: Negative.   Musculoskeletal:  Negative for arthralgias, back pain, myalgias and neck pain.  Skin: Negative.   Allergic/Immunologic: Negative.   Neurological:  Negative for dizziness, weakness and light-headedness.  Hematological:  Negative for adenopathy. Does not bruise/bleed easily.  Psychiatric/Behavioral:  Positive for sleep disturbance (wake frequently; sleeping on 1 pillow). Negative for dysphoric mood. The patient is not nervous/anxious.    Vitals:   02/18/21 1009  BP: 130/80  Pulse: 94  Resp: 18  SpO2: 97%  Weight: 278 lb 8 oz (126.3 kg)  Height: 5\' 8"  (1.727 m)   Wt Readings from Last 3 Encounters:  02/18/21 278 lb 8 oz (126.3 kg)  01/28/21 282 lb 6 oz (128.1 kg)  12/26/20 277 lb 8 oz (125.9 kg)   Lab Results  Component Value Date   CREATININE 0.96 01/28/2021   CREATININE 1.15 12/16/2020   CREATININE 1.42 (H) 12/15/2020   Physical Exam Vitals and nursing note reviewed.  Constitutional:      Appearance: Normal appearance.  HENT:     Head: Normocephalic and atraumatic.  Cardiovascular:     Rate and Rhythm: Normal rate and regular rhythm.  Pulmonary:     Effort: Pulmonary effort is normal. No respiratory distress.     Breath sounds: No wheezing or rales.  Abdominal:     General: There is distension.     Palpations: Abdomen is soft.  Musculoskeletal:        General: No tenderness.     Cervical back: Normal range of motion and neck supple.     Right lower leg: Edema (1+ pitting) present.     Left lower leg: Edema (1+ pitting) present.  Skin:    General: Skin is warm and dry.  Neurological:     General: No focal deficit present.     Mental Status: He is alert and oriented to person, place, and time.  Psychiatric:        Mood and Affect: Mood normal.        Behavior: Behavior normal.        Thought Content: Thought content normal.   Assessment & Plan:  1: Chronic heart failure with preserved ejection fraction with structural changes  (LVH)- - NYHA class II - euvolemic today - weighing daily; reminded to call for an overnight weight gain of > 2 pounds or a weekly weight gain of > 5 pounds - weight down 4 pounds from last visit here 3 weeks ago - does add salt to eggs but says "that's about it" - entresto stopped at last visit  due to hand pain and pain resolved although he's not sure it was related to entresto; he is willing to try it again so he was advised to resume taking it as 1 tablet BID and let me know if hand pain returns - Type 1 diabetic so not a candidate for jardiance - will check labs today since diuretic started at last visit; does feel like the torsemide is working better; could consider increasing dosage if needed - BNP 12/12/20 was 2230  - PharmD reconciled medications with the patient  2: HTN- - BP looks good (130/80) - saw PCP Danny [name]) 01/01/21 - BMP 01/28/21 reviewed and showed sodium 138, potassium 3.8, creatinine 0.96 & GFR >60  3: Type 1 DM- - saw endocrinology Danny [name]) 10/17/20 - A1c 12/13/20 was 7.2% - glucose at home today was 148  4: CAD- - saw cardiology Danny [name]) 11/19/20  5: Lymphedema- - stage 2 - has been wearing compression socks and has continued wearing them at bedtime and taking them off in the morning; instructed (again) to wear them during the day with removal at bedtime; this was also written on his AVS - encouraged to elevate his legs when sitting for long periods of time - instructed to be as active as possible with allowing for frequent rest periods - consider compression boots if edema persists   Medication bottles reviewed.   Return in 1 month or sooner for any questions/problems before then.

## 2021-02-18 ENCOUNTER — Other Ambulatory Visit: Payer: Self-pay

## 2021-02-18 ENCOUNTER — Encounter: Payer: Self-pay | Admitting: Family

## 2021-02-18 ENCOUNTER — Telehealth: Payer: Self-pay

## 2021-02-18 ENCOUNTER — Ambulatory Visit: Payer: Medicare Other | Attending: Family | Admitting: Family

## 2021-02-18 VITALS — BP 130/80 | HR 94 | Resp 18 | Ht 68.0 in | Wt 278.5 lb

## 2021-02-18 DIAGNOSIS — I2510 Atherosclerotic heart disease of native coronary artery without angina pectoris: Secondary | ICD-10-CM | POA: Diagnosis not present

## 2021-02-18 DIAGNOSIS — I10 Essential (primary) hypertension: Secondary | ICD-10-CM | POA: Diagnosis not present

## 2021-02-18 DIAGNOSIS — I25118 Atherosclerotic heart disease of native coronary artery with other forms of angina pectoris: Secondary | ICD-10-CM | POA: Diagnosis not present

## 2021-02-18 DIAGNOSIS — I6529 Occlusion and stenosis of unspecified carotid artery: Secondary | ICD-10-CM | POA: Diagnosis not present

## 2021-02-18 DIAGNOSIS — Z951 Presence of aortocoronary bypass graft: Secondary | ICD-10-CM | POA: Diagnosis not present

## 2021-02-18 DIAGNOSIS — I890 Lymphedema, not elsewhere classified: Secondary | ICD-10-CM | POA: Insufficient documentation

## 2021-02-18 DIAGNOSIS — Z955 Presence of coronary angioplasty implant and graft: Secondary | ICD-10-CM | POA: Insufficient documentation

## 2021-02-18 DIAGNOSIS — Z794 Long term (current) use of insulin: Secondary | ICD-10-CM | POA: Diagnosis not present

## 2021-02-18 DIAGNOSIS — E785 Hyperlipidemia, unspecified: Secondary | ICD-10-CM | POA: Insufficient documentation

## 2021-02-18 DIAGNOSIS — Z79899 Other long term (current) drug therapy: Secondary | ICD-10-CM | POA: Diagnosis not present

## 2021-02-18 DIAGNOSIS — I110 Hypertensive heart disease with heart failure: Secondary | ICD-10-CM | POA: Diagnosis present

## 2021-02-18 DIAGNOSIS — E109 Type 1 diabetes mellitus without complications: Secondary | ICD-10-CM | POA: Insufficient documentation

## 2021-02-18 DIAGNOSIS — I5032 Chronic diastolic (congestive) heart failure: Secondary | ICD-10-CM | POA: Diagnosis not present

## 2021-02-18 DIAGNOSIS — I252 Old myocardial infarction: Secondary | ICD-10-CM | POA: Insufficient documentation

## 2021-02-18 LAB — BASIC METABOLIC PANEL
Anion gap: 5 (ref 5–15)
BUN: 12 mg/dL (ref 8–23)
CO2: 25 mmol/L (ref 22–32)
Calcium: 8.8 mg/dL — ABNORMAL LOW (ref 8.9–10.3)
Chloride: 107 mmol/L (ref 98–111)
Creatinine, Ser: 1.31 mg/dL — ABNORMAL HIGH (ref 0.61–1.24)
GFR, Estimated: >60 mL/min (ref >60)
Glucose, Bld: 199 mg/dL — ABNORMAL HIGH (ref 70–99)
Potassium: 4 mmol/L (ref 3.5–5.1)
Sodium: 137 mmol/L (ref 135–145)

## 2021-02-18 NOTE — Patient Instructions (Addendum)
Continue weighing daily and call for an overnight weight gain of 3 pounds or more or a weekly weight gain of more than 5 pounds.      Resume Entesto as 1 tablet in the morning and 1 tablet in the evening. Let me know if the hand pain returns.    Put compression socks every morning and remove them before bedtime

## 2021-02-18 NOTE — Progress Notes (Signed)
[place] - PHARMACIST COUNSELING NOTE  Guideline-Directed Medical Therapy/Evidence Based Medicine  ACE/ARB/ARNI: Sacubitril-valsartan 24-26 mg twice daily (pt stopped taking if due to potential side effect of hand/leg cramping) Beta Blocker: Metoprolol succinate 100 mg daily (pt stopped taking in November due to doctor telling him to stop) Aldosterone Antagonist:  None Diuretic: Torsemide 20 mg daily SGLT2i:  None due to T1DM . On Chart it list type 2 DM.   Adherence Assessment  Do you ever forget to take your medication? [] Yes [x] No  Do you ever skip doses due to side effects? [] Yes [x] No  Do you have trouble affording your medicines? [] Yes [x] No  Are you ever unable to pick up your medication due to transportation difficulties? [] Yes [x] No  Do you ever stop taking your medications because you don't believe they are helping? [] Yes [x] No  Do you check your weight daily? [x] Yes [] No   Adherence: patient reports adherence to his medications.   Barriers to obtaining medications: None reported by the patient.   Vital signs: HR 94, BP 130-80, weight (pounds) 278 ECHO: Date 11/2020, EF 55-60,   BMP Latest Ref Rng & Units 01/28/2021 12/16/2020 12/15/2020  Glucose 70 - 99 mg/dL 221(H) 89 260(H)  BUN 8 - 23 mg/dL 11 31(H) 31(H)  Creatinine 0.61 - 1.24 mg/dL 0.96 1.15 1.42(H)  BUN/Creat Ratio 10 - 24 - - -  Sodium 135 - 145 mmol/L 138 134(L) 129(L)  Potassium 3.5 - 5.1 mmol/L 3.8 4.5 3.9  Chloride 98 - 111 mmol/L 107 100 93(L)  CO2 22 - 32 mmol/L 24 30 26   Calcium 8.9 - 10.3 mg/dL 8.6(L) 9.1 9.0    Past Medical History:  Diagnosis Date   Acute MI, lateral wall, initial episode of care ([place]) 03/23/2014   CAD (coronary artery disease)    a. s/p Lat MI 2/16 >> s/p CABG; b. LHC 1/17: mLAD 100, oD1 85%, oLCx 100, OM1 filled by collats from RPLB2, RPDA stent 95% ISR, L-LAD patent, S-OM1 occluded, S-D1 occluded >> PCI: Promus DES to RPDA  -  residual CAD not amenable to PCI (dist vessel/diabetic appearance)    Carotid stenosis    a. Carotid US 2/19 bilat ICA 1-39%   Chronic combined systolic and diastolic CHF (congestive heart failure) ([place])    Hyperlipidemia 10/16/2014   Hypertension    Ischemic cardiomyopathy    a. Echo at [facility] 3/16:  EF 35-40%, diff HK, mild LVH, mild LAE, mild TR;  b. Echo 7/16: mild LVH, EF 50%, inf and lat HK-AK, Gr 2 DD, mod LAE, mild RVE, reduced RVSF, trivial TR, PASP 30 mmHg // c. Echo 5/17: moderate LVH, EF 45-50%, anteroseptal HK, grade 1 diastolic dysfunction, aortic sclerosis    Type 2 diabetes mellitus ([place]) 03/23/2014    ASSESSMENT 66 year old male who presents to the HF clinic for HFpEF follow up. Overall, patient is doing well with some complaints of constipation. No heart failure symptoms reported. Pt is currently off metoprolol and entresto. Pt seems to be open to start entresto again, the pain in his hand and leg have improved but he thinks it was due to physical activities vs the medication.   PLAN HFpEF - Continue current medications - potentially can add on spironolactone in the future.  - defer SGLT2 due to T1DM  - restart entresto  - potentially restart metoprolol succ if deem appropriate by PCP or NP.     Time spent: 20 minutes  Danny [name],  Pharm.D. Clinical Pharmacist 02/18/2021 11:16 AM    Current Outpatient Medications:    aspirin EC 81 MG tablet, Take 1 tablet (81 mg total) by mouth daily., Disp: 90 tablet, Rfl: 3   cloNIDine (CATAPRES) 0.2 MG tablet, Take 0.2 mg by mouth 2 (two) times daily., Disp: , Rfl:    clopidogrel (PLAVIX) 75 MG tablet, TAKE 1 TABLET BY MOUTH DAILY. PLEASE KEEP UPCOMING APPT IN JUNE 2022 BEFORE ANYMORE REFILLS., Disp: 90 tablet, Rfl: 3   Glycerin-Hypromellose-PEG 400 ([facility] DRY EYE RELIEF) 0.2-0.2-1 % SOLN, Place 1 drop into both eyes daily as needed (dry eyes)., Disp: , Rfl:    guaiFENesin-codeine 100-10 MG/5ML syrup, Take 10 mLs by mouth every  4 (four) hours as needed for cough., Disp: 120 mL, Rfl: 0   HUMALOG KWIKPEN 200 UNIT/ML KwikPen, Inject 206 Units into the skin as directed. (Administered via insulin pump), Disp: , Rfl:    ibuprofen (ADVIL) 200 MG tablet, Take 200 mg by mouth every 6 (six) hours as needed for headache or mild pain., Disp: , Rfl:    ipratropium (ATROVENT) 0.03 % nasal spray, Place 2 sprays into both nostrils daily as needed for rhinitis., Disp: , Rfl:    ipratropium-albuterol (DUONEB) 0.5-2.5 (3) MG/3ML SOLN, Take 3 mLs by nebulization every 2 (two) hours as needed., Disp: 360 mL, Rfl: 0   isosorbide mononitrate (IMDUR) 30 MG 24 hr tablet, Take 1 tablet (30 mg total) by mouth daily., Disp: 30 tablet, Rfl: 5   metoprolol succinate (TOPROL-XL) 100 MG 24 hr tablet, TAKE 1 TABLET BY MOUTH DAILY. TAKE WITH OR IMMEDIATELY FOLLOWING A MEAL. (Patient not taking: Reported on 02/18/2021), Disp: 90 tablet, Rfl: 0   nitroGLYCERIN (NITROSTAT) 0.4 MG SL tablet, Place 1 tablet (0.4 mg total) under the tongue every 5 (five) minutes as needed for chest pain., Disp: 25 tablet, Rfl: 2   pantoprazole (PROTONIX) 40 MG tablet, Take 40 mg by mouth daily., Disp: , Rfl:    potassium chloride SA (KLOR-CON M) 20 MEQ tablet, Take 1 tablet (20 mEq total) by mouth daily., Disp: 90 tablet, Rfl: 3   rosuvastatin (CRESTOR) 40 MG tablet, TAKE 1 TABLET BY MOUTH EVERY DAY, Disp: 90 tablet, Rfl: 3   sacubitril-valsartan (ENTRESTO) 24-26 MG, Take 1 tablet by mouth 2 (two) times daily. (Patient not taking: Reported on 02/18/2021), Disp: 60 tablet, Rfl: 3   torsemide (DEMADEX) 20 MG tablet, Take 1 tablet (20 mg total) by mouth daily., Disp: 30 tablet, Rfl: 5    DRUGS TO CAUTION IN HEART FAILURE  Drug or Class Mechanism  Analgesics NSAIDs COX-2 inhibitors Glucocorticoids  Sodium and water retention, increased systemic vascular resistance, decreased response to diuretics   Diabetes Medications Metformin Thiazolidinediones Rosiglitazone  (Avandia) Pioglitazone (Actos) DPP4 Inhibitors Saxagliptin (Onglyza) Sitagliptin (Januvia)   Lactic acidosis Possible calcium channel blockade   Unknown  Antiarrhythmics Class I  Flecainide Disopyramide Class III Sotalol Other Dronedarone  Negative inotrope, proarrhythmic   Proarrhythmic, beta blockade  Negative inotrope  Antihypertensives Alpha Blockers Doxazosin Calcium Channel Blockers Diltiazem Verapamil Nifedipine Central Alpha Adrenergics Moxonidine Peripheral Vasodilators Minoxidil  Increases renin and aldosterone  Negative inotrope    Possible sympathetic withdrawal  Unknown  Anti-infective Itraconazole Amphotericin B  Negative inotrope Unknown  Hematologic Anagrelide Cilostazol   Possible inhibition of PD IV Inhibition of PD III causing arrhythmias  Neurologic/Psychiatric Stimulants Anti-Seizure Drugs Carbamazepine Pregabalin Antidepressants Tricyclics Citalopram Parkinsons Bromocriptine Pergolide Pramipexole Antipsychotics Clozapine Antimigraine Ergotamine Methysergide Appetite suppressants Bipolar Lithium  Peripheral alpha and beta agonist  activity  Negative inotrope and chronotrope Calcium channel blockade  Negative inotrope, proarrhythmic Dose-dependent QT prolongation  Excessive serotonin activity/valvular damage Excessive serotonin activity/valvular damage Unknown  IgE mediated hypersensitivy, calcium channel blockade  Excessive serotonin activity/valvular damage Excessive serotonin activity/valvular damage Valvular damage  Direct myofibrillar degeneration, adrenergic stimulation  Antimalarials Chloroquine Hydroxychloroquine Intracellular inhibition of lysosomal enzymes  Urologic Agents Alpha Blockers Doxazosin Prazosin Tamsulosin Terazosin  Increased renin and aldosterone  Adapted from Page Carleene Overlie, et al. Drugs That May Cause or Exacerbate Heart Failure: A Scientific Statement from the American Heart   Association. Circulation 2016; 134:e32-e69. DOI: 10.1161/CIR.0000000000000426   MEDICATION ADHERENCES TIPS AND STRATEGIES Taking medication as prescribed improves patient outcomes in heart failure (reduces hospitalizations, improves symptoms, increases survival) Side effects of medications can be managed by decreasing doses, switching agents, stopping drugs, or adding additional therapy. Please let someone in the [facility] know if you have having bothersome side effects so we can modify your regimen. Do not alter your medication regimen without talking to [place].  Medication reminders can help patients remember to take drugs on time. If you are missing or forgetting doses you can try linking behaviors, using pill boxes, or an electronic reminder like an alarm on your phone or an app. Some people can also get automated phone calls as medication reminders.

## 2021-02-18 NOTE — Telephone Encounter (Signed)
-----   Message from Delma Freeze, [place] sent at 02/18/2021 12:53 PM EST ----- Potassium and sodium levels are normal. Kidney function slightly up from 3 weeks ago with adding fluid pill although within his range. Continue medications and labs will be rechecked at next visit.

## 2021-03-05 ENCOUNTER — Other Ambulatory Visit: Payer: Self-pay | Admitting: Cardiovascular Disease

## 2021-03-23 ENCOUNTER — Other Ambulatory Visit: Payer: Self-pay | Admitting: Family

## 2021-03-23 ENCOUNTER — Other Ambulatory Visit: Payer: Self-pay

## 2021-03-23 ENCOUNTER — Other Ambulatory Visit
Admission: RE | Admit: 2021-03-23 | Discharge: 2021-03-23 | Disposition: A | Discharge status: Home / Self Care | Payer: Medicare Other | Source: Ambulatory Visit | Attending: Family | Admitting: Family

## 2021-03-23 ENCOUNTER — Encounter: Payer: Self-pay | Admitting: Family

## 2021-03-23 ENCOUNTER — Ambulatory Visit (HOSPITAL_BASED_OUTPATIENT_CLINIC_OR_DEPARTMENT_OTHER): Payer: Medicare Other | Admitting: Family

## 2021-03-23 VITALS — BP 151/85 | HR 74 | Resp 20 | Ht 68.0 in | Wt 280.0 lb

## 2021-03-23 DIAGNOSIS — I25118 Atherosclerotic heart disease of native coronary artery with other forms of angina pectoris: Secondary | ICD-10-CM

## 2021-03-23 DIAGNOSIS — I5032 Chronic diastolic (congestive) heart failure: Secondary | ICD-10-CM | POA: Diagnosis not present

## 2021-03-23 DIAGNOSIS — I890 Lymphedema, not elsewhere classified: Secondary | ICD-10-CM | POA: Insufficient documentation

## 2021-03-23 DIAGNOSIS — I2510 Atherosclerotic heart disease of native coronary artery without angina pectoris: Secondary | ICD-10-CM | POA: Insufficient documentation

## 2021-03-23 DIAGNOSIS — R140 Abdominal distension (gaseous): Secondary | ICD-10-CM | POA: Insufficient documentation

## 2021-03-23 DIAGNOSIS — E785 Hyperlipidemia, unspecified: Secondary | ICD-10-CM | POA: Insufficient documentation

## 2021-03-23 DIAGNOSIS — I10 Essential (primary) hypertension: Secondary | ICD-10-CM

## 2021-03-23 DIAGNOSIS — Z951 Presence of aortocoronary bypass graft: Secondary | ICD-10-CM | POA: Diagnosis not present

## 2021-03-23 DIAGNOSIS — Z794 Long term (current) use of insulin: Secondary | ICD-10-CM | POA: Insufficient documentation

## 2021-03-23 DIAGNOSIS — Z79899 Other long term (current) drug therapy: Secondary | ICD-10-CM | POA: Insufficient documentation

## 2021-03-23 DIAGNOSIS — I110 Hypertensive heart disease with heart failure: Secondary | ICD-10-CM | POA: Insufficient documentation

## 2021-03-23 DIAGNOSIS — R609 Edema, unspecified: Secondary | ICD-10-CM | POA: Insufficient documentation

## 2021-03-23 DIAGNOSIS — E109 Type 1 diabetes mellitus without complications: Secondary | ICD-10-CM

## 2021-03-23 DIAGNOSIS — I5042 Chronic combined systolic (congestive) and diastolic (congestive) heart failure: Secondary | ICD-10-CM | POA: Insufficient documentation

## 2021-03-23 DIAGNOSIS — Z955 Presence of coronary angioplasty implant and graft: Secondary | ICD-10-CM | POA: Diagnosis not present

## 2021-03-23 DIAGNOSIS — R0602 Shortness of breath: Secondary | ICD-10-CM | POA: Insufficient documentation

## 2021-03-23 DIAGNOSIS — I6529 Occlusion and stenosis of unspecified carotid artery: Secondary | ICD-10-CM | POA: Insufficient documentation

## 2021-03-23 DIAGNOSIS — I252 Old myocardial infarction: Secondary | ICD-10-CM | POA: Insufficient documentation

## 2021-03-23 LAB — BASIC METABOLIC PANEL
Anion gap: 6 (ref 5–15)
BUN: 15 mg/dL (ref 8–23)
CO2: 24 mmol/L (ref 22–32)
Calcium: 8.9 mg/dL (ref 8.9–10.3)
Chloride: 107 mmol/L (ref 98–111)
Creatinine, Ser: 1.18 mg/dL (ref 0.61–1.24)
GFR, Estimated: >60 mL/min (ref >60)
Glucose, Bld: 191 mg/dL — ABNORMAL HIGH (ref 70–99)
Potassium: 3.9 mmol/L (ref 3.5–5.1)
Sodium: 137 mmol/L (ref 135–145)

## 2021-03-23 NOTE — Progress Notes (Signed)
Patient ID: Danny [name], male    DOB: 16-Nov-1955, 66 y.o.   MRN: [id number]  Danny [name] is a 66 y/o male with a history of CAD, DM, hyperlipidemia, HTN, carotid stenosis and chronic heart failure.   Echo report from 12/13/20 reviewed and showed an EF of 55-60% along with moderate LVH and trivial Danny.   LHC done 12/12/20 and showed: Severe two-vessel coronary artery disease with chronic total occlusions of the mid LAD and ostial LCx, as noted on prior catheterizations. Mild, nonobstructive RCA disease with patent PDA stent. Widely patent LIMA-LAD bypass graft.  SVG-D1 and SVG-OM1 are known to be occluded and were not engaged on today's study. Mildly elevated left ventricular filling pressure (LVEDP 15-20 mmHg) with grossly normal left ventricular contraction.  Admitted 12/11/20 due to a cough found to have NSTEMI. Cardiology consult obtained & cath done. Initially given IV lasix with transition to oral diuretics. Treated with solu-medrol and then prednisone. Positive for RSV. Discharged after 5 days.   He presents today for a follow-up visit with a chief complaint of minimal shortness of breath upon moderate exertion. He describes this as chronic in nature having been present for several years. He has associated pedal edema, abdominal distention and chronic difficulty sleeping along with this. He denies any dizziness, palpitations, chest pain, cough, fatigue or weight gain.   He weighs daily and reports his weights do not fluctuate more than 3 lbs in either direction. Would like to lose more weight but he cannot seem to.   Past Medical History:  Diagnosis Date   Acute MI, lateral wall, initial episode of care ([place]) 03/23/2014   CAD (coronary artery disease)    a. s/p Lat MI 2/16 >> s/p CABG; b. LHC 1/17: mLAD 100, oD1 85%, oLCx 100, OM1 filled by collats from RPLB2, RPDA stent 95% ISR, L-LAD patent, S-OM1 occluded, S-D1 occluded >> PCI: Promus DES to RPDA  - residual CAD not amenable  to PCI (dist vessel/diabetic appearance)    Carotid stenosis    a. Carotid US 2/19 bilat ICA 1-39%   Chronic combined systolic and diastolic CHF (congestive heart failure) ([place])    Hyperlipidemia 10/16/2014   Hypertension    Ischemic cardiomyopathy    a. Echo at [facility] 3/16:  EF 35-40%, diff HK, mild LVH, mild LAE, mild TR;  b. Echo 7/16: mild LVH, EF 50%, inf and lat HK-AK, Gr 2 DD, mod LAE, mild RVE, reduced RVSF, trivial TR, PASP 30 mmHg // c. Echo 5/17: moderate LVH, EF 45-50%, anteroseptal HK, grade 1 diastolic dysfunction, aortic sclerosis    Type 2 diabetes mellitus ([place]) 03/23/2014   Past Surgical History:  Procedure Laterality Date   CARDIAC CATHETERIZATION N/A 02/24/2015   Procedure: Left Heart Cath and Cors/Grafts Angiography;  Surgeon: Sherren Mocha, MD;  Location: [place] [facility];  Service: Cardiovascular;  Laterality: N/A;   CARDIAC CATHETERIZATION N/A 02/24/2015   Procedure: Coronary Stent Intervention;  Surgeon: Sherren Mocha, MD;  Location: [place] [facility];  Service: Cardiovascular;  Laterality: N/A;   CORONARY ARTERY BYPASS GRAFT N/A 03/28/2014   Procedure: CORONARY ARTERY BYPASS GRAFTING (CABG);  Surgeon: Melrose Nakayama, MD;  Location: [place];  Service: Open Heart Surgery;  Laterality: N/A;   LEFT HEART CATH AND CORS/GRAFTS ANGIOGRAPHY N/A 05/07/2019   Procedure: LEFT HEART CATH AND CORS/GRAFTS ANGIOGRAPHY;  Surgeon: Sherren Mocha, MD;  Location: [place] [facility];  Service: Cardiovascular;  Laterality: N/A;   LEFT HEART CATH AND CORS/GRAFTS ANGIOGRAPHY N/A  12/12/2020   Procedure: LEFT HEART CATH AND CORS/GRAFTS ANGIOGRAPHY;  Surgeon: Nelva Bush, MD;  Location: [place] [facility];  Service: Cardiovascular;  Laterality: N/A;   LEFT HEART CATHETERIZATION WITH CORONARY ANGIOGRAM N/A 03/23/2014   Procedure: LEFT HEART CATHETERIZATION WITH CORONARY ANGIOGRAM;  Surgeon: Blane Ohara, MD;  Location: [facility] C[facility] LAB;  Service: Cardiovascular;  Laterality: N/A;    PERCUTANEOUS CORONARY STENT INTERVENTION (PCI-S)  02/24/2015   PDA   TEE WITHOUT CARDIOVERSION N/A 03/28/2014   Procedure: TRANSESOPHAGEAL ECHOCARDIOGRAM (TEE);  Surgeon: Melrose Nakayama, MD;  Location: [place];  Service: Open Heart Surgery;  Laterality: N/A;   Family History  Problem Relation Age of Onset   Healthy Mother        No history of CAD   Healthy Father        No history of CAD   Heart attack Father    Diabetes Brother    Stroke Neg Hx    Social History   Tobacco Use   Smoking status: Never   Smokeless tobacco: Never  Substance Use Topics   Alcohol use: Yes   Allergies  Allergen Reactions   Spironolactone Other (See Comments)    Other reaction(s): Unknown Increased creatinine Increased creatinine   Prior to Admission medications   Medication Sig Start Date End Date Taking? Authorizing Provider  aspirin EC 81 MG tablet Take 1 tablet (81 mg total) by mouth daily. 04/30/19  Yes Sherren Mocha, MD  cloNIDine (CATAPRES) 0.2 MG tablet Take 0.2 mg by mouth 2 (two) times daily.   Yes [provider]  clopidogrel (PLAVIX) 75 MG tablet TAKE 1 TABLET BY MOUTH DAILY. PLEASE KEEP UPCOMING APPT IN JUNE 2022 BEFORE ANYMORE REFILLS. 08/25/20  Yes Sherren Mocha, MD  Glycerin-Hypromellose-PEG 400 ([employer] DRY EYE RELIEF) 0.2-0.2-1 % SOLN Place 1 drop into both eyes daily as needed (dry eyes).   Yes [provider]  guaiFENesin-codeine 100-10 MG/5ML syrup Take 10 mLs by mouth every 4 (four) hours as needed for cough. 12/16/20  Yes Allie Bossier, MD  HUMALOG KWIKPEN 200 UNIT/ML KwikPen Inject 206 Units into the skin as directed. (Administered via insulin pump)   Yes [provider]  ibuprofen (ADVIL) 200 MG tablet Take 200 mg by mouth every 6 (six) hours as needed for headache or mild pain.   Yes [provider]  ipratropium (ATROVENT) 0.03 % nasal spray Place 2 sprays into both nostrils daily as needed for rhinitis.   Yes [provider]   ipratropium-albuterol (DUONEB) 0.5-2.5 (3) MG/3ML SOLN Take 3 mLs by nebulization every 2 (two) hours as needed. 12/16/20  Yes Allie Bossier, MD  isosorbide mononitrate (IMDUR) 30 MG 24 hr tablet Take 1 tablet (30 mg total) by mouth daily. 08/08/20  Yes Loel Dubonnet, NP  nitroGLYCERIN (NITROSTAT) 0.4 MG SL tablet Place 1 tablet (0.4 mg total) under the tongue every 5 (five) minutes as needed for chest pain. 04/27/19  Yes Sherren Mocha, MD  pantoprazole (PROTONIX) 40 MG tablet Take 40 mg by mouth daily. 10/18/20  Yes [provider]  potassium chloride SA (KLOR-CON M) 20 MEQ tablet Take 1 tablet (20 mEq total) by mouth daily. 01/28/21  Yes Darylene Price A, FNP  rosuvastatin (CRESTOR) 40 MG tablet TAKE 1 TABLET BY MOUTH EVERY DAY 05/19/20  Yes Richardson Dopp T, PA-C  torsemide (DEMADEX) 20 MG tablet Take 1 tablet (20 mg total) by mouth daily. 01/28/21 02/27/21 Yes Darylene Price A, FNP  metoprolol succinate (TOPROL-XL) 100  MG 24 hr tablet TAKE 1 TABLET BY MOUTH DAILY. TAKE WITH OR IMMEDIATELY FOLLOWING A MEAL. Patient not taking: Reported on 02/18/2021 12/22/20   Sherren Mocha, MD  sacubitril-valsartan (ENTRESTO) 24-26 MG Take 1 tablet by mouth 2 (two) times daily. Patient not taking: Reported on 02/18/2021 12/26/20   Alisa Graff, FNP   Review of Systems  Constitutional:  Positive for fatigue. Negative for appetite change.  HENT:  Negative for congestion, postnasal drip and sore throat.   Eyes: Negative.   Respiratory:  Positive for shortness of breath (with moderate exertion). Negative for cough, chest tightness and wheezing.   Cardiovascular:  Positive for leg swelling. Negative for chest pain and palpitations.  Gastrointestinal:  Positive for abdominal distention. Negative for abdominal pain.  Endocrine: Negative.   Genitourinary: Negative.   Musculoskeletal:  Negative for arthralgias, back pain, myalgias and neck pain.  Skin: Negative.   Allergic/Immunologic: Negative.    Neurological:  Negative for dizziness, weakness and light-headedness.  Hematological:  Negative for adenopathy. Does not bruise/bleed easily.  Psychiatric/Behavioral:  Positive for sleep disturbance (wake frequently; sleeping on 1 pillow). Negative for dysphoric mood. The patient is not nervous/anxious.    Vitals:   03/23/21 1124 03/23/21 1202  BP: (!) 173/99 (!) 151/85  Pulse: 74   Resp: 20   SpO2: 98%   Weight: 280 lb (127 kg)   Height: 5\' 8"  (1.727 m)    Wt Readings from Last 3 Encounters:  03/23/21 280 lb (127 kg)  02/18/21 278 lb 8 oz (126.3 kg)  01/28/21 282 lb 6 oz (128.1 kg)   Lab Results  Component Value Date   CREATININE 1.18 03/23/2021   CREATININE 1.31 (H) 02/18/2021   CREATININE 0.96 01/28/2021   Physical Exam Vitals and nursing note reviewed.  Constitutional:      General: He is not in acute distress.    Appearance: Normal appearance.  HENT:     Head: Normocephalic and atraumatic.  Cardiovascular:     Rate and Rhythm: Normal rate and regular rhythm.     Pulses: Normal pulses.     Heart sounds: No murmur heard. Pulmonary:     Effort: Pulmonary effort is normal. No respiratory distress.     Breath sounds: No wheezing or rales.  Abdominal:     General: There is distension.     Palpations: Abdomen is soft.  Musculoskeletal:        General: No tenderness.     Cervical back: Normal range of motion and neck supple.     Right lower leg: Edema (1+ pitting) present.     Left lower leg: Edema (1+ pitting) present.  Skin:    General: Skin is warm and dry.  Neurological:     General: No focal deficit present.     Mental Status: He is alert and oriented to person, place, and time.  Psychiatric:        Mood and Affect: Mood normal.        Behavior: Behavior normal.        Thought Content: Thought content normal.   Assessment & Plan:  1: Chronic heart failure with preserved ejection fraction with structural changes (LVH)- - NYHA class II - euvolemic  today - weighing daily; reminded to call for an overnight weight gain of > 2 pounds or a weekly weight gain of > 5 pounds - weight up 2 lbs from last visit her a month ago - says he feels best when his fluid intake is ~  24 ounces/day  - trying to adhere to a low sodium diet - entresto resumed at last visit, will check BMP today  - Type 1 diabetic so not a candidate for jardiance - BNP 12/12/20 was 2230   2: HTN- - BP 173/99, rechecked 151/85 - has been out of his clonidine and imdur for several days, but will pick up today - saw PCP Payton Mccallum) 01/01/21 - BMP 01/28/21 reviewed and showed sodium 138, potassium 3.8, creatinine 0.96 & GFR >60  3: Type 1 DM- - saw endocrinology Honor Junes) 10/17/20 - A1c 02/23/21 was 7.2%  4: CAD- - will see Cooper on 04/13/21  5: Lymphedema- - stage 2 - has been wearing compression socks for about 4 hours/day but even the lightest compression causes tingling and burning after 4 hours - encouraged to elevate his legs when sitting for long periods of time - instructed to be as active as possible with allowing for frequent rest periods - appear better today    Medication bottles reviewed.   Return in 3 months or sooner for any questions/problems before then.

## 2021-03-23 NOTE — Patient Instructions (Signed)
[facility] will be moving around the corner to [id number] mid-February. Our phone number will remain the same ° °

## 2021-03-30 ENCOUNTER — Telehealth: Payer: Self-pay | Admitting: Family

## 2021-03-30 NOTE — Telephone Encounter (Signed)
Patient has been approved for [employer] patient assistance for Entresto until 02/14/22.   Jaeli Grubb, NT

## 2021-04-13 ENCOUNTER — Other Ambulatory Visit: Payer: Self-pay

## 2021-04-13 ENCOUNTER — Encounter: Payer: Self-pay | Admitting: Cardiovascular Disease

## 2021-04-13 ENCOUNTER — Ambulatory Visit: Payer: Medicare Other | Admitting: Cardiovascular Disease

## 2021-04-13 VITALS — BP 146/92 | HR 73 | Ht 68.0 in | Wt 283.0 lb

## 2021-04-13 DIAGNOSIS — I10 Essential (primary) hypertension: Secondary | ICD-10-CM | POA: Diagnosis not present

## 2021-04-13 DIAGNOSIS — E782 Mixed hyperlipidemia: Secondary | ICD-10-CM

## 2021-04-13 DIAGNOSIS — I5032 Chronic diastolic (congestive) heart failure: Secondary | ICD-10-CM | POA: Diagnosis not present

## 2021-04-13 DIAGNOSIS — I25118 Atherosclerotic heart disease of native coronary artery with other forms of angina pectoris: Secondary | ICD-10-CM

## 2021-04-13 NOTE — Progress Notes (Signed)
Cardiology Office Note:    Date:  04/13/2021   ID:  Danny [name], Danny [name] [birth year], MRN [id number]  PCP:  Latanya Maudlin, NP   [facility] Providers Cardiologist:  Sherren Mocha, MD     Referring MD: Latanya Maudlin, NP   Chief Complaint  Patient presents with   Coronary Artery Disease    History of Present Illness:    Danny [name] is a 66 y.o. male with a hx of HFrEF, ICM, DM2, HTN, HLD, carotid artery disease, CAD s/p CABG in 2016 with cath 02/2015 showing S-OM and S-Dx occluded and DES to RPDA presents today for follow-up evaluation.  Cardiac catheterization in 2022 showed stable findings with no significant change in coronary anatomy.  The patient is here alone today.  He continues to have exertional dyspnea.  Otherwise he has no acute complaints.  His breathing has not changed in recent months.  He has been under close care at the advanced heart failure clinic at [facility] by Darylene Price, [place].  He is now on Entresto at low-dose and has been able to get this through a program to help with the cost of medicine.  He seems to be tolerating it well.  He still complains of ankle edema.  No orthopnea, PND, or heart palpitations.  No chest pain or pressure.  He is getting out and walking his dog about 3-4 times per day.  Otherwise no formal exercise reported.  We went over his weights today.  He has gained about 80 pounds since he had bypass surgery and 2016.  Past Medical History:  Diagnosis Date   Acute MI, lateral wall, initial episode of care ([place]) 03/23/2014   CAD (coronary artery disease)    a. s/p Lat MI 2/16 >> s/p CABG; b. LHC 1/17: mLAD 100, oD1 85%, oLCx 100, OM1 filled by collats from RPLB2, RPDA stent 95% ISR, L-LAD patent, S-OM1 occluded, S-D1 occluded >> PCI: Promus DES to RPDA  - residual CAD not amenable to PCI (dist vessel/diabetic appearance)    Carotid stenosis    a. Carotid US 2/19 bilat ICA 1-39%   Chronic combined  systolic and diastolic CHF (congestive heart failure) ([place])    Hyperlipidemia 10/16/2014   Hypertension    Ischemic cardiomyopathy    a. Echo at [facility] 3/16:  EF 35-40%, diff HK, mild LVH, mild LAE, mild TR;  b. Echo 7/16: mild LVH, EF 50%, inf and lat HK-AK, Gr 2 DD, mod LAE, mild RVE, reduced RVSF, trivial TR, PASP 30 mmHg // c. Echo 5/17: moderate LVH, EF 45-50%, anteroseptal HK, grade 1 diastolic dysfunction, aortic sclerosis    Type 2 diabetes mellitus ([place]) 03/23/2014    Past Surgical History:  Procedure Laterality Date   CARDIAC CATHETERIZATION N/A 02/24/2015   Procedure: Left Heart Cath and Cors/Grafts Angiography;  Surgeon: Sherren Mocha, MD;  Location: [place] CV LAB;  Service: Cardiovascular;  Laterality: N/A;   CARDIAC CATHETERIZATION N/A 02/24/2015   Procedure: Coronary Stent Intervention;  Surgeon: Sherren Mocha, MD;  Location: [place] CV LAB;  Service: Cardiovascular;  Laterality: N/A;   CORONARY ARTERY BYPASS GRAFT N/A 03/28/2014   Procedure: CORONARY ARTERY BYPASS GRAFTING (CABG);  Surgeon: Melrose Nakayama, MD;  Location: [place];  Service: Open Heart Surgery;  Laterality: N/A;   LEFT HEART CATH AND CORS/GRAFTS ANGIOGRAPHY N/A 05/07/2019   Procedure: LEFT HEART CATH AND CORS/GRAFTS ANGIOGRAPHY;  Surgeon: Sherren Mocha, MD;  Location: [place] CV LAB;  Service: Cardiovascular;  Laterality: N/A;   LEFT HEART CATH AND CORS/GRAFTS ANGIOGRAPHY N/A 12/12/2020   Procedure: LEFT HEART CATH AND CORS/GRAFTS ANGIOGRAPHY;  Surgeon: Nelva Bush, MD;  Location: [place] CV LAB;  Service: Cardiovascular;  Laterality: N/A;   LEFT HEART CATHETERIZATION WITH CORONARY ANGIOGRAM N/A 03/23/2014   Procedure: LEFT HEART CATHETERIZATION WITH CORONARY ANGIOGRAM;  Surgeon: Blane Ohara, MD;  Location: [facility] CATH LAB;  Service: Cardiovascular;  Laterality: N/A;   PERCUTANEOUS CORONARY STENT INTERVENTION (PCI-S)  02/24/2015   PDA   TEE WITHOUT CARDIOVERSION N/A 03/28/2014   Procedure:  TRANSESOPHAGEAL ECHOCARDIOGRAM (TEE);  Surgeon: Melrose Nakayama, MD;  Location: [place];  Service: Open Heart Surgery;  Laterality: N/A;    Current Medications: Current Meds  Medication Sig   aspirin EC 81 MG tablet Take 1 tablet (81 mg total) by mouth daily.   benzonatate (TESSALON) 200 MG capsule Take 200 mg by mouth 3 (three) times daily as needed.   cloNIDine (CATAPRES) 0.2 MG tablet Take 0.2 mg by mouth 2 (two) times daily.   clopidogrel (PLAVIX) 75 MG tablet TAKE 1 TABLET BY MOUTH DAILY. PLEASE KEEP UPCOMING APPT IN JUNE 2022 BEFORE ANYMORE REFILLS.   Glycerin-Hypromellose-PEG 400 ([employer] DRY EYE RELIEF) 0.2-0.2-1 % SOLN Place 1 drop into both eyes daily as needed (dry eyes).   guaiFENesin-codeine 100-10 MG/5ML syrup Take 10 mLs by mouth every 4 (four) hours as needed for cough.   HUMALOG KWIKPEN 200 UNIT/ML KwikPen Inject 206 Units into the skin as directed. (Administered via insulin pump)   ibuprofen (ADVIL) 200 MG tablet Take 200 mg by mouth every 6 (six) hours as needed for headache or mild pain.   ipratropium (ATROVENT) 0.03 % nasal spray Place 2 sprays into both nostrils daily as needed for rhinitis.   ipratropium-albuterol (DUONEB) 0.5-2.5 (3) MG/3ML SOLN Take 3 mLs by nebulization every 2 (two) hours as needed.   isosorbide mononitrate (IMDUR) 30 MG 24 hr tablet TAKE 1 TABLET BY MOUTH EVERY DAY   metoprolol succinate (TOPROL-XL) 100 MG 24 hr tablet Take 1 tablet (100 mg total) by mouth daily. Please keep upcoming appt.in Feb, To receive future refills. Thank you.   nitroGLYCERIN (NITROSTAT) 0.4 MG SL tablet Place 1 tablet (0.4 mg total) under the tongue every 5 (five) minutes as needed for chest pain.   pantoprazole (PROTONIX) 40 MG tablet Take 40 mg by mouth daily.   potassium chloride SA (KLOR-CON M) 20 MEQ tablet Take 1 tablet (20 mEq total) by mouth daily.   rosuvastatin (CRESTOR) 40 MG tablet TAKE 1 TABLET BY MOUTH EVERY DAY   sacubitril-valsartan (ENTRESTO) 24-26 MG Take 1  tablet by mouth 2 (two) times daily.   torsemide (DEMADEX) 20 MG tablet Take 1 tablet (20 mg total) by mouth daily.     Allergies:   Spironolactone   Social History   Socioeconomic History   Marital status: Single    Spouse name: Not on file   Number of children: Not on file   Years of education: Not on file   Highest education level: Not on file  Occupational History   Not on file  Tobacco Use   Smoking status: Never   Smokeless tobacco: Never  Vaping Use   Vaping Use: Never used  Substance and Sexual Activity   Alcohol use: Yes   Drug use: No   Sexual activity: Not on file  Other Topics Concern   Not on file  Social History Narrative   The patient is married. He has grown  children. He is a lifelong nonsmoker. He works in Theatre manager.   Social Determinants of Health   Financial Resource Strain: Not on file  Food Insecurity: Not on file  Transportation Needs: Not on file  Physical Activity: Not on file  Stress: Not on file  Social Connections: Not on file     Family History: The patient's family history includes Diabetes in his brother; Healthy in his father and mother; Heart attack in his father. There is no history of Stroke.  ROS:   Please see the history of present illness.    All other systems reviewed and are negative.  EKGs/Labs/Other Studies Reviewed:    The following studies were reviewed today: Echo 12-13-20: 1. Moderate hypertrophy of the basal septum with otherwise mild  concentric LVH. Left ventricular ejection fraction, by estimation, is 55  to 60%. The left ventricle has normal function. The left ventricle has no  regional wall motion abnormalities. There  is moderate left ventricular hypertrophy of the basal-septal segment. Left  ventricular diastolic parameters are consistent with Grade II diastolic  dysfunction (pseudonormalization). Elevated left ventricular end-diastolic  pressure.   2. Right ventricular systolic function is normal. The  right ventricular  size is normal.   3. The mitral valve is normal in structure. Trivial mitral valve  regurgitation. No evidence of mitral stenosis.   4. The aortic valve is tricuspid. Aortic valve regurgitation is trivial.  No aortic stenosis is present.   5. The inferior vena cava is normal in size with greater than 50%  respiratory variability, suggesting right atrial pressure of 3 mmHg.   Cardiac Cath 12-12-20: Conclusions: Severe two-vessel coronary artery disease with chronic total occlusions of the mid LAD and ostial LCx, as noted on prior catheterizations. Mild, nonobstructive RCA disease with patent PDA stent. Widely patent LIMA-LAD bypass graft.  SVG-D1 and SVG-OM1 are known to be occluded and were not engaged on today's study. Mildly elevated left ventricular filling pressure (LVEDP 15-20 mmHg) with grossly normal left ventricular contraction.   Recommendations: No culprit lesion for patient's elevated troponin identified.  There may be slight worsening of disease involving distal D1 branch.  I suspect his troponin elevation reflects supply-demand mismatch in the setting of respiratory illness and significant fixed CAD.  Continue secondary prevention of coronary artery disease. Maintain net even to slightly negative fluid balance. Further work-up and management of acute respiratory illness per internal medicine.  I do not believe the patient's coronary artery disease and mildly elevated filling pressures explain his symptoms and worsening hypoxia.  EKG:  EKG is not ordered today.    Recent Labs: 08/08/2020: NT-Pro BNP 141 12/09/2020: B Natriuretic Peptide 82.7 12/16/2020: ALT 42; Hemoglobin 15.0; Magnesium 2.7; Platelets 270 03/23/2021: BUN 15; Creatinine, Ser 1.18; Potassium 3.9; Sodium 137  Recent Lipid Panel    Component Value Date/Time   CHOL 91 12/12/2020 0612   CHOL 123 09/02/2016 0846   TRIG 102 12/12/2020 0612   HDL 33 (L) 12/12/2020 0612   HDL 45 09/02/2016 0846    CHOLHDL 2.8 12/12/2020 0612   VLDL 20 12/12/2020 0612   LDLCALC 38 12/12/2020 0612   LDLCALC 57 09/02/2016 0846     Risk Assessment/Calculations:           Physical Exam:    VS:  BP (!) 146/92    Pulse 73    Ht 5\' 8"  (1.727 m)    Wt 283 lb (128.4 kg)    SpO2 95%    BMI 43.03 kg/m  Wt Readings from Last 3 Encounters:  04/13/21 283 lb (128.4 kg)  03/23/21 280 lb (127 kg)  02/18/21 278 lb 8 oz (126.3 kg)     GEN:  Well nourished, well developed obese male in no acute distress HEENT: Normal NECK: No JVD; No carotid bruits LYMPHATICS: No lymphadenopathy CARDIAC: RRR, no murmurs, rubs, gallops RESPIRATORY:  Clear to auscultation without rales, wheezing or rhonchi  ABDOMEN: Soft, non-tender, non-distended MUSCULOSKELETAL:  1+ bilateral ankle edema; No deformity  SKIN: Warm and dry NEUROLOGIC:  Alert and oriented x 3 PSYCHIATRIC:  Normal affect   ASSESSMENT:    1. Chronic diastolic congestive heart failure (HCC)   2. Essential hypertension   3. Coronary artery disease of native artery of native heart with stable angina pectoris (HCC)   4. Mixed hyperlipidemia    PLAN:    In order of problems listed above:  The patient appears clinically stable on his current medical program.  I recommended that he increase Entresto, but he just refilled a 90-day supply at his current dose.  It would be cost prohibitive to change this.  I will ask him to follow-up with the heart failure clinic at [facility] where he is followed closely by Clarisa Kindred, FNP and his dose could be increased when he sees her back in a few months.  Otherwise no changes are recommended today.  We had a lengthy discussion about lifestyle modification, and increased physical activity, and dietary changes.  I suspect obesity is the primary driver of his shortness of breath. He will continue his current medicines which include clonidine, isosorbide, metoprolol succinate, and Entresto.  Would consider increasing Entresto  in the future, discussion as above.  Discussed sodium restriction.  Discussed lifestyle modification. Doing well with no angina.  I reviewed his most recent cardiac catheterization study demonstrating continued patency of the LIMA to LAD graft and stable coronary disease elsewhere.  His vein grafts are known to be occluded. Treated with a high intensity statin drug (rosuvastatin 40 mg) last lipid panel with a cholesterol of 91, HDL 33, LDL 38.     Medication Adjustments/Labs and Tests Ordered: Current medicines are reviewed at length with the patient today.  Concerns regarding medicines are outlined above.  No orders of the defined types were placed in this encounter.  No orders of the defined types were placed in this encounter.   Patient Instructions  Medication Instructions:  Your physician recommends that you continue on your current medications as directed. Please refer to the Current Medication list given to you today.  *If you need a refill on your cardiac medications before your next appointment, please call your pharmacy*   Lab Work: NONE If you have labs (blood work) drawn today and your tests are completely normal, you will receive your results only by: MyChart Message (if you have MyChart) OR A paper copy in the mail If you have any lab test that is abnormal or we need to change your treatment, we will call you to review the results.   Testing/Procedures: NONE   Follow-Up: At [facility], you and your health needs are our priority.  As part of our continuing mission to provide you with exceptional heart care, we have created designated Provider Care Teams.  These Care Teams include your primary Cardiologist (physician) and Advanced Practice Providers (APPs -  Physician Assistants and Nurse Practitioners) who all work together to provide you with the care you need, when you need it.  Your next appointment:  1 year(s)  The format for your next appointment:   In  Person  Provider:   Sherren Mocha, MD        Signed, Sherren Mocha, MD  04/13/2021 5:30 PM    [place]

## 2021-04-13 NOTE — Patient Instructions (Signed)
Medication Instructions:  ?Your physician recommends that you continue on your current medications as directed. Please refer to the Current Medication list given to you today. ? ?*If you need a refill on your cardiac medications before your next appointment, please call your pharmacy* ? ? ?Lab Work: ?NONE ?If you have labs (blood work) drawn today and your tests are completely normal, you will receive your results only by: ?MyChart Message (if you have MyChart) OR ?A paper copy in the mail ?If you have any lab test that is abnormal or we need to change your treatment, we will call you to review the results. ? ? ?Testing/Procedures: ?NONE ? ? ?Follow-Up: ?At [facility], you and your health needs are our priority.  As part of our continuing mission to provide you with exceptional heart care, we have created designated Provider Care Teams.  These Care Teams include your primary Cardiologist (physician) and Advanced Practice Providers (APPs -  Physician Assistants and Nurse Practitioners) who all work together to provide you with the care you need, when you need it. ? ?Your next appointment:   ?1 year(s) ? ?The format for your next appointment:   ?In Person ? ?Provider:   ?Michael Cooper, MD   ? ? ?  ?

## 2021-04-28 ENCOUNTER — Other Ambulatory Visit: Payer: Self-pay | Admitting: Physician Assistant

## 2021-06-01 ENCOUNTER — Other Ambulatory Visit: Payer: Self-pay | Admitting: Cardiovascular Disease

## 2021-06-22 ENCOUNTER — Encounter: Payer: Self-pay | Admitting: Family

## 2021-06-22 ENCOUNTER — Ambulatory Visit: Payer: Medicare Other | Attending: Family | Admitting: Family

## 2021-06-22 VITALS — BP 141/69 | HR 73 | Resp 20 | Ht 68.0 in | Wt 271.0 lb

## 2021-06-22 DIAGNOSIS — I5032 Chronic diastolic (congestive) heart failure: Secondary | ICD-10-CM | POA: Insufficient documentation

## 2021-06-22 DIAGNOSIS — Z79899 Other long term (current) drug therapy: Secondary | ICD-10-CM | POA: Diagnosis not present

## 2021-06-22 DIAGNOSIS — I25118 Atherosclerotic heart disease of native coronary artery with other forms of angina pectoris: Secondary | ICD-10-CM

## 2021-06-22 DIAGNOSIS — I252 Old myocardial infarction: Secondary | ICD-10-CM | POA: Insufficient documentation

## 2021-06-22 DIAGNOSIS — I10 Essential (primary) hypertension: Secondary | ICD-10-CM

## 2021-06-22 DIAGNOSIS — Z951 Presence of aortocoronary bypass graft: Secondary | ICD-10-CM | POA: Insufficient documentation

## 2021-06-22 DIAGNOSIS — I890 Lymphedema, not elsewhere classified: Secondary | ICD-10-CM | POA: Insufficient documentation

## 2021-06-22 DIAGNOSIS — I110 Hypertensive heart disease with heart failure: Secondary | ICD-10-CM | POA: Insufficient documentation

## 2021-06-22 DIAGNOSIS — E109 Type 1 diabetes mellitus without complications: Secondary | ICD-10-CM | POA: Insufficient documentation

## 2021-06-22 DIAGNOSIS — Z955 Presence of coronary angioplasty implant and graft: Secondary | ICD-10-CM | POA: Diagnosis not present

## 2021-06-22 DIAGNOSIS — I2510 Atherosclerotic heart disease of native coronary artery without angina pectoris: Secondary | ICD-10-CM | POA: Insufficient documentation

## 2021-06-22 DIAGNOSIS — E785 Hyperlipidemia, unspecified: Secondary | ICD-10-CM | POA: Insufficient documentation

## 2021-06-22 DIAGNOSIS — Z7982 Long term (current) use of aspirin: Secondary | ICD-10-CM | POA: Diagnosis not present

## 2021-06-22 DIAGNOSIS — Z7902 Long term (current) use of antithrombotics/antiplatelets: Secondary | ICD-10-CM | POA: Insufficient documentation

## 2021-06-22 NOTE — Progress Notes (Signed)
? Patient ID: Danny [name], male    DOB: 1955/09/05, 66 y.o.   MRN: [id number] ? ?HPI ? ?Mr Pautsch is a 66 y/o male with a history of CAD, DM, hyperlipidemia, HTN, carotid stenosis and chronic heart failure.  ? ?Echo report from 12/13/20 reviewed and showed an EF of 55-60% along with moderate LVH and trivial MR.  ? ?LHC done 12/12/20 and showed: ?Severe two-vessel coronary artery disease with chronic total occlusions of the mid LAD and ostial LCx, as noted on prior catheterizations. ?Mild, nonobstructive RCA disease with patent PDA stent. ?Widely patent LIMA-LAD bypass graft.  SVG-D1 and SVG-OM1 are known to be occluded and were not engaged on today's study. ?Mildly elevated left ventricular filling pressure (LVEDP 15-20 mmHg) with grossly normal left ventricular contraction. ? ?Admitted 12/11/20 due to a cough found to have NSTEMI. Cardiology consult obtained & cath done. Initially given IV lasix with transition to oral diuretics. Treated with solu-medrol and then prednisone. Positive for RSV. Discharged after 5 days.  ? ?He presents today for a follow-up visit with a chief complaint of minimal shortness of breath upon moderate exertion. Describes this as chronic in nature. He has associated left ankle edema and chronic difficulty sleeping along with this. He denies any dizziness, cough, chest pain, palpitations, abdominal distention, fatigue or weight gain.  ? ?Brought his medication bottles with him but doesn't have clonidine with him and he says that he brought everything with him that he's taking.  ? ?Just received a 3 month supply of entresto that he gets through [employer] patient assistance.  ? ?Past Medical History:  ?Diagnosis Date  ? Acute MI, lateral wall, initial episode of care ([place]) 03/23/2014  ? CAD (coronary artery disease)   ? a. s/p Lat MI 2/16 >> s/p CABG; b. LHC 1/17: mLAD 100, oD1 85%, oLCx 100, OM1 filled by collats from RPLB2, RPDA stent 95% ISR, L-LAD patent, S-OM1 occluded, S-D1  occluded >> PCI: Promus DES to RPDA  - residual CAD not amenable to PCI (dist vessel/diabetic appearance)   ? Carotid stenosis   ? a. Carotid US 2/19 bilat ICA 1-39%  ? Chronic combined systolic and diastolic CHF (congestive heart failure) ([place])   ? Hyperlipidemia 10/16/2014  ? Hypertension   ? Ischemic cardiomyopathy   ? a. Echo at [facility] 3/16:  EF 35-40%, diff HK, mild LVH, mild LAE, mild TR;  b. Echo 7/16: mild LVH, EF 50%, inf and lat HK-AK, Gr 2 DD, mod LAE, mild RVE, reduced RVSF, trivial TR, PASP 30 mmHg // c. Echo 5/17: moderate LVH, EF 45-50%, anteroseptal HK, grade 1 diastolic dysfunction, aortic sclerosis   ? Type 2 diabetes mellitus ([place]) 03/23/2014  ? ?Past Surgical History:  ?Procedure Laterality Date  ? CARDIAC CATHETERIZATION N/A 02/24/2015  ? Procedure: Left Heart Cath and Cors/Grafts Angiography;  Surgeon: Sherren Mocha, MD;  Location: [place] CV LAB;  Service: Cardiovascular;  Laterality: N/A;  ? CARDIAC CATHETERIZATION N/A 02/24/2015  ? Procedure: Coronary Stent Intervention;  Surgeon: Sherren Mocha, MD;  Location: [place] CV LAB;  Service: Cardiovascular;  Laterality: N/A;  ? CORONARY ARTERY BYPASS GRAFT N/A 03/28/2014  ? Procedure: CORONARY ARTERY BYPASS GRAFTING (CABG);  Surgeon: Melrose Nakayama, MD;  Location: [place];  Service: Open Heart Surgery;  Laterality: N/A;  ? LEFT HEART CATH AND CORS/GRAFTS ANGIOGRAPHY N/A 05/07/2019  ? Procedure: LEFT HEART CATH AND CORS/GRAFTS ANGIOGRAPHY;  Surgeon: Sherren Mocha, MD;  Location: [place] CV LAB;  Service: Cardiovascular;  Laterality: N/A;  ?  LEFT HEART CATH AND CORS/GRAFTS ANGIOGRAPHY N/A 12/12/2020  ? Procedure: LEFT HEART CATH AND CORS/GRAFTS ANGIOGRAPHY;  Surgeon: Yvonne Kendall, MD;  Location: [facility] INVASIVE CV LAB;  Service: Cardiovascular;  Laterality: N/A;  ? LEFT HEART CATHETERIZATION WITH CORONARY ANGIOGRAM N/A 03/23/2014  ? Procedure: LEFT HEART CATHETERIZATION WITH CORONARY ANGIOGRAM;  Surgeon: Micheline Chapman, MD;  Location:  [facility] CATH LAB;  Service: Cardiovascular;  Laterality: N/A;  ? PERCUTANEOUS CORONARY STENT INTERVENTION (PCI-S)  02/24/2015  ? PDA  ? TEE WITHOUT CARDIOVERSION N/A 03/28/2014  ? Procedure: TRANSESOPHAGEAL ECHOCARDIOGRAM (TEE);  Surgeon: Loreli Slot, MD;  Location: [facility] OR;  Service: Open Heart Surgery;  Laterality: N/A;  ? ?Family History  ?Problem Relation Age of Onset  ? Healthy Mother   ?     No history of CAD  ? Healthy Father   ?     No history of CAD  ? Heart attack Father   ? Diabetes Brother   ? Stroke Neg Hx   ? ?Social History  ? ?Tobacco Use  ? Smoking status: Never  ? Smokeless tobacco: Never  ?Substance Use Topics  ? Alcohol use: Yes  ? ?Allergies  ?Allergen Reactions  ? Spironolactone Other (See Comments)  ?  Other reaction(s): Unknown ?Increased creatinine ?Increased creatinine  ? ?Prior to Admission medications   ?Medication Sig Start Date End Date Taking? Authorizing Provider  ?aspirin EC 81 MG tablet Take 1 tablet (81 mg total) by mouth daily. 04/30/19  Yes Tonny Bollman, MD  ?clopidogrel (PLAVIX) 75 MG tablet TAKE 1 TABLET BY MOUTH DAILY. PLEASE KEEP UPCOMING APPT IN JUNE 2022 BEFORE ANYMORE REFILLS. 08/25/20  Yes Tonny Bollman, MD  ?Glycerin-Hypromellose-PEG 400 ([employer] DRY EYE RELIEF) 0.2-0.2-1 % SOLN Place 1 drop into both eyes daily as needed (dry eyes).   Yes [provider]  ?HUMALOG KWIKPEN 200 UNIT/ML KwikPen Inject 206 Units into the skin as directed. (Administered via insulin pump)   Yes [provider]  ?ibuprofen (ADVIL) 200 MG tablet Take 200 mg by mouth every 6 (six) hours as needed for headache or mild pain.   Yes [provider]  ?isosorbide mononitrate (IMDUR) 30 MG 24 hr tablet TAKE 1 TABLET BY MOUTH EVERY DAY 03/23/21  Yes Alver Sorrow, NP  ?metoprolol succinate (TOPROL-XL) 100 MG 24 hr tablet TAKE 1 TABLET BY MOUTH DAILY. PLEASE KEEP UPCOMING APPT IN FEB TO RECEIVE FUTURE REFILLS. THANK YOU. 06/02/21  Yes Tonny Bollman, MD  ?pantoprazole  (PROTONIX) 40 MG tablet Take 40 mg by mouth daily. 10/18/20  Yes [provider]  ?potassium chloride SA (KLOR-CON M) 20 MEQ tablet Take 1 tablet (20 mEq total) by mouth daily. 01/28/21  Yes Delma Freeze, FNP  ?rosuvastatin (CRESTOR) 40 MG tablet TAKE 1 TABLET BY MOUTH EVERY DAY 04/29/21  Yes Weaver, Scott T, PA-C  ?sacubitril-valsartan (ENTRESTO) 24-26 MG Take 1 tablet by mouth 2 (two) times daily. 12/26/20  Yes Clarisa Kindred A, FNP  ?torsemide (DEMADEX) 20 MG tablet Take 1 tablet (20 mg total) by mouth daily. 01/28/21  Yes Clarisa Kindred A, FNP  ?cloNIDine (CATAPRES) 0.2 MG tablet Take 0.2 mg by mouth 2 (two) times daily. ?Patient not taking: Reported on 06/22/2021    [provider]  ?ipratropium-albuterol (DUONEB) 0.5-2.5 (3) MG/3ML SOLN Take 3 mLs by nebulization every 2 (two) hours as needed. ?Patient not taking: Reported on 06/22/2021 12/16/20   Drema Dallas, MD  ?nitroGLYCERIN (NITROSTAT) 0.4 MG SL tablet Place 1 tablet (0.4 mg total) under  the tongue every 5 (five) minutes as needed for chest pain. ?Patient not taking: Reported on 06/22/2021 04/27/19   Sherren Mocha, MD  ? ? ?Review of Systems  ?Constitutional:  Negative for appetite change.  ?HENT:  Negative for congestion, postnasal drip and sore throat.   ?Eyes: Negative.   ?Respiratory:  Positive for shortness of breath. Negative for cough, chest tightness and wheezing.   ?Cardiovascular:  Positive for leg swelling (left ankle). Negative for chest pain and palpitations.  ?Gastrointestinal:  Negative for abdominal distention and abdominal pain.  ?Endocrine: Negative.   ?Genitourinary: Negative.   ?Musculoskeletal:  Negative for arthralgias, back pain, myalgias and neck pain.  ?Skin: Negative.   ?Allergic/Immunologic: Negative.   ?Neurological:  Negative for dizziness, weakness and light-headedness.  ?Hematological:  Negative for adenopathy. Does not bruise/bleed easily.  ?Psychiatric/Behavioral:  Positive for sleep disturbance (wake  frequently; sleeping on 1 pillow). Negative for dysphoric mood. The patient is not nervous/anxious.   ? ?Vitals:  ? 06/22/21 1042  ?BP: (!) 141/69  ?Pulse: 73  ?Resp: 20  ?SpO2: 97%  ?Weight: 271 lb (122.9 kg)  ?[place]

## 2021-06-22 NOTE — Patient Instructions (Addendum)
Continue weighing daily and call for an overnight weight gain of 3 pounds or more or a weekly weight gain of more than 5 pounds. ? ? ?If you have voicemail, please make sure your mailbox is cleaned out so that we may leave a message and please make sure to listen to any voicemails.  ? ? ?Increase your entresto to 2 tablets every morning and 2 tablets every evening.  ?

## 2021-06-27 ENCOUNTER — Other Ambulatory Visit: Payer: Self-pay | Admitting: Family

## 2021-07-23 ENCOUNTER — Other Ambulatory Visit
Admission: RE | Admit: 2021-07-23 | Discharge: 2021-07-23 | Disposition: A | Discharge status: Home / Self Care | Payer: Medicare Other | Source: Ambulatory Visit | Attending: Family | Admitting: Family

## 2021-07-23 ENCOUNTER — Ambulatory Visit (HOSPITAL_BASED_OUTPATIENT_CLINIC_OR_DEPARTMENT_OTHER): Payer: Medicare Other | Admitting: Family

## 2021-07-23 ENCOUNTER — Encounter: Payer: Self-pay | Admitting: Family

## 2021-07-23 VITALS — BP 125/74 | HR 70 | Resp 14 | Ht 68.0 in | Wt 276.1 lb

## 2021-07-23 DIAGNOSIS — I890 Lymphedema, not elsewhere classified: Secondary | ICD-10-CM | POA: Insufficient documentation

## 2021-07-23 DIAGNOSIS — I10 Essential (primary) hypertension: Secondary | ICD-10-CM | POA: Diagnosis not present

## 2021-07-23 DIAGNOSIS — Z833 Family history of diabetes mellitus: Secondary | ICD-10-CM | POA: Insufficient documentation

## 2021-07-23 DIAGNOSIS — E785 Hyperlipidemia, unspecified: Secondary | ICD-10-CM | POA: Insufficient documentation

## 2021-07-23 DIAGNOSIS — I2510 Atherosclerotic heart disease of native coronary artery without angina pectoris: Secondary | ICD-10-CM | POA: Insufficient documentation

## 2021-07-23 DIAGNOSIS — I25118 Atherosclerotic heart disease of native coronary artery with other forms of angina pectoris: Secondary | ICD-10-CM | POA: Diagnosis not present

## 2021-07-23 DIAGNOSIS — I5032 Chronic diastolic (congestive) heart failure: Secondary | ICD-10-CM | POA: Insufficient documentation

## 2021-07-23 DIAGNOSIS — E109 Type 1 diabetes mellitus without complications: Secondary | ICD-10-CM | POA: Insufficient documentation

## 2021-07-23 DIAGNOSIS — Z794 Long term (current) use of insulin: Secondary | ICD-10-CM | POA: Insufficient documentation

## 2021-07-23 DIAGNOSIS — I110 Hypertensive heart disease with heart failure: Secondary | ICD-10-CM | POA: Insufficient documentation

## 2021-07-23 LAB — BASIC METABOLIC PANEL
Anion gap: 8 (ref 5–15)
BUN: 15 mg/dL (ref 8–23)
CO2: 25 mmol/L (ref 22–32)
Calcium: 9 mg/dL (ref 8.9–10.3)
Chloride: 108 mmol/L (ref 98–111)
Creatinine, Ser: 1.04 mg/dL (ref 0.61–1.24)
GFR, Estimated: >60 mL/min (ref >60)
Glucose, Bld: 127 mg/dL — ABNORMAL HIGH (ref 70–99)
Potassium: 3.9 mmol/L (ref 3.5–5.1)
Sodium: 141 mmol/L (ref 135–145)

## 2021-07-23 MED ORDER — SACUBITRIL-VALSARTAN 49-51 MG PO TABS: 1.0000 | ORAL_TABLET | Freq: Two times a day (BID) | ORAL | 3 refills | Status: DC

## 2021-07-23 NOTE — Progress Notes (Signed)
[facility] - PHARMACIST COUNSELING NOTE  Guideline-Directed Medical Therapy/Evidence Based Medicine  ACE/ARB/ARNI: Sacubitril-valsartan 24-26 mg twice daily - takes 2 tablets Beta Blocker: Metoprolol succinate 100 mg daily Aldosterone Antagonist: None Diuretic: Torsemide 20 mg daily SGLT2i: None  Adherence Assessment  Do you ever forget to take your medication? [] Yes [x] No  Do you ever skip doses due to side effects? [] Yes [x] No  Do you have trouble affording your medicines? [] Yes [x] No  Are you ever unable to pick up your medication due to transportation difficulties? [] Yes [x] No  Do you ever stop taking your medications because you don't believe they are helping? [] Yes [x] No  Do you check your weight daily? [] Yes [x] No   Adherence strategy: pill box  Barriers to obtaining medications: none  Vital signs: HR 70, BP 125/74, weight (pounds) 276 lb ECHO: Date 12/12/2020, EF 55-60%, notes moderate LVH LHC:      Latest Ref Rng & Units 03/23/2021   12:14 PM 02/18/2021   11:15 AM 01/28/2021   11:30 AM  BMP  Glucose 70 - 99 mg/dL 191  199  221   BUN 8 - 23 mg/dL 15  12  11    Creatinine 0.61 - 1.24 mg/dL 1.18  1.31  0.96   Sodium 135 - 145 mmol/L 137  137  138   Potassium 3.5 - 5.1 mmol/L 3.9  4.0  3.8   Chloride 98 - 111 mmol/L 107  107  107   CO2 22 - 32 mmol/L 24  25  24    Calcium 8.9 - 10.3 mg/dL 8.9  8.8  8.6     Past Medical History:  Diagnosis Date   Acute MI, lateral wall, initial episode of care ([place]) 03/23/2014   CAD (coronary artery disease)    a. s/p Lat MI 2/16 >> s/p CABG; b. LHC 1/17: mLAD 100, oD1 85%, oLCx 100, OM1 filled by collats from RPLB2, RPDA stent 95% ISR, L-LAD patent, S-OM1 occluded, S-D1 occluded >> PCI: Promus DES to RPDA  - residual CAD not amenable to PCI (dist vessel/diabetic appearance)    Carotid stenosis    a. Carotid US 2/19 bilat ICA 1-39%   Chronic combined systolic and diastolic CHF  (congestive heart failure) ([place])    Hyperlipidemia 10/16/2014   Hypertension    Ischemic cardiomyopathy    a. Echo at [facility] 3/16:  EF 35-40%, diff HK, mild LVH, mild LAE, mild TR;  b. Echo 7/16: mild LVH, EF 50%, inf and lat HK-AK, Gr 2 DD, mod LAE, mild RVE, reduced RVSF, trivial TR, PASP 30 mmHg // c. Echo 5/17: moderate LVH, EF 45-50%, anteroseptal HK, grade 1 diastolic dysfunction, aortic sclerosis    Type 2 diabetes mellitus ([place]) 03/23/2014    ASSESSMENT 66 year old male who presents to the HF clinic for follow up of Heart Failure with reduced EF. PMH relevant to CAD, Type 1 Diabetes Mellitus, and hyperlipidemia. On GDMT of metoprolol succinate and Entresto. Weighs twice a week. Adding salt to food. Checks blood pressure every now and then with average of 130s/80-90s. Two weeks ago was experiencing blurred vision and lethargy with blood pressures subjectively systolics [id number], however has noticed overall improvement after increasing Entresto several weeks ago.   PLAN Reconciled medications Educated on low sodium diet. Advised to avoid adding salt to food, and to reduce overall salt intake.   Time spent: 15 minutes  Glean Salvo, PharmD Pharmacy Resident  07/23/2021 11:35 AM  Current Outpatient Medications:  albuterol (VENTOLIN HFA) 108 (90 Base) MCG/ACT inhaler, Inhale 1-2 puffs into the lungs every 6 (six) hours as needed for wheezing or shortness of breath., Disp: , Rfl:    aspirin EC 81 MG tablet, Take 1 tablet (81 mg total) by mouth daily., Disp: 90 tablet, Rfl: 3   clopidogrel (PLAVIX) 75 MG tablet, TAKE 1 TABLET BY MOUTH DAILY. PLEASE KEEP UPCOMING APPT IN JUNE 2022 BEFORE ANYMORE REFILLS., Disp: 90 tablet, Rfl: 3   Glycerin-Hypromellose-PEG 400 ([facility] DRY EYE RELIEF) 0.2-0.2-1 % SOLN, Place 1 drop into both eyes daily as needed (dry eyes)., Disp: , Rfl:    HUMALOG KWIKPEN 200 UNIT/ML KwikPen, Inject 206 Units into the skin as directed. (Administered via insulin pump),  Disp: , Rfl:    ibuprofen (ADVIL) 200 MG tablet, Take 200 mg by mouth every 6 (six) hours as needed for headache or mild pain., Disp: , Rfl:    ipratropium-albuterol (DUONEB) 0.5-2.5 (3) MG/3ML SOLN, Take 3 mLs by nebulization every 2 (two) hours as needed., Disp: 360 mL, Rfl: 0   isosorbide mononitrate (IMDUR) 30 MG 24 hr tablet, TAKE 1 TABLET BY MOUTH EVERY DAY, Disp: 90 tablet, Rfl: 1   metoprolol succinate (TOPROL-XL) 100 MG 24 hr tablet, TAKE 1 TABLET BY MOUTH DAILY. PLEASE KEEP UPCOMING APPT IN FEB TO RECEIVE FUTURE REFILLS. THANK YOU., Disp: 90 tablet, Rfl: 2   nitroGLYCERIN (NITROSTAT) 0.4 MG SL tablet, Place 1 tablet (0.4 mg total) under the tongue every 5 (five) minutes as needed for chest pain., Disp: 25 tablet, Rfl: 2   pantoprazole (PROTONIX) 40 MG tablet, Take 40 mg by mouth daily., Disp: , Rfl:    potassium chloride SA (KLOR-CON M) 20 MEQ tablet, Take 1 tablet (20 mEq total) by mouth daily., Disp: 90 tablet, Rfl: 3   prednisoLONE acetate (PRED FORTE) 1 % ophthalmic suspension, Place 1 drop into the left eye 4 (four) times daily., Disp: , Rfl:    rosuvastatin (CRESTOR) 40 MG tablet, TAKE 1 TABLET BY MOUTH EVERY DAY, Disp: 90 tablet, Rfl: 3   sacubitril-valsartan (ENTRESTO) 49-51 MG, Take 1 tablet by mouth 2 (two) times daily., Disp: 180 tablet, Rfl: 3   torsemide (DEMADEX) 20 MG tablet, TAKE 1 TABLET BY MOUTH EVERY DAY, Disp: 90 tablet, Rfl: 3   COUNSELING POINTS/CLINICAL PEARLS    DRUGS TO CAUTION IN HEART FAILURE  Drug or Class Mechanism  Analgesics NSAIDs COX-2 inhibitors Glucocorticoids  Sodium and water retention, increased systemic vascular resistance, decreased response to diuretics   Diabetes Medications Metformin Thiazolidinediones Rosiglitazone (Avandia) Pioglitazone (Actos) DPP4 Inhibitors Saxagliptin (Onglyza) Sitagliptin (Januvia)   Lactic acidosis Possible calcium channel blockade   Unknown  Antiarrhythmics Class I  Flecainide Disopyramide Class  III Sotalol Other Dronedarone  Negative inotrope, proarrhythmic   Proarrhythmic, beta blockade  Negative inotrope  Antihypertensives Alpha Blockers Doxazosin Calcium Channel Blockers Diltiazem Verapamil Nifedipine Central Alpha Adrenergics Moxonidine Peripheral Vasodilators Minoxidil  Increases renin and aldosterone  Negative inotrope    Possible sympathetic withdrawal  Unknown  Anti-infective Itraconazole Amphotericin B  Negative inotrope Unknown  Hematologic Anagrelide Cilostazol   Possible inhibition of PD IV Inhibition of PD III causing arrhythmias  Neurologic/Psychiatric Stimulants Anti-Seizure Drugs Carbamazepine Pregabalin Antidepressants Tricyclics Citalopram Parkinsons Bromocriptine Pergolide Pramipexole Antipsychotics Clozapine Antimigraine Ergotamine Methysergide Appetite suppressants Bipolar Lithium  Peripheral alpha and beta agonist activity  Negative inotrope and chronotrope Calcium channel blockade  Negative inotrope, proarrhythmic Dose-dependent QT prolongation  Excessive serotonin activity/valvular damage Excessive serotonin activity/valvular damage Unknown  IgE mediated hypersensitivy, calcium  channel blockade  Excessive serotonin activity/valvular damage Excessive serotonin activity/valvular damage Valvular damage  Direct myofibrillar degeneration, adrenergic stimulation  Antimalarials Chloroquine Hydroxychloroquine Intracellular inhibition of lysosomal enzymes  Urologic Agents Alpha Blockers Doxazosin Prazosin Tamsulosin Terazosin  Increased renin and aldosterone  Adapted from Page Carleene Overlie, et al. "Drugs That May Cause or Exacerbate Heart Failure: A Scientific Statement from the American Heart  Association." Circulation 2016; 134:e32-e69. DOI: 10.1161/CIR.0000000000000426   MEDICATION ADHERENCES TIPS AND STRATEGIES Taking medication as prescribed improves patient outcomes in heart failure (reduces  hospitalizations, improves symptoms, increases survival) Side effects of medications can be managed by decreasing doses, switching agents, stopping drugs, or adding additional therapy. Please let someone in the [facility] know if you have having bothersome side effects so we can modify your regimen. Do not alter your medication regimen without talking to [place].  Medication reminders can help patients remember to take drugs on time. If you are missing or forgetting doses you can try linking behaviors, using pill boxes, or an electronic reminder like an alarm on your phone or an app. Some people can also get automated phone calls as medication reminders.

## 2021-07-23 NOTE — Patient Instructions (Signed)
Continue weighing daily and call for an overnight weight gain of 3 pounds or more or a weekly weight gain of more than 5 pounds  If you have voicemail, please make sure your mailbox is cleaned out so that we may leave a message and please make sure to listen to any voicemails.    If you receive a satisfaction survey regarding the Heart Failure Clinic, please take the time to fill it out. This way we can continue to provide excellent care and make any changes that need to be made.     

## 2021-07-23 NOTE — Progress Notes (Signed)
Patient ID: Danny [name], male    DOB: [birth year], 66 y.o.   MRN: [id number]  HPI  Danny [name] is a 66 y/o male with a history of CAD, DM, hyperlipidemia, HTN, carotid stenosis and chronic heart failure.   Echo report from 12/13/20 reviewed and showed an EF of 55-60% along with moderate LVH and trivial Danny.   LHC done 12/12/20 and showed: Severe two-vessel coronary artery disease with chronic total occlusions of the mid LAD and ostial LCx, as noted on prior catheterizations. Mild, nonobstructive RCA disease with patent PDA stent. Widely patent LIMA-LAD bypass graft.  SVG-D1 and SVG-OM1 are known to be occluded and were not engaged on today's study. Mildly elevated left ventricular filling pressure (LVEDP 15-20 mmHg) with grossly normal left ventricular contraction.  Hasn't been admitted or been in the ED in the last 6 months.   He presents today for a follow-up visit with a chief complaint of minimal fatigue upon moderate exertion. Describes this as chronic in nature having been present for several years. He has associated pedal edema in his left lower leg, difficulty sleeping and slight weight gain along with this. He denies any dizziness, abdominal distention, palpitations, chest pain, wheezing, shortness of breath or cough.   Past Medical History:  Diagnosis Date   Acute MI, lateral wall, initial episode of care ([place]) 03/23/2014   CAD (coronary artery disease)    a. s/p Lat MI 2/16 >> s/p CABG; b. LHC 1/17: mLAD 100, oD1 85%, oLCx 100, OM1 filled by collats from RPLB2, RPDA stent 95% ISR, L-LAD patent, S-OM1 occluded, S-D1 occluded >> PCI: Promus DES to RPDA  - residual CAD not amenable to PCI (dist vessel/diabetic appearance)    Carotid stenosis    a. Carotid US 2/19 bilat ICA 1-39%   Chronic combined systolic and diastolic CHF (congestive heart failure) ([place])    Hyperlipidemia 10/16/2014   Hypertension    Ischemic cardiomyopathy    a. Echo at [facility] 3/16:  EF 35-40%, diff  HK, mild LVH, mild LAE, mild TR;  b. Echo 7/16: mild LVH, EF 50%, inf and lat HK-AK, Gr 2 DD, mod LAE, mild RVE, reduced RVSF, trivial TR, PASP 30 mmHg // c. Echo 5/17: moderate LVH, EF 45-50%, anteroseptal HK, grade 1 diastolic dysfunction, aortic sclerosis    Type 2 diabetes mellitus ([place]) 03/23/2014   Past Surgical History:  Procedure Laterality Date   CARDIAC CATHETERIZATION N/A 02/24/2015   Procedure: Left Heart Cath and Cors/Grafts Angiography;  Surgeon: Sherren Mocha, MD;  Location: [facility] [facility];  Service: Cardiovascular;  Laterality: N/A;   CARDIAC CATHETERIZATION N/A 02/24/2015   Procedure: Coronary Stent Intervention;  Surgeon: Sherren Mocha, MD;  Location: [place] [facility];  Service: Cardiovascular;  Laterality: N/A;   CORONARY ARTERY BYPASS GRAFT N/A 03/28/2014   Procedure: CORONARY ARTERY BYPASS GRAFTING (CABG);  Surgeon: Melrose Nakayama, MD;  Location: [facility];  Service: Open Heart Surgery;  Laterality: N/A;   LEFT HEART CATH AND CORS/GRAFTS ANGIOGRAPHY N/A 05/07/2019   Procedure: LEFT HEART CATH AND CORS/GRAFTS ANGIOGRAPHY;  Surgeon: Sherren Mocha, MD;  Location: [facility];  Service: Cardiovascular;  Laterality: N/A;   LEFT HEART CATH AND CORS/GRAFTS ANGIOGRAPHY N/A 12/12/2020   Procedure: LEFT HEART CATH AND CORS/GRAFTS ANGIOGRAPHY;  Surgeon: Nelva Bush, MD;  Location: [facility] [place] [facility];  Service: Cardiovascular;  Laterality: N/A;   LEFT HEART CATHETERIZATION WITH CORONARY ANGIOGRAM N/A 03/23/2014   Procedure: LEFT HEART CATHETERIZATION WITH CORONARY ANGIOGRAM;  Surgeon:  Micheline Chapman, MD;  Location: [facility] CATH LAB;  Service: Cardiovascular;  Laterality: N/A;   PERCUTANEOUS CORONARY STENT INTERVENTION (PCI-S)  02/24/2015   PDA   TEE WITHOUT CARDIOVERSION N/A 03/28/2014   Procedure: TRANSESOPHAGEAL ECHOCARDIOGRAM (TEE);  Surgeon: Loreli Slot, MD;  [facility];  Service: Open Heart Surgery;  Laterality: N/A;   Family History  Problem Relation  Age of Onset   Healthy Mother        No history of CAD   Healthy Father        No history of CAD   Heart attack Father    Diabetes Brother    Stroke Neg Hx    Social History   Tobacco Use   Smoking status: Never   Smokeless tobacco: Never  Substance Use Topics   Alcohol use: Yes   Allergies  Allergen Reactions   Spironolactone Other (See Comments)    Other reaction(s): Unknown Increased creatinine Increased creatinine   Prior to Admission medications   Medication Sig Start Date End Date Taking? Authorizing Provider  albuterol (VENTOLIN HFA) 108 (90 Base) MCG/ACT inhaler Inhale 1-2 puffs into the lungs every 6 (six) hours as needed for wheezing or shortness of breath.   Yes [provider]  clopidogrel (PLAVIX) 75 MG tablet TAKE 1 TABLET BY MOUTH DAILY. PLEASE KEEP UPCOMING APPT IN JUNE 2022 BEFORE ANYMORE REFILLS. 08/25/20  Yes Tonny Bollman, MD  HUMALOG KWIKPEN 200 UNIT/ML KwikPen Inject 206 Units into the skin as directed. (Administered via insulin pump)   Yes [provider]  ibuprofen (ADVIL) 200 MG tablet Take 200 mg by mouth every 6 (six) hours as needed for headache or mild pain.   Yes [provider]  ipratropium-albuterol (DUONEB) 0.5-2.5 (3) MG/3ML SOLN Take 3 mLs by nebulization every 2 (two) hours as needed. 12/16/20  Yes Drema Dallas, MD  isosorbide mononitrate (IMDUR) 30 MG 24 hr tablet TAKE 1 TABLET BY MOUTH EVERY DAY 03/23/21  Yes Alver Sorrow, NP  metoprolol succinate (TOPROL-XL) 100 MG 24 hr tablet TAKE 1 TABLET BY MOUTH DAILY. PLEASE KEEP UPCOMING APPT IN FEB TO RECEIVE FUTURE REFILLS. THANK YOU. 06/02/21  Yes Tonny Bollman, MD  nitroGLYCERIN (NITROSTAT) 0.4 MG SL tablet Place 1 tablet (0.4 mg total) under the tongue every 5 (five) minutes as needed for chest pain. 04/27/19  Yes Tonny Bollman, MD  pantoprazole (PROTONIX) 40 MG tablet Take 40 mg by mouth daily. 10/18/20  Yes [provider]  potassium chloride SA (KLOR-CON  M) 20 MEQ tablet Take 1 tablet (20 mEq total) by mouth daily. 01/28/21  Yes Zully Frane, Inetta Fermo A, FNP  prednisoLONE acetate (PRED FORTE) 1 % ophthalmic suspension Place 1 drop into the left eye 4 (four) times daily. 05/06/21  Yes [provider]  rosuvastatin (CRESTOR) 40 MG tablet TAKE 1 TABLET BY MOUTH EVERY DAY 04/29/21  Yes Weaver, Scott T, PA-C  sacubitril-valsartan (ENTRESTO) 24-26 MG Take 1 tablet by mouth 2 (two) times daily. Patient taking differently: Take 2 tablets by mouth 2 (two) times daily. 12/26/20  Yes Clarisa Kindred A, FNP  torsemide (DEMADEX) 20 MG tablet TAKE 1 TABLET BY MOUTH EVERY DAY 06/28/21  Yes Clarisa Kindred A, FNP  aspirin EC 81 MG tablet Take 1 tablet (81 mg total) by mouth daily. 04/30/19   Tonny Bollman, MD  Glycerin-Hypromellose-PEG 400 ([facility] DRY EYE RELIEF) 0.2-0.2-1 % SOLN Place 1 drop into both eyes daily as needed (dry eyes).    [provider]  Review of Systems  Constitutional:  Positive for fatigue. Negative for appetite change.  HENT:  Negative for congestion, postnasal drip and sore throat.   Eyes: Negative.   Respiratory:  Negative for cough, chest tightness, shortness of breath and wheezing.   Cardiovascular:  Positive for leg swelling (left ankle). Negative for chest pain and palpitations.  Gastrointestinal:  Negative for abdominal distention and abdominal pain.  Endocrine: Negative.   Genitourinary: Negative.   Musculoskeletal:  Negative for arthralgias, back pain, myalgias and neck pain.  Skin: Negative.   Allergic/Immunologic: Negative.   Neurological:  Negative for dizziness, weakness and light-headedness.  Hematological:  Negative for adenopathy. Does not bruise/bleed easily.  Psychiatric/Behavioral:  Positive for sleep disturbance (wake frequently; sleeping on 1 pillow). Negative for dysphoric mood. The patient is not nervous/anxious.    Vitals:   07/23/21 1026  BP: 125/74  Pulse: 70  Resp: 14  SpO2: 96%  Weight: 276 lb 2 oz  (125.2 kg)  Height: 5\' 8"  (1.727 m)   Wt Readings from Last 3 Encounters:  07/23/21 276 lb 2 oz (125.2 kg)  06/22/21 271 lb (122.9 kg)  04/13/21 283 lb (128.4 kg)   Lab Results  Component Value Date   CREATININE 1.18 03/23/2021   CREATININE 1.31 (H) 02/18/2021   CREATININE 0.96 01/28/2021   Physical Exam Vitals and nursing note reviewed.  Constitutional:      Appearance: Normal appearance.  HENT:     Head: Normocephalic and atraumatic.  Cardiovascular:     Rate and Rhythm: Normal rate and regular rhythm.  Pulmonary:     Effort: Pulmonary effort is normal. No respiratory distress.     Breath sounds: No wheezing or rales.  Abdominal:     General: There is no distension.     Palpations: Abdomen is soft.  Musculoskeletal:        General: No tenderness.     Cervical back: Normal range of motion and neck supple.     Right lower leg: Edema (trace pitting) present.     Left lower leg: Edema (trace pitting) present.  Skin:    General: Skin is warm and dry.  Neurological:     General: No focal deficit present.     Mental Status: He is alert and oriented to person, place, and time.  Psychiatric:        Mood and Affect: Mood normal.        Behavior: Behavior normal.        Thought Content: Thought content normal.    Assessment & Plan:  1: Chronic heart failure with preserved ejection fraction with structural changes (LVH)- - NYHA class II - euvolemic today - weighing daily; reminded to call for an overnight weight gain of > 2 pounds or a weekly weight gain of > 5 pounds - weight up 5 pounds from last visit here 1 month ago - does add salt to eggs but says "that's about it" - on GDMT of entresto; consider titrating if BP remains good - will check labs today since entresto increase at last visit - Type 1 diabetic so not a candidate for jardiance - BNP 12/12/20 was 2230  - PharmD reconciled medications with the patient  2: HTN- - BP looks good (125/74) - saw PCP  (Feldpausch) 07/03/21 - BMP 07/03/21 reviewed and showed sodium 139, potassium 4.1, creatinine 1.1 & GFR 67  3: Type 1 DM- - saw endocrinology Honor Junes) 02/23/21 - A1c 07/03/21 was 8.1%  4: CAD- - saw cardiology Burt Knack)  04/13/21  5: Lymphedema- - stage 2 - stable   Medication bottles reviewed.   Return in 3 months, sooner if needed.

## 2021-07-31 ENCOUNTER — Other Ambulatory Visit: Payer: Self-pay | Admitting: Cardiovascular Disease

## 2021-08-09 ENCOUNTER — Other Ambulatory Visit: Payer: Self-pay | Admitting: Family

## 2021-08-09 DIAGNOSIS — I5042 Chronic combined systolic (congestive) and diastolic (congestive) heart failure: Secondary | ICD-10-CM

## 2021-08-09 DIAGNOSIS — I25118 Atherosclerotic heart disease of native coronary artery with other forms of angina pectoris: Secondary | ICD-10-CM

## 2021-08-21 ENCOUNTER — Encounter: Payer: Self-pay | Admitting: Cardiovascular Disease

## 2021-08-24 ENCOUNTER — Telehealth (HOSPITAL_COMMUNITY): Payer: Self-pay | Admitting: Licensed Clinical Social Worker

## 2021-08-24 ENCOUNTER — Telehealth: Payer: Self-pay | Admitting: Family

## 2021-08-24 NOTE — Telephone Encounter (Signed)
CSW referred to assist patient with obtaining a BP cuff. CSW ordered and cuff will be delivered to home.  Lasandra Beech, LCSW, CCSW-MCS [phone]

## 2021-08-24 NOTE — Telephone Encounter (Signed)
Patient Called requesting a new blood pressure cuff after the one he had broke. After speaking with social work, they stated they can help out. I called and LVM with patient letting him know we will be shipping one to his house.   Danny [name], NT

## 2021-10-22 ENCOUNTER — Ambulatory Visit: Payer: Medicare Other | Attending: Family | Admitting: Family

## 2021-10-22 ENCOUNTER — Encounter: Payer: Self-pay | Admitting: Family

## 2021-10-22 VITALS — BP 142/94 | HR 71 | Resp 20 | Ht 68.0 in | Wt 275.0 lb

## 2021-10-22 DIAGNOSIS — I2510 Atherosclerotic heart disease of native coronary artery without angina pectoris: Secondary | ICD-10-CM | POA: Insufficient documentation

## 2021-10-22 DIAGNOSIS — E785 Hyperlipidemia, unspecified: Secondary | ICD-10-CM | POA: Insufficient documentation

## 2021-10-22 DIAGNOSIS — I25118 Atherosclerotic heart disease of native coronary artery with other forms of angina pectoris: Secondary | ICD-10-CM | POA: Diagnosis not present

## 2021-10-22 DIAGNOSIS — E109 Type 1 diabetes mellitus without complications: Secondary | ICD-10-CM | POA: Insufficient documentation

## 2021-10-22 DIAGNOSIS — I10 Essential (primary) hypertension: Secondary | ICD-10-CM

## 2021-10-22 DIAGNOSIS — Z794 Long term (current) use of insulin: Secondary | ICD-10-CM | POA: Diagnosis not present

## 2021-10-22 DIAGNOSIS — I110 Hypertensive heart disease with heart failure: Secondary | ICD-10-CM | POA: Diagnosis not present

## 2021-10-22 DIAGNOSIS — I5032 Chronic diastolic (congestive) heart failure: Secondary | ICD-10-CM

## 2021-10-22 DIAGNOSIS — I890 Lymphedema, not elsewhere classified: Secondary | ICD-10-CM | POA: Insufficient documentation

## 2021-10-22 NOTE — Progress Notes (Signed)
Patient ID: Satya Heady, male    DOB: [birth year], 66 y.o.   MRN: [id number]  HPI  Mr Sparta is a 66 y/o male with a history of CAD, DM, hyperlipidemia, HTN, carotid stenosis and chronic heart failure.   Echo report from 12/13/20 reviewed and showed an EF of 55-60% along with moderate LVH and trivial MR.   LHC done 12/12/20 and showed: Severe two-vessel coronary artery disease with chronic total occlusions of the mid LAD and ostial LCx, as noted on prior catheterizations. Mild, nonobstructive RCA disease with patent PDA stent. Widely patent LIMA-LAD bypass graft.  SVG-D1 and SVG-OM1 are known to be occluded and were not engaged on today's study. Mildly elevated left ventricular filling pressure (LVEDP 15-20 mmHg) with grossly normal left ventricular contraction.  Hasn't been admitted or been in the ED in the last 6 months.   He presents today for a follow-up visit with a chief complaint of minimal fatigue with moderate exertion. Describes this as chronic in nature. He has associated pedal edema and chronic difficulty sleeping along with this. He denies any dizziness, abdominal distention, palpitations, chest pain, shortness of breath, cough, wheezing or weight gain.   Past Medical History:  Diagnosis Date   Acute MI, lateral wall, initial episode of care ([place]) 03/23/2014   CAD (coronary artery disease)    a. s/p Lat MI 2/16 >> s/p CABG; b. LHC 1/17: mLAD 100, oD1 85%, oLCx 100, OM1 filled by collats from RPLB2, RPDA stent 95% ISR, L-LAD patent, S-OM1 occluded, S-D1 occluded >> PCI: Promus DES to RPDA  - residual CAD not amenable to PCI (dist vessel/diabetic appearance)    Carotid stenosis    a. Carotid US 2/19 bilat ICA 1-39%   Chronic combined systolic and diastolic CHF (congestive heart failure) ([place])    Hyperlipidemia 10/16/2014   Hypertension    Ischemic cardiomyopathy    a. Echo at [facility] 3/16:  EF 35-40%, diff HK, mild LVH, mild LAE, mild TR;  b. Echo 7/16: mild LVH, EF  50%, inf and lat HK-AK, Gr 2 DD, mod LAE, mild RVE, reduced RVSF, trivial TR, PASP 30 mmHg // c. Echo 5/17: moderate LVH, EF 45-50%, anteroseptal HK, grade 1 diastolic dysfunction, aortic sclerosis    Type 2 diabetes mellitus ([place]) 03/23/2014   Past Surgical History:  Procedure Laterality Date   CARDIAC CATHETERIZATION N/A 02/24/2015   Procedure: Left Heart Cath and Cors/Grafts Angiography;  Surgeon: Sherren Mocha, MD;  Location: [place] [facility];  Service: Cardiovascular;  Laterality: N/A;   CARDIAC CATHETERIZATION N/A 02/24/2015   Procedure: Coronary Stent Intervention;  Surgeon: Sherren Mocha, MD;  Location: [place] [facility];  Service: Cardiovascular;  Laterality: N/A;   CORONARY ARTERY BYPASS GRAFT N/A 03/28/2014   Procedure: CORONARY ARTERY BYPASS GRAFTING (CABG);  Surgeon: Melrose Nakayama, MD;  Location: [place];  Service: Open Heart Surgery;  Laterality: N/A;   LEFT HEART CATH AND CORS/GRAFTS ANGIOGRAPHY N/A 05/07/2019   Procedure: LEFT HEART CATH AND CORS/GRAFTS ANGIOGRAPHY;  Surgeon: Sherren Mocha, MD;  Location: [place] [facility];  Service: Cardiovascular;  Laterality: N/A;   LEFT HEART CATH AND CORS/GRAFTS ANGIOGRAPHY N/A 12/12/2020   Procedure: LEFT HEART CATH AND CORS/GRAFTS ANGIOGRAPHY;  Surgeon: Nelva Bush, MD;  Location: [place] [facility];  Service: Cardiovascular;  Laterality: N/A;   LEFT HEART CATHETERIZATION WITH CORONARY ANGIOGRAM N/A 03/23/2014   Procedure: LEFT HEART CATHETERIZATION WITH CORONARY ANGIOGRAM;  Surgeon: Blane Ohara, MD;  Location: [facility] CATH LAB;  Service:  Cardiovascular;  Laterality: N/A;   PERCUTANEOUS CORONARY STENT INTERVENTION (PCI-S)  02/24/2015   PDA   TEE WITHOUT CARDIOVERSION N/A 03/28/2014   Procedure: TRANSESOPHAGEAL ECHOCARDIOGRAM (TEE);  Surgeon: Loreli Slot, MD;  Location: [facility] OR;  Service: Open Heart Surgery;  Laterality: N/A;   Family History  Problem Relation Age of Onset   Healthy Mother        No history of CAD    Healthy Father        No history of CAD   Heart attack Father    Diabetes Brother    Stroke Neg Hx    Social History   Tobacco Use   Smoking status: Never   Smokeless tobacco: Never  Substance Use Topics   Alcohol use: Yes   Allergies  Allergen Reactions   Spironolactone Other (See Comments)    Other reaction(s): Unknown Increased creatinine Increased creatinine   Prior to Admission medications   Medication Sig Start Date End Date Taking? Authorizing Provider  albuterol (VENTOLIN HFA) 108 (90 Base) MCG/ACT inhaler Inhale 1-2 puffs into the lungs every 6 (six) hours as needed for wheezing or shortness of breath.   Yes [provider]  aspirin EC 81 MG tablet Take 1 tablet (81 mg total) by mouth daily. 04/30/19  Yes Tonny Bollman, MD  Cholecalciferol (VITAMIN D3) 25 MCG (1000 UT) CHEW Chew 25 mcg by mouth daily.   Yes [provider]  clopidogrel (PLAVIX) 75 MG tablet TAKE 1 TABLET BY MOUTH DAILY. PLEASE KEEP UPCOMING APPT IN JUNE 2022 BEFORE ANYMORE REFILLS. 07/31/21  Yes Tonny Bollman, MD  Glycerin-Hypromellose-PEG 400 ([employer] DRY EYE RELIEF) 0.2-0.2-1 % SOLN Place 1 drop into both eyes daily as needed (dry eyes).   Yes [provider]  HUMALOG KWIKPEN 200 UNIT/ML KwikPen Inject 206 Units into the skin as directed. (Administered via insulin pump)   Yes [provider]  ibuprofen (ADVIL) 200 MG tablet Take 200 mg by mouth every 6 (six) hours as needed for headache or mild pain.   Yes [provider]  ipratropium-albuterol (DUONEB) 0.5-2.5 (3) MG/3ML SOLN Take 3 mLs by nebulization every 2 (two) hours as needed. 12/16/20  Yes Drema Dallas, MD  isosorbide mononitrate (IMDUR) 30 MG 24 hr tablet TAKE 1 TABLET BY MOUTH EVERY DAY 08/11/21  Yes Tonny Bollman, MD  metoprolol succinate (TOPROL-XL) 100 MG 24 hr tablet TAKE 1 TABLET BY MOUTH DAILY. PLEASE KEEP UPCOMING APPT IN FEB TO RECEIVE FUTURE REFILLS. THANK YOU. 06/02/21  Yes Tonny Bollman, MD   nitroGLYCERIN (NITROSTAT) 0.4 MG SL tablet Place 1 tablet (0.4 mg total) under the tongue every 5 (five) minutes as needed for chest pain. 04/27/19  Yes Tonny Bollman, MD  pantoprazole (PROTONIX) 40 MG tablet Take 40 mg by mouth daily. 10/18/20  Yes [provider]  potassium chloride SA (KLOR-CON M) 20 MEQ tablet Take 1 tablet (20 mEq total) by mouth daily. 01/28/21  Yes Litsy Epting, Inetta Fermo A, FNP  prednisoLONE acetate (PRED FORTE) 1 % ophthalmic suspension Place 1 drop into the left eye 4 (four) times daily. 05/06/21  Yes [provider]  rosuvastatin (CRESTOR) 40 MG tablet TAKE 1 TABLET BY MOUTH EVERY DAY 04/29/21  Yes Weaver, Scott T, PA-C  sacubitril-valsartan (ENTRESTO) 49-51 MG Take 1 tablet by mouth 2 (two) times daily. 07/23/21  Yes Clarisa Kindred A, FNP  torsemide (DEMADEX) 20 MG tablet TAKE 1 TABLET BY MOUTH EVERY DAY 06/28/21  Yes Delma Freeze, FNP    Review of  Systems  Constitutional:  Positive for fatigue. Negative for appetite change.  HENT:  Negative for congestion, postnasal drip and sore throat.   Eyes: Negative.   Respiratory:  Negative for cough, chest tightness, shortness of breath and wheezing.   Cardiovascular:  Positive for leg swelling (left ankle). Negative for chest pain and palpitations.  Gastrointestinal:  Negative for abdominal distention and abdominal pain.  Endocrine: Negative.   Genitourinary: Negative.   Musculoskeletal:  Negative for arthralgias, back pain, myalgias and neck pain.  Skin: Negative.   Allergic/Immunologic: Negative.   Neurological:  Negative for dizziness, weakness and light-headedness.  Hematological:  Negative for adenopathy. Does not bruise/bleed easily.  Psychiatric/Behavioral:  Positive for sleep disturbance (wake frequently; sleeping on 1 pillow). Negative for dysphoric mood. The patient is not nervous/anxious.    Vitals:   10/22/21 1335  BP: (!) 142/94  Pulse: 71  Resp: 20  SpO2: 96%  Weight: 275 lb (124.7 kg)  Height:  5\' 8"  (1.727 m)   Wt Readings from Last 3 Encounters:  10/22/21 275 lb (124.7 kg)  07/23/21 276 lb 2 oz (125.2 kg)  06/22/21 271 lb (122.9 kg)   Lab Results  Component Value Date   CREATININE 1.04 07/23/2021   CREATININE 1.18 03/23/2021   CREATININE 1.31 (H) 02/18/2021   Physical Exam Vitals and nursing note reviewed.  Constitutional:      Appearance: Normal appearance.  HENT:     Head: Normocephalic and atraumatic.  Cardiovascular:     Rate and Rhythm: Normal rate and regular rhythm.  Pulmonary:     Effort: Pulmonary effort is normal. No respiratory distress.     Breath sounds: No wheezing or rales.  Abdominal:     General: There is no distension.     Palpations: Abdomen is soft.  Musculoskeletal:        General: No tenderness.     Cervical back: Normal range of motion and neck supple.     Right lower leg: Edema (trace pitting) present.     Left lower leg: Edema (trace pitting) present.  Skin:    General: Skin is warm and dry.  Neurological:     General: No focal deficit present.     Mental Status: He is alert and oriented to person, place, and time.  Psychiatric:        Mood and Affect: Mood normal.        Behavior: Behavior normal.        Thought Content: Thought content normal.   Assessment & Plan:  1: Chronic heart failure with preserved ejection fraction with structural changes (LVH)- - NYHA class II - euvolemic today - weighing daily; reminded to call for an overnight weight gain of > 2 pounds or a weekly weight gain of > 5 pounds - weight down 1 pound from last visit here 3 months ago - does add salt to eggs but says "that's about it" - on GDMT of entresto - Type 1 diabetic so not a candidate for jardiance - spironolactone caused creatinine to rise - BNP 12/12/20 was 2230   2: HTN- - BP mildly elevated (142/94) - saw PCP 12/14/20) 09/11/21 - BMP 07/23/21 reviewed and showed sodium 141, potassium 3.9, creatinine 1.04 & GFR >60  3: Type 1 DM- - saw  endocrinology 09/22/21) 08/24/21 - A1c 07/03/21 was 8.1% - home glucose this morning was 160  4: CAD- - saw cardiology 07/05/21) 04/13/21  5: Lymphedema- - stage 2 - stable - has compression socks at home  and normally wears them but didn't wear them today   Medication bottles reviewed.   Return in 6 months, sooner if needed.

## 2021-10-22 NOTE — Patient Instructions (Signed)
Continue weighing daily and call for an overnight weight gain of 3 pounds or more or a weekly weight gain of more than 5 pounds  If you have voicemail, please make sure your mailbox is cleaned out so that we may leave a message and please make sure to listen to any voicemails.    If you receive a satisfaction survey regarding the Heart Failure Clinic, please take the time to fill it out. This way we can continue to provide excellent care and make any changes that need to be made.     

## 2021-12-23 ENCOUNTER — Other Ambulatory Visit: Payer: Self-pay | Admitting: Family

## 2021-12-23 ENCOUNTER — Other Ambulatory Visit: Payer: Self-pay | Admitting: Cardiovascular Disease

## 2021-12-28 ENCOUNTER — Other Ambulatory Visit: Payer: Self-pay | Admitting: Physician Assistant

## 2022-01-20 ENCOUNTER — Other Ambulatory Visit: Payer: Self-pay | Admitting: Physician Assistant

## 2022-01-21 ENCOUNTER — Encounter: Payer: Self-pay | Admitting: Cardiovascular Disease

## 2022-01-21 NOTE — Telephone Encounter (Signed)
Spoke with patient to clarify which medication he was going to run out of. He states he has enough Crestor to last a few weeks but the bottle states he has to be seen before any additional refills can be given.   Patient is scheduled to be seen in clinic 02/12/22. No further needs this at this time.

## 2022-01-28 ENCOUNTER — Other Ambulatory Visit: Payer: Self-pay | Admitting: Physician Assistant

## 2022-02-12 ENCOUNTER — Encounter: Payer: Self-pay | Admitting: Cardiovascular Disease

## 2022-02-12 ENCOUNTER — Ambulatory Visit: Payer: Medicare Other | Attending: Cardiovascular Disease | Admitting: Cardiovascular Disease

## 2022-02-12 VITALS — BP 132/78 | HR 84 | Ht 68.0 in | Wt 279.0 lb

## 2022-02-12 DIAGNOSIS — E782 Mixed hyperlipidemia: Secondary | ICD-10-CM

## 2022-02-12 DIAGNOSIS — I10 Essential (primary) hypertension: Secondary | ICD-10-CM | POA: Diagnosis not present

## 2022-02-12 DIAGNOSIS — I25118 Atherosclerotic heart disease of native coronary artery with other forms of angina pectoris: Secondary | ICD-10-CM | POA: Diagnosis not present

## 2022-02-12 DIAGNOSIS — I5032 Chronic diastolic (congestive) heart failure: Secondary | ICD-10-CM

## 2022-02-12 NOTE — Patient Instructions (Signed)
Medication Instructions:  Your physician recommends that you continue on your current medications as directed. Please refer to the Current Medication list given to you today.  *If you need a refill on your cardiac medications before your next appointment, please call your pharmacy*  Lab Work: If you have labs (blood work) drawn today and your tests are completely normal, you will receive your results only by: MyChart Message (if you have MyChart) OR A paper copy in the mail If you have any lab test that is abnormal or we need to change your treatment, we will call you to review the results.  Testing/Procedures: None ordered today.  Follow-Up: At [facility], you and your health needs are our priority.  As part of our continuing mission to provide you with exceptional heart care, we have created designated Provider Care Teams.  These Care Teams include your primary Cardiologist (physician) and Advanced Practice Providers (APPs -  Physician Assistants and Nurse Practitioners) who all work together to provide you with the care you need, when you need it.  We recommend signing up for the patient portal called "MyChart".  Sign up information is provided on this After Visit Summary.  MyChart is used to connect with patients for Virtual Visits (Telemedicine).  Patients are able to view lab/test results, encounter notes, upcoming appointments, etc.  Non-urgent messages can be sent to your provider as well.   To learn more about what you can do with MyChart, go to [email or web address].    Your next appointment:   1 year(s)  The format for your next appointment:   In Person  Provider:   Michael Cooper, MD     Important Information About Sugar       

## 2022-02-12 NOTE — Progress Notes (Signed)
Cardiology Office Note:    Date:  02/12/2022   ID:  Danny, [name] 07-30-1955, MRN [id number]  PCP:  Luciana Axe, NP   [facility] Providers Cardiologist:  Tonny Bollman, MD     Referring MD: Luciana Axe, NP   Chief Complaint  Patient presents with   Coronary Artery Disease    History of Present Illness:    Danny [name] is a 66 y.o. male with a hx of HFrEF, ICM, DM2, HTN, HLD, carotid artery disease, CAD s/p CABG in 2016 with cath 02/2015 showing S-OM and S-Dx occluded, patency of the LIMA to LAD, and DES to RPDA presents today for follow-up evaluation.  Cardiac catheterization in 2022 showed stable findings with no significant change in coronary anatomy.   The patient is here alone today.  He reports no recent change in any cardiac symptoms.  He has developed a cough and he has seen his primary care doctor this afternoon to be evaluated.  He denies fevers, chills, or bodyaches.  He has no orthopnea or PND.  He complains of shortness of breath with bending over.  No chest pain or pressure.  His previous anginal equivalent was chest burning and he has had no recent symptoms.  Past Medical History:  Diagnosis Date   Acute MI, lateral wall, initial episode of care (HCC) 03/23/2014   CAD (coronary artery disease)    a. s/p Lat MI 2/16 >> s/p CABG; b. LHC 1/17: mLAD 100, oD1 85%, oLCx 100, OM1 filled by collats from RPLB2, RPDA stent 95% ISR, L-LAD patent, S-OM1 occluded, S-D1 occluded >> PCI: Promus DES to RPDA  - residual CAD not amenable to PCI (dist vessel/diabetic appearance)    Carotid stenosis    a. Carotid US 2/19 bilat ICA 1-39%   Chronic combined systolic and diastolic CHF (congestive heart failure) (HCC)    Hyperlipidemia 10/16/2014   Hypertension    Ischemic cardiomyopathy    a. Echo at [facility] 3/16:  EF 35-40%, diff HK, mild LVH, mild LAE, mild TR;  b. Echo 7/16: mild LVH, EF 50%, inf and lat HK-AK, Gr 2 DD, mod LAE, mild RVE, reduced  RVSF, trivial TR, PASP 30 mmHg // c. Echo 5/17: moderate LVH, EF 45-50%, anteroseptal HK, grade 1 diastolic dysfunction, aortic sclerosis    Type 2 diabetes mellitus (HCC) 03/23/2014    Past Surgical History:  Procedure Laterality Date   CARDIAC CATHETERIZATION N/A 02/24/2015   Procedure: Left Heart Cath and Cors/Grafts Angiography;  Surgeon: Tonny Bollman, MD;  Location: [facility] INVASIVE CV LAB;  Service: Cardiovascular;  Laterality: N/A;   CARDIAC CATHETERIZATION N/A 02/24/2015   Procedure: Coronary Stent Intervention;  Surgeon: Tonny Bollman, MD;  Location: [facility] INVASIVE CV LAB;  Service: Cardiovascular;  Laterality: N/A;   CORONARY ARTERY BYPASS GRAFT N/A 03/28/2014   Procedure: CORONARY ARTERY BYPASS GRAFTING (CABG);  Surgeon: Loreli Slot, MD;  Location: [facility] OR;  Service: Open Heart Surgery;  Laterality: N/A;   LEFT HEART CATH AND CORS/GRAFTS ANGIOGRAPHY N/A 05/07/2019   Procedure: LEFT HEART CATH AND CORS/GRAFTS ANGIOGRAPHY;  Surgeon: Tonny Bollman, MD;  Location: [facility] INVASIVE CV LAB;  Service: Cardiovascular;  Laterality: N/A;   LEFT HEART CATH AND CORS/GRAFTS ANGIOGRAPHY N/A 12/12/2020   Procedure: LEFT HEART CATH AND CORS/GRAFTS ANGIOGRAPHY;  Surgeon: Yvonne Kendall, MD;  Location: [facility] INVASIVE CV LAB;  Service: Cardiovascular;  Laterality: N/A;   LEFT HEART CATHETERIZATION WITH CORONARY ANGIOGRAM N/A 03/23/2014   Procedure: LEFT HEART CATHETERIZATION WITH CORONARY  Rosalin Hawking;  Surgeon: Micheline Chapman, MD;  Location: [facility] CATH LAB;  Service: Cardiovascular;  Laterality: N/A;   PERCUTANEOUS CORONARY STENT INTERVENTION (PCI-S)  02/24/2015   PDA   TEE WITHOUT CARDIOVERSION N/A 03/28/2014   Procedure: TRANSESOPHAGEAL ECHOCARDIOGRAM (TEE);  Surgeon: Loreli Slot, MD;  Location: [facility] OR;  Service: Open Heart Surgery;  Laterality: N/A;    Current Medications: Current Meds  Medication Sig   albuterol (VENTOLIN HFA) 108 (90 Base) MCG/ACT inhaler Inhale 1-2 puffs into the lungs every 6  (six) hours as needed for wheezing or shortness of breath.   aspirin EC 81 MG tablet Take 1 tablet (81 mg total) by mouth daily.   Cholecalciferol (VITAMIN D3) 25 MCG (1000 UT) CHEW Chew 25 mcg by mouth daily.   clopidogrel (PLAVIX) 75 MG tablet TAKE 1 TABLET BY MOUTH DAILY. PLEASE KEEP UPCOMING APPT IN JUNE 2022 BEFORE ANYMORE REFILLS.   Glycerin-Hypromellose-PEG 400 ([employer] DRY EYE RELIEF) 0.2-0.2-1 % SOLN Place 1 drop into both eyes daily as needed (dry eyes).   HUMALOG KWIKPEN 200 UNIT/ML KwikPen Inject 206 Units into the skin as directed. (Administered via insulin pump)   ibuprofen (ADVIL) 200 MG tablet Take 200 mg by mouth every 6 (six) hours as needed for headache or mild pain.   ipratropium-albuterol (DUONEB) 0.5-2.5 (3) MG/3ML SOLN Take 3 mLs by nebulization every 2 (two) hours as needed.   isosorbide mononitrate (IMDUR) 30 MG 24 hr tablet TAKE 1 TABLET BY MOUTH EVERY DAY   KLOR-CON M20 20 MEQ tablet TAKE 1 TABLET BY MOUTH EVERY DAY   metoprolol succinate (TOPROL-XL) 100 MG 24 hr tablet Take 1 tablet (100 mg total) by mouth daily.   nitroGLYCERIN (NITROSTAT) 0.4 MG SL tablet Place 1 tablet (0.4 mg total) under the tongue every 5 (five) minutes as needed for chest pain.   pantoprazole (PROTONIX) 40 MG tablet Take 40 mg by mouth daily.   prednisoLONE acetate (PRED FORTE) 1 % ophthalmic suspension Place 1 drop into the left eye 4 (four) times daily.   rosuvastatin (CRESTOR) 40 MG tablet Take 1 tablet (40 mg total) by mouth daily.   sacubitril-valsartan (ENTRESTO) 49-51 MG Take 1 tablet by mouth 2 (two) times daily.   torsemide (DEMADEX) 20 MG tablet TAKE 1 TABLET BY MOUTH EVERY DAY     Allergies:   Spironolactone   Social History   Socioeconomic History   Marital status: Single    Spouse name: Not on file   Number of children: Not on file   Years of education: Not on file   Highest education level: Not on file  Occupational History   Not on file  Tobacco Use   Smoking status: Never    Smokeless tobacco: Never  Vaping Use   Vaping Use: Never used  Substance and Sexual Activity   Alcohol use: Yes   Drug use: No   Sexual activity: Not on file  Other Topics Concern   Not on file  Social History Narrative   The patient is married. He has grown children. He is a lifelong nonsmoker. He works in Production designer, theatre/television/film.   Social Determinants of Health   Financial Resource Strain: Not on file  Food Insecurity: Not on file  Transportation Needs: Not on file  Physical Activity: Not on file  Stress: Not on file  Social Connections: Not on file     Family History: The patient's family history includes Diabetes in his brother; Healthy in his father and mother; Heart attack in his father.  There is no history of Stroke.  ROS:   Please see the history of present illness.    All other systems reviewed and are negative.  EKGs/Labs/Other Studies Reviewed:    The following studies were reviewed today: Cardiac Cath 12-12-2020: Conclusions: Severe two-vessel coronary artery disease with chronic total occlusions of the mid LAD and ostial LCx, as noted on prior catheterizations. Mild, nonobstructive RCA disease with patent PDA stent. Widely patent LIMA-LAD bypass graft.  SVG-D1 and SVG-OM1 are known to be occluded and were not engaged on today's study. Mildly elevated left ventricular filling pressure (LVEDP 15-20 mmHg) with grossly normal left ventricular contraction.   Recommendations: No culprit lesion for patient's elevated troponin identified.  There may be slight worsening of disease involving distal D1 branch.  I suspect his troponin elevation reflects supply-demand mismatch in the setting of respiratory illness and significant fixed CAD.  Continue secondary prevention of coronary artery disease. Maintain net even to slightly negative fluid balance. Further work-up and management of acute respiratory illness per internal medicine.  I do not believe the patient's coronary artery  disease and mildly elevated filling pressures explain his symptoms and worsening hypoxia.  Echo 12-13-2020:  1. Moderate hypertrophy of the basal septum with otherwise mild  concentric LVH. Left ventricular ejection fraction, by estimation, is 55  to 60%. The left ventricle has normal function. The left ventricle has no  regional wall motion abnormalities. There  is moderate left ventricular hypertrophy of the basal-septal segment. Left  ventricular diastolic parameters are consistent with Grade II diastolic  dysfunction (pseudonormalization). Elevated left ventricular end-diastolic  pressure.   2. Right ventricular systolic function is normal. The right ventricular  size is normal.   3. The mitral valve is normal in structure. Trivial mitral valve  regurgitation. No evidence of mitral stenosis.   4. The aortic valve is tricuspid. Aortic valve regurgitation is trivial.  No aortic stenosis is present.   5. The inferior vena cava is normal in size with greater than 50%  respiratory variability, suggesting right atrial pressure of 3 mmHg.   EKG:  EKG is ordered today.  The ekg ordered today demonstrates NSR 84 bpm, rightward axis, possible inferior infarct age-undetermined  Recent Labs: 07/23/2021: BUN 15; Creatinine, Ser 1.04; Potassium 3.9; Sodium 141  Recent Lipid Panel    Component Value Date/Time   CHOL 91 12/12/2020 0612   CHOL 123 09/02/2016 0846   TRIG 102 12/12/2020 0612   HDL 33 (L) 12/12/2020 0612   HDL 45 09/02/2016 0846   CHOLHDL 2.8 12/12/2020 0612   VLDL 20 12/12/2020 0612   LDLCALC 38 12/12/2020 0612   LDLCALC 57 09/02/2016 0846     Risk Assessment/Calculations:                Physical Exam:    VS:  BP 132/78   Pulse 84   Ht 5\' 8"  (1.727 m)   Wt 279 lb (126.6 kg)   SpO2 94%   BMI 42.42 kg/m     Wt Readings from Last 3 Encounters:  02/12/22 279 lb (126.6 kg)  10/22/21 275 lb (124.7 kg)  07/23/21 276 lb 2 oz (125.2 kg)     GEN: Pleasant obese  gentleman in no acute distress HEENT: Normal NECK: No JVD; No carotid bruits LYMPHATICS: No lymphadenopathy CARDIAC: RRR, no murmurs, rubs, gallops RESPIRATORY:  Clear to auscultation without rales, wheezing or rhonchi  ABDOMEN: Soft, non-tender, non-distended MUSCULOSKELETAL:  No edema; No deformity  SKIN: Warm and dry NEUROLOGIC:  Alert and oriented x 3 PSYCHIATRIC:  Normal affect   ASSESSMENT:    1. Coronary artery disease of native artery of native heart with stable angina pectoris (HCC)   2. Essential hypertension   3. Chronic diastolic congestive heart failure (HCC)   4. Mixed hyperlipidemia    PLAN:    In order of problems listed above:  The patient is currently stable with no symptoms of angina.  He remains on aspirin and clopidogrel, antianginal program includes isosorbide and metoprolol succinate, and he is on a high intensity statin drug with rosuvastatin 40 mg daily. Blood pressure well-controlled on current medical therapy.  Lifestyle modification discussed. NYHA functional class II symptoms with reduced exercise tolerance and bendopnea.  Much of his symptoms are related to abdominal obesity.  Lengthy discussion today regarding his weight, need for exercise, and recommendations around dietary modification.  He will continue on his current medical program which includes torsemide 20 mg daily, Entresto, and metoprolol succinate. Followed closely at [facility].  Treated with a high intensity statin drug.  Lifestyle modification discussed.  Last lipids with an LDL cholesterol of 38.           Medication Adjustments/Labs and Tests Ordered: Current medicines are reviewed at length with the patient today.  Concerns regarding medicines are outlined above.  Orders Placed This Encounter  Procedures   EKG 12-Lead   No orders of the defined types were placed in this encounter.   Patient Instructions  Medication Instructions:  Your physician recommends that you  continue on your current medications as directed. Please refer to the Current Medication list given to you today.  *If you need a refill on your cardiac medications before your next appointment, please call your pharmacy*  Lab Work: If you have labs (blood work) drawn today and your tests are completely normal, you will receive your results only by: MyChart Message (if you have MyChart) OR A paper copy in the mail If you have any lab test that is abnormal or we need to change your treatment, we will call you to review the results.  Testing/Procedures: None ordered today.  Follow-Up: At [facility], you and your health needs are our priority.  As part of our continuing mission to provide you with exceptional heart care, we have created designated Provider Care Teams.  These Care Teams include your primary Cardiologist (physician) and Advanced Practice Providers (APPs -  Physician Assistants and Nurse Practitioners) who all work together to provide you with the care you need, when you need it.  We recommend signing up for the patient portal called "MyChart".  Sign up information is provided on this After Visit Summary.  MyChart is used to connect with patients for Virtual Visits (Telemedicine).  Patients are able to view lab/test results, encounter notes, upcoming appointments, etc.  Non-urgent messages can be sent to your provider as well.   To learn more about what you can do with MyChart, go to [email or web address].    Your next appointment:   1 year(s)  The format for your next appointment:   In Person  Provider:   Tonny Bollman, MD      Important Information About Sugar         Signed, Tonny Bollman, MD  02/12/2022 1:37 PM    [facility]

## 2022-03-21 ENCOUNTER — Other Ambulatory Visit: Payer: Self-pay | Admitting: Cardiovascular Disease

## 2022-04-22 ENCOUNTER — Ambulatory Visit: Payer: Medicare Other | Attending: Family | Admitting: Family

## 2022-04-22 ENCOUNTER — Encounter: Payer: Self-pay | Admitting: Family

## 2022-04-22 VITALS — BP 108/67 | HR 67 | Resp 14 | Wt 278.1 lb

## 2022-04-22 DIAGNOSIS — E109 Type 1 diabetes mellitus without complications: Secondary | ICD-10-CM | POA: Diagnosis not present

## 2022-04-22 DIAGNOSIS — I890 Lymphedema, not elsewhere classified: Secondary | ICD-10-CM | POA: Diagnosis not present

## 2022-04-22 DIAGNOSIS — I2510 Atherosclerotic heart disease of native coronary artery without angina pectoris: Secondary | ICD-10-CM | POA: Insufficient documentation

## 2022-04-22 DIAGNOSIS — E785 Hyperlipidemia, unspecified: Secondary | ICD-10-CM | POA: Diagnosis not present

## 2022-04-22 DIAGNOSIS — I10 Essential (primary) hypertension: Secondary | ICD-10-CM | POA: Diagnosis not present

## 2022-04-22 DIAGNOSIS — I5032 Chronic diastolic (congestive) heart failure: Secondary | ICD-10-CM

## 2022-04-22 DIAGNOSIS — I25118 Atherosclerotic heart disease of native coronary artery with other forms of angina pectoris: Secondary | ICD-10-CM

## 2022-04-22 DIAGNOSIS — Z794 Long term (current) use of insulin: Secondary | ICD-10-CM | POA: Insufficient documentation

## 2022-04-22 DIAGNOSIS — Z7902 Long term (current) use of antithrombotics/antiplatelets: Secondary | ICD-10-CM | POA: Insufficient documentation

## 2022-04-22 DIAGNOSIS — Z7982 Long term (current) use of aspirin: Secondary | ICD-10-CM | POA: Diagnosis not present

## 2022-04-22 DIAGNOSIS — Z79899 Other long term (current) drug therapy: Secondary | ICD-10-CM | POA: Diagnosis not present

## 2022-04-22 DIAGNOSIS — I110 Hypertensive heart disease with heart failure: Secondary | ICD-10-CM | POA: Diagnosis present

## 2022-04-22 MED ORDER — NITROGLYCERIN 0.4 MG SL SUBL: 0.4000 mg | SUBLINGUAL_TABLET | SUBLINGUAL | 2 refills | Status: AC | PRN

## 2022-04-22 NOTE — Progress Notes (Signed)
Patient ID: Danny [name], male    DOB: 1955-05-18, 67 y.o.   MRN: [id number]  HPI  Danny [name] is a 67 y/o male with a history of CAD, DM, hyperlipidemia, HTN, carotid stenosis and chronic heart failure.   Echo report from 12/13/20 reviewed and showed an EF of 55-60% along with moderate LVH and trivial Danny.   LHC done 12/12/20 and showed: Severe two-vessel coronary artery disease with chronic total occlusions of the mid LAD and ostial LCx, as noted on prior catheterizations. Mild, nonobstructive RCA disease with patent PDA stent. Widely patent LIMA-LAD bypass graft.  SVG-D1 and SVG-OM1 are known to be occluded and were not engaged on today's study. Mildly elevated left ventricular filling pressure (LVEDP 15-20 mmHg) with grossly normal left ventricular contraction.  Hasn't been admitted or been in the ED in the last 6 months.   He presents today for a HF follow-up visit with a chief complaint of minimal fatigue upon moderate exertion. Describes this as chronic in nature. Has associated left ankle swelling & chronic difficulty sleeping along with this. Denies any abdominal distention, palpitations, chest pain, wheezing, SOB, cough, dizziness or weight gain.   Past Medical History:  Diagnosis Date   Acute MI, lateral wall, initial episode of care ([place]) 03/23/2014   CAD (coronary artery disease)    a. s/p Lat MI 2/16 >> s/p CABG; b. LHC 1/17: mLAD 100, oD1 85%, oLCx 100, OM1 filled by collats from RPLB2, RPDA stent 95% ISR, L-LAD patent, S-OM1 occluded, S-D1 occluded >> PCI: Promus DES to RPDA  - residual CAD not amenable to PCI (dist vessel/diabetic appearance)    Carotid stenosis    a. Carotid US 2/19 bilat ICA 1-39%   Chronic combined systolic and diastolic CHF (congestive heart failure) ([place])    Hyperlipidemia 10/16/2014   Hypertension    Ischemic cardiomyopathy    a. Echo at [facility] 3/16:  EF 35-40%, diff HK, mild LVH, mild LAE, mild TR;  b. Echo 7/16: mild LVH, EF 50%, inf and  lat HK-AK, Gr 2 DD, mod LAE, mild RVE, reduced RVSF, trivial TR, PASP 30 mmHg // c. Echo 5/17: moderate LVH, EF 45-50%, anteroseptal HK, grade 1 diastolic dysfunction, aortic sclerosis    Type 2 diabetes mellitus ([place]) 03/23/2014   Past Surgical History:  Procedure Laterality Date   CARDIAC CATHETERIZATION N/A 02/24/2015   Procedure: Left Heart Cath and Cors/Grafts Angiography;  Surgeon: Sherren Mocha, MD;  Location: [place] CV LAB;  Service: Cardiovascular;  Laterality: N/A;   CARDIAC CATHETERIZATION N/A 02/24/2015   Procedure: Coronary Stent Intervention;  Surgeon: Sherren Mocha, MD;  Location: [place] CV LAB;  Service: Cardiovascular;  Laterality: N/A;   CORONARY ARTERY BYPASS GRAFT N/A 03/28/2014   Procedure: CORONARY ARTERY BYPASS GRAFTING (CABG);  Surgeon: Melrose Nakayama, MD;  Location: [place];  Service: Open Heart Surgery;  Laterality: N/A;   LEFT HEART CATH AND CORS/GRAFTS ANGIOGRAPHY N/A 05/07/2019   Procedure: LEFT HEART CATH AND CORS/GRAFTS ANGIOGRAPHY;  Surgeon: Sherren Mocha, MD;  Location: [place] CV LAB;  Service: Cardiovascular;  Laterality: N/A;   LEFT HEART CATH AND CORS/GRAFTS ANGIOGRAPHY N/A 12/12/2020   Procedure: LEFT HEART CATH AND CORS/GRAFTS ANGIOGRAPHY;  Surgeon: Nelva Bush, MD;  Location: [place] CV LAB;  Service: Cardiovascular;  Laterality: N/A;   LEFT HEART CATHETERIZATION WITH CORONARY ANGIOGRAM N/A 03/23/2014   Procedure: LEFT HEART CATHETERIZATION WITH CORONARY ANGIOGRAM;  Surgeon: Blane Ohara, MD;  Location: [facility] CATH LAB;  Service: Cardiovascular;  Laterality: N/A;   PERCUTANEOUS CORONARY STENT INTERVENTION (PCI-S)  02/24/2015   PDA   TEE WITHOUT CARDIOVERSION N/A 03/28/2014   Procedure: TRANSESOPHAGEAL ECHOCARDIOGRAM (TEE);  Surgeon: Melrose Nakayama, MD;  Location: [place];  Service: Open Heart Surgery;  Laterality: N/A;   Family History  Problem Relation Age of Onset   Healthy Mother        No history of CAD   Healthy Father         No history of CAD   Heart attack Father    Diabetes Brother    Stroke Neg Hx    Social History   Tobacco Use   Smoking status: Never   Smokeless tobacco: Never  Substance Use Topics   Alcohol use: Yes   Allergies  Allergen Reactions   Spironolactone Other (See Comments)    Other reaction(s): Unknown Increased creatinine Increased creatinine   Prior to Admission medications   Medication Sig Start Date End Date Taking? Authorizing Provider  albuterol (VENTOLIN HFA) 108 (90 Base) MCG/ACT inhaler Inhale 1-2 puffs into the lungs every 6 (six) hours as needed for wheezing or shortness of breath.   Yes [provider]  aspirin EC 81 MG tablet Take 1 tablet (81 mg total) by mouth daily. 04/30/19  Yes Sherren Mocha, MD  clopidogrel (PLAVIX) 75 MG tablet TAKE 1 TABLET BY MOUTH DAILY. PLEASE KEEP UPCOMING APPT IN JUNE 2022 BEFORE ANYMORE REFILLS. 07/31/21  Yes Sherren Mocha, MD  Glycerin-Hypromellose-PEG 400 ([facility] DRY EYE RELIEF) 0.2-0.2-1 % SOLN Place 1 drop into both eyes daily as needed (dry eyes).   Yes [provider]  HUMALOG KWIKPEN 200 UNIT/ML KwikPen Inject 206 Units into the skin as directed. (Administered via insulin pump)   Yes [provider]  ibuprofen (ADVIL) 200 MG tablet Take 200 mg by mouth every 6 (six) hours as needed for headache or mild pain.   Yes [provider]  ipratropium-albuterol (DUONEB) 0.5-2.5 (3) MG/3ML SOLN Take 3 mLs by nebulization every 2 (two) hours as needed. 12/16/20  Yes Allie Bossier, MD  isosorbide mononitrate (IMDUR) 30 MG 24 hr tablet TAKE 1 TABLET BY MOUTH EVERY DAY 08/11/21  Yes Sherren Mocha, MD  KLOR-CON M20 20 MEQ tablet TAKE 1 TABLET BY MOUTH EVERY DAY 12/23/21  Yes Darylene Price A, FNP  metoprolol succinate (TOPROL-XL) 100 MG 24 hr tablet TAKE 1 TABLET BY MOUTH EVERY DAY 03/23/22  Yes Sherren Mocha, MD  pantoprazole (PROTONIX) 40 MG tablet Take 40 mg by mouth daily. 10/18/20  Yes [provider]  prednisoLONE acetate (PRED FORTE) 1 % ophthalmic suspension Place 1 drop into the left eye 4 (four) times daily. 05/06/21  Yes [provider]  rosuvastatin (CRESTOR) 40 MG tablet Take 1 tablet (40 mg total) by mouth daily. 01/28/22  Yes Sherren Mocha, MD  sacubitril-valsartan (ENTRESTO) 49-51 MG Take 1 tablet by mouth 2 (two) times daily. 07/23/21  Yes Darylene Price A, FNP  torsemide (DEMADEX) 20 MG tablet TAKE 1 TABLET BY MOUTH EVERY DAY 06/28/21  Yes Darylene Price A, FNP  nitroGLYCERIN (NITROSTAT) 0.4 MG SL tablet Place 1 tablet (0.4 mg total) under the tongue every 5 (five) minutes as needed for chest pain. 04/22/22   Alisa Graff, FNP   Review of Systems  Constitutional:  Positive for fatigue. Negative for appetite change.  HENT:  Negative for congestion, postnasal drip and sore throat.   Eyes: Negative.   Respiratory:  Negative for cough, chest tightness, shortness of breath  and wheezing.   Cardiovascular:  Positive for leg swelling (left ankle). Negative for chest pain and palpitations.  Gastrointestinal:  Negative for abdominal distention and abdominal pain.  Endocrine: Negative.   Genitourinary: Negative.   Musculoskeletal:  Negative for arthralgias, back pain, myalgias and neck pain.  Skin: Negative.   Allergic/Immunologic: Negative.   Neurological:  Negative for dizziness, weakness and light-headedness.  Hematological:  Negative for adenopathy. Does not bruise/bleed easily.  Psychiatric/Behavioral:  Positive for sleep disturbance (wake frequently; sleeping on 1 pillow). Negative for dysphoric mood. The patient is not nervous/anxious.    Vitals:   04/22/22 1322  BP: 108/67  Pulse: 67  Resp: 14  SpO2: 95%  Weight: 278 lb 2 oz (126.2 kg)   Wt Readings from Last 3 Encounters:  04/22/22 278 lb 2 oz (126.2 kg)  02/12/22 279 lb (126.6 kg)  10/22/21 275 lb (124.7 kg)   Lab Results  Component Value Date   CREATININE 1.04 07/23/2021   CREATININE 1.18 03/23/2021    CREATININE 1.31 (H) 02/18/2021   Physical Exam Vitals and nursing note reviewed.  Constitutional:      Appearance: Normal appearance.  HENT:     Head: Normocephalic and atraumatic.  Cardiovascular:     Rate and Rhythm: Normal rate and regular rhythm.  Pulmonary:     Effort: Pulmonary effort is normal. No respiratory distress.     Breath sounds: No wheezing or rales.  Abdominal:     General: There is no distension.     Palpations: Abdomen is soft.  Musculoskeletal:        General: No tenderness.     Cervical back: Normal range of motion and neck supple.     Right lower leg: Edema (trace pitting) present.     Left lower leg: Edema (trace pitting) present.  Skin:    General: Skin is warm and dry.  Neurological:     General: No focal deficit present.     Mental Status: He is alert and oriented to person, place, and time.  Psychiatric:        Mood and Affect: Mood normal.        Behavior: Behavior normal.        Thought Content: Thought content normal.   Assessment & Plan:  1: Chronic heart failure with preserved ejection fraction with structural changes (LVH)- - NYHA class II - euvolemic today - weighing daily; reminded to call for an overnight weight gain of > 2 pounds or a weekly weight gain of > 5 pounds - weight up 3 pounds from last visit here 6 months ago - does add salt to eggs but says "that's about it" - echo 12/13/20: EF of 55-60% along with moderate LVH and trivial Danny.  - LHC done 12/12/20: Severe two-vessel coronary artery disease with chronic total occlusions of the mid LAD and ostial LCx, as noted on prior catheterizations. Mild, nonobstructive RCA disease with patent PDA stent. Widely patent LIMA-LAD bypass graft.  SVG-D1 and SVG-OM1 are known to be occluded and were not engaged on today's study. Mildly elevated left ventricular filling pressure (LVEDP 15-20 mmHg) with grossly normal left ventricular contraction. - have scheduled echo for 05/18/22 - entresto 49/'51mg'$   BID - torsemide '20mg'$  daily - metoprolol succinate '100mg'$  daily - K[place]-con 62mq daily - Type 1 diabetic so not a candidate for jardiance - spironolactone caused creatinine to rise - BNP 12/12/20 was 2230   2: HTN- - BP 108/67 - saw PCP (Michaelle Copas 03/19/22 - BMP 01/13/22  reviewed and showed sodium 140, potassium 4.1, creatinine 1.1 & GFR 74  3: Type 1 DM- - saw endocrinology Honor Junes) 12/25/21 - A1c 12/25/21 was 7.2% - home glucose this morning was 138  4: CAD- - saw cardiology Burt Knack) 02/12/22 - rosuvastatin '40mg'$  daily - isosorbide MN '30mg'$  daily - clopidogrel '75mg'$  daily - ASA '81mg'$  daily  5: Lymphedema- - stage 2 - stable - had been wearing compression socks at night while sleeping; instructed to put them on in the morning and remove them at bedtime   Medication bottles reviewed.   Return in 1 month, sooner if needed

## 2022-04-22 NOTE — Patient Instructions (Addendum)
I have sent your nitroglycerin to the [facility] in [place]   Put your compression socks on in the morning and remove them at bedtime. Elevate your legs when sitting for long periods of time    Your physician has requested that you have an echocardiogram. Echocardiography is a painless test that uses sound waves to create images of your heart. It provides your doctor with information about the size and shape of your heart and how well your heart's chambers and valves are working. This procedure takes approximately one hour. There are no restrictions for this procedure. Please do NOT wear cologne, perfume, aftershave, or lotions (deodorant is allowed). Please arrive 15 minutes prior to your appointment time.   You will have a follow up appointment with Otila Kluver NP a week after your ECHO. The office will call you to schedule this appointment.

## 2022-05-01 ENCOUNTER — Other Ambulatory Visit: Payer: Self-pay | Admitting: Cardiovascular Disease

## 2022-05-18 ENCOUNTER — Ambulatory Visit: Admission: RE | Admit: 2022-05-18 | Payer: Medicare Other | Source: Ambulatory Visit

## 2022-05-20 ENCOUNTER — Telehealth: Payer: Self-pay

## 2022-05-20 NOTE — Telephone Encounter (Signed)
[employer] Patient Assitance application was completed and faxed.  Pt aware.

## 2022-05-21 ENCOUNTER — Other Ambulatory Visit: Payer: Self-pay | Admitting: *Deleted

## 2022-05-21 MED ORDER — SACUBITRIL-VALSARTAN 49-51 MG PO TABS: 1.0000 | ORAL_TABLET | Freq: Two times a day (BID) | ORAL | 3 refills | Status: DC

## 2022-05-24 ENCOUNTER — Other Ambulatory Visit: Payer: Self-pay

## 2022-05-24 MED ORDER — SACUBITRIL-VALSARTAN 49-51 MG PO TABS: 1.0000 | ORAL_TABLET | Freq: Two times a day (BID) | ORAL | 3 refills | Status: DC

## 2022-05-25 ENCOUNTER — Encounter: Payer: Medicare Other | Admitting: Family

## 2022-05-27 ENCOUNTER — Other Ambulatory Visit: Payer: Self-pay | Admitting: Cardiovascular Disease

## 2022-05-27 DIAGNOSIS — I25118 Atherosclerotic heart disease of native coronary artery with other forms of angina pectoris: Secondary | ICD-10-CM

## 2022-05-27 DIAGNOSIS — I5042 Chronic combined systolic (congestive) and diastolic (congestive) heart failure: Secondary | ICD-10-CM

## 2022-06-14 ENCOUNTER — Ambulatory Visit
Admission: RE | Admit: 2022-06-14 | Discharge: 2022-06-14 | Disposition: A | Discharge status: Home / Self Care | Payer: Medicare Other | Source: Ambulatory Visit | Attending: Family | Admitting: Family

## 2022-06-14 DIAGNOSIS — E119 Type 2 diabetes mellitus without complications: Secondary | ICD-10-CM | POA: Insufficient documentation

## 2022-06-14 DIAGNOSIS — Z951 Presence of aortocoronary bypass graft: Secondary | ICD-10-CM | POA: Insufficient documentation

## 2022-06-14 DIAGNOSIS — I110 Hypertensive heart disease with heart failure: Secondary | ICD-10-CM | POA: Insufficient documentation

## 2022-06-14 DIAGNOSIS — I5032 Chronic diastolic (congestive) heart failure: Secondary | ICD-10-CM | POA: Diagnosis present

## 2022-06-14 DIAGNOSIS — E785 Hyperlipidemia, unspecified: Secondary | ICD-10-CM | POA: Diagnosis not present

## 2022-06-14 LAB — ECHOCARDIOGRAM COMPLETE
AR max vel: 3.05 cm2
AV Area VTI: 3.8 cm2
AV Area mean vel: 2.95 cm2
AV Mean grad: 1.5 mmHg
AV Peak grad: 3 mmHg
Ao pk vel: 0.87 m/s
Area-P 1/2: 4.19 cm2
MV VTI: 2.4 cm2
S' Lateral: 3 cm

## 2022-06-14 NOTE — Progress Notes (Signed)
*  PRELIMINARY RESULTS* Echocardiogram 2D Echocardiogram has been performed.  Danny [name] 06/14/2022, 9:14 AM

## 2022-06-17 ENCOUNTER — Other Ambulatory Visit: Payer: Self-pay | Admitting: Family

## 2022-06-17 ENCOUNTER — Other Ambulatory Visit: Payer: Self-pay | Admitting: Cardiovascular Disease

## 2022-06-28 ENCOUNTER — Ambulatory Visit: Payer: Medicare Other | Attending: Family | Admitting: Family

## 2022-06-28 ENCOUNTER — Other Ambulatory Visit (HOSPITAL_COMMUNITY): Payer: Self-pay

## 2022-06-28 ENCOUNTER — Encounter: Payer: Self-pay | Admitting: Family

## 2022-06-28 VITALS — BP 129/61 | HR 73 | Wt 282.2 lb

## 2022-06-28 DIAGNOSIS — I110 Hypertensive heart disease with heart failure: Secondary | ICD-10-CM | POA: Insufficient documentation

## 2022-06-28 DIAGNOSIS — Z833 Family history of diabetes mellitus: Secondary | ICD-10-CM | POA: Insufficient documentation

## 2022-06-28 DIAGNOSIS — Z79899 Other long term (current) drug therapy: Secondary | ICD-10-CM | POA: Insufficient documentation

## 2022-06-28 DIAGNOSIS — Z7902 Long term (current) use of antithrombotics/antiplatelets: Secondary | ICD-10-CM | POA: Insufficient documentation

## 2022-06-28 DIAGNOSIS — I5032 Chronic diastolic (congestive) heart failure: Secondary | ICD-10-CM | POA: Diagnosis present

## 2022-06-28 DIAGNOSIS — Z7982 Long term (current) use of aspirin: Secondary | ICD-10-CM | POA: Insufficient documentation

## 2022-06-28 DIAGNOSIS — I25118 Atherosclerotic heart disease of native coronary artery with other forms of angina pectoris: Secondary | ICD-10-CM

## 2022-06-28 DIAGNOSIS — Z955 Presence of coronary angioplasty implant and graft: Secondary | ICD-10-CM | POA: Diagnosis not present

## 2022-06-28 DIAGNOSIS — I2510 Atherosclerotic heart disease of native coronary artery without angina pectoris: Secondary | ICD-10-CM | POA: Insufficient documentation

## 2022-06-28 DIAGNOSIS — Z8249 Family history of ischemic heart disease and other diseases of the circulatory system: Secondary | ICD-10-CM | POA: Diagnosis not present

## 2022-06-28 DIAGNOSIS — I890 Lymphedema, not elsewhere classified: Secondary | ICD-10-CM | POA: Diagnosis not present

## 2022-06-28 DIAGNOSIS — E109 Type 1 diabetes mellitus without complications: Secondary | ICD-10-CM | POA: Diagnosis not present

## 2022-06-28 DIAGNOSIS — I10 Essential (primary) hypertension: Secondary | ICD-10-CM

## 2022-06-28 DIAGNOSIS — Z794 Long term (current) use of insulin: Secondary | ICD-10-CM | POA: Diagnosis not present

## 2022-06-28 NOTE — Progress Notes (Signed)
[facility] HEART FAILURE CLINIC - Pharmacist Note  Noan Witko is a 67 y.o. male with HFpEF (EF >50%) presenting to the Heart Failure Clinic for follow up. Patient reports doing well on current regimen. He reports that he weighs himself once weekly on Mondays and that his weight is usually around 275-280 lbs. He reports no signs or symptoms of volume overload. He expresses interest in weight loss, but unfortunately is a poor candidate for GLP-1RA given history of T1DM.   Patient has just returned home from a [place] cruise during which he was very active. He reported no issues with shortness of breath or limitations to his activity while on the cruise. He does report some leg swelling in the setting of significant sunburn. This has improved with time, though some residual redness and swelling is present today.  Recent ED Visit (past 6 months): none  Guideline-Directed Medical Therapy/Evidence Based Medicine ACE/ARB/ARNI: Sacubitril/valsartan 49/51 mg BID Beta Blocker: Metoprolol succinate 100 mg daily Aldosterone Antagonist:  none Diuretic: Torsemide 20 mg daily SGLT2i:  none  Adherence Assessment Do you ever forget to take your medication? [] Yes [x] No  Do you ever skip doses due to side effects? [] Yes [x] No  Do you have trouble affording your medicines? [] Yes [x] No  Are you ever unable to pick up your medication due to transportation difficulties? [] Yes [x] No  Do you ever stop taking your medications because you don't believe they are helping? [] Yes [x] No  Do you check your weight daily? [] Yes [x] No (weekly)  Adherence strategy: pill box Barriers to obtaining medications: none reported  Diagnostics ECHO: Date 06/14/2022, EF 50-55%, no RWMA  Vitals    06/28/2022    3:03 PM 04/22/2022    1:22 PM 02/12/2022   10:43 AM  Vitals with BMI  Height   5\' 8"   Weight 282 lbs 3 oz 278 lbs 2 oz 279 lbs  BMI   42.43  Systolic 129 108 027  Diastolic 61 67 78  Pulse 73 67 84      Recent Labs    Latest Ref Rng & Units 07/23/2021   11:22 AM 03/23/2021   12:14 PM 02/18/2021   11:15 AM  BMP  Glucose 70 - 99 mg/dL 253  664  403   BUN 8 - 23 mg/dL 15  15  12    Creatinine 0.61 - 1.24 mg/dL 4.74  2.59  5.63   Sodium 135 - 145 mmol/L 141  137  137   Potassium 3.5 - 5.1 mmol/L 3.9  3.9  4.0   Chloride 98 - 111 mmol/L 108  107  107   CO2 22 - 32 mmol/L 25  24  25    Calcium 8.9 - 10.3 mg/dL 9.0  8.9  8.8     Past Medical History Past Medical History:  Diagnosis Date   Acute MI, lateral wall, initial episode of care (HCC) 03/23/2014   CAD (coronary artery disease)    a. s/p Lat MI 2/16 >> s/p CABG; b. LHC 1/17: mLAD 100, oD1 85%, oLCx 100, OM1 filled by collats from RPLB2, RPDA stent 95% ISR, L-LAD patent, S-OM1 occluded, S-D1 occluded >> PCI: Promus DES to RPDA  - residual CAD not amenable to PCI (dist vessel/diabetic appearance)    Carotid stenosis    a. Carotid US 2/19 bilat ICA 1-39%   Chronic combined systolic and diastolic CHF (congestive heart failure) (HCC)    Hyperlipidemia 10/16/2014   Hypertension    Ischemic cardiomyopathy    a. Echo at [facility]  3/16:  EF 35-40%, diff HK, mild LVH, mild LAE, mild TR;  b. Echo 7/16: mild LVH, EF 50%, inf and lat HK-AK, Gr 2 DD, mod LAE, mild RVE, reduced RVSF, trivial TR, PASP 30 mmHg // c. Echo 5/17: moderate LVH, EF 45-50%, anteroseptal HK, grade 1 diastolic dysfunction, aortic sclerosis    Type 2 diabetes mellitus (HCC) 03/23/2014   Plan Continue current regimen per NP Encourage weight loss via diet modifications and routine exercise Consider titration of Entresto as BP allows  Time spent: 20 minutes  Celene Squibb, PharmD PGY1 Pharmacy Resident 06/28/2022 3:43 PM

## 2022-06-28 NOTE — Patient Instructions (Addendum)
Ask Dr. Gershon Crane about weight loss injections   Go to the registration desk in the Medical [facility] entrance on Friday between the hours of 8am-4pm to get your lab work drawn.    For the next 3 days, increase your torsemide to 2 tablets daily.

## 2022-06-28 NOTE — Progress Notes (Signed)
PCP: Laren Everts, NP (last seen 02/24) Primary Cardiologist: Tonny Bollman, MD (last seen 12/23)  HPI:  Danny [name] is a 67 y/o male with a history of CAD, DM, hyperlipidemia, HTN, carotid stenosis and chronic heart failure.   Echo 06/14/22: EF 50-55% along with mild Danny. Echo 12/13/20: EF of 55-60% along with moderate LVH and trivial Danny.   LHC done 12/12/20 and showed: Severe two-vessel coronary artery disease with chronic total occlusions of the mid LAD and ostial LCx, as noted on prior catheterizations. Mild, nonobstructive RCA disease with patent PDA stent. Widely patent LIMA-LAD bypass graft.  SVG-D1 and SVG-OM1 are known to be occluded and were not engaged on today's study. Mildly elevated left ventricular filling pressure (LVEDP 15-20 mmHg) with grossly normal left ventricular contraction.  Hasn't been admitted or been in the ED in the last 6 months.   He presents today for a HF follow-up visit with a chief complaint of minimal SOB with moderate exertion. Chronic in nature. Has associated fatigue and edema bilaterally. He says that the leg swelling has worsened along with sunburn on bilateral shins.   He was just on a 1 week cruise to the [place] and just got back last night. Says that on the boat he was eating all sorts of food such as chinese/ korean foods along with drinking alcohol. Has continued to take his torsemide 20mg  once daily.   ROS: All systems negative except as listed in HPI, PMH and Problem List.  SH:  Social History   Socioeconomic History   Marital status: Single    Spouse name: Not on file   Number of children: Not on file   Years of education: Not on file   Highest education level: Not on file  Occupational History   Not on file  Tobacco Use   Smoking status: Never   Smokeless tobacco: Never  Vaping Use   Vaping Use: Never used  Substance and Sexual Activity   Alcohol use: Yes   Drug use: No   Sexual activity: Not on file  Other Topics  Concern   Not on file  Social History Narrative   The patient is married. He has grown children. He is a lifelong nonsmoker. He works in Production designer, theatre/television/film.   Social Determinants of Health   Financial Resource Strain: Not on file  Food Insecurity: Not on file  Transportation Needs: Not on file  Physical Activity: Not on file  Stress: Not on file  Social Connections: Not on file  Intimate Partner Violence: Not on file    FH:  Family History  Problem Relation Age of Onset   Healthy Mother        No history of CAD   Healthy Father        No history of CAD   Heart attack Father    Diabetes Brother    Stroke Neg Hx     Past Medical History:  Diagnosis Date   Acute MI, lateral wall, initial episode of care (HCC) 03/23/2014   CAD (coronary artery disease)    a. s/p Lat MI 2/16 >> s/p CABG; b. LHC 1/17: mLAD 100, oD1 85%, oLCx 100, OM1 filled by collats from RPLB2, RPDA stent 95% ISR, L-LAD patent, S-OM1 occluded, S-D1 occluded >> PCI: Promus DES to RPDA  - residual CAD not amenable to PCI (dist vessel/diabetic appearance)    Carotid stenosis    a. Carotid US 2/19 bilat ICA 1-39%   Chronic combined systolic and diastolic CHF (congestive heart  failure) (HCC)    Hyperlipidemia 10/16/2014   Hypertension    Ischemic cardiomyopathy    a. Echo at [facility] 3/16:  EF 35-40%, diff HK, mild LVH, mild LAE, mild TR;  b. Echo 7/16: mild LVH, EF 50%, inf and lat HK-AK, Gr 2 DD, mod LAE, mild RVE, reduced RVSF, trivial TR, PASP 30 mmHg // c. Echo 5/17: moderate LVH, EF 45-50%, anteroseptal HK, grade 1 diastolic dysfunction, aortic sclerosis    Type 2 diabetes mellitus (HCC) 03/23/2014   Prior to Admission medications   Medication Sig Start Date End Date Taking? Authorizing Provider  albuterol (VENTOLIN HFA) 108 (90 Base) MCG/ACT inhaler Inhale 1-2 puffs into the lungs every 6 (six) hours as needed for wheezing or shortness of breath.   Yes [provider]  aspirin EC 81 MG tablet Take 1 tablet  (81 mg total) by mouth daily. 04/30/19  Yes Tonny Bollman, MD  clopidogrel (PLAVIX) 75 MG tablet Take 1 tablet (75 mg total) by mouth daily. 06/17/22  Yes Tonny Bollman, MD  Glycerin-Hypromellose-PEG 400 ([facility] DRY EYE RELIEF) 0.2-0.2-1 % SOLN Place 1 drop into both eyes daily as needed (dry eyes).   Yes [provider]  HUMALOG KWIKPEN 200 UNIT/ML KwikPen Inject 206 Units into the skin as directed. (Administered via insulin pump)   Yes [provider]  ibuprofen (ADVIL) 200 MG tablet Take 200 mg by mouth every 6 (six) hours as needed for headache or mild pain.   Yes [provider]  isosorbide mononitrate (IMDUR) 30 MG 24 hr tablet TAKE 1 TABLET BY MOUTH EVERY DAY 05/28/22  Yes Tonny Bollman, MD  KLOR-CON M20 20 MEQ tablet TAKE 1 TABLET BY MOUTH EVERY DAY 12/23/21  Yes Clarisa Kindred A, FNP  metoprolol succinate (TOPROL-XL) 100 MG 24 hr tablet TAKE 1 TABLET BY MOUTH EVERY DAY 03/23/22  Yes Tonny Bollman, MD  nitroGLYCERIN (NITROSTAT) 0.4 MG SL tablet Place 1 tablet (0.4 mg total) under the tongue every 5 (five) minutes as needed for chest pain. 04/22/22  Yes Junice Fei A, FNP  pantoprazole (PROTONIX) 40 MG tablet Take 40 mg by mouth daily. 10/18/20  Yes [provider]  prednisoLONE acetate (PRED FORTE) 1 % ophthalmic suspension Place 1 drop into the left eye 4 (four) times daily. 05/06/21  Yes [provider]  rosuvastatin (CRESTOR) 40 MG tablet TAKE 1 TABLET BY MOUTH EVERY DAY 05/03/22  Yes Tonny Bollman, MD  sacubitril-valsartan (ENTRESTO) 49-51 MG Take 1 tablet by mouth 2 (two) times daily. 05/24/22  Yes Clarisa Kindred A, FNP  torsemide (DEMADEX) 20 MG tablet TAKE 1 TABLET BY MOUTH EVERY DAY 06/17/22  Yes Tyreece Gelles A, FNP  ipratropium-albuterol (DUONEB) 0.5-2.5 (3) MG/3ML SOLN Take 3 mLs by nebulization every 2 (two) hours as needed. Patient not taking: Reported on 06/28/2022 12/16/20   Drema Dallas, MD   Vitals:   06/28/22 1503  BP: 129/61  Pulse:  73  SpO2: 96%  Weight: 282 lb 3.2 oz (128 kg)   Wt Readings from Last 3 Encounters:  06/28/22 282 lb 3.2 oz (128 kg)  04/22/22 278 lb 2 oz (126.2 kg)  02/12/22 279 lb (126.6 kg)   Lab Results  Component Value Date   CREATININE 1.04 07/23/2021   CREATININE 1.18 03/23/2021   CREATININE 1.31 (H) 02/18/2021   PHYSICAL EXAM:  General:  Well appearing. No resp difficulty HEENT: normal Neck: supple. JVP flat. No lymphadenopathy or thryomegaly appreciated. Cor: PMI normal. Regular rate & rhythm. No rubs, gallops  or murmurs. Lungs: clear Abdomen: soft, nontender, nondistended. No hepatosplenomegaly. No bruits or masses. Good bowel sounds. Extremities: no cyanosis, clubbing, rash, 2+ pitting edema bilaterally; bilateral shins are red from sunburn Neuro: alert & orientedx3, cranial nerves grossly intact. Moves all 4 extremities w/o difficulty. Affect pleasant.   ECG: not done   ASSESSMENT & PLAN:  1: Chronic ischemia heart failure with preserved ejection fraction with structural changes (LVH)- - NYHA class II - mildly fluid overloaded today with edema & weight gain - etiology likely CAD - weighing daily; reminded to call for an overnight weight gain of > 2 pounds or a weekly weight gain of > 5 pounds - weight up 4 pounds from last visit here 2 months ago - Echo 06/14/22: EF 50-55% along with mild Danny. Echo 12/13/20: EF of 55-60% along with moderate LVH and trivial Danny. (Reviewed echo results w/ patient) - does add salt to eggs but says "that's about it" - continue entresto 49/51mg  BID - increase torsemide to 40mg  daily for 3 days and then decrease it back to 20mg  daily - BMP to be checked in 4 days - if edema persists, advised him to call us back for a sooner appt - continue metoprolol succinate 100mg  daily - continue Klor-con daily - Type 1 diabetic so not a candidate for jardiance - spironolactone caused creatinine to rise - BNP 12/12/20 was 2230  - PharmD reconciled meds  w/ patient  2: HTN- - BP 129/61 - saw PCP Danny [name]) 02/24 - BMP 01/13/22 reviewed and showed sodium 140, potassium 4.1, creatinine 1.1 & GFR 74  3: Type 1 DM- - saw endocrinology Danny [name]) 03/24 - A1c 04/26/22 was 7.9% - advised to discuss with Dr Danny [name] if weight loss injections are appropriate since he's type 1  4: CAD- - saw cardiology Danny [name]) 12/23 - rosuvastatin 40mg  daily - isosorbide MN 30mg  daily - clopidogrel 75mg  daily - ASA 81mg  daily - LHC done 12/12/20: Severe two-vessel coronary artery disease with chronic total occlusions of the mid LAD and ostial LCx, as noted on prior catheterizations. Mild, nonobstructive RCA disease with patent PDA stent. Widely patent LIMA-LAD bypass graft.  SVG-D1 and SVG-OM1 are known to be occluded and were not engaged on today's study. Mildly elevated left ventricular filling pressure (LVEDP 15-20 mmHg) with grossly normal left ventricular contraction.  5: Lymphedema- - stage 2 - worsening since recent cruise and eating high sodium foods/ drinking alcohol - unable to wear compression socks due to edema and sunburn - increasing torsemide per above  Return in 1 month, sooner if needed.

## 2022-07-02 ENCOUNTER — Other Ambulatory Visit
Admission: RE | Admit: 2022-07-02 | Discharge: 2022-07-02 | Disposition: A | Discharge status: Home / Self Care | Payer: Medicare Other | Source: Ambulatory Visit | Attending: Family | Admitting: Family

## 2022-07-02 ENCOUNTER — Telehealth: Payer: Self-pay

## 2022-07-02 DIAGNOSIS — I5032 Chronic diastolic (congestive) heart failure: Secondary | ICD-10-CM | POA: Diagnosis present

## 2022-07-02 LAB — BASIC METABOLIC PANEL
Anion gap: 10 (ref 5–15)
BUN: 24 mg/dL — ABNORMAL HIGH (ref 8–23)
CO2: 22 mmol/L (ref 22–32)
Calcium: 8.7 mg/dL — ABNORMAL LOW (ref 8.9–10.3)
Chloride: 106 mmol/L (ref 98–111)
Creatinine, Ser: 1.34 mg/dL — ABNORMAL HIGH (ref 0.61–1.24)
GFR, Estimated: 58 mL/min — ABNORMAL LOW (ref >60)
Glucose, Bld: 145 mg/dL — ABNORMAL HIGH (ref 70–99)
Potassium: 3.4 mmol/L — ABNORMAL LOW (ref 3.5–5.1)
Sodium: 138 mmol/L (ref 135–145)

## 2022-07-02 NOTE — Telephone Encounter (Addendum)
   Electa Sniff, RN      Pt aware, agreeable, and verbalized understanding   Delma Freeze, FNP      Potassium level is a little low since you took extra fluid pill. Take an extra potassium tablet today and tomorrow

## 2022-07-29 ENCOUNTER — Encounter: Payer: Medicare Other | Admitting: Family

## 2022-07-29 NOTE — Progress Notes (Deleted)
PCP: Laren Everts, NP (last seen 05/24) Primary Cardiologist: Tonny Bollman, MD (last seen 12/23)  HPI:  [name] is a 67 y/o male with a history of CAD, DM, hyperlipidemia, HTN, carotid stenosis and chronic heart failure.   Echo 06/14/22: EF 50-55% along with mild MR. Echo 12/13/20: EF of 55-60% along with moderate LVH and trivial MR.   LHC done 12/12/20 and showed: Severe two-vessel coronary artery disease with chronic total occlusions of the mid LAD and ostial LCx, as noted on prior catheterizations. Mild, nonobstructive RCA disease with patent PDA stent. Widely patent LIMA-LAD bypass graft.  SVG-D1 and SVG-OM1 are known to be occluded and were not engaged on today's study. Mildly elevated left ventricular filling pressure (LVEDP 15-20 mmHg) with grossly normal left ventricular contraction.  Hasn't been admitted or been in the ED in the last 6 months.   He presents today for a HF follow-up visit with a chief complaint of   At last visit, he was advised to increase torsemide to 40mg  daily for 3 days and then decrease it back to 20mg    ROS: All systems negative except as listed in HPI, PMH and Problem List.  SH:  Social History   Socioeconomic History   Marital status: Single    Spouse name: Not on file   Number of children: Not on file   Years of education: Not on file   Highest education level: Not on file  Occupational History   Not on file  Tobacco Use   Smoking status: Never   Smokeless tobacco: Never  Vaping Use   Vaping Use: Never used  Substance and Sexual Activity   Alcohol use: Yes   Drug use: No   Sexual activity: Not on file  Other Topics Concern   Not on file  Social History Narrative   The patient is married. He has grown children. He is a lifelong nonsmoker. He works in Production designer, theatre/television/film.   Social Determinants of Health   Financial Resource Strain: Not on file  Food Insecurity: Not on file  Transportation Needs: Not on file  Physical Activity:  Not on file  Stress: Not on file  Social Connections: Not on file  Intimate Partner Violence: Not on file    FH:  Family History  Problem Relation Age of Onset   Healthy Mother        No history of CAD   Healthy Father        No history of CAD   Heart attack Father    Diabetes Brother    Stroke Neg Hx     Past Medical History:  Diagnosis Date   Acute MI, lateral wall, initial episode of care (HCC) 03/23/2014   CAD (coronary artery disease)    a. s/p Lat MI 2/16 >> s/p CABG; b. LHC 1/17: mLAD 100, oD1 85%, oLCx 100, OM1 filled by collats from RPLB2, RPDA stent 95% ISR, L-LAD patent, S-OM1 occluded, S-D1 occluded >> PCI: Promus DES to RPDA  - residual CAD not amenable to PCI (dist vessel/diabetic appearance)    Carotid stenosis    a. Carotid US 2/19 bilat ICA 1-39%   Chronic combined systolic and diastolic CHF (congestive heart failure) (HCC)    Hyperlipidemia 10/16/2014   Hypertension    Ischemic cardiomyopathy    a. Echo at [facility] 3/16:  EF 35-40%, diff HK, mild LVH, mild LAE, mild TR;  b. Echo 7/16: mild LVH, EF 50%, inf and lat HK-AK, Gr 2 DD, mod LAE, mild RVE,  reduced RVSF, trivial TR, PASP 30 mmHg // c. Echo 5/17: moderate LVH, EF 45-50%, anteroseptal HK, grade 1 diastolic dysfunction, aortic sclerosis    Type 2 diabetes mellitus (HCC) 03/23/2014    Current Outpatient Medications  Medication Sig Dispense Refill   albuterol (VENTOLIN HFA) 108 (90 Base) MCG/ACT inhaler Inhale 1-2 puffs into the lungs every 6 (six) hours as needed for wheezing or shortness of breath.     aspirin EC 81 MG tablet Take 1 tablet (81 mg total) by mouth daily. 90 tablet 3   clopidogrel (PLAVIX) 75 MG tablet Take 1 tablet (75 mg total) by mouth daily. 90 tablet 2   Glycerin-Hypromellose-PEG 400 ([facility] DRY EYE RELIEF) 0.2-0.2-1 % SOLN Place 1 drop into both eyes daily as needed (dry eyes).     HUMALOG KWIKPEN 200 UNIT/ML KwikPen Inject 206 Units into the skin as directed. (Administered via insulin pump)      ibuprofen (ADVIL) 200 MG tablet Take 200 mg by mouth every 6 (six) hours as needed for headache or mild pain.     ipratropium-albuterol (DUONEB) 0.5-2.5 (3) MG/3ML SOLN Take 3 mLs by nebulization every 2 (two) hours as needed. (Patient not taking: Reported on 06/28/2022) 360 mL 0   isosorbide mononitrate (IMDUR) 30 MG 24 hr tablet TAKE 1 TABLET BY MOUTH EVERY DAY 90 tablet 2   KLOR-CON M20 20 MEQ tablet TAKE 1 TABLET BY MOUTH EVERY DAY 90 tablet 3   metoprolol succinate (TOPROL-XL) 100 MG 24 hr tablet TAKE 1 TABLET BY MOUTH EVERY DAY 90 tablet 3   nitroGLYCERIN (NITROSTAT) 0.4 MG SL tablet Place 1 tablet (0.4 mg total) under the tongue every 5 (five) minutes as needed for chest pain. 25 tablet 2   pantoprazole (PROTONIX) 40 MG tablet Take 40 mg by mouth daily.     prednisoLONE acetate (PRED FORTE) 1 % ophthalmic suspension Place 1 drop into the left eye 4 (four) times daily.     rosuvastatin (CRESTOR) 40 MG tablet TAKE 1 TABLET BY MOUTH EVERY DAY 90 tablet 2   sacubitril-valsartan (ENTRESTO) 49-51 MG Take 1 tablet by mouth 2 (two) times daily. 180 tablet 3   torsemide (DEMADEX) 20 MG tablet TAKE 1 TABLET BY MOUTH EVERY DAY 90 tablet 3   No current facility-administered medications for this visit.      PHYSICAL EXAM:  General:  Well appearing. No resp difficulty HEENT: normal Neck: supple. JVP flat. Carotids 2+ bilaterally; no bruits. No lymphadenopathy or thryomegaly appreciated. Cor: PMI normal. Regular rate & rhythm. No rubs, gallops or murmurs. Lungs: clear Abdomen: soft, nontender, nondistended. No hepatosplenomegaly. No bruits or masses. Good bowel sounds. Extremities: no cyanosis, clubbing, rash, edema Neuro: alert & orientedx3, cranial nerves grossly intact. Moves all 4 extremities w/o difficulty. Affect pleasant.   ECG:   ASSESSMENT & PLAN:  1: Chronic ischemia heart failure with preserved ejection fraction with structural changes (LVH)- - etiology likely CAD - NYHA  class II  - weighing daily; reminded to call for an overnight weight gain of > 2 pounds or a weekly weight gain of > 5 pounds - weight 282.2 pounds from last visit here 1 month ago - Echo 12/13/20: EF of 55-60% along with moderate LVH and trivial MR.  - Echo 06/14/22: EF 50-55% along with mild MR.  - does add salt to eggs but says "that's about it" - continue entresto 49/51mg  BID - continue torsemide to 20mg  daily - continue metoprolol succinate 100mg  daily - continue Klor-con daily -  Type 1 diabetic so not a candidate for jardiance - spironolactone caused creatinine to rise - BNP 12/12/20 was 2230    2: HTN- - BP  - saw PCP Rolan Lipa) 05/24 - BMP 07/02/22 reviewed and showed sodium 138, potassium 3.4, creatinine 1.34 & GFR 58  3: Type 1 DM- - saw endocrinology Gershon Crane) 03/24 - A1c 04/26/22 was 7.9%  4: CAD- - saw cardiology Excell Seltzer) 12/23 - continue rosuvastatin 40mg  daily - continue isosorbide MN 30mg  daily - continue clopidogrel 75mg  daily - continue ASA 81mg  daily - LHC done 12/12/20: Severe two-vessel coronary artery disease with chronic total occlusions of the mid LAD and ostial LCx, as noted on prior catheterizations. Mild, nonobstructive RCA disease with patent PDA stent. Widely patent LIMA-LAD bypass graft.  SVG-D1 and SVG-OM1 are known to be occluded and were not engaged on today's study. Mildly elevated left ventricular filling pressure (LVEDP 15-20 mmHg) with grossly normal left ventricular contraction.  5: Lymphedema- - stage 2 - worsening since recent cruise and eating high sodium foods/ drinking alcohol - unable to wear compression socks due to edema and sunburn - increasing torsemide per above

## 2022-08-13 ENCOUNTER — Encounter: Payer: Self-pay | Admitting: Family

## 2022-08-13 ENCOUNTER — Other Ambulatory Visit
Admission: RE | Admit: 2022-08-13 | Discharge: 2022-08-13 | Disposition: A | Discharge status: Home / Self Care | Payer: Medicare Other | Source: Ambulatory Visit | Attending: Family | Admitting: Family

## 2022-08-13 ENCOUNTER — Ambulatory Visit (HOSPITAL_BASED_OUTPATIENT_CLINIC_OR_DEPARTMENT_OTHER): Payer: Medicare Other | Admitting: Family

## 2022-08-13 VITALS — BP 113/61 | HR 71 | Wt 277.4 lb

## 2022-08-13 DIAGNOSIS — I25118 Atherosclerotic heart disease of native coronary artery with other forms of angina pectoris: Secondary | ICD-10-CM | POA: Diagnosis not present

## 2022-08-13 DIAGNOSIS — I5032 Chronic diastolic (congestive) heart failure: Secondary | ICD-10-CM | POA: Diagnosis present

## 2022-08-13 DIAGNOSIS — I10 Essential (primary) hypertension: Secondary | ICD-10-CM

## 2022-08-13 DIAGNOSIS — E109 Type 1 diabetes mellitus without complications: Secondary | ICD-10-CM | POA: Diagnosis not present

## 2022-08-13 DIAGNOSIS — I890 Lymphedema, not elsewhere classified: Secondary | ICD-10-CM

## 2022-08-13 LAB — BASIC METABOLIC PANEL
Anion gap: 11 (ref 5–15)
BUN: 14 mg/dL (ref 8–23)
CO2: 22 mmol/L (ref 22–32)
Calcium: 8.7 mg/dL — ABNORMAL LOW (ref 8.9–10.3)
Chloride: 104 mmol/L (ref 98–111)
Creatinine, Ser: 1.19 mg/dL (ref 0.61–1.24)
GFR, Estimated: >60 mL/min (ref >60)
Glucose, Bld: 109 mg/dL — ABNORMAL HIGH (ref 70–99)
Potassium: 3.6 mmol/L (ref 3.5–5.1)
Sodium: 137 mmol/L (ref 135–145)

## 2022-08-13 NOTE — Patient Instructions (Addendum)
Go to the [facility] and get your lab work drawn.    Was good to see you today! Be careful out in this heat.    If you receive a satisfaction survey regarding the Heart Failure Clinic, please take the time to fill it out. This way we can continue to provide excellent care and make any changes that need to be made.

## 2022-08-13 NOTE — Progress Notes (Signed)
PCP: Danny Everts, NP (last seen 05/24) Primary Cardiologist: Danny Bollman, MD (last seen 12/23)  HPI:  [name] is a 67 y/o male with a history of CAD, CABG (2016), DM2, hyperlipidemia, HTN, carotid stenosis and chronic heart failure.   Echo 06/14/22: EF 50-55% along with mild MR. Echo 12/13/20: EF of 55-60% along with moderate LVH and trivial MR.   LHC done 12/12/20 and showed: Severe two-vessel coronary artery disease with chronic total occlusions of the mid LAD and ostial LCx, as noted on prior catheterizations. Mild, nonobstructive RCA disease with patent PDA stent. Widely patent LIMA-LAD bypass graft.  SVG-D1 and SVG-OM1 are known to be occluded and were not engaged on today's study. Mildly elevated left ventricular filling pressure (LVEDP 15-20 mmHg) with grossly normal left ventricular contraction.  Hasn't been admitted or been in the ED in the last 6 months.   He presents today for a HF follow-up visit with a chief complaint of minimal SOB with moderate exertion. Chronic in nature and he reports that this is improving. Has associated fatigue and pedal edema (improving) along with this. Denies chest pain, palpitations, abdominal distention, dizziness, difficulty sleeping or weight gain.   At last visit, he was advised to increase torsemide to 40mg  daily for 3 days and then decrease it back to 20mg  but he has continued to take 40mg  daily. He is taking potassium daily as well.   ROS: All systems negative except as listed in HPI, PMH and Problem List.  SH:  Social History   Socioeconomic History   Marital status: Single    Spouse name: Not on file   Number of children: Not on file   Years of education: Not on file   Highest education level: Not on file  Occupational History   Not on file  Tobacco Use   Smoking status: Never   Smokeless tobacco: Never  Vaping Use   Vaping Use: Never used  Substance and Sexual Activity   Alcohol use: Yes   Drug use: No    Sexual activity: Not on file  Other Topics Concern   Not on file  Social History Narrative   The patient is married. He has grown children. He is a lifelong nonsmoker. He works in Production designer, theatre/television/film.   Social Determinants of Health   Financial Resource Strain: Not on file  Food Insecurity: Not on file  Transportation Needs: Not on file  Physical Activity: Not on file  Stress: Not on file  Social Connections: Not on file  Intimate Partner Violence: Not on file    FH:  Family History  Problem Relation Age of Onset   Healthy Mother        No history of CAD   Healthy Father        No history of CAD   Heart attack Father    Diabetes Brother    Stroke Neg Hx     Past Medical History:  Diagnosis Date   Acute MI, lateral wall, initial episode of care (HCC) 03/23/2014   CAD (coronary artery disease)    a. s/p Lat MI 2/16 >> s/p CABG; b. LHC 1/17: mLAD 100, oD1 85%, oLCx 100, OM1 filled by collats from RPLB2, RPDA stent 95% ISR, L-LAD patent, S-OM1 occluded, S-D1 occluded >> PCI: Promus DES to RPDA  - residual CAD not amenable to PCI (dist vessel/diabetic appearance)    Carotid stenosis    a. Carotid US 2/19 bilat ICA 1-39%   Chronic combined systolic and diastolic CHF (  congestive heart failure) (HCC)    Hyperlipidemia 10/16/2014   Hypertension    Ischemic cardiomyopathy    a. Echo at [facility] 3/16:  EF 35-40%, diff HK, mild LVH, mild LAE, mild TR;  b. Echo 7/16: mild LVH, EF 50%, inf and lat HK-AK, Gr 2 DD, mod LAE, mild RVE, reduced RVSF, trivial TR, PASP 30 mmHg // c. Echo 5/17: moderate LVH, EF 45-50%, anteroseptal HK, grade 1 diastolic dysfunction, aortic sclerosis    Type 2 diabetes mellitus (HCC) 03/23/2014    Current Outpatient Medications  Medication Sig Dispense Refill   albuterol (VENTOLIN HFA) 108 (90 Base) MCG/ACT inhaler Inhale 1-2 puffs into the lungs every 6 (six) hours as needed for wheezing or shortness of breath.     aspirin EC 81 MG tablet Take 1 tablet (81 mg total) by  mouth daily. 90 tablet 3   clopidogrel (PLAVIX) 75 MG tablet Take 1 tablet (75 mg total) by mouth daily. 90 tablet 2   Glycerin-Hypromellose-PEG 400 ([facility] DRY EYE RELIEF) 0.2-0.2-1 % SOLN Place 1 drop into both eyes daily as needed (dry eyes).     HUMALOG KWIKPEN 200 UNIT/ML KwikPen Inject 206 Units into the skin as directed. (Administered via insulin pump)     ibuprofen (ADVIL) 200 MG tablet Take 200 mg by mouth every 6 (six) hours as needed for headache or mild pain.     ipratropium-albuterol (DUONEB) 0.5-2.5 (3) MG/3ML SOLN Take 3 mLs by nebulization every 2 (two) hours as needed. (Patient not taking: Reported on 06/28/2022) 360 mL 0   isosorbide mononitrate (IMDUR) 30 MG 24 hr tablet TAKE 1 TABLET BY MOUTH EVERY DAY 90 tablet 2   KLOR-CON M20 20 MEQ tablet TAKE 1 TABLET BY MOUTH EVERY DAY 90 tablet 3   metoprolol succinate (TOPROL-XL) 100 MG 24 hr tablet TAKE 1 TABLET BY MOUTH EVERY DAY 90 tablet 3   nitroGLYCERIN (NITROSTAT) 0.4 MG SL tablet Place 1 tablet (0.4 mg total) under the tongue every 5 (five) minutes as needed for chest pain. 25 tablet 2   pantoprazole (PROTONIX) 40 MG tablet Take 40 mg by mouth daily.     prednisoLONE acetate (PRED FORTE) 1 % ophthalmic suspension Place 1 drop into the left eye 4 (four) times daily.     rosuvastatin (CRESTOR) 40 MG tablet TAKE 1 TABLET BY MOUTH EVERY DAY 90 tablet 2   sacubitril-valsartan (ENTRESTO) 49-51 MG Take 1 tablet by mouth 2 (two) times daily. 180 tablet 3   torsemide (DEMADEX) 20 MG tablet TAKE 1 TABLET BY MOUTH EVERY DAY 90 tablet 3   No current facility-administered medications for this visit.   Vitals:   08/13/22 1407  BP: 113/61  Pulse: 71  SpO2: 96%  Weight: 277 lb 6.4 oz (125.8 kg)   Wt Readings from Last 3 Encounters:  08/13/22 277 lb 6.4 oz (125.8 kg)  06/28/22 282 lb 3.2 oz (128 kg)  04/22/22 278 lb 2 oz (126.2 kg)   Lab Results  Component Value Date   CREATININE 1.34 (H) 07/02/2022   CREATININE 1.04 07/23/2021    CREATININE 1.18 03/23/2021   PHYSICAL EXAM:  General:  Well appearing. No resp difficulty HEENT: normal Neck: supple. JVP flat. No lymphadenopathy or thryomegaly appreciated. Cor: PMI normal. Regular rate & rhythm. No rubs, gallops or murmurs. Lungs: clear Abdomen: soft, nontender, nondistended. No hepatosplenomegaly. No bruits or masses.  Extremities: no cyanosis, clubbing, rash, trace pitting edema in bilateral lower legs Neuro: alert & oriented x3, cranial nerves grossly intact.  Moves all 4 extremities w/o difficulty. Affect pleasant.   ECG: not done   ASSESSMENT & PLAN:  1: Chronic ischemic heart failure with preserved ejection fraction with structural changes (LVH)- - cath shows CAD - NYHA class II - euvolemic - weighing daily; reminded to call for an overnight weight gain of > 2 pounds or a weekly weight gain of > 5 pounds - weight down 5 pounds from last visit here 6 weeks ago - Echo 12/13/20: EF of 55-60% along with moderate LVH and trivial MR.  - Echo 06/14/22: EF 50-55% along with mild MR.  - does add salt to eggs but says "that's about it" - continue entresto 49/51mg  BID - continue torsemide to 40mg  daily - BMP today - continue metoprolol succinate 100mg  daily - continue Klor-con daily - Type 1 diabetic so not a candidate for jardiance - spironolactone caused creatinine to rise - BNP 12/12/20 was 2230   2: HTN- - BP 113/61 - saw PCP Danny [name]) 05/24 - BMP 07/02/22 reviewed and showed sodium 138, potassium 3.4, creatinine 1.34 & GFR 58  3: Type 1 DM- - saw endocrinology Danny [name]) 03/24 - A1c 04/26/22 was 7.9%  4: CAD- - saw cardiology Danny [name]) 12/23 - continue rosuvastatin 40mg  daily - continue isosorbide MN 30mg  daily - continue clopidogrel 75mg  daily - continue ASA 81mg  daily - LHC done 12/12/20: Severe two-vessel coronary artery disease with chronic total occlusions of the mid LAD and ostial LCx, as noted on prior catheterizations. Mild,  nonobstructive RCA disease with patent PDA stent. Widely patent LIMA-LAD bypass graft.  SVG-D1 and SVG-OM1 are known to be occluded and were not engaged on today's study. Mildly elevated left ventricular filling pressure (LVEDP 15-20 mmHg) with grossly normal left ventricular contraction.  5: Lymphedema- - stage 2 - chronic in nature although improved - trying to walk more and elevate his legs when sitting for long periods of time - unable to wear compression socks as they cut into his legs  Return in 4 months, sooner if needed.

## 2022-10-30 ENCOUNTER — Other Ambulatory Visit: Payer: Self-pay | Admitting: Cardiovascular Disease

## 2022-12-13 NOTE — Progress Notes (Unsigned)
PCP: Laren Everts, NP (last seen 05/24) Primary Cardiologist: Tonny Bollman, MD (last seen 12/23)  HPI:  Mr Camp is a 67 y/o male with a history of CAD, CABG (2016), DM2, hyperlipidemia, HTN, carotid stenosis and chronic heart failure. Cath 02/2015 showing S-OM and S-Dx occluded, patency of the LIMA to LAD, and DES to RPDA   Hasn't been admitted or been in the ED in the last 6 months.   Echo 03/23/14: EF 50% with mild LVH, mild MR/TR Echo 08/26/14: EF 50% with mild LVH, Grade II DD, moderate LAE Echo 06/18/15: EF 45-50% with moderate LVH, trivial MR Echo 07/07/17: EF 50-55% with Grade I DD, mild LVH Echo 09/08/20: EF >55% with Grade I DD Echo 12/13/20: EF of 55-60% along with moderate LVH and trivial MR.  Echo 06/14/22: EF 50-55% along with mild MR.   LHC done 12/12/20 and showed: Severe two-vessel coronary artery disease with chronic total occlusions of the mid LAD and ostial LCx, as noted on prior catheterizations. Mild, nonobstructive RCA disease with patent PDA stent. Widely patent LIMA-LAD bypass graft.  SVG-D1 and SVG-OM1 are known to be occluded and were not engaged on today's study. Mildly elevated left ventricular filling pressure (LVEDP 15-20 mmHg) with grossly normal left ventricular contraction.  He presents today for a HF follow-up visit with a chief complaint of minimal fatigue with moderate exertion. Chronic in nature. Has associated dizziness when rushing around doing too much too quickly. Denies shortness of breath, chest pain, palpitations, pedal edema or difficulty sleeping. His biggest complaint today is of stiffness in bilateral ankles and stiffness/ pain in his right 2 fingers making it difficult to get soda bottles open. He says that these symptoms started after he was started on entresto. Did not have these issues when he started  Rosuvastatin and says that entresto was the last new medication started.   Not walking much for exercise due to left achilles tendon  inflammation. This has improved since he now stretches before getting out of the bed.   ROS: All systems negative except as listed in HPI, PMH and Problem List.  SH:  Social History   Socioeconomic History   Marital status: Single    Spouse name: Not on file   Number of children: Not on file   Years of education: Not on file   Highest education level: Not on file  Occupational History   Not on file  Tobacco Use   Smoking status: Never   Smokeless tobacco: Never  Vaping Use   Vaping status: Never Used  Substance and Sexual Activity   Alcohol use: Yes   Drug use: No   Sexual activity: Not on file  Other Topics Concern   Not on file  Social History Narrative   The patient is married. He has grown children. He is a lifelong nonsmoker. He works in Production designer, theatre/television/film.   Social Determinants of Health   Financial Resource Strain: Not on file  Food Insecurity: Not on file  Transportation Needs: Not on file  Physical Activity: Not on file  Stress: Not on file  Social Connections: Not on file  Intimate Partner Violence: Not on file    FH:  Family History  Problem Relation Age of Onset   Healthy Mother        No history of CAD   Healthy Father        No history of CAD   Heart attack Father    Diabetes Brother    Stroke Neg Hx  Past Medical History:  Diagnosis Date   Acute MI, lateral wall, initial episode of care (HCC) 03/23/2014   CAD (coronary artery disease)    a. s/p Lat MI 2/16 >> s/p CABG; b. LHC 1/17: mLAD 100, oD1 85%, oLCx 100, OM1 filled by collats from RPLB2, RPDA stent 95% ISR, L-LAD patent, S-OM1 occluded, S-D1 occluded >> PCI: Promus DES to RPDA  - residual CAD not amenable to PCI (dist vessel/diabetic appearance)    Carotid stenosis    a. Carotid US 2/19 bilat ICA 1-39%   Chronic combined systolic and diastolic CHF (congestive heart failure) (HCC)    Hyperlipidemia 10/16/2014   Hypertension    Ischemic cardiomyopathy    a. Echo at [facility] 3/16:  EF 35-40%,  diff HK, mild LVH, mild LAE, mild TR;  b. Echo 7/16: mild LVH, EF 50%, inf and lat HK-AK, Gr 2 DD, mod LAE, mild RVE, reduced RVSF, trivial TR, PASP 30 mmHg // c. Echo 5/17: moderate LVH, EF 45-50%, anteroseptal HK, grade 1 diastolic dysfunction, aortic sclerosis    Type 2 diabetes mellitus (HCC) 03/23/2014    Current Outpatient Medications  Medication Sig Dispense Refill   albuterol (VENTOLIN HFA) 108 (90 Base) MCG/ACT inhaler Inhale 1-2 puffs into the lungs every 6 (six) hours as needed for wheezing or shortness of breath.     aspirin EC 81 MG tablet Take 1 tablet (81 mg total) by mouth daily. 90 tablet 3   Cholecalciferol (VITAMIN D3) 50 MCG (2000 UT) capsule Take 5,000 Units by mouth daily.     clopidogrel (PLAVIX) 75 MG tablet Take 1 tablet (75 mg total) by mouth daily. 90 tablet 2   furosemide (LASIX) 40 MG tablet Take 40 mg by mouth 2 (two) times daily.     Glycerin-Hypromellose-PEG 400 ([facility] DRY EYE RELIEF) 0.2-0.2-1 % SOLN Place 1 drop into both eyes daily as needed (dry eyes).     HUMALOG KWIKPEN 200 UNIT/ML KwikPen Inject 206 Units into the skin as directed. (Administered via insulin pump)     ibuprofen (ADVIL) 200 MG tablet Take 200 mg by mouth every 6 (six) hours as needed for headache or mild pain.     ipratropium-albuterol (DUONEB) 0.5-2.5 (3) MG/3ML SOLN Take 3 mLs by nebulization every 2 (two) hours as needed. (Patient not taking: Reported on 06/28/2022) 360 mL 0   isosorbide mononitrate (IMDUR) 30 MG 24 hr tablet TAKE 1 TABLET BY MOUTH EVERY DAY 90 tablet 2   KLOR-CON M20 20 MEQ tablet TAKE 1 TABLET BY MOUTH EVERY DAY (Patient taking differently: Take 20 mEq by mouth 2 (two) times daily.) 90 tablet 3   metoprolol succinate (TOPROL-XL) 100 MG 24 hr tablet TAKE 1 TABLET BY MOUTH EVERY DAY 90 tablet 3   nitroGLYCERIN (NITROSTAT) 0.4 MG SL tablet Place 1 tablet (0.4 mg total) under the tongue every 5 (five) minutes as needed for chest pain. 25 tablet 2   pantoprazole (PROTONIX) 40 MG  tablet Take 40 mg by mouth daily.     prednisoLONE acetate (PRED FORTE) 1 % ophthalmic suspension Place 1 drop into the left eye 4 (four) times daily.     rosuvastatin (CRESTOR) 40 MG tablet TAKE 1 TABLET BY MOUTH EVERY DAY 90 tablet 1   sacubitril-valsartan (ENTRESTO) 49-51 MG Take 1 tablet by mouth 2 (two) times daily. 180 tablet 3   torsemide (DEMADEX) 20 MG tablet TAKE 1 TABLET BY MOUTH EVERY DAY (Patient taking differently: Take 40 mg by mouth daily.) 90 tablet 3  No current facility-administered medications for this visit.   Vitals:   12/14/22 1115  BP: (!) 141/79  Pulse: 74  SpO2: 98%  Weight: 288 lb (130.6 kg)   Wt Readings from Last 3 Encounters:  12/14/22 288 lb (130.6 kg)  08/13/22 277 lb 6.4 oz (125.8 kg)  06/28/22 282 lb 3.2 oz (128 kg)   Lab Results  Component Value Date   CREATININE 1.19 08/13/2022   CREATININE 1.34 (H) 07/02/2022   CREATININE 1.04 07/23/2021   PHYSICAL EXAM:  General:  Well appearing. No resp difficulty HEENT: normal Neck: supple. JVP flat. No lymphadenopathy or thryomegaly appreciated. Cor: PMI normal. Regular rate & rhythm. No rubs, gallops or murmurs. Lungs: clear Abdomen: soft, nontender, nondistended. No hepatosplenomegaly. No bruits or masses.  Extremities: no cyanosis, clubbing, rash, trace pitting edema in bilateral lower legs Neuro: alert & oriented x3, cranial nerves grossly intact. Moves all 4 extremities w/o difficulty. Affect pleasant.   ECG:  HR 74, NSR, no changes from previous EKG    ASSESSMENT & PLAN:  1: Chronic ischemic heart failure with preserved ejection fraction- - cath shows CAD - NYHA class II - euvolemic - weighing daily; reminded to call for an overnight weight gain of > 2 pounds or a weekly weight gain of > 5 pounds - weight up 11 pounds from last visit here 4 months ago - Echo 03/23/14: EF 50% with mild LVH, mild MR/TR - Echo 08/26/14: EF 50% with mild LVH, Grade II DD, moderate LAE - Echo 06/18/15: EF 45-50%  with moderate LVH, trivial MR - Echo 07/07/17: EF 50-55% with Grade I DD, mild LVH - Echo 09/08/20: EF >55% with Grade I DD - Echo 12/13/20: EF of 55-60% along with moderate LVH and trivial MR.  - Echo 06/14/22: EF 50-55% along with mild MR.  - does add salt to eggs but says "that's about it" - continue torsemide 40mg  daily - continue metoprolol succinate 100mg  daily - continue Klor-con BID - will pause entresto and see if joint stiffness improves; will begin valsartan 80mg  daily, explained that if symptoms improved, we would need to rechallenge with entresto and see if symptoms re-occur to know for certainty if entresto has caused it - Type 1 diabetic so not a candidate for SGLT2/ GLP1 - spironolactone caused creatinine to rise - BNP 12/12/20 was 2230   2: HTN- - BP 141/79 - saw PCP Rolan Lipa) 05/24 - BMP 08/13/22 reviewed and showed sodium 137, potassium 3.6, creatinine 1.19 & GFR >60  3: Type 1 DM- - saw endocrinology Allena Katz) 07/24 - A1c 07/15/22 was 7.9%  4: CAD- - saw cardiology Excell Seltzer) 12/23 - continue rosuvastatin 40mg  daily - continue isosorbide MN 30mg  daily - continue clopidogrel 75mg  daily - continue ASA 81mg  daily - EKG today; NSR  - LDL 07/15/22 was 48 - LHC done 12/12/20: Severe two-vessel coronary artery disease with chronic total occlusions of the mid LAD and ostial LCx, as noted on prior catheterizations. Mild, nonobstructive RCA disease with patent PDA stent. Widely patent LIMA-LAD bypass graft.  SVG-D1 and SVG-OM1 are known to be occluded and were not engaged on today's study. Mildly elevated left ventricular filling pressure (LVEDP 15-20 mmHg) with grossly normal left ventricular contraction.  5: Lymphedema- - stage 2 - chronic in nature  - was trying to walk more but then developed achilles inflammation - encouraged to elevate his legs when sitting for long periods of time - unable to wear compression socks as they cut into his legs  Return in 1 month,  sooner if needed.

## 2022-12-14 ENCOUNTER — Ambulatory Visit: Payer: Medicare Other | Attending: Family | Admitting: Family

## 2022-12-14 ENCOUNTER — Other Ambulatory Visit (HOSPITAL_COMMUNITY): Payer: Self-pay

## 2022-12-14 ENCOUNTER — Encounter: Payer: Self-pay | Admitting: Family

## 2022-12-14 VITALS — BP 141/79 | HR 74 | Wt 288.0 lb

## 2022-12-14 DIAGNOSIS — E109 Type 1 diabetes mellitus without complications: Secondary | ICD-10-CM | POA: Diagnosis not present

## 2022-12-14 DIAGNOSIS — I25118 Atherosclerotic heart disease of native coronary artery with other forms of angina pectoris: Secondary | ICD-10-CM | POA: Diagnosis not present

## 2022-12-14 DIAGNOSIS — Z951 Presence of aortocoronary bypass graft: Secondary | ICD-10-CM | POA: Diagnosis not present

## 2022-12-14 DIAGNOSIS — I5032 Chronic diastolic (congestive) heart failure: Secondary | ICD-10-CM | POA: Insufficient documentation

## 2022-12-14 DIAGNOSIS — M25572 Pain in left ankle and joints of left foot: Secondary | ICD-10-CM | POA: Insufficient documentation

## 2022-12-14 DIAGNOSIS — I890 Lymphedema, not elsewhere classified: Secondary | ICD-10-CM | POA: Diagnosis not present

## 2022-12-14 DIAGNOSIS — M79644 Pain in right finger(s): Secondary | ICD-10-CM | POA: Diagnosis not present

## 2022-12-14 DIAGNOSIS — Z7982 Long term (current) use of aspirin: Secondary | ICD-10-CM | POA: Diagnosis not present

## 2022-12-14 DIAGNOSIS — Z79899 Other long term (current) drug therapy: Secondary | ICD-10-CM | POA: Insufficient documentation

## 2022-12-14 DIAGNOSIS — E785 Hyperlipidemia, unspecified: Secondary | ICD-10-CM | POA: Insufficient documentation

## 2022-12-14 DIAGNOSIS — M25571 Pain in right ankle and joints of right foot: Secondary | ICD-10-CM | POA: Insufficient documentation

## 2022-12-14 DIAGNOSIS — Z7902 Long term (current) use of antithrombotics/antiplatelets: Secondary | ICD-10-CM | POA: Diagnosis not present

## 2022-12-14 DIAGNOSIS — Z955 Presence of coronary angioplasty implant and graft: Secondary | ICD-10-CM | POA: Insufficient documentation

## 2022-12-14 DIAGNOSIS — R5383 Other fatigue: Secondary | ICD-10-CM | POA: Diagnosis present

## 2022-12-14 DIAGNOSIS — R42 Dizziness and giddiness: Secondary | ICD-10-CM | POA: Insufficient documentation

## 2022-12-14 DIAGNOSIS — I10 Essential (primary) hypertension: Secondary | ICD-10-CM

## 2022-12-14 DIAGNOSIS — I110 Hypertensive heart disease with heart failure: Secondary | ICD-10-CM | POA: Insufficient documentation

## 2022-12-14 DIAGNOSIS — Z794 Long term (current) use of insulin: Secondary | ICD-10-CM | POA: Insufficient documentation

## 2022-12-14 MED ORDER — VALSARTAN 80 MG PO TABS: 80.0000 mg | ORAL_TABLET | Freq: Every day | ORAL | 5 refills | Status: DC

## 2022-12-14 NOTE — Patient Instructions (Addendum)
STOP TAKING ENTRESTO  START VALSARTAN 80 MG ONCE DAILY

## 2022-12-15 NOTE — Addendum Note (Signed)
Addended by: Noralee Space on: 12/15/2022 10:44 AM   Modules accepted: Orders

## 2022-12-21 ENCOUNTER — Other Ambulatory Visit: Payer: Self-pay | Admitting: Cardiovascular Disease

## 2022-12-21 DIAGNOSIS — I5042 Chronic combined systolic (congestive) and diastolic (congestive) heart failure: Secondary | ICD-10-CM

## 2022-12-21 DIAGNOSIS — I25118 Atherosclerotic heart disease of native coronary artery with other forms of angina pectoris: Secondary | ICD-10-CM

## 2023-01-04 ENCOUNTER — Encounter: Payer: Self-pay | Admitting: Family

## 2023-01-04 ENCOUNTER — Ambulatory Visit (HOSPITAL_BASED_OUTPATIENT_CLINIC_OR_DEPARTMENT_OTHER): Payer: Medicare Other | Admitting: Family

## 2023-01-04 ENCOUNTER — Telehealth: Payer: Self-pay | Admitting: *Deleted

## 2023-01-04 ENCOUNTER — Other Ambulatory Visit: Payer: Self-pay

## 2023-01-04 ENCOUNTER — Other Ambulatory Visit
Admission: RE | Admit: 2023-01-04 | Discharge: 2023-01-04 | Disposition: A | Discharge status: Home / Self Care | Payer: Medicare Other | Source: Ambulatory Visit | Attending: Family | Admitting: Family

## 2023-01-04 VITALS — BP 142/87 | HR 77 | Wt 291.0 lb

## 2023-01-04 DIAGNOSIS — I25118 Atherosclerotic heart disease of native coronary artery with other forms of angina pectoris: Secondary | ICD-10-CM | POA: Diagnosis not present

## 2023-01-04 DIAGNOSIS — E109 Type 1 diabetes mellitus without complications: Secondary | ICD-10-CM | POA: Diagnosis not present

## 2023-01-04 DIAGNOSIS — I5032 Chronic diastolic (congestive) heart failure: Secondary | ICD-10-CM

## 2023-01-04 DIAGNOSIS — I890 Lymphedema, not elsewhere classified: Secondary | ICD-10-CM

## 2023-01-04 DIAGNOSIS — I10 Essential (primary) hypertension: Secondary | ICD-10-CM | POA: Diagnosis not present

## 2023-01-04 LAB — BASIC METABOLIC PANEL
Anion gap: 9 (ref 5–15)
BUN: 19 mg/dL (ref 8–23)
CO2: 27 mmol/L (ref 22–32)
Calcium: 9.3 mg/dL (ref 8.9–10.3)
Chloride: 106 mmol/L (ref 98–111)
Creatinine, Ser: 1.07 mg/dL (ref 0.61–1.24)
GFR, Estimated: >60 mL/min (ref >60)
Glucose, Bld: 74 mg/dL (ref 70–99)
Potassium: 4.2 mmol/L (ref 3.5–5.1)
Sodium: 142 mmol/L (ref 135–145)

## 2023-01-04 MED ORDER — VALSARTAN 160 MG PO TABS: 160.0000 mg | ORAL_TABLET | Freq: Every day | ORAL | 3 refills | Status: DC

## 2023-01-04 NOTE — Progress Notes (Signed)
PCP: Laren Everts, NP (last seen 05/24) Primary Cardiologist: Tonny Bollman, MD (last seen 12/23)  HPI:  Danny [name] is a 67 y/o male with a history of CAD, CABG (2016), DM2, hyperlipidemia, HTN, carotid stenosis and chronic heart failure. Cath 02/2015 showing S-OM and S-Dx occluded, patency of the LIMA to LAD, and DES to RPDA   Hasn't been admitted or been in the ED in the last 6 months.   Echo 03/23/14: EF 50% with mild LVH, mild Danny/TR Echo 08/26/14: EF 50% with mild LVH, Grade II DD, moderate LAE Echo 06/18/15: EF 45-50% with moderate LVH, trivial Danny Echo 07/07/17: EF 50-55% with Grade I DD, mild LVH Echo 09/08/20: EF >55% with Grade I DD Echo 12/13/20: EF of 55-60% along with moderate LVH and trivial Danny.  Echo 06/14/22: EF 50-55% along with mild Danny.   LHC done 12/12/20 and showed: Severe two-vessel coronary artery disease with chronic total occlusions of the mid LAD and ostial LCx, as noted on prior catheterizations. Mild, nonobstructive RCA disease with patent PDA stent. Widely patent LIMA-LAD bypass graft.  SVG-D1 and SVG-OM1 are known to be occluded and were not engaged on today's study. Mildly elevated left ventricular filling pressure (LVEDP 15-20 mmHg) with grossly normal left ventricular contraction.  He presents today for a HF follow-up visit with a chief complaint of minimal fatigue with moderate exertion. Chronic in nature. Has associated intermittent pedal edema along with this. Sleeping well on 1 pillow. Denies shortness of breath, chest pain, cough, palpitations, abdominal distention or dizziness.    At last visit, entresto was changed to valsartan due to joint stiffness. He says that his joint stiffness has completely resolved since stopping entresto. Endocrinology is working on a PA for [employer].   ROS: All systems negative except as listed in HPI, PMH and Problem List.  SH:  Social History   Socioeconomic History   Marital status: Single    Spouse name: Not on file    Number of children: Not on file   Years of education: Not on file   Highest education level: Not on file  Occupational History   Not on file  Tobacco Use   Smoking status: Never   Smokeless tobacco: Never  Vaping Use   Vaping status: Never Used  Substance and Sexual Activity   Alcohol use: Yes   Drug use: No   Sexual activity: Not on file  Other Topics Concern   Not on file  Social History Narrative   The patient is married. He has grown children. He is a lifelong nonsmoker. He works in Production designer, theatre/television/film.   Social Determinants of Health   Financial Resource Strain: Not on file  Food Insecurity: Not on file  Transportation Needs: Not on file  Physical Activity: Not on file  Stress: Not on file  Social Connections: Not on file  Intimate Partner Violence: Not on file    FH:  Family History  Problem Relation Age of Onset   Healthy Mother        No history of CAD   Healthy Father        No history of CAD   Heart attack Father    Diabetes Brother    Stroke Neg Hx     Past Medical History:  Diagnosis Date   Acute MI, lateral wall, initial episode of care (HCC) 03/23/2014   CAD (coronary artery disease)    a. s/p Lat MI 2/16 >> s/p CABG; b. LHC 1/17: mLAD 100, oD1 85%, oLCx  100, OM1 filled by collats from RPLB2, RPDA stent 95% ISR, L-LAD patent, S-OM1 occluded, S-D1 occluded >> PCI: Promus DES to RPDA  - residual CAD not amenable to PCI (dist vessel/diabetic appearance)    Carotid stenosis    a. Carotid US 2/19 bilat ICA 1-39%   Chronic combined systolic and diastolic CHF (congestive heart failure) (HCC)    Hyperlipidemia 10/16/2014   Hypertension    Ischemic cardiomyopathy    a. Echo at [id number] 3/16:  EF 35-40%, diff HK, mild LVH, mild LAE, mild TR;  b. Echo 7/16: mild LVH, EF 50%, inf and lat HK-AK, Gr 2 DD, mod LAE, mild RVE, reduced RVSF, trivial TR, PASP 30 mmHg // c. Echo 5/17: moderate LVH, EF 45-50%, anteroseptal HK, grade 1 diastolic dysfunction, aortic sclerosis    Type  2 diabetes mellitus (HCC) 03/23/2014    Current Outpatient Medications  Medication Sig Dispense Refill   albuterol (VENTOLIN HFA) 108 (90 Base) MCG/ACT inhaler Inhale 1-2 puffs into the lungs every 6 (six) hours as needed for wheezing or shortness of breath.     aspirin EC 81 MG tablet Take 1 tablet (81 mg total) by mouth daily. 90 tablet 3   Cholecalciferol (VITAMIN D3) 50 MCG (2000 UT) capsule Take 5,000 Units by mouth daily.     clopidogrel (PLAVIX) 75 MG tablet Take 1 tablet (75 mg total) by mouth daily. 90 tablet 2   furosemide (LASIX) 40 MG tablet Take 40 mg by mouth 2 (two) times daily.     Glycerin-Hypromellose-PEG 400 ([facility] DRY EYE RELIEF) 0.2-0.2-1 % SOLN Place 1 drop into both eyes daily as needed (dry eyes).     HUMALOG KWIKPEN 200 UNIT/ML KwikPen Inject 206 Units into the skin as directed. (Administered via insulin pump)     ibuprofen (ADVIL) 200 MG tablet Take 200 mg by mouth every 6 (six) hours as needed for headache or mild pain.     ipratropium-albuterol (DUONEB) 0.5-2.5 (3) MG/3ML SOLN Take 3 mLs by nebulization every 2 (two) hours as needed. 360 mL 0   isosorbide mononitrate (IMDUR) 30 MG 24 hr tablet TAKE 1 TABLET BY MOUTH EVERY DAY 90 tablet 2   KLOR-CON M20 20 MEQ tablet TAKE 1 TABLET BY MOUTH EVERY DAY (Patient taking differently: Take 20 mEq by mouth 2 (two) times daily.) 90 tablet 3   metoprolol succinate (TOPROL-XL) 100 MG 24 hr tablet TAKE 1 TABLET BY MOUTH EVERY DAY 90 tablet 3   nitroGLYCERIN (NITROSTAT) 0.4 MG SL tablet Place 1 tablet (0.4 mg total) under the tongue every 5 (five) minutes as needed for chest pain. 25 tablet 2   pantoprazole (PROTONIX) 40 MG tablet Take 40 mg by mouth daily.     prednisoLONE acetate (PRED FORTE) 1 % ophthalmic suspension Place 1 drop into the left eye 4 (four) times daily.     rosuvastatin (CRESTOR) 40 MG tablet TAKE 1 TABLET BY MOUTH EVERY DAY 90 tablet 1   torsemide (DEMADEX) 20 MG tablet Take 40 mg by mouth daily.     valsartan  (DIOVAN) 80 MG tablet Take 1 tablet (80 mg total) by mouth daily. 30 tablet 5   No current facility-administered medications for this visit.   Vitals:   01/04/23 1101  BP: (!) 142/87  Pulse: 77  SpO2: 96%  Weight: 291 lb (132 kg)   Wt Readings from Last 3 Encounters:  01/04/23 291 lb (132 kg)  12/14/22 288 lb (130.6 kg)  08/13/22 277 lb 6.4 oz (125.8 kg)  Lab Results  Component Value Date   CREATININE 1.19 08/13/2022   CREATININE 1.34 (H) 07/02/2022   CREATININE 1.04 07/23/2021     PHYSICAL EXAM:  General:  Well appearing. No resp difficulty HEENT: normal Neck: supple. JVP flat. No lymphadenopathy or thryomegaly appreciated. Cor: PMI normal. Regular rate & rhythm. No rubs, gallops or murmurs. Lungs: clear Abdomen: soft, nontender, nondistended. No hepatosplenomegaly. No bruits or masses.  Extremities: no cyanosis, clubbing, rash, trace pitting edema in bilateral lower legs Neuro: alert & oriented x3, cranial nerves grossly intact. Moves all 4 extremities w/o difficulty. Affect pleasant.   ECG:  not done    ASSESSMENT & PLAN:  1: Chronic ischemic heart failure with preserved ejection fraction- - cath shows CAD - NYHA class II - euvolemic - weighing daily; reminded to call for an overnight weight gain of > 2 pounds or a weekly weight gain of > 5 pounds - weight up 3 pounds from last visit here 3 weeks ago - Echo 03/23/14: EF 50% with mild LVH, mild Danny/TR - Echo 08/26/14: EF 50% with mild LVH, Grade II DD, moderate LAE - Echo 06/18/15: EF 45-50% with moderate LVH, trivial Danny - Echo 07/07/17: EF 50-55% with Grade I DD, mild LVH - Echo 09/08/20: EF >55% with Grade I DD - Echo 12/13/20: EF of 55-60% along with moderate LVH and trivial Danny.  - Echo 06/14/22: EF 50-55% along with mild Danny.  - does add salt to eggs but says "that's about it" - continue torsemide 40mg  daily - continue metoprolol succinate 100mg  daily - continue Klor-con BID - continue valsartan 80mg   daily - BMET today and possibly titrate valsartan to 160mg  for BP control  - Type 1 diabetic so not a candidate for SGLT2 - spironolactone caused creatinine to rise - BNP 12/12/20 was 2230   2: HTN- - BP 142/87 - saw PCP Rolan Lipa) 05/24 - BMP 08/13/22 reviewed and showed sodium 137, potassium 3.6, creatinine 1.19 & GFR >60 - BMET today  3: Type 1 DM- - saw endocrinology Gershon Crane) 11/24 - A1c 01/03/23 was 7.3% - wegovy 0.25mg  weekly to be started if approved; PA done via endocrinology office  4: CAD- - saw cardiology Excell Seltzer) 12/23 - continue rosuvastatin 40mg  daily - continue isosorbide MN 30mg  daily - continue clopidogrel 75mg  daily - continue ASA 81mg  daily - EKG today; NSR  - LDL 07/15/22 was 48 - LHC done 12/12/20: Severe two-vessel coronary artery disease with chronic total occlusions of the mid LAD and ostial LCx, as noted on prior catheterizations. Mild, nonobstructive RCA disease with patent PDA stent. Widely patent LIMA-LAD bypass graft.  SVG-D1 and SVG-OM1 are known to be occluded and were not engaged on today's study. Mildly elevated left ventricular filling pressure (LVEDP 15-20 mmHg) with grossly normal left ventricular contraction.  5: Lymphedema- - stage 2 - chronic in nature  - trying to walk more  - encouraged to elevate his legs when sitting for long periods of time - unable to wear compression socks as they cut into his legs - discussed getting a larger size of compression socks and trying the zip up socks to make it easier for him to get them on  Return in 1 month, sooner if needed.

## 2023-01-04 NOTE — Patient Instructions (Signed)
Routine lab work today. Will notify you of abnormal results  Follow up in 1 month  Do the following things EVERYDAY: Weigh yourself in the morning before breakfast. Write it down and keep it in a log. Take your medicines as prescribed Eat low salt foods--Limit salt (sodium) to 2000 mg per day.  Stay as active as you can everyday Limit all fluids for the day to less than 2 liters

## 2023-01-04 NOTE — Telephone Encounter (Signed)
error 

## 2023-02-02 ENCOUNTER — Telehealth: Payer: Self-pay | Admitting: Family

## 2023-02-02 NOTE — Progress Notes (Unsigned)
PCP: Laren Everts, NP (last seen 05/24) Primary Cardiologist: Tonny Bollman, MD (last seen 12/23)  HPI:  Danny [name] is a 67 y/o male with a history of CAD, CABG (2016), DM2, hyperlipidemia, HTN, carotid stenosis and chronic heart failure. Cath 02/2015 showing S-OM and S-Dx occluded, patency of the LIMA to LAD, and DES to RPDA   Hasn't been admitted or been in the ED in the last 6 months.   Echo 12/13/20: EF of 55-60% along with moderate LVH and trivial Danny.  Echo 06/14/22: EF 50-55% along with mild Danny.   He presents today for a HF follow-up visit with a chief complaint of minimal fatigue. Has associated dizziness with sudden position changes & pedal edema along with this. Reports sleeping ok on 2 pillows. Is now taking wegovy & changing his diet. No issues with it that he knows of. Denies chest pain, cough, palpitations, abdominal distention or weight gain.   Since last visit, his valsartan has been increased to 160mg  daily. Denies any issues with increased dose.   Previous cardiac studies:  Echo 03/23/14: EF 50% with mild LVH, mild Danny/TR Echo 08/26/14: EF 50% with mild LVH, Grade II DD, moderate LAE Echo 06/18/15: EF 45-50% with moderate LVH, trivial Danny Echo 07/07/17: EF 50-55% with Grade I DD, mild LVH Echo 09/08/20: EF >55% with Grade I DD  LHC done 12/12/20 and showed: Severe two-vessel coronary artery disease with chronic total occlusions of the mid LAD and ostial LCx, as noted on prior catheterizations. Mild, nonobstructive RCA disease with patent PDA stent. Widely patent LIMA-LAD bypass graft.  SVG-D1 and SVG-OM1 are known to be occluded and were not engaged on today's study. Mildly elevated left ventricular filling pressure (LVEDP 15-20 mmHg) with grossly normal left ventricular contraction.  ROS: All systems negative except as listed in HPI, PMH and Problem List.  SH:  Social History   Socioeconomic History   Marital status: Single    Spouse name: Not on file   Number of  children: Not on file   Years of education: Not on file   Highest education level: Not on file  Occupational History   Not on file  Tobacco Use   Smoking status: Never   Smokeless tobacco: Never  Vaping Use   Vaping status: Never Used  Substance and Sexual Activity   Alcohol use: Yes   Drug use: No   Sexual activity: Not on file  Other Topics Concern   Not on file  Social History Narrative   The patient is married. He has grown children. He is a lifelong nonsmoker. He works in Production designer, theatre/television/film.   Social Drivers of Corporate investment banker Strain: Low Risk  (02/02/2023)   Received [facility]   Overall Financial Resource Strain (CARDIA)    Difficulty of Paying Living Expenses: Not hard at all  Food Insecurity: Food Insecurity Present (02/02/2023)   Received from [facility]   Hunger Vital Sign    Worried About Running Out of Food in the Last Year: Sometimes true    Ran Out of Food in the Last Year: Never true  Transportation Needs: No Transportation Needs (02/02/2023)   Received from [facility]    In the past 12 months, has lack of transportation kept you from medical appointments or from getting medications?: No    Lack of Transportation (Non-Medical): No  Physical Activity: Not on file  Stress: Not on file  Social  Connections: Not on file  Intimate Partner Violence: Not on file    FH:  Family History  Problem Relation Age of Onset   Healthy Mother        No history of CAD   Healthy Father        No history of CAD   Heart attack Father    Diabetes Brother    Stroke Neg Hx     Past Medical History:  Diagnosis Date   Acute MI, lateral wall, initial episode of care (HCC) 03/23/2014   CAD (coronary artery disease)    a. s/p Lat MI 2/16 >> s/p CABG; b. LHC 1/17: mLAD 100, oD1 85%, oLCx 100, OM1 filled by collats from RPLB2, RPDA stent 95% ISR, L-LAD patent, S-OM1 occluded, S-D1  occluded >> PCI: Promus DES to RPDA  - residual CAD not amenable to PCI (dist vessel/diabetic appearance)    Carotid stenosis    a. Carotid US 2/19 bilat ICA 1-39%   Chronic combined systolic and diastolic CHF (congestive heart failure) (HCC)    Hyperlipidemia 10/16/2014   Hypertension    Ischemic cardiomyopathy    a. Echo at [facility] 3/16:  EF 35-40%, diff HK, mild LVH, mild LAE, mild TR;  b. Echo 7/16: mild LVH, EF 50%, inf and lat HK-AK, Gr 2 DD, mod LAE, mild RVE, reduced RVSF, trivial TR, PASP 30 mmHg // c. Echo 5/17: moderate LVH, EF 45-50%, anteroseptal HK, grade 1 diastolic dysfunction, aortic sclerosis    Type 2 diabetes mellitus (HCC) 03/23/2014    Current Outpatient Medications  Medication Sig Dispense Refill   albuterol (VENTOLIN HFA) 108 (90 Base) MCG/ACT inhaler Inhale 1-2 puffs into the lungs every 6 (six) hours as needed for wheezing or shortness of breath.     aspirin EC 81 MG tablet Take 1 tablet (81 mg total) by mouth daily. 90 tablet 3   Cholecalciferol (VITAMIN D3) 50 MCG (2000 UT) capsule Take 5,000 Units by mouth daily.     clopidogrel (PLAVIX) 75 MG tablet Take 1 tablet (75 mg total) by mouth daily. 90 tablet 2   furosemide (LASIX) 40 MG tablet Take 40 mg by mouth 2 (two) times daily.     Glycerin-Hypromellose-PEG 400 ([facility] DRY EYE RELIEF) 0.2-0.2-1 % SOLN Place 1 drop into both eyes daily as needed (dry eyes).     HUMALOG KWIKPEN 200 UNIT/ML KwikPen Inject 206 Units into the skin as directed. (Administered via insulin pump)     ibuprofen (ADVIL) 200 MG tablet Take 200 mg by mouth every 6 (six) hours as needed for headache or mild pain.     ipratropium-albuterol (DUONEB) 0.5-2.5 (3) MG/3ML SOLN Take 3 mLs by nebulization every 2 (two) hours as needed. 360 mL 0   isosorbide mononitrate (IMDUR) 30 MG 24 hr tablet TAKE 1 TABLET BY MOUTH EVERY DAY 90 tablet 2   KLOR-CON M20 20 MEQ tablet TAKE 1 TABLET BY MOUTH EVERY DAY (Patient taking differently: Take 20 mEq by mouth 2 (two)  times daily.) 90 tablet 3   metoprolol succinate (TOPROL-XL) 100 MG 24 hr tablet TAKE 1 TABLET BY MOUTH EVERY DAY 90 tablet 3   nitroGLYCERIN (NITROSTAT) 0.4 MG SL tablet Place 1 tablet (0.4 mg total) under the tongue every 5 (five) minutes as needed for chest pain. 25 tablet 2   pantoprazole (PROTONIX) 40 MG tablet Take 40 mg by mouth daily.     prednisoLONE acetate (PRED FORTE) 1 % ophthalmic suspension Place 1 drop into the left eye  4 (four) times daily.     rosuvastatin (CRESTOR) 40 MG tablet TAKE 1 TABLET BY MOUTH EVERY DAY 90 tablet 1   Semaglutide-Weight Management (WEGOVY) 0.25 MG/0.5ML SOAJ Inject 0.25 mg into the skin.     torsemide (DEMADEX) 20 MG tablet Take 40 mg by mouth daily.     valsartan (DIOVAN) 160 MG tablet Take 1 tablet (160 mg total) by mouth daily. 90 tablet 3   No current facility-administered medications for this visit.   Vitals:   02/03/23 1058  BP: 131/76  Pulse: 74  SpO2: 96%  Weight: 287 lb (130.2 kg)  Height: 5\' 8"  (1.727 m)   Wt Readings from Last 3 Encounters:  02/03/23 287 lb (130.2 kg)  01/04/23 291 lb (132 kg)  12/14/22 288 lb (130.6 kg)   Lab Results  Component Value Date   CREATININE 1.07 01/04/2023   CREATININE 1.19 08/13/2022   CREATININE 1.34 (H) 07/02/2022    PHYSICAL EXAM:  General:  Well appearing. No resp difficulty HEENT: normal Neck: supple. JVP flat. No lymphadenopathy or thryomegaly appreciated. Cor: PMI normal. Regular rate & rhythm. No rubs, gallops or murmurs. Lungs: clear Abdomen: soft, nontender, nondistended. No hepatosplenomegaly. No bruits or masses.  Extremities: no cyanosis, clubbing, rash, trace pitting edema in bilateral lower legs Neuro: alert & oriented x3, cranial nerves grossly intact. Moves all 4 extremities w/o difficulty. Affect pleasant.   ECG:  not done    ASSESSMENT & PLAN:  1: Chronic ischemic heart failure with preserved ejection fraction- - cath shows CAD - NYHA class II - euvolemic -  weighing daily; reminded to call for an overnight weight gain of > 2 pounds or a weekly weight gain of > 5 pounds - weight down 4 pounds from last visit here 1 month ago - Echo 03/23/14: EF 50% with mild LVH, mild Danny/TR - Echo 08/26/14: EF 50% with mild LVH, Grade II DD, moderate LAE - Echo 06/18/15: EF 45-50% with moderate LVH, trivial Danny - Echo 07/07/17: EF 50-55% with Grade I DD, mild LVH - Echo 09/08/20: EF >55% with Grade I DD - Echo 12/13/20: EF of 55-60% along with moderate LVH and trivial Danny.  - Echo 06/14/22: EF 50-55% along with mild Danny.  - does add salt to eggs but says "that's about it" - continue torsemide 40mg  daily - continue metoprolol succinate 100mg  daily - continue Klor-con QD - continue valsartan 160mg  daily - BMET today - Type 1 diabetic so not a candidate for SGLT2 - spironolactone caused creatinine to rise - BNP 12/12/20 was 2230   2: HTN- - BP 131/76 - saw PCP Danny [name]) 05/24 - BMP 01/04/23 reviewed and showed sodium 142, potassium 4.2, creatinine 1.07 & GFR >60 - BMET today  3: Type 1 DM- - saw endocrinology Danny [name]) 11/24 - A1c 01/03/23 was 7.3% - now taking wegovy - home glucose running 120-150's  4: CAD- - saw cardiology Danny [name]) 12/23; encouraged him to call his office and get f/u appointment scheduled - continue rosuvastatin 40mg  daily - resume isosorbide MN 30mg  daily (this was refilled today) - continue clopidogrel 75mg  daily - continue ASA 81mg  daily - LDL 07/15/22 was 48 - LHC done 12/12/20: Severe two-vessel coronary artery disease with chronic total occlusions of the mid LAD and ostial LCx, as noted on prior catheterizations. Mild, nonobstructive RCA disease with patent PDA stent. Widely patent LIMA-LAD bypass graft.  SVG-D1 and SVG-OM1 are known to be occluded and were not engaged on today's study. Mildly elevated left  ventricular filling pressure (LVEDP 15-20 mmHg) with grossly normal left ventricular contraction.  5: Lymphedema- - stage  2 - chronic in nature  - trying to walk more  - elevating legs when sitting for long periods of time - now wearing zippered compression socks with slight improvement  Return in 6 months, sooner if needed.

## 2023-02-02 NOTE — Telephone Encounter (Signed)
Pt unreachable couldn't lvm

## 2023-02-03 ENCOUNTER — Ambulatory Visit: Payer: Medicare Other | Attending: Family | Admitting: Family

## 2023-02-03 ENCOUNTER — Encounter: Payer: Self-pay | Admitting: Family

## 2023-02-03 VITALS — BP 131/76 | HR 74 | Ht 68.0 in | Wt 287.0 lb

## 2023-02-03 DIAGNOSIS — Z955 Presence of coronary angioplasty implant and graft: Secondary | ICD-10-CM | POA: Diagnosis not present

## 2023-02-03 DIAGNOSIS — E785 Hyperlipidemia, unspecified: Secondary | ICD-10-CM | POA: Diagnosis not present

## 2023-02-03 DIAGNOSIS — R42 Dizziness and giddiness: Secondary | ICD-10-CM | POA: Insufficient documentation

## 2023-02-03 DIAGNOSIS — I25118 Atherosclerotic heart disease of native coronary artery with other forms of angina pectoris: Secondary | ICD-10-CM | POA: Diagnosis not present

## 2023-02-03 DIAGNOSIS — I5032 Chronic diastolic (congestive) heart failure: Secondary | ICD-10-CM | POA: Diagnosis not present

## 2023-02-03 DIAGNOSIS — Z79899 Other long term (current) drug therapy: Secondary | ICD-10-CM | POA: Insufficient documentation

## 2023-02-03 DIAGNOSIS — I2510 Atherosclerotic heart disease of native coronary artery without angina pectoris: Secondary | ICD-10-CM | POA: Diagnosis not present

## 2023-02-03 DIAGNOSIS — E109 Type 1 diabetes mellitus without complications: Secondary | ICD-10-CM | POA: Insufficient documentation

## 2023-02-03 DIAGNOSIS — I255 Ischemic cardiomyopathy: Secondary | ICD-10-CM | POA: Insufficient documentation

## 2023-02-03 DIAGNOSIS — I890 Lymphedema, not elsewhere classified: Secondary | ICD-10-CM | POA: Diagnosis not present

## 2023-02-03 DIAGNOSIS — Z951 Presence of aortocoronary bypass graft: Secondary | ICD-10-CM | POA: Insufficient documentation

## 2023-02-03 DIAGNOSIS — Z7982 Long term (current) use of aspirin: Secondary | ICD-10-CM | POA: Insufficient documentation

## 2023-02-03 DIAGNOSIS — Z7902 Long term (current) use of antithrombotics/antiplatelets: Secondary | ICD-10-CM | POA: Insufficient documentation

## 2023-02-03 DIAGNOSIS — I10 Essential (primary) hypertension: Secondary | ICD-10-CM

## 2023-02-03 DIAGNOSIS — Z794 Long term (current) use of insulin: Secondary | ICD-10-CM | POA: Diagnosis not present

## 2023-02-03 DIAGNOSIS — R5383 Other fatigue: Secondary | ICD-10-CM | POA: Insufficient documentation

## 2023-02-03 DIAGNOSIS — I110 Hypertensive heart disease with heart failure: Secondary | ICD-10-CM | POA: Diagnosis not present

## 2023-02-03 MED ORDER — POTASSIUM CHLORIDE CRYS ER 20 MEQ PO TBCR: 40.0000 meq | EXTENDED_RELEASE_TABLET | Freq: Every day | ORAL | 11 refills | Status: AC

## 2023-02-03 MED ORDER — TORSEMIDE 20 MG PO TABS: 40.0000 mg | ORAL_TABLET | Freq: Every day | ORAL | 11 refills | Status: DC

## 2023-02-03 MED ORDER — ISOSORBIDE MONONITRATE ER 30 MG PO TB24: 30.0000 mg | ORAL_TABLET | Freq: Every day | ORAL | 2 refills | Status: DC

## 2023-02-03 NOTE — Patient Instructions (Signed)
 Go DOWN to LOWER LEVEL (LL) to have your blood work completed inside of [employer] office.  We will only call you if the results are abnormal or if the provider would like to make medication changes.

## 2023-02-04 ENCOUNTER — Encounter: Payer: Self-pay | Admitting: Family

## 2023-02-04 DIAGNOSIS — I5032 Chronic diastolic (congestive) heart failure: Secondary | ICD-10-CM

## 2023-02-04 LAB — BASIC METABOLIC PANEL
BUN/Creatinine Ratio: 13 (ref 10–24)
BUN: 17 mg/dL (ref 8–27)
CO2: 23 mmol/L (ref 20–29)
Calcium: 9.4 mg/dL (ref 8.6–10.2)
Chloride: 104 mmol/L (ref 96–106)
Creatinine, Ser: 1.32 mg/dL — ABNORMAL HIGH (ref 0.76–1.27)
Glucose: 81 mg/dL (ref 70–99)
Potassium: 4.4 mmol/L (ref 3.5–5.2)
Sodium: 144 mmol/L (ref 134–144)
eGFR: 59 mL/min/{1.73_m2} — ABNORMAL LOW (ref >59)

## 2023-02-04 NOTE — Telephone Encounter (Signed)
-----   Message from Delma Freeze sent at 02/04/2023  6:50 AM EST ----- Please place BMET order for 2 weeks. I sent him a mychart message about this. Thanks

## 2023-02-27 ENCOUNTER — Other Ambulatory Visit: Payer: Self-pay | Admitting: Family

## 2023-03-20 ENCOUNTER — Other Ambulatory Visit: Payer: Self-pay | Admitting: Cardiovascular Disease

## 2023-04-17 ENCOUNTER — Other Ambulatory Visit: Payer: Self-pay | Admitting: Cardiovascular Disease

## 2023-06-18 ENCOUNTER — Other Ambulatory Visit: Payer: Self-pay | Admitting: Cardiovascular Disease

## 2023-07-15 ENCOUNTER — Other Ambulatory Visit: Payer: Self-pay | Admitting: Cardiovascular Disease

## 2023-07-15 NOTE — Addendum Note (Signed)
 Addended by: Debrah Fan, Davona Kinoshita L on: 07/15/2023 12:40 PM   Modules accepted: Orders

## 2023-07-27 ENCOUNTER — Other Ambulatory Visit: Payer: Self-pay | Admitting: Family

## 2023-08-04 ENCOUNTER — Encounter: Payer: Medicare Other | Admitting: Family

## 2023-08-11 ENCOUNTER — Telehealth: Payer: Self-pay | Admitting: Family

## 2023-08-11 NOTE — Progress Notes (Signed)
 [facility] Note    PCP: Steva Kirsch, NP  Primary Cardiologist: Wonda Sharper, MD   Chief Complaint: shortness of breath   HPI:  Danny [name] is a 68 y/o male with a history of CAD, CABG (2016), DM2, hyperlipidemia, HTN, carotid stenosis and chronic heart failure. Cath 02/2015 showing S-OM and S-Dx occluded, patency of the LIMA to LAD, and DES to RPDA   Hasn't been admitted or been in the ED in the last 6 months.   Echo 12/13/20: EF of 55-60% along with moderate LVH and trivial Danny.  Echo 06/14/22: EF 50-55% along with mild Danny.   He presents today for a HF follow-up visit with a chief complaint of minimal shortness of breath. Has associated fatigue. Denies chest pain, palpitations, abdominal distention, pedal edema, dizziness or difficulty sleeping. Wearing compression socks daily. Insurance will no longer cover GLP1.   In reviewing medication bottles, he doesn't have metoprolol  and says that he's not sure how long he's been without it.    Previous cardiac studies:  Echo 03/23/14: EF 50% with mild LVH, mild Danny/TR Echo 08/26/14: EF 50% with mild LVH, Grade II DD, moderate LAE Echo 06/18/15: EF 45-50% with moderate LVH, trivial Danny Echo 07/07/17: EF 50-55% with Grade I DD, mild LVH Echo 09/08/20: EF >55% with Grade I DD  LHC done 12/12/20 and showed: Severe two-vessel coronary artery disease with chronic total occlusions of the mid LAD and ostial LCx, as noted on prior catheterizations. Mild, nonobstructive RCA disease with patent PDA stent. Widely patent LIMA-LAD bypass graft.  SVG-D1 and SVG-OM1 are known to be occluded and were not engaged on today's study. Mildly elevated left ventricular filling pressure (LVEDP 15-20 mmHg) with grossly normal left ventricular contraction.  ROS: All systems negative except as listed in HPI, PMH and Problem List.  SH:  Social History   Socioeconomic History   Marital status: Single    Spouse name: Not on file   Number of  children: Not on file   Years of education: Not on file   Highest education level: Not on file  Occupational History   Not on file  Tobacco Use   Smoking status: Never   Smokeless tobacco: Never  Vaping Use   Vaping status: Never Used  Substance and Sexual Activity   Alcohol use: Yes   Drug use: No   Sexual activity: Not on file  Other Topics Concern   Not on file  Social History Narrative   The patient is married. He has grown children. He is a lifelong nonsmoker. He works in Production designer, theatre/television/film.   Social Drivers of Corporate investment banker Strain: Low Risk  (02/02/2023)   Received from [facility]   Overall Financial Resource Strain (CARDIA)    Difficulty of Paying Living Expenses: Not hard at all  Food Insecurity: Food Insecurity Present (02/02/2023)   Received from [facility]   Hunger Vital Sign    Within the past 12 months, you worried that your food would run out before you got the money to buy more.: Sometimes true    Within the past 12 months, the food you bought just didn't last and you didn't have money to get more.: Never true  Transportation Needs: No Transportation Needs (02/02/2023)   Received from [facility] - Transportation    In the past 12 months, has lack of transportation kept you from medical appointments or from getting medications?: No  Lack of Transportation (Non-Medical): No  Physical Activity: Not on file  Stress: Not on file  Social Connections: Not on file  Intimate Partner Violence: Not on file    FH:  Family History  Problem Relation Age of Onset   Healthy Mother        No history of CAD   Healthy Father        No history of CAD   Heart attack Father    Diabetes Brother    Stroke Neg Hx     Past Medical History:  Diagnosis Date   Acute MI, lateral wall, initial episode of care (HCC) 03/23/2014   CAD (coronary artery disease)    a. s/p Lat MI 2/16 >> s/p CABG; b. LHC  1/17: mLAD 100, oD1 85%, oLCx 100, OM1 filled by collats from RPLB2, RPDA stent 95% ISR, L-LAD patent, S-OM1 occluded, S-D1 occluded >> PCI: Promus DES to RPDA  - residual CAD not amenable to PCI (dist vessel/diabetic appearance)    Carotid stenosis    a. Carotid US  2/19 bilat ICA 1-39%   Chronic combined systolic and diastolic CHF (congestive heart failure) (HCC)    Hyperlipidemia 10/16/2014   Hypertension    Ischemic cardiomyopathy    a. Echo at [facility] 3/16:  EF 35-40%, diff HK, mild LVH, mild LAE, mild TR;  b. Echo 7/16: mild LVH, EF 50%, inf and lat HK-AK, Gr 2 DD, mod LAE, mild RVE, reduced RVSF, trivial TR, PASP 30 mmHg // c. Echo 5/17: moderate LVH, EF 45-50%, anteroseptal HK, grade 1 diastolic dysfunction, aortic sclerosis    Type 2 diabetes mellitus (HCC) 03/23/2014    Current Outpatient Medications  Medication Sig Dispense Refill   albuterol  (VENTOLIN  HFA) 108 (90 Base) MCG/ACT inhaler Inhale 1-2 puffs into the lungs every 6 (six) hours as needed for wheezing or shortness of breath.     aspirin  EC 81 MG tablet Take 1 tablet (81 mg total) by mouth daily. 90 tablet 3   Cholecalciferol (VITAMIN D3) 50 MCG (2000 UT) capsule Take 5,000 Units by mouth daily.     clopidogrel  (PLAVIX ) 75 MG tablet TAKE 1 TABLET BY MOUTH EVERY DAY 15 tablet 0   Glycerin-Hypromellose-PEG 400 ([facility] DRY EYE RELIEF) 0.2-0.2-1 % SOLN Place 1 drop into both eyes daily as needed (dry eyes).     HUMALOG KWIKPEN 200 UNIT/ML KwikPen Inject 206 Units into the skin as directed. (Administered via insulin  pump)     ibuprofen (ADVIL) 200 MG tablet Take 200 mg by mouth every 6 (six) hours as needed for headache or mild pain. (Patient not taking: Reported on 02/03/2023)     ipratropium-albuterol  (DUONEB) 0.5-2.5 (3) MG/3ML SOLN Take 3 mLs by nebulization every 2 (two) hours as needed. 360 mL 0   isosorbide  mononitrate (IMDUR ) 30 MG 24 hr tablet Take 1 tablet (30 mg total) by mouth daily. 90 tablet 2   magnesium  oxide (MAG-OX) 400  (240 Mg) MG tablet Take 400 mg by mouth daily.     metoprolol  succinate (TOPROL -XL) 100 MG 24 hr tablet TAKE 1 TABLET BY MOUTH EVERY DAY 90 tablet 3   nitroGLYCERIN  (NITROSTAT ) 0.4 MG SL tablet Place 1 tablet (0.4 mg total) under the tongue every 5 (five) minutes as needed for chest pain. 25 tablet 2   pantoprazole  (PROTONIX ) 40 MG tablet Take 40 mg by mouth daily.     potassium chloride  SA (KLOR-CON  M20) 20 MEQ tablet Take 2 tablets (40 mEq total) by mouth daily. 60 tablet 11  prednisoLONE acetate (PRED FORTE) 1 % ophthalmic suspension Place 1 drop into the left eye 4 (four) times daily.     rosuvastatin  (CRESTOR ) 40 MG tablet Take 1 tablet (40 mg total) by mouth daily. 90 tablet 1   Semaglutide-Weight Management (WEGOVY) 0.25 MG/0.5ML SOAJ Inject 0.25 mg into the skin.     torsemide  (DEMADEX ) 20 MG tablet TAKE 1 TABLET BY MOUTH EVERY DAY 90 tablet 3   valsartan  (DIOVAN ) 160 MG tablet Take 1 tablet (160 mg total) by mouth daily. 90 tablet 3   No current facility-administered medications for this visit.   Vitals:   08/12/23 1355  BP: 134/74  Pulse: 88  SpO2: 95%  Weight: 282 lb (127.9 kg)   Wt Readings from Last 3 Encounters:  08/12/23 282 lb (127.9 kg)  02/03/23 287 lb (130.2 kg)  01/04/23 291 lb (132 kg)   Lab Results  Component Value Date   CREATININE 1.32 (H) 02/03/2023   CREATININE 1.07 01/04/2023   CREATININE 1.19 08/13/2022    PHYSICAL EXAM:  General: Well appearing. No resp difficulty HEENT: normal Neck: supple, no JVD Cor: Regular rhythm, rate. No rubs, gallops or murmurs Lungs: clear Abdomen: soft, nontender, nondistended. Extremities: no cyanosis, clubbing, rash, trace pitting edema bilateral lower legs Neuro: alert & oriented X 3. Moves all 4 extremities w/o difficulty. Affect pleasant   ECG:  not done    ASSESSMENT & PLAN:  1: Chronic ischemic heart failure with preserved ejection fraction- - cath shows CAD - NYHA class II - euvolemic - weight down 5  pounds from last visit here 6 months ago - Echo 03/23/14: EF 50% with mild LVH, mild Danny/TR - Echo 08/26/14: EF 50% with mild LVH, Grade II DD, moderate LAE - Echo 06/18/15: EF 45-50% with moderate LVH, trivial Danny - Echo 07/07/17: EF 50-55% with Grade I DD, mild LVH - Echo 09/08/20: EF >55% with Grade I DD - Echo 12/13/20: EF of 55-60% along with moderate LVH and trivial Danny.  - Echo 06/14/22: EF 50-55% along with mild Danny.  - does add salt to eggs but says that's about it - continue torsemide  20mg  daily - continue Klor-con  40meq QD - continue valsartan  160mg  daily - since no data with betablocker in HFpEF and he's been without for awhile, will not resume as HR is normal - BMET today - Type 1 diabetic so not a candidate for SGLT2 - spironolactone  caused creatinine to rise - BNP 12/12/20 was 2230   2: HTN- - BP 134/74 - saw PCP 05/25 - BMP 02/03/23 reviewed: sodium 144, potassium 4.4, creatinine 1.32 & GFR 59 - BMET today  3: Type 1 DM- - saw endocrinology Leatrice) 06/25 - A1c 05/02/23 was 7.7% - insurance will no longer cover GLP1  4: CAD- - saw cardiology Corlis) 12/23; encouraged him to call his office and get f/u appointment scheduled - continue rosuvastatin  40mg  daily - continue isosorbide  MN 30mg  daily - continue clopidogrel  75mg  daily - continue ASA 81mg  daily - LDL 07/15/22 was 48 - lipid panel today - LHC done 12/12/20: Severe two-vessel coronary artery disease with chronic total occlusions of the mid LAD and ostial LCx, as noted on prior catheterizations. Mild, nonobstructive RCA disease with patent PDA stent. Widely patent LIMA-LAD bypass graft.  SVG-D1 and SVG-OM1 are known to be occluded and were not engaged on today's study. Mildly elevated left ventricular filling pressure (LVEDP 15-20 mmHg) with grossly normal left ventricular contraction.   Return in 6 months, sooner if needed.  Ellouise DELENA Class, FNP 08/11/23

## 2023-08-11 NOTE — Telephone Encounter (Signed)
 Called to confirm/remind patient of their appointment at the [facility] on 08/12/23.   Appointment:   [x] Confirmed  [] Left mess   [] No answer/No voice mail  [] VM Full/unable to leave message  [] Phone not in service  Patient reminded to bring all medications and/or complete list.  Confirmed patient has transportation. Gave directions, instructed to utilize valet parking.

## 2023-08-12 ENCOUNTER — Ambulatory Visit: Attending: Family | Admitting: Family

## 2023-08-12 ENCOUNTER — Encounter: Payer: Self-pay | Admitting: Family

## 2023-08-12 ENCOUNTER — Other Ambulatory Visit: Payer: Self-pay | Admitting: Cardiovascular Disease

## 2023-08-12 VITALS — BP 134/74 | HR 88 | Wt 282.0 lb

## 2023-08-12 DIAGNOSIS — I25118 Atherosclerotic heart disease of native coronary artery with other forms of angina pectoris: Secondary | ICD-10-CM | POA: Diagnosis not present

## 2023-08-12 DIAGNOSIS — Z955 Presence of coronary angioplasty implant and graft: Secondary | ICD-10-CM | POA: Diagnosis not present

## 2023-08-12 DIAGNOSIS — I10 Essential (primary) hypertension: Secondary | ICD-10-CM

## 2023-08-12 DIAGNOSIS — I5032 Chronic diastolic (congestive) heart failure: Secondary | ICD-10-CM | POA: Diagnosis present

## 2023-08-12 DIAGNOSIS — I255 Ischemic cardiomyopathy: Secondary | ICD-10-CM | POA: Insufficient documentation

## 2023-08-12 DIAGNOSIS — Z951 Presence of aortocoronary bypass graft: Secondary | ICD-10-CM | POA: Insufficient documentation

## 2023-08-12 DIAGNOSIS — Z794 Long term (current) use of insulin: Secondary | ICD-10-CM | POA: Insufficient documentation

## 2023-08-12 DIAGNOSIS — E109 Type 1 diabetes mellitus without complications: Secondary | ICD-10-CM | POA: Diagnosis not present

## 2023-08-12 DIAGNOSIS — I2510 Atherosclerotic heart disease of native coronary artery without angina pectoris: Secondary | ICD-10-CM | POA: Insufficient documentation

## 2023-08-12 DIAGNOSIS — Z79899 Other long term (current) drug therapy: Secondary | ICD-10-CM | POA: Diagnosis not present

## 2023-08-12 DIAGNOSIS — E785 Hyperlipidemia, unspecified: Secondary | ICD-10-CM | POA: Diagnosis not present

## 2023-08-12 DIAGNOSIS — I110 Hypertensive heart disease with heart failure: Secondary | ICD-10-CM | POA: Diagnosis not present

## 2023-08-12 DIAGNOSIS — Z7982 Long term (current) use of aspirin: Secondary | ICD-10-CM | POA: Diagnosis not present

## 2023-08-12 DIAGNOSIS — Z7902 Long term (current) use of antithrombotics/antiplatelets: Secondary | ICD-10-CM | POA: Insufficient documentation

## 2023-08-12 NOTE — Patient Instructions (Signed)
  Lab Work:  Go DOWN to LOWER LEVEL (LL) to have your blood work completed inside of [employer] office.  We will only call you if the results are abnormal or if the provider would like to make medication changes.   Special Instructions // Education:  PLEASE CALL THE GENERAL CARDIOLOGY OFFICE TO ARRANGE A FOLLOW UP APPOINTMENT  Follow-Up in: 6 months.  At the [facility], you and your health needs are our priority. We have a designated team specialized in the treatment of Heart Failure. This Care Team includes your primary Heart Failure Specialized Cardiologist (physician), Advanced Practice Providers (APPs- Physician Assistants and Nurse Practitioners), and Pharmacist who all work together to provide you with the care you need, when you need it.   You may see any of the following providers on your designated Care Team at your next follow up:  Dr. Toribio Fuel Dr. Ezra Shuck Dr. Ria Commander Dr. Odis Brownie Ellouise Class, FNP Jaun Bash, RPH-CPP  Please be sure to bring in all your medications bottles to every appointment.   Need to Contact Us :  If you have any questions or concerns before your next appointment please send us  a message through [place] or call our office at [phone].    TO LEAVE A MESSAGE FOR THE NURSE SELECT OPTION 2, PLEASE LEAVE A MESSAGE INCLUDING: YOUR NAME DATE OF BIRTH CALL BACK NUMBER REASON FOR CALL**this is important as we prioritize the call backs  YOU WILL RECEIVE A CALL BACK THE SAME DAY AS LONG AS YOU CALL BEFORE 4:00 PM

## 2023-08-13 LAB — BASIC METABOLIC PANEL WITH GFR
BUN/Creatinine Ratio: 11 (ref 10–24)
BUN: 20 mg/dL (ref 8–27)
CO2: 21 mmol/L (ref 20–29)
Calcium: 9.6 mg/dL (ref 8.6–10.2)
Chloride: 101 mmol/L (ref 96–106)
Creatinine, Ser: 1.79 mg/dL — ABNORMAL HIGH (ref 0.76–1.27)
Glucose: 140 mg/dL — ABNORMAL HIGH (ref 70–99)
Potassium: 4.5 mmol/L (ref 3.5–5.2)
Sodium: 140 mmol/L (ref 134–144)
eGFR: 41 mL/min/{1.73_m2} — ABNORMAL LOW (ref >59)

## 2023-08-13 LAB — LIPID PANEL
Chol/HDL Ratio: 3.3 ratio (ref 0.0–5.0)
Cholesterol, Total: 120 mg/dL (ref 100–199)
HDL: 36 mg/dL — ABNORMAL LOW (ref >39)
LDL Chol Calc (NIH): 48 mg/dL (ref 0–99)
Triglycerides: 225 mg/dL — ABNORMAL HIGH (ref 0–149)
VLDL Cholesterol Cal: 36 mg/dL (ref 5–40)

## 2023-08-15 ENCOUNTER — Other Ambulatory Visit: Payer: Self-pay | Admitting: Family

## 2023-08-15 ENCOUNTER — Ambulatory Visit: Payer: Self-pay | Admitting: Family

## 2023-08-15 DIAGNOSIS — I5032 Chronic diastolic (congestive) heart failure: Secondary | ICD-10-CM

## 2023-08-18 ENCOUNTER — Telehealth: Payer: Self-pay | Admitting: Cardiovascular Disease

## 2023-08-18 NOTE — Telephone Encounter (Signed)
This is ok with me thanks

## 2023-08-18 NOTE — Telephone Encounter (Signed)
  Patient would like to switch from Dr Wonda to Dr Gollan due to location. He lives closer to [place].

## 2023-09-17 ENCOUNTER — Other Ambulatory Visit: Payer: Self-pay | Admitting: Family

## 2023-09-17 DIAGNOSIS — I25118 Atherosclerotic heart disease of native coronary artery with other forms of angina pectoris: Secondary | ICD-10-CM

## 2023-12-13 ENCOUNTER — Other Ambulatory Visit: Payer: Self-pay | Admitting: Family

## 2023-12-16 ENCOUNTER — Other Ambulatory Visit: Payer: Self-pay | Admitting: Cardiovascular Disease

## 2024-01-20 ENCOUNTER — Ambulatory Visit: Attending: Cardiovascular Disease | Admitting: Cardiovascular Disease

## 2024-01-20 ENCOUNTER — Telehealth: Payer: Self-pay | Admitting: Family

## 2024-01-20 ENCOUNTER — Encounter: Payer: Self-pay | Admitting: Cardiovascular Disease

## 2024-01-20 VITALS — BP 118/78 | HR 68 | Ht 68.0 in | Wt 289.5 lb

## 2024-01-20 DIAGNOSIS — I5042 Chronic combined systolic (congestive) and diastolic (congestive) heart failure: Secondary | ICD-10-CM | POA: Diagnosis not present

## 2024-01-20 DIAGNOSIS — E782 Mixed hyperlipidemia: Secondary | ICD-10-CM

## 2024-01-20 DIAGNOSIS — I5032 Chronic diastolic (congestive) heart failure: Secondary | ICD-10-CM

## 2024-01-20 DIAGNOSIS — I25118 Atherosclerotic heart disease of native coronary artery with other forms of angina pectoris: Secondary | ICD-10-CM | POA: Diagnosis not present

## 2024-01-20 DIAGNOSIS — I10 Essential (primary) hypertension: Secondary | ICD-10-CM

## 2024-01-20 DIAGNOSIS — I890 Lymphedema, not elsewhere classified: Secondary | ICD-10-CM

## 2024-01-20 MED ORDER — CLOPIDOGREL BISULFATE 75 MG PO TABS: 75.0000 mg | ORAL_TABLET | Freq: Every day | ORAL | 3 refills | Status: AC

## 2024-01-20 NOTE — Patient Instructions (Addendum)
 Medication Instructions:  Hold aspirin  Restart plavix  75 mg daily  If you need a refill on your cardiac medications before your next appointment, please call your pharmacy.   Lab work: No new labs needed  Testing/Procedures: No new testing needed  Follow-Up: At [facility], you and your health needs are our priority.  As part of our continuing mission to provide you with exceptional heart care, we have created designated Provider Care Teams.  These Care Teams include your primary Cardiologist (physician) and Advanced Practice Providers (APPs -  Physician Assistants and Nurse Practitioners) who all work together to provide you with the care you need, when you need it.  You will need a follow up appointment in 12 months  Providers on your designated Care Team:   Lonni Meager, NP Bernardino Bring, PA-C Cadence [place], [place]  COVID-19 Vaccine Information can be found at: [email or web address] For questions related to vaccine distribution or appointments, please email [email or web address] or call [phone].

## 2024-01-20 NOTE — Telephone Encounter (Signed)
 Called to confirm/remind patient of their appointment at the [facility] on 01/23/24.   Appointment:   [x] Confirmed  [] Left mess   [] No answer/No voice mail  [] VM Full/unable to leave message  [] Phone not in service  Patient reminded to bring all medications and/or complete list.  Confirmed patient has transportation. Gave directions, instructed to utilize valet parking.

## 2024-01-20 NOTE — Progress Notes (Signed)
 Cardiology Office Note  Date:  01/20/2024   ID:  Danny, [name] 28-Jul-1955, MRN [id number]  PCP:  Danny Clotilda DEL, NP   Chief Complaint  Patient presents with   Follow-up    Patient c/o fluctuating blood pressure. Patient denies chest pain or shortness of breath.     HPI:  Danny [name] is a 68 y.o. male with past medical history of: Past Medical History:  Diagnosis Date   Acute MI, lateral wall, initial episode of care (HCC) 03/23/2014   CAD (coronary artery disease)    a. s/p Lat MI 2/16 >> s/p CABG; b. LHC 1/17: mLAD 100, oD1 85%, oLCx 100, OM1 filled by collats from RPLB2, RPDA stent 95% ISR, L-LAD patent, S-OM1 occluded, S-D1 occluded >> PCI: Promus DES to RPDA  - residual CAD not amenable to PCI (dist vessel/diabetic appearance)    Carotid stenosis    a. Carotid US  2/19 bilat ICA 1-39%   Chronic combined systolic and diastolic CHF (congestive heart failure) (HCC)    Hyperlipidemia 10/16/2014   Hypertension    Ischemic cardiomyopathy    a. Echo at [facility] 3/16:  EF 35-40%, diff HK, mild LVH, mild LAE, mild TR;  b. Echo 7/16: mild LVH, EF 50%, inf and lat HK-AK, Gr 2 DD, mod LAE, mild RVE, reduced RVSF, trivial TR, PASP 30 mmHg // c. Echo 5/17: moderate LVH, EF 45-50%, anteroseptal HK, grade 1 diastolic dysfunction, aortic sclerosis    Type 2 diabetes mellitus (HCC) 03/23/2014  Who presents to establish care in the [facility] office, follow-up of his congestive heart failure and coronary artery disease  Prior notes detailed as below HFrEF, ICM, DM2, HTN, HLD, carotid artery disease,  CAD s/p CABG in 2016 with cath 02/2015 showing S-OM and S-Dx occluded, patency of the LIMA to LAD, and DES to RPDA   Cardiac catheterization in 2022 showed no significant change  Followed by CHF clinic Last seen by Dr. Wonda December 2023  Denies significant chest pain on exertion No regular exercise program Reports feeling well in general No lower extremity edema, denies  PND orthopnea  Lab work reviewed Total chol 205, LDL 128, unclear why numbers jumped, possible lab error? Up from 120 Reports compliance with his Crestor  40 daily  EKG personally reviewed by myself on todays visit EKG Interpretation Date/Time:  Friday January 20 2024 10:22:40 EST Ventricular Rate:  68 PR Interval:  210 QRS Duration:  122 QT Interval:  426 QTC Calculation: 452 R Axis:   -21  Text Interpretation: Sinus rhythm with 1st degree A-V block Non-specific intra-ventricular conduction delay T wave abnormality, consider lateral ischemia When compared with ECG of 14-Dec-2022 11:19, No significant change was found Confirmed by Perla Lye [phone]) on 01/20/2024 10:38:08 AM     PMH:   has a past medical history of Acute MI, lateral wall, initial episode of care [facility]) (03/23/2014), CAD (coronary artery disease), Carotid stenosis, Chronic combined systolic and diastolic CHF (congestive heart failure) (HCC), Hyperlipidemia (10/16/2014), Hypertension, Ischemic cardiomyopathy, and Type 2 diabetes mellitus (HCC) (03/23/2014).   PSH:    Past Surgical History:  Procedure Laterality Date   CARDIAC CATHETERIZATION N/A 02/24/2015   Procedure: Left Heart Cath and Cors/Grafts Angiography;  Surgeon: Ozell Wonda, MD;  Location: [facility] INVASIVE CV LAB;  Service: Cardiovascular;  Laterality: N/A;   CARDIAC CATHETERIZATION N/A 02/24/2015   Procedure: Coronary Stent Intervention;  Surgeon: Ozell Wonda, MD;  Location: [facility] INVASIVE CV LAB;  Service: Cardiovascular;  Laterality: N/A;   CORONARY  ARTERY BYPASS GRAFT N/A 03/28/2014   Procedure: CORONARY ARTERY BYPASS GRAFTING (CABG);  Surgeon: Elspeth JAYSON Millers, MD;  Location: [facility] OR;  Service: Open Heart Surgery;  Laterality: N/A;   LEFT HEART CATH AND CORS/GRAFTS ANGIOGRAPHY N/A 05/07/2019   Procedure: LEFT HEART CATH AND CORS/GRAFTS ANGIOGRAPHY;  Surgeon: Wonda Sharper, MD;  Location: [facility] INVASIVE CV LAB;  Service: Cardiovascular;  Laterality: N/A;    LEFT HEART CATH AND CORS/GRAFTS ANGIOGRAPHY N/A 12/12/2020   Procedure: LEFT HEART CATH AND CORS/GRAFTS ANGIOGRAPHY;  Surgeon: Mady Bruckner, MD;  Location: [facility] INVASIVE CV LAB;  Service: Cardiovascular;  Laterality: N/A;   LEFT HEART CATHETERIZATION WITH CORONARY ANGIOGRAM N/A 03/23/2014   Procedure: LEFT HEART CATHETERIZATION WITH CORONARY ANGIOGRAM;  Surgeon: Sharper JONETTA Wonda, MD;  Location: [facility] CATH LAB;  Service: Cardiovascular;  Laterality: N/A;   PERCUTANEOUS CORONARY STENT INTERVENTION (PCI-S)  02/24/2015   PDA   TEE WITHOUT CARDIOVERSION N/A 03/28/2014   Procedure: TRANSESOPHAGEAL ECHOCARDIOGRAM (TEE);  Surgeon: Elspeth JAYSON Millers, MD;  Location: [facility] OR;  Service: Open Heart Surgery;  Laterality: N/A;    Current Outpatient Medications  Medication Sig Dispense Refill   albuterol  (VENTOLIN  HFA) 108 (90 Base) MCG/ACT inhaler Inhale 1-2 puffs into the lungs every 6 (six) hours as needed for wheezing or shortness of breath.     aspirin  EC 81 MG tablet Take 1 tablet (81 mg total) by mouth daily. 90 tablet 3   Cholecalciferol (VITAMIN D3) 50 MCG (2000 UT) capsule Take 5,000 Units by mouth daily.     clopidogrel  (PLAVIX ) 75 MG tablet TAKE 1 TABLET BY MOUTH EVERY DAY 15 tablet 0   Glycerin-Hypromellose-PEG 400 ([employer] DRY EYE RELIEF) 0.2-0.2-1 % SOLN Place 1 drop into both eyes daily as needed (dry eyes).     HUMALOG KWIKPEN 200 UNIT/ML KwikPen Inject 206 Units into the skin as directed. (Administered via insulin  pump)     ibuprofen (ADVIL) 200 MG tablet Take 200 mg by mouth every 6 (six) hours as needed for headache or mild pain.     ipratropium-albuterol  (DUONEB) 0.5-2.5 (3) MG/3ML SOLN Take 3 mLs by nebulization every 2 (two) hours as needed. 360 mL 0   isosorbide  mononitrate (IMDUR ) 30 MG 24 hr tablet TAKE 1 TABLET BY MOUTH EVERY DAY 90 tablet 2   magnesium  oxide (MAG-OX) 400 (240 Mg) MG tablet Take 400 mg by mouth daily.     meloxicam (MOBIC) 15 MG tablet Take 15 mg by mouth daily.      nitroGLYCERIN  (NITROSTAT ) 0.4 MG SL tablet Place 1 tablet (0.4 mg total) under the tongue every 5 (five) minutes as needed for chest pain. 25 tablet 2   pantoprazole  (PROTONIX ) 40 MG tablet Take 40 mg by mouth daily.     potassium chloride  SA (KLOR-CON  M20) 20 MEQ tablet Take 2 tablets (40 mEq total) by mouth daily. 60 tablet 11   prednisoLONE acetate (PRED FORTE) 1 % ophthalmic suspension Place 1 drop into the left eye 4 (four) times daily.     rosuvastatin  (CRESTOR ) 40 MG tablet TAKE 1 TABLET BY MOUTH EVERY DAY 90 tablet 1   torsemide  (DEMADEX ) 20 MG tablet Take 40 mg by mouth daily.     valsartan  (DIOVAN ) 160 MG tablet TAKE 1 TABLET BY MOUTH EVERY DAY 90 tablet 3   No current facility-administered medications for this visit.     Allergies:   Spironolactone    Social History:  The patient  reports that he has never smoked. He has never used smokeless tobacco. He  reports current alcohol use. He reports that he does not use drugs.   Family History:   family history includes Diabetes in his brother; Healthy in his father and mother; Heart attack in his father.    Review of Systems: Review of Systems  Constitutional: Negative.   HENT: Negative.    Respiratory: Negative.    Cardiovascular: Negative.   Gastrointestinal: Negative.   Musculoskeletal: Negative.   Neurological: Negative.   Psychiatric/Behavioral: Negative.    All other systems reviewed and are negative.   PHYSICAL EXAM: VS:  BP 118/78 (BP Location: Left Arm, Patient Position: Sitting, Cuff Size: Large)   Pulse 68   Ht 5' 8 (1.727 m)   Wt 289 lb 8 oz (131.3 kg)   SpO2 92%   BMI 44.02 kg/m  , BMI Body mass index is 44.02 kg/m. GEN: Well nourished, well developed, in no acute distress HEENT: normal Neck: no JVD, carotid bruits, or masses Cardiac: RRR; no murmurs, rubs, or gallops,no edema  Respiratory:  clear to auscultation bilaterally, normal work of breathing GI: soft, nontender, nondistended, + BS MS: no  deformity or atrophy Skin: warm and dry, no rash Neuro:  Strength and sensation are intact Psych: euthymic mood, full affect  Recent Labs: 08/12/2023: BUN 20; Creatinine, Ser 1.79; Potassium 4.5; Sodium 140    Lipid Panel Lab Results  Component Value Date   CHOL 120 08/12/2023   HDL 36 (L) 08/12/2023   LDLCALC 48 08/12/2023   TRIG 225 (H) 08/12/2023      Wt Readings from Last 3 Encounters:  01/20/24 289 lb 8 oz (131.3 kg)  08/12/23 282 lb (127.9 kg)  02/03/23 287 lb (130.2 kg)       ASSESSMENT AND PLAN:  Problem List Items Addressed This Visit       Cardiology Problems   Hyperlipidemia   Relevant Medications   torsemide  (DEMADEX ) 20 MG tablet   Chronic combined systolic and diastolic congestive heart failure (HCC)   Relevant Medications   torsemide  (DEMADEX ) 20 MG tablet   Other Visit Diagnoses       Coronary artery disease of native artery of native heart with stable angina pectoris    -  Primary   Relevant Medications   meloxicam (MOBIC) 15 MG tablet   torsemide  (DEMADEX ) 20 MG tablet   Other Relevant Orders   EKG 12-Lead (Completed)     Chronic diastolic congestive heart failure (HCC)       Relevant Medications   torsemide  (DEMADEX ) 20 MG tablet   Other Relevant Orders   EKG 12-Lead (Completed)     Primary hypertension       Relevant Medications   torsemide  (DEMADEX ) 20 MG tablet   Other Relevant Orders   EKG 12-Lead (Completed)     Lymphedema          Coronary artery disease with stable angina History of CABG Last catheterization October 2022, stable disease Patent LIMA to the LAD, occluded vein graft to the D1, occluded vein graft to the OM1 Chronic total occlusions mid LAD and ostial left circumflex Currently with no symptoms of angina. No further workup at this time. Continue current medication regimen. Stressed the importance of taking Crestor  40 daily, restart Plavix , off aspirin   Diabetes type 2 with complications Managed by primary  care Stressed importance of low carbohydrate diet, weight loss, exercise program A1c 7.2 above goal We have encouraged continued exercise, careful diet management in an effort to lose weight.  Hyperlipidemia Cholesterol have been well-controlled  on Crestor  40, 1 month later numbers jump from 120 up to 170s of unclear reason as he reports medication compliance Stressed the importance of staying on Crestor  40 daily  Essential hypertension Blood pressure is well controlled on today's visit. No changes made to the medications.    Signed, Velinda Lunger, M.D., Ph.D. [facility] [place], [place] [phone]

## 2024-01-22 NOTE — Progress Notes (Unsigned)
 Advanced Heart Failure Clinic Note    PCP: Steva Kirsch, NP  Primary Cardiologist: Wonda Sharper, MD   Chief Complaint: shortness of breath   HPI:  [name] is a 68 y/o male with a history of CAD, CABG (2016), DM2, hyperlipidemia, HTN, carotid stenosis and chronic heart failure. Cath 02/2015 showing S-OM and S-Dx occluded, patency of the LIMA to LAD, and DES to RPDA   Hasn't been admitted or been in the ED in the last 6 months.   Echo 12/13/20: EF of 55-60% along with moderate LVH and trivial MR.  Echo 06/14/22: EF 50-55% along with mild MR.   He presents today for a HF follow-up visit with a chief complaint of minimal shortness of breath. Has associated fatigue. Denies chest pain, palpitations, abdominal distention, pedal edema, dizziness or difficulty sleeping. Wearing compression socks daily. Insurance will no longer cover GLP1.   In reviewing medication bottles, he doesn't have metoprolol  and says that he's not sure how long he's been without it.    Previous cardiac studies:  Echo 03/23/14: EF 50% with mild LVH, mild MR/TR Echo 08/26/14: EF 50% with mild LVH, Grade II DD, moderate LAE Echo 06/18/15: EF 45-50% with moderate LVH, trivial MR Echo 07/07/17: EF 50-55% with Grade I DD, mild LVH Echo 09/08/20: EF >55% with Grade I DD  LHC done 12/12/20 and showed: Severe two-vessel coronary artery disease with chronic total occlusions of the mid LAD and ostial LCx, as noted on prior catheterizations. Mild, nonobstructive RCA disease with patent PDA stent. Widely patent LIMA-LAD bypass graft.  SVG-D1 and SVG-OM1 are known to be occluded and were not engaged on today's study. Mildly elevated left ventricular filling pressure (LVEDP 15-20 mmHg) with grossly normal left ventricular contraction.  ROS: All systems negative except as listed in HPI, PMH and Problem List.  SH:  Social History   Socioeconomic History   Marital status: Single    Spouse name: Not on file   Number of  children: Not on file   Years of education: Not on file   Highest education level: Not on file  Occupational History   Not on file  Tobacco Use   Smoking status: Never   Smokeless tobacco: Never  Vaping Use   Vaping status: Never Used  Substance and Sexual Activity   Alcohol use: Yes   Drug use: No   Sexual activity: Not on file  Other Topics Concern   Not on file  Social History Narrative   The patient is married. He has grown children. He is a lifelong nonsmoker. He works in production designer, theatre/television/film.   Social Drivers of Corporate Investment Banker Strain: Low Risk  (09/13/2023)   Received from [facility]   Overall Financial Resource Strain (CARDIA)    Difficulty of Paying Living Expenses: Not hard at all  Food Insecurity: Food Insecurity Present (09/13/2023)   Received from [facility]   Hunger Vital Sign    Within the past 12 months, you worried that your food would run out before you got the money to buy more.: Sometimes true    Within the past 12 months, the food you bought just didn't last and you didn't have money to get more.: Never true  Transportation Needs: No Transportation Needs (09/13/2023)   Received from [facility] - Transportation    In the past 12 months, has lack of transportation kept you from medical appointments or from getting medications?: No  Lack of Transportation (Non-Medical): No  Physical Activity: Inactive (09/13/2023)   Received from [facility]   Exercise Vital Sign    On average, how many days per week do you engage in moderate to strenuous exercise (like a brisk walk)?: 0 days    On average, how many minutes do you engage in exercise at this level?: 0 min  Stress: No Stress Concern Present (09/13/2023)   Received from [facility] - Occupational Stress Questionnaire    Feeling of Stress : Not at all  Social  Connections: Moderately Isolated (09/13/2023)   Received from [facility]   Social Connection and Isolation Panel    In a typical week, how many times do you talk on the phone with family, friends, or neighbors?: Once a week    How often do you get together with friends or relatives?: Once a week    How often do you attend church or religious services?: More than 4 times per year    Do you belong to any clubs or organizations such as church groups, unions, fraternal or athletic groups, or school groups?: Yes    How often do you attend meetings of the clubs or organizations you belong to?: More than 4 times per year    Are you married, widowed, divorced, separated, never married, or living with a partner?: Divorced  Intimate Partner Violence: Not on file    FH:  Family History  Problem Relation Age of Onset   Healthy Mother        No history of CAD   Healthy Father        No history of CAD   Heart attack Father    Diabetes Brother    Stroke Neg Hx     Past Medical History:  Diagnosis Date   Acute MI, lateral wall, initial episode of care (HCC) 03/23/2014   CAD (coronary artery disease)    a. s/p Lat MI 2/16 >> s/p CABG; b. LHC 1/17: mLAD 100, oD1 85%, oLCx 100, OM1 filled by collats from RPLB2, RPDA stent 95% ISR, L-LAD patent, S-OM1 occluded, S-D1 occluded >> PCI: Promus DES to RPDA  - residual CAD not amenable to PCI (dist vessel/diabetic appearance)    Carotid stenosis    a. Carotid US  2/19 bilat ICA 1-39%   Chronic combined systolic and diastolic CHF (congestive heart failure) (HCC)    Hyperlipidemia 10/16/2014   Hypertension    Ischemic cardiomyopathy    a. Echo at [facility] 3/16:  EF 35-40%, diff HK, mild LVH, mild LAE, mild TR;  b. Echo 7/16: mild LVH, EF 50%, inf and lat HK-AK, Gr 2 DD, mod LAE, mild RVE, reduced RVSF, trivial TR, PASP 30 mmHg // c. Echo 5/17: moderate LVH, EF 45-50%, anteroseptal HK, grade 1 diastolic dysfunction, aortic sclerosis    Type 2  diabetes mellitus (HCC) 03/23/2014    Current Outpatient Medications  Medication Sig Dispense Refill   albuterol  (VENTOLIN  HFA) 108 (90 Base) MCG/ACT inhaler Inhale 1-2 puffs into the lungs every 6 (six) hours as needed for wheezing or shortness of breath.     Cholecalciferol (VITAMIN D3) 50 MCG (2000 UT) capsule Take 5,000 Units by mouth daily.     clopidogrel  (PLAVIX ) 75 MG tablet Take 1 tablet (75 mg total) by mouth daily. 90 tablet 3   Glycerin-Hypromellose-PEG 400 ([employer] DRY EYE RELIEF) 0.2-0.2-1 % SOLN Place 1 drop into both eyes daily as  needed (dry eyes).     HUMALOG KWIKPEN 200 UNIT/ML KwikPen Inject 206 Units into the skin as directed. (Administered via insulin  pump)     ibuprofen (ADVIL) 200 MG tablet Take 200 mg by mouth every 6 (six) hours as needed for headache or mild pain.     ipratropium-albuterol  (DUONEB) 0.5-2.5 (3) MG/3ML SOLN Take 3 mLs by nebulization every 2 (two) hours as needed. 360 mL 0   isosorbide  mononitrate (IMDUR ) 30 MG 24 hr tablet TAKE 1 TABLET BY MOUTH EVERY DAY 90 tablet 2   magnesium  oxide (MAG-OX) 400 (240 Mg) MG tablet Take 400 mg by mouth daily.     meloxicam (MOBIC) 15 MG tablet Take 15 mg by mouth daily.     nitroGLYCERIN  (NITROSTAT ) 0.4 MG SL tablet Place 1 tablet (0.4 mg total) under the tongue every 5 (five) minutes as needed for chest pain. 25 tablet 2   pantoprazole  (PROTONIX ) 40 MG tablet Take 40 mg by mouth daily.     potassium chloride  SA (KLOR-CON  M20) 20 MEQ tablet Take 2 tablets (40 mEq total) by mouth daily. 60 tablet 11   prednisoLONE acetate (PRED FORTE) 1 % ophthalmic suspension Place 1 drop into the left eye 4 (four) times daily.     rosuvastatin  (CRESTOR ) 40 MG tablet TAKE 1 TABLET BY MOUTH EVERY DAY 90 tablet 1   torsemide  (DEMADEX ) 20 MG tablet Take 40 mg by mouth daily.     valsartan  (DIOVAN ) 160 MG tablet TAKE 1 TABLET BY MOUTH EVERY DAY 90 tablet 3   No current facility-administered medications for this visit.   There were no  vitals filed for this visit.  Wt Readings from Last 3 Encounters:  01/20/24 289 lb 8 oz (131.3 kg)  08/12/23 282 lb (127.9 kg)  02/03/23 287 lb (130.2 kg)   Lab Results  Component Value Date   CREATININE 1.79 (H) 08/12/2023   CREATININE 1.32 (H) 02/03/2023   CREATININE 1.07 01/04/2023    PHYSICAL EXAM:  General: Well appearing. No resp difficulty HEENT: normal Neck: supple, no JVD Cor: Regular rhythm, rate. No rubs, gallops or murmurs Lungs: clear Abdomen: soft, nontender, nondistended. Extremities: no cyanosis, clubbing, rash, trace pitting edema bilateral lower legs Neuro: alert & oriented X 3. Moves all 4 extremities w/o difficulty. Affect pleasant   ECG:  not done    ASSESSMENT & PLAN:  1: Chronic ischemic heart failure with preserved ejection fraction- - cath shows CAD - NYHA class II - euvolemic - weight down 5 pounds from last visit here 6 months ago - Echo 03/23/14: EF 50% with mild LVH, mild MR/TR - Echo 08/26/14: EF 50% with mild LVH, Grade II DD, moderate LAE - Echo 06/18/15: EF 45-50% with moderate LVH, trivial MR - Echo 07/07/17: EF 50-55% with Grade I DD, mild LVH - Echo 09/08/20: EF >55% with Grade I DD - Echo 12/13/20: EF of 55-60% along with moderate LVH and trivial MR.  - Echo 06/14/22: EF 50-55% along with mild MR.  - does add salt to eggs but says that's about it - continue torsemide  20mg  daily - continue Klor-con  40meq QD - continue valsartan  160mg  daily - since no data with betablocker in HFpEF and he's been without for awhile, will not resume as HR is normal - BMET today - Type 1 diabetic so not a candidate for SGLT2 - spironolactone  caused creatinine to rise - BNP 12/12/20 was 2230   2: HTN- - BP 134/74 - saw PCP 05/25 - BMP 02/03/23  reviewed: sodium 144, potassium 4.4, creatinine 1.32 & GFR 59 - BMET today  3: Type 1 DM- - saw endocrinology Leatrice) 06/25 - A1c 05/02/23 was 7.7% - insurance will no longer cover GLP1  4: CAD- -  saw cardiology Corlis) 12/23; encouraged him to call his office and get f/u appointment scheduled - continue rosuvastatin  40mg  daily - continue isosorbide  MN 30mg  daily - continue clopidogrel  75mg  daily - continue ASA 81mg  daily - LDL 07/15/22 was 48 - lipid panel today - LHC done 12/12/20: Severe two-vessel coronary artery disease with chronic total occlusions of the mid LAD and ostial LCx, as noted on prior catheterizations. Mild, nonobstructive RCA disease with patent PDA stent. Widely patent LIMA-LAD bypass graft.  SVG-D1 and SVG-OM1 are known to be occluded and were not engaged on today's study. Mildly elevated left ventricular filling pressure (LVEDP 15-20 mmHg) with grossly normal left ventricular contraction.   Return in 6 months, sooner if needed.   Ellouise DELENA Class, FNP 01/22/24

## 2024-01-23 ENCOUNTER — Ambulatory Visit: Attending: Family | Admitting: Family

## 2024-01-23 ENCOUNTER — Encounter: Payer: Self-pay | Admitting: Family

## 2024-01-23 ENCOUNTER — Other Ambulatory Visit: Payer: Self-pay

## 2024-01-23 VITALS — BP 153/82 | HR 68 | Wt 288.1 lb

## 2024-01-23 DIAGNOSIS — E1022 Type 1 diabetes mellitus with diabetic chronic kidney disease: Secondary | ICD-10-CM

## 2024-01-23 DIAGNOSIS — I5032 Chronic diastolic (congestive) heart failure: Secondary | ICD-10-CM | POA: Diagnosis not present

## 2024-01-23 DIAGNOSIS — I25118 Atherosclerotic heart disease of native coronary artery with other forms of angina pectoris: Secondary | ICD-10-CM | POA: Diagnosis not present

## 2024-01-23 DIAGNOSIS — N1832 Chronic kidney disease, stage 3b: Secondary | ICD-10-CM

## 2024-01-23 DIAGNOSIS — E782 Mixed hyperlipidemia: Secondary | ICD-10-CM

## 2024-01-23 DIAGNOSIS — I10 Essential (primary) hypertension: Secondary | ICD-10-CM

## 2024-01-23 MED ORDER — ATORVASTATIN CALCIUM 20 MG PO TABS: 20.0000 mg | ORAL_TABLET | Freq: Every day | ORAL | 3 refills | Status: DC

## 2024-01-23 NOTE — Patient Instructions (Addendum)
 It was good to see you today!  If you receive a satisfaction survey regarding the Heart Failure Clinic, please take the time to fill it out. This way we can continue to provide excellent care and make any changes that need to be made.    STOP Crestor  (Current Cholesterol Medicine)   START Lipitor  20 MG   Labs: Labs done today, your results will be available in MyChart, we will contact you for abnormal readings.    Follow Up: 6 weeks with Ellouise Class, NP

## 2024-01-24 ENCOUNTER — Ambulatory Visit: Payer: Self-pay | Admitting: Family

## 2024-01-24 LAB — BASIC METABOLIC PANEL WITH GFR
BUN/Creatinine Ratio: 10 (ref 10–24)
BUN: 14 mg/dL (ref 8–27)
CO2: 22 mmol/L (ref 20–29)
Calcium: 8.7 mg/dL (ref 8.6–10.2)
Chloride: 106 mmol/L (ref 96–106)
Creatinine, Ser: 1.37 mg/dL — ABNORMAL HIGH (ref 0.76–1.27)
Glucose: 166 mg/dL — ABNORMAL HIGH (ref 70–99)
Potassium: 4 mmol/L (ref 3.5–5.2)
Sodium: 139 mmol/L (ref 134–144)
eGFR: 57 mL/min/1.73 — ABNORMAL LOW (ref >59)

## 2024-01-24 LAB — LIPID PANEL
Chol/HDL Ratio: 3.7 ratio (ref 0.0–5.0)
Cholesterol, Total: 130 mg/dL (ref 100–199)
HDL: 35 mg/dL — ABNORMAL LOW (ref >39)
LDL Chol Calc (NIH): 71 mg/dL (ref 0–99)
Triglycerides: 134 mg/dL (ref 0–149)
VLDL Cholesterol Cal: 24 mg/dL (ref 5–40)

## 2024-01-29 ENCOUNTER — Encounter: Payer: Self-pay | Admitting: Family

## 2024-01-30 ENCOUNTER — Other Ambulatory Visit: Payer: Self-pay | Admitting: Family

## 2024-01-30 MED ORDER — EZETIMIBE 10 MG PO TABS: 10.0000 mg | ORAL_TABLET | Freq: Every day | ORAL | 3 refills | Status: AC

## 2024-03-02 ENCOUNTER — Telehealth: Payer: Self-pay | Admitting: Family

## 2024-03-02 NOTE — Telephone Encounter (Signed)
 Called to confirm/remind patient of their appointment at the Advanced Heart Failure Clinic on 03/05/24.   Appointment:   [x] Confirmed  [] Left mess   [] No answer/No voice mail  [] VM Full/unable to leave message  [] Phone not in service  Patient reminded to bring all medications and/or complete list.  Confirmed patient has transportation. Gave directions, instructed to utilize valet parking.

## 2024-03-04 NOTE — Progress Notes (Unsigned)
 "  Advanced Heart Failure Clinic Note    PCP: Steva Kirsch, NP  Primary Cardiologist: Wonda Sharper, MD   Chief Complaint:    HPI:  Mr Macrae is a 69 y/o male with a history of CAD, CABG (2016), DM2, hyperlipidemia, HTN, carotid stenosis and chronic heart failure. Cath 02/2015 showing S-OM and S-Dx occluded, patency of the LIMA to LAD, and DES to RPDA   Hasn't been admitted or been in the ED in the last 6 months.   Echo 12/13/20: EF of 55-60% along with moderate LVH and trivial MR.  Echo 06/14/22: EF 50-55% along with mild MR.   He presents today for a HF follow-up visit with a chief complaint of  Previous cardiac studies:  Echo 03/23/14: EF 50% with mild LVH, mild MR/TR Echo 08/26/14: EF 50% with mild LVH, Grade II DD, moderate LAE Echo 06/18/15: EF 45-50% with moderate LVH, trivial MR Echo 07/07/17: EF 50-55% with Grade I DD, mild LVH Echo 09/08/20: EF >55% with Grade I DD  LHC done 12/12/20 and showed: Severe two-vessel coronary artery disease with chronic total occlusions of the mid LAD and ostial LCx, as noted on prior catheterizations. Mild, nonobstructive RCA disease with patent PDA stent. Widely patent LIMA-LAD bypass graft.  SVG-D1 and SVG-OM1 are known to be occluded and were not engaged on today's study. Mildly elevated left ventricular filling pressure (LVEDP 15-20 mmHg) with grossly normal left ventricular contraction.  ROS: All systems negative except as listed in HPI, PMH and Problem List.  SH:  Social History   Socioeconomic History   Marital status: Single    Spouse name: Not on file   Number of children: Not on file   Years of education: Not on file   Highest education level: Not on file  Occupational History   Not on file  Tobacco Use   Smoking status: Never   Smokeless tobacco: Never  Vaping Use   Vaping status: Never Used  Substance and Sexual Activity   Alcohol use: Yes   Drug use: No   Sexual activity: Not on file  Other Topics Concern    Not on file  Social History Narrative   The patient is married. He has grown children. He is a lifelong nonsmoker. He works in production designer, theatre/television/film.   Social Drivers of Health   Tobacco Use: Low Risk (01/23/2024)   Patient History    Smoking Tobacco Use: Never    Smokeless Tobacco Use: Never    Passive Exposure: Not on file  Financial Resource Strain: Low Risk  (09/13/2023)   Received from [facility]   Overall Financial Resource Strain (CARDIA)    Difficulty of Paying Living Expenses: Not hard at all  Food Insecurity: Food Insecurity Present (09/13/2023)   Received from [facility]   Epic    Within the past 12 months, you worried that your food would run out before you got the money to buy more.: Sometimes true    Within the past 12 months, the food you bought just didn't last and you didn't have money to get more.: Never true  Transportation Needs: No Transportation Needs (09/13/2023)   Received from [facility] - Transportation    In the past 12 months, has lack of transportation kept you from medical appointments or from getting medications?: No    Lack of Transportation (Non-Medical): No  Physical Activity: Inactive (09/13/2023)   Received from [facility]   Exercise  Vital Sign    On average, how many days per week do you engage in moderate to strenuous exercise (like a brisk walk)?: 0 days    On average, how many minutes do you engage in exercise at this level?: 0 min  Stress: No Stress Concern Present (09/13/2023)   Received from [facility] - Occupational Stress Questionnaire    Feeling of Stress : Not at all  Social Connections: Moderately Isolated (09/13/2023)   Received from [facility]   Social Connection and Isolation Panel    In a typical week, how many times do you talk on the phone with family, friends, or neighbors?:  Once a week    How often do you get together with friends or relatives?: Once a week    How often do you attend church or religious services?: More than 4 times per year    Do you belong to any clubs or organizations such as church groups, unions, fraternal or athletic groups, or school groups?: Yes    How often do you attend meetings of the clubs or organizations you belong to?: More than 4 times per year    Are you married, widowed, divorced, separated, never married, or living with a partner?: Divorced  Intimate Partner Violence: Not on file  Depression (PHQ2-9): Low Risk (07/23/2021)   Depression (PHQ2-9)    PHQ-2 Score: 0  Alcohol Screen: Not on file  Housing: High Risk (09/13/2023)   Received from [facility]   Epic    In the last 12 months, was there a time when you were not able to pay the mortgage or rent on time?: Yes    In the past 12 months, how many times have you moved where you were living?: 0    At any time in the past 12 months, were you homeless or living in a shelter (including now)?: No  Utilities: Not At Risk (09/13/2023)   Received from [facility]   Epic    In the past 12 months has the electric, gas, oil, or water  company threatened to shut off services in your home?: No  Health Literacy: Adequate Health Literacy (09/13/2023)   Received from [facility]   B1300 Health Literacy    How often do you need to have someone help you when you read instructions, pamphlets, or other written material from your doctor or pharmacy?: Never    FH:  Family History  Problem Relation Age of Onset   Healthy Mother        No history of CAD   Healthy Father        No history of CAD   Heart attack Father    Diabetes Brother    Stroke Neg Hx     Past Medical History:  Diagnosis Date   Acute MI, lateral wall, initial episode of care (HCC) 03/23/2014   CAD (coronary artery disease)    a. s/p Lat MI 2/16 >> s/p CABG; b. LHC  1/17: mLAD 100, oD1 85%, oLCx 100, OM1 filled by collats from RPLB2, RPDA stent 95% ISR, L-LAD patent, S-OM1 occluded, S-D1 occluded >> PCI: Promus DES to RPDA  - residual CAD not amenable to PCI (dist vessel/diabetic appearance)    Carotid stenosis    a. Carotid US  2/19 bilat ICA 1-39%   Chronic combined systolic and diastolic CHF (congestive heart failure) (HCC)    Hyperlipidemia 10/16/2014  Hypertension    Ischemic cardiomyopathy    a. Echo at [facility] 3/16:  EF 35-40%, diff HK, mild LVH, mild LAE, mild TR;  b. Echo 7/16: mild LVH, EF 50%, inf and lat HK-AK, Gr 2 DD, mod LAE, mild RVE, reduced RVSF, trivial TR, PASP 30 mmHg // c. Echo 5/17: moderate LVH, EF 45-50%, anteroseptal HK, grade 1 diastolic dysfunction, aortic sclerosis    Type 2 diabetes mellitus (HCC) 03/23/2014    Current Outpatient Medications  Medication Sig Dispense Refill   albuterol  (VENTOLIN  HFA) 108 (90 Base) MCG/ACT inhaler Inhale 1-2 puffs into the lungs every 6 (six) hours as needed for wheezing or shortness of breath.     atorvastatin  (LIPITOR ) 20 MG tablet Take 1 tablet (20 mg total) by mouth daily. 30 tablet 3   Cholecalciferol (VITAMIN D3) 50 MCG (2000 UT) capsule Take 5,000 Units by mouth daily.     clopidogrel  (PLAVIX ) 75 MG tablet Take 1 tablet (75 mg total) by mouth daily. 90 tablet 3   ezetimibe  (ZETIA ) 10 MG tablet Take 1 tablet (10 mg total) by mouth daily. 90 tablet 3   Glycerin-Hypromellose-PEG 400 ([facility] DRY EYE RELIEF) 0.2-0.2-1 % SOLN Place 1 drop into both eyes daily as needed (dry eyes).     HUMALOG KWIKPEN 200 UNIT/ML KwikPen Inject 206 Units into the skin as directed. (Administered via insulin  pump)     ibuprofen (ADVIL) 200 MG tablet Take 200 mg by mouth every 6 (six) hours as needed for headache or mild pain.     ipratropium-albuterol  (DUONEB) 0.5-2.5 (3) MG/3ML SOLN Take 3 mLs by nebulization every 2 (two) hours as needed. 360 mL 0   isosorbide  mononitrate (IMDUR ) 30 MG 24 hr tablet TAKE 1 TABLET BY  MOUTH EVERY DAY 90 tablet 2   magnesium  oxide (MAG-OX) 400 (240 Mg) MG tablet Take 400 mg by mouth daily.     meloxicam (MOBIC) 15 MG tablet Take 15 mg by mouth daily.     nitroGLYCERIN  (NITROSTAT ) 0.4 MG SL tablet Place 1 tablet (0.4 mg total) under the tongue every 5 (five) minutes as needed for chest pain. 25 tablet 2   pantoprazole  (PROTONIX ) 40 MG tablet Take 40 mg by mouth daily.     potassium chloride  SA (KLOR-CON  M20) 20 MEQ tablet Take 2 tablets (40 mEq total) by mouth daily. 60 tablet 11   prednisoLONE acetate (PRED FORTE) 1 % ophthalmic suspension Place 1 drop into the left eye 4 (four) times daily.     torsemide  (DEMADEX ) 20 MG tablet Take 40 mg by mouth daily. (Patient taking differently: Take 20 mg by mouth daily.)     valsartan  (DIOVAN ) 160 MG tablet TAKE 1 TABLET BY MOUTH EVERY DAY 90 tablet 3   No current facility-administered medications for this visit.      PHYSICAL EXAM:  General: Well appearing.  Cor: No JVD. Regular rhythm, rate.  Lungs: clear Abdomen: soft, nontender, nondistended. Extremities: 1+ pitting edema right lower leg, trace pitting left lower leg Neuro:. Affect pleasant   ECG:  not done    ASSESSMENT & PLAN:  1: Chronic ischemic heart failure with preserved ejection fraction- - cath shows CAD - NYHA class II - euvolemic - weight 288.2 pounds from last visit here 6 weeks ago - Echo 03/23/14: EF 50% with mild LVH, mild MR/TR - Echo 08/26/14: EF 50% with mild LVH, Grade II DD, moderate LAE - Echo 06/18/15: EF 45-50% with moderate LVH, trivial MR - Echo 07/07/17: EF 50-55% with Grade  I DD, mild LVH - Echo 09/08/20: EF >55% with Grade I DD - Echo 12/13/20: EF of 55-60% along with moderate LVH and trivial MR.  - Echo 06/14/22: EF 50-55% along with mild MR.  - does add salt to eggs but says that's about it - continue torsemide  20mg  daily - continue Klor-con  40meq QD - continue valsartan  160mg  daily - Type 1 diabetic so not a candidate for SGLT2 -  spironolactone  caused creatinine to rise - BNP 12/12/20 was 2230   2: HTN- - BP  - saw PCP 07/25 - BMP 01/23/24 reviewed: sodium 139, potassium 4.0, creatinine 1.37 & GFR 57  3: Type 1 DM- - saw endocrinology Leatrice) 11/25 - A1c 01/02/24 was 7.2% - insurance will no longer cover GLP1  4: CAD- - saw cardiology Florestine) 12/25 - continue isosorbide  MN 30mg  daily - continue clopidogrel  75mg  daily - continue ASA 81mg  daily - LHC done 12/12/20: Severe two-vessel coronary artery disease with chronic total occlusions of the mid LAD and ostial LCx, as noted on prior catheterizations. Mild, nonobstructive RCA disease with patent PDA stent. Widely patent LIMA-LAD bypass graft.  SVG-D1 and SVG-OM1 are known to be occluded and were not engaged on today's study. Mildly elevated left ventricular filling pressure (LVEDP 15-20 mmHg) with grossly normal left ventricular contraction.  5: HLD- - will stop rosuvastatin   - unable to tolerate lipitor  - LDL 01/23/24 was 71 - zetia  10mg  daily was started 12/25     Ellouise DELENA Class, FNP 03/04/24 "

## 2024-03-05 ENCOUNTER — Encounter: Payer: Self-pay | Admitting: Family

## 2024-03-05 ENCOUNTER — Ambulatory Visit: Attending: Family | Admitting: Family

## 2024-03-05 VITALS — BP 141/77 | HR 71 | Wt 285.2 lb

## 2024-03-05 DIAGNOSIS — E109 Type 1 diabetes mellitus without complications: Secondary | ICD-10-CM | POA: Insufficient documentation

## 2024-03-05 DIAGNOSIS — I2510 Atherosclerotic heart disease of native coronary artery without angina pectoris: Secondary | ICD-10-CM | POA: Insufficient documentation

## 2024-03-05 DIAGNOSIS — I110 Hypertensive heart disease with heart failure: Secondary | ICD-10-CM | POA: Insufficient documentation

## 2024-03-05 DIAGNOSIS — Z79899 Other long term (current) drug therapy: Secondary | ICD-10-CM | POA: Insufficient documentation

## 2024-03-05 DIAGNOSIS — Z955 Presence of coronary angioplasty implant and graft: Secondary | ICD-10-CM | POA: Diagnosis not present

## 2024-03-05 DIAGNOSIS — E785 Hyperlipidemia, unspecified: Secondary | ICD-10-CM | POA: Insufficient documentation

## 2024-03-05 DIAGNOSIS — I10 Essential (primary) hypertension: Secondary | ICD-10-CM | POA: Diagnosis not present

## 2024-03-05 DIAGNOSIS — N1832 Chronic kidney disease, stage 3b: Secondary | ICD-10-CM

## 2024-03-05 DIAGNOSIS — I5032 Chronic diastolic (congestive) heart failure: Secondary | ICD-10-CM | POA: Insufficient documentation

## 2024-03-05 DIAGNOSIS — E782 Mixed hyperlipidemia: Secondary | ICD-10-CM | POA: Diagnosis not present

## 2024-03-05 DIAGNOSIS — Z794 Long term (current) use of insulin: Secondary | ICD-10-CM | POA: Insufficient documentation

## 2024-03-05 DIAGNOSIS — Z5941 Food insecurity: Secondary | ICD-10-CM | POA: Diagnosis not present

## 2024-03-05 DIAGNOSIS — E1022 Type 1 diabetes mellitus with diabetic chronic kidney disease: Secondary | ICD-10-CM

## 2024-03-05 DIAGNOSIS — I255 Ischemic cardiomyopathy: Secondary | ICD-10-CM | POA: Insufficient documentation

## 2024-03-05 DIAGNOSIS — Z951 Presence of aortocoronary bypass graft: Secondary | ICD-10-CM | POA: Diagnosis not present

## 2024-03-05 DIAGNOSIS — I25118 Atherosclerotic heart disease of native coronary artery with other forms of angina pectoris: Secondary | ICD-10-CM | POA: Diagnosis not present

## 2024-03-05 DIAGNOSIS — Z7902 Long term (current) use of antithrombotics/antiplatelets: Secondary | ICD-10-CM | POA: Diagnosis not present

## 2024-03-05 DIAGNOSIS — R5383 Other fatigue: Secondary | ICD-10-CM | POA: Diagnosis present

## 2024-03-05 NOTE — Patient Instructions (Signed)
 Medication Changes:  No medication changes today!   Testing/Procedures:  Your physician has requested that you have an echocardiogram. Echocardiography is a painless test that uses sound waves to create images of your heart. It provides your doctor with information about the size and shape of your heart and how well your hearts chambers and valves are working. This procedure takes approximately one hour. There are no restrictions for this procedure. Please do NOT wear cologne, perfume, aftershave, or lotions (deodorant is allowed). Please arrive 15 minutes prior to your appointment time.  Please note: We ask at that you not bring children with you during ultrasound (echo/ vascular) testing. Due to room size and safety concerns, children are not allowed in the ultrasound rooms during exams. Our front office staff cannot provide observation of children in our lobby area while testing is being conducted. An adult accompanying a patient to their appointment will only be allowed in the ultrasound room at the discretion of the ultrasound technician under special circumstances. We apologize for any inconvenience.  Someone will contact you in order to schedule your appointment.  Follow-Up in: Please follow up with the Advanced Heart Failure Clinic in 2 months with Ellouise Class, FNP.   Thank you for choosing [facility].    At the Advanced Heart Failure Clinic, you and your health needs are our priority. We have a designated team specialized in the treatment of Heart Failure. This Care Team includes your primary Heart Failure Specialized Cardiologist (physician), Advanced Practice Providers (APPs- Physician Assistants and Nurse Practitioners), and Pharmacist who all work together to provide you with the care you need, when you need it.   You may see any of the following providers on your designated Care Team at your next follow up:  Dr. Toribio Fuel Dr.  Ezra Shuck Dr. Ria Commander Dr. Morene Brownie Ellouise Class, FNP Jaun Bash, RPH-CPP  Please be sure to bring in all your medications bottles to every appointment.   Need to Contact Us :  If you have any questions or concerns before your next appointment please send us  a message through [place] or call our office at [phone].    TO LEAVE A MESSAGE FOR THE NURSE SELECT OPTION 2, PLEASE LEAVE A MESSAGE INCLUDING: YOUR NAME DATE OF BIRTH CALL BACK NUMBER REASON FOR CALL**this is important as we prioritize the call backs  YOU WILL RECEIVE A CALL BACK THE SAME DAY AS LONG AS YOU CALL BEFORE 4:00 PM

## 2024-03-22 ENCOUNTER — Ambulatory Visit

## 2024-03-22 DIAGNOSIS — I5032 Chronic diastolic (congestive) heart failure: Secondary | ICD-10-CM

## 2024-03-22 LAB — ECHOCARDIOGRAM COMPLETE
AR max vel: 2.48 cm2
AV Area VTI: 2.4 cm2
AV Area mean vel: 2.48 cm2
AV Mean grad: 2 mmHg
AV Peak grad: 4.1 mmHg
Ao pk vel: 1.01 m/s
Area-P 1/2: 3.77 cm2
S' Lateral: 3.86 cm

## 2024-03-22 MED ORDER — PERFLUTREN LIPID MICROSPHERE
1.0000 mL | INTRAVENOUS | Status: AC | PRN
  Administered 2024-03-22: 2 mL via INTRAVENOUS

## 2024-04-30 ENCOUNTER — Ambulatory Visit: Admitting: Family
